# Patient Record
Sex: Female | Born: 1945 | Race: White | Hispanic: No | Smoking: Former smoker
Health system: Southern US, Community
[De-identification: ages and names within clinical notes are randomized; demographics above are authoritative.]

## PROBLEM LIST (undated history)

## (undated) DIAGNOSIS — Z8719 Personal history of other diseases of the digestive system: Secondary | ICD-10-CM

## (undated) DIAGNOSIS — R42 Dizziness and giddiness: Secondary | ICD-10-CM

## (undated) DIAGNOSIS — J449 Chronic obstructive pulmonary disease, unspecified: Secondary | ICD-10-CM

## (undated) DIAGNOSIS — R233 Spontaneous ecchymoses: Secondary | ICD-10-CM

## (undated) DIAGNOSIS — I209 Angina pectoris, unspecified: Secondary | ICD-10-CM

## (undated) DIAGNOSIS — R0600 Dyspnea, unspecified: Secondary | ICD-10-CM

## (undated) DIAGNOSIS — Z923 Personal history of irradiation: Secondary | ICD-10-CM

## (undated) DIAGNOSIS — T4145XA Adverse effect of unspecified anesthetic, initial encounter: Secondary | ICD-10-CM

## (undated) DIAGNOSIS — M1990 Unspecified osteoarthritis, unspecified site: Secondary | ICD-10-CM

## (undated) DIAGNOSIS — E785 Hyperlipidemia, unspecified: Secondary | ICD-10-CM

## (undated) DIAGNOSIS — F419 Anxiety disorder, unspecified: Secondary | ICD-10-CM

## (undated) DIAGNOSIS — D699 Hemorrhagic condition, unspecified: Secondary | ICD-10-CM

## (undated) DIAGNOSIS — J189 Pneumonia, unspecified organism: Secondary | ICD-10-CM

## (undated) DIAGNOSIS — R1310 Dysphagia, unspecified: Secondary | ICD-10-CM

## (undated) DIAGNOSIS — T8859XA Other complications of anesthesia, initial encounter: Secondary | ICD-10-CM

## (undated) DIAGNOSIS — G4730 Sleep apnea, unspecified: Secondary | ICD-10-CM

## (undated) DIAGNOSIS — K259 Gastric ulcer, unspecified as acute or chronic, without hemorrhage or perforation: Secondary | ICD-10-CM

## (undated) DIAGNOSIS — Z8619 Personal history of other infectious and parasitic diseases: Secondary | ICD-10-CM

## (undated) DIAGNOSIS — I10 Essential (primary) hypertension: Secondary | ICD-10-CM

## (undated) DIAGNOSIS — C801 Malignant (primary) neoplasm, unspecified: Secondary | ICD-10-CM

## (undated) DIAGNOSIS — IMO0002 Reserved for concepts with insufficient information to code with codable children: Secondary | ICD-10-CM

## (undated) DIAGNOSIS — R238 Other skin changes: Secondary | ICD-10-CM

## (undated) DIAGNOSIS — E119 Type 2 diabetes mellitus without complications: Secondary | ICD-10-CM

## (undated) DIAGNOSIS — K2270 Barrett's esophagus without dysplasia: Secondary | ICD-10-CM

## (undated) DIAGNOSIS — M5136 Other intervertebral disc degeneration, lumbar region: Secondary | ICD-10-CM

## (undated) DIAGNOSIS — K760 Fatty (change of) liver, not elsewhere classified: Secondary | ICD-10-CM

## (undated) DIAGNOSIS — J329 Chronic sinusitis, unspecified: Secondary | ICD-10-CM

## (undated) DIAGNOSIS — M51369 Other intervertebral disc degeneration, lumbar region without mention of lumbar back pain or lower extremity pain: Secondary | ICD-10-CM

## (undated) DIAGNOSIS — K219 Gastro-esophageal reflux disease without esophagitis: Secondary | ICD-10-CM

## (undated) DIAGNOSIS — C50919 Malignant neoplasm of unspecified site of unspecified female breast: Secondary | ICD-10-CM

## (undated) DIAGNOSIS — E7800 Pure hypercholesterolemia, unspecified: Secondary | ICD-10-CM

## (undated) DIAGNOSIS — I2510 Atherosclerotic heart disease of native coronary artery without angina pectoris: Secondary | ICD-10-CM

## (undated) HISTORY — PX: UMBILICAL HERNIA REPAIR: SHX196

## (undated) HISTORY — PX: ABDOMINAL HYSTERECTOMY: SHX81

## (undated) HISTORY — PX: UVULOPALATOPHARYNGOPLASTY: SHX827

## (undated) HISTORY — PX: CHOLECYSTECTOMY: SHX55

## (undated) HISTORY — DX: Chronic sinusitis, unspecified: J32.9

## (undated) HISTORY — PX: CORONARY ANGIOPLASTY: SHX604

---

## 1965-09-10 HISTORY — PX: LAPAROSCOPIC TUBAL LIGATION: SUR803

## 1978-09-10 HISTORY — PX: BUNIONECTOMY: SHX129

## 1983-09-11 HISTORY — PX: BREAST BIOPSY: SHX20

## 1999-09-11 HISTORY — PX: UVULOPALATOPHARYNGOPLASTY: SHX827

## 2004-07-10 ENCOUNTER — Ambulatory Visit: Payer: Self-pay | Admitting: Internal Medicine

## 2005-07-17 ENCOUNTER — Ambulatory Visit: Payer: Self-pay | Admitting: Internal Medicine

## 2005-10-23 ENCOUNTER — Ambulatory Visit: Payer: Self-pay | Admitting: Unknown Physician Specialty

## 2005-11-02 ENCOUNTER — Ambulatory Visit: Payer: Self-pay | Admitting: Unknown Physician Specialty

## 2005-12-28 ENCOUNTER — Ambulatory Visit: Payer: Self-pay | Admitting: Unknown Physician Specialty

## 2006-10-03 ENCOUNTER — Ambulatory Visit: Payer: Self-pay | Admitting: Internal Medicine

## 2007-01-09 ENCOUNTER — Ambulatory Visit: Payer: Self-pay | Admitting: Unknown Physician Specialty

## 2007-01-13 ENCOUNTER — Ambulatory Visit: Payer: Self-pay | Admitting: Unknown Physician Specialty

## 2008-01-28 ENCOUNTER — Ambulatory Visit: Payer: Self-pay | Admitting: Obstetrics and Gynecology

## 2009-02-09 ENCOUNTER — Ambulatory Visit: Payer: Self-pay | Admitting: Obstetrics and Gynecology

## 2009-05-18 ENCOUNTER — Emergency Department: Payer: Self-pay | Admitting: Unknown Physician Specialty

## 2010-02-23 ENCOUNTER — Ambulatory Visit: Payer: Self-pay | Admitting: Obstetrics and Gynecology

## 2010-03-07 ENCOUNTER — Ambulatory Visit: Payer: Self-pay | Admitting: Obstetrics and Gynecology

## 2010-09-10 DIAGNOSIS — J449 Chronic obstructive pulmonary disease, unspecified: Secondary | ICD-10-CM

## 2010-09-10 HISTORY — DX: Chronic obstructive pulmonary disease, unspecified: J44.9

## 2011-02-26 ENCOUNTER — Ambulatory Visit: Payer: Self-pay | Admitting: Obstetrics and Gynecology

## 2011-03-30 ENCOUNTER — Ambulatory Visit: Payer: Self-pay | Admitting: Unknown Physician Specialty

## 2012-02-27 ENCOUNTER — Ambulatory Visit: Payer: Self-pay | Admitting: Obstetrics and Gynecology

## 2012-08-22 ENCOUNTER — Ambulatory Visit: Payer: Self-pay | Admitting: Internal Medicine

## 2012-08-22 LAB — CREATININE, SERUM: EGFR (Non-African Amer.): >60

## 2012-09-09 ENCOUNTER — Emergency Department: Payer: Self-pay | Admitting: Emergency Medicine

## 2012-09-09 LAB — CBC
HGB: 13.3 g/dL (ref 12.0–16.0)
MCH: 30.6 pg (ref 26.0–34.0)
MCHC: 33.9 g/dL (ref 32.0–36.0)
MCV: 90 fL (ref 80–100)
Platelet: 191 10*3/uL (ref 150–440)
RBC: 4.36 10*6/uL (ref 3.80–5.20)

## 2012-09-09 LAB — URINALYSIS, COMPLETE: Protein: NEGATIVE

## 2012-09-09 LAB — BASIC METABOLIC PANEL
Anion Gap: 6 — ABNORMAL LOW (ref 7–16)
Calcium, Total: 8.3 mg/dL — ABNORMAL LOW (ref 8.5–10.1)
Chloride: 101 mmol/L (ref 98–107)
Co2: 27 mmol/L (ref 21–32)
EGFR (African American): >60
Osmolality: 274 (ref 275–301)

## 2012-09-10 DIAGNOSIS — C50919 Malignant neoplasm of unspecified site of unspecified female breast: Secondary | ICD-10-CM

## 2012-09-10 DIAGNOSIS — C801 Malignant (primary) neoplasm, unspecified: Secondary | ICD-10-CM

## 2012-09-10 DIAGNOSIS — IMO0001 Reserved for inherently not codable concepts without codable children: Secondary | ICD-10-CM

## 2012-09-10 HISTORY — PX: BREAST LUMPECTOMY: SHX2

## 2012-09-10 HISTORY — DX: Reserved for inherently not codable concepts without codable children: IMO0001

## 2012-09-10 HISTORY — DX: Malignant neoplasm of unspecified site of unspecified female breast: C50.919

## 2012-09-10 HISTORY — PX: BREAST EXCISIONAL BIOPSY: SUR124

## 2012-09-10 HISTORY — DX: Malignant (primary) neoplasm, unspecified: C80.1

## 2012-09-25 ENCOUNTER — Other Ambulatory Visit: Payer: Self-pay | Admitting: Unknown Physician Specialty

## 2012-09-25 DIAGNOSIS — Z8371 Family history of colonic polyps: Secondary | ICD-10-CM

## 2012-09-25 DIAGNOSIS — K5730 Diverticulosis of large intestine without perforation or abscess without bleeding: Secondary | ICD-10-CM

## 2012-09-25 DIAGNOSIS — Z800 Family history of malignant neoplasm of digestive organs: Secondary | ICD-10-CM

## 2013-04-27 ENCOUNTER — Ambulatory Visit: Payer: Self-pay

## 2013-05-05 ENCOUNTER — Ambulatory Visit: Payer: Self-pay

## 2013-05-07 ENCOUNTER — Ambulatory Visit: Payer: Self-pay

## 2013-05-14 ENCOUNTER — Ambulatory Visit: Payer: Self-pay | Admitting: Oncology

## 2013-05-15 LAB — PATHOLOGY REPORT

## 2013-05-28 ENCOUNTER — Ambulatory Visit: Payer: Self-pay | Admitting: Surgery

## 2013-05-28 LAB — BASIC METABOLIC PANEL
Anion Gap: 1 — ABNORMAL LOW (ref 7–16)
Calcium, Total: 8.9 mg/dL (ref 8.5–10.1)
Co2: 30 mmol/L (ref 21–32)
EGFR (African American): >60
EGFR (Non-African Amer.): >60
Potassium: 4.5 mmol/L (ref 3.5–5.1)
Sodium: 135 mmol/L — ABNORMAL LOW (ref 136–145)

## 2013-05-28 LAB — HEMOGLOBIN: HGB: 13.1 g/dL (ref 12.0–16.0)

## 2013-06-01 ENCOUNTER — Ambulatory Visit: Payer: Self-pay | Admitting: Surgery

## 2013-06-02 LAB — PATHOLOGY REPORT

## 2013-06-04 ENCOUNTER — Ambulatory Visit: Payer: Self-pay | Admitting: Surgery

## 2013-06-08 LAB — PATHOLOGY REPORT

## 2013-06-10 ENCOUNTER — Ambulatory Visit: Payer: Self-pay | Admitting: Oncology

## 2013-07-11 ENCOUNTER — Ambulatory Visit: Payer: Self-pay | Admitting: Oncology

## 2013-07-17 ENCOUNTER — Encounter: Payer: Self-pay | Admitting: Internal Medicine

## 2013-07-17 ENCOUNTER — Ambulatory Visit: Payer: Self-pay | Admitting: Specialist

## 2013-07-31 LAB — CBC CANCER CENTER
Basophil #: 0.1 x10 3/mm (ref 0.0–0.1)
Basophil %: 1 %
Eosinophil %: 3 %
HCT: 41.9 % (ref 35.0–47.0)
HGB: 13.7 g/dL (ref 12.0–16.0)
Lymphocyte %: 32.3 %
Monocyte #: 0.5 x10 3/mm (ref 0.2–0.9)
Monocyte %: 7 %
Neutrophil #: 4.3 x10 3/mm (ref 1.4–6.5)
RBC: 4.62 10*6/uL (ref 3.80–5.20)
RDW: 13.8 % (ref 11.5–14.5)
WBC: 7.6 x10 3/mm (ref 3.6–11.0)

## 2013-08-10 ENCOUNTER — Ambulatory Visit: Payer: Self-pay | Admitting: Oncology

## 2013-08-21 LAB — CBC CANCER CENTER
Basophil %: 0.9 %
Eosinophil #: 0.3 x10 3/mm (ref 0.0–0.7)
HCT: 37.4 % (ref 35.0–47.0)
HGB: 12.3 g/dL (ref 12.0–16.0)
Lymphocyte #: 1.1 x10 3/mm (ref 1.0–3.6)
Lymphocyte %: 19.3 %
MCV: 91 fL (ref 80–100)
Monocyte #: 0.4 x10 3/mm (ref 0.2–0.9)
Neutrophil #: 3.9 x10 3/mm (ref 1.4–6.5)
Neutrophil %: 68.6 %
Platelet: 184 x10 3/mm (ref 150–440)
RBC: 4.1 10*6/uL (ref 3.80–5.20)
RDW: 14.1 % (ref 11.5–14.5)
WBC: 5.7 x10 3/mm (ref 3.6–11.0)

## 2013-08-28 LAB — CBC CANCER CENTER
HCT: 38.9 % (ref 35.0–47.0)
Lymphocyte #: 1.1 x10 3/mm (ref 1.0–3.6)
Lymphocyte %: 18 %
MCHC: 33.2 g/dL (ref 32.0–36.0)
Monocyte #: 0.5 x10 3/mm (ref 0.2–0.9)
Neutrophil %: 70.9 %
Platelet: 191 x10 3/mm (ref 150–440)
RBC: 4.31 10*6/uL (ref 3.80–5.20)

## 2013-09-10 ENCOUNTER — Ambulatory Visit: Payer: Self-pay | Admitting: Oncology

## 2013-10-11 ENCOUNTER — Ambulatory Visit: Payer: Self-pay | Admitting: Oncology

## 2013-11-30 ENCOUNTER — Ambulatory Visit: Payer: Self-pay | Admitting: Oncology

## 2013-12-09 ENCOUNTER — Ambulatory Visit: Payer: Self-pay | Admitting: Oncology

## 2014-01-08 ENCOUNTER — Ambulatory Visit: Payer: Self-pay | Admitting: Oncology

## 2014-01-08 LAB — CANCER ANTIGEN 27.29: CA 27.29: 13.4 U/mL (ref 0.0–38.6)

## 2014-02-18 ENCOUNTER — Ambulatory Visit: Payer: Self-pay | Admitting: Oncology

## 2014-03-10 ENCOUNTER — Ambulatory Visit: Payer: Self-pay | Admitting: Oncology

## 2014-04-12 ENCOUNTER — Ambulatory Visit: Payer: Self-pay | Admitting: Unknown Physician Specialty

## 2014-04-13 LAB — PATHOLOGY REPORT

## 2014-05-12 ENCOUNTER — Ambulatory Visit: Payer: Self-pay | Admitting: Oncology

## 2014-06-29 ENCOUNTER — Ambulatory Visit: Payer: Self-pay | Admitting: Rheumatology

## 2014-07-09 ENCOUNTER — Ambulatory Visit: Payer: Self-pay | Admitting: Oncology

## 2014-07-11 ENCOUNTER — Ambulatory Visit: Payer: Self-pay | Admitting: Oncology

## 2014-07-27 DIAGNOSIS — M7541 Impingement syndrome of right shoulder: Secondary | ICD-10-CM | POA: Insufficient documentation

## 2014-09-16 ENCOUNTER — Ambulatory Visit: Payer: Self-pay | Admitting: Radiation Oncology

## 2014-11-11 ENCOUNTER — Ambulatory Visit
Admit: 2014-11-11 | Disposition: A | Discharge status: Home / Self Care | Payer: Self-pay | Attending: Oncology | Admitting: Oncology

## 2014-11-11 ENCOUNTER — Ambulatory Visit: Payer: Self-pay | Admitting: Oncology

## 2014-12-10 ENCOUNTER — Ambulatory Visit
Admit: 2014-12-10 | Disposition: A | Discharge status: Home / Self Care | Payer: Self-pay | Attending: Oncology | Admitting: Oncology

## 2014-12-31 NOTE — Consult Note (Signed)
Reason for Visit: This 69 year old Female patient presents to the clinic for initial evaluation of  breast cancer .   Referred by Dr. Grayland Ormond.  Diagnosis:  Chief Complaint/Diagnosis   69 year old female presented with abnormal microcalcifications of the right breast status post wide local excision and sentinel node biopsy for a stage I a (T1 B. p N1 miM0 invasive mammary carcinoma ER/PR positive HER-2/neu not overexpressed. Patient has a recurrence risk of 13 based on Oncotype DX  Pathology Report pathology report reviewed   Imaging Report mammograms reviewed   Referral Report clinical notes reviewed   Planned Treatment Regimen whole breast and peripheral lymphatic radiation   HPI   patient is a 69 year old female who presented withsuspicious spiculated mass in the upper inner quadrant of the right breast as well as fine punctate microcalcifications in the right lower inner quadrant.she had an ultrasound-guided biopsy on August 28 showing invasive mammary carcinoma ER/PR positive HER-2/neu negative. By ultrasound node measuring approximately 1 cm. She wants have a wide local excision for a 1 cm focus of invasive mammary carcinoma. 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 Margins for invasive mammary carcinoma were clear DCIS was close at 1 mm. Patient has been seen by medical oncology and based on her low Oncotype DX which was 13 they have deferred systemic chemotherapy. She will be on aromatase inhibitor after completion of radiation. She seen today in doing well. She specifically denies breast tenderness cough or bone pain at this time.  Past Hx:    rectal fissures:    Hyperlipidemia:    barrett's esophagus:    sleep apnea:    Osteoarthritis:    chronic sinusitis:    h pylori:    Diabetes:    hernia repair:    external pharyngeal repair for sleep apnea:    Tubal Ligation:    Cholecystectomy:    Hysterectomy - Total:   Past, Family and  Social History:  Past Medical History positive   Cardiovascular hyperlipidemia   Respiratory sleep apnea, chronic sinusitis   Gastrointestinal rectal fissures, H. pylori,Barrett's esophagus   Endocrine diabetes mellitus   Past Surgical History cholecystectomy; total hysterectomy, tubal ligation, external pharyngeal repair for sleep apnea, herniorrhaphy repair   Past Medical History Comments osteoarthritis   Family History positive   Family History Comments family history of adult onset diabetes, hypertension, ulcer, stroke and colon cancer   Social History noncontributory   Additional Past Medical and Surgical History seen by yourself. 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   Allergies:   Prozac: Alt Ment Status  Sulfa drugs: Headaches  Macrobid: GI Distress  Tape: Rash  Metformin HCL-Pioglitazone HCL: Other  [place] Prefilled Pen: Other  Neurontin: Other  Simvastatin: Other  Toradol: Unknown  Ceclor: Unknown  Home Meds:  Home Medications: Medication Instructions Status  acetaminophen 325 mg oral tablet 1-2 tab(s) orally every 4 hours, As needed for pain Active  Norco 325 mg-5 mg oral tablet  orally 1 to 2 every 4 hours as needed for pain Active  glipiZIDE extended release 10 mg oral tablet, extended release 1 tab(s) orally once a day (in the morning) Active  NovoLIN 70/30 human recombinant 70 units-30 units/mL subcutaneous suspension 40-50 unit(s) subcutaneous once a day (in the morning) at 1000. Then 30 units at 5PM Active  PARoxetine 40 mg oral tablet 1 tab(s) orally once a day (in the morning) Active  omeprazole 20 mg oral delayed release  capsule 1 cap(s) orally 2 times a day Active  benazepril 20 mg oral tablet 1 tab(s) orally once a day (in the morning) Active  loratadine 10 mg oral tablet 1 tab(s) orally once a day (in the morning) Active   Review of Systems:  General negative   Performance Status (ECOG) 0   Skin negative    Breast see HPI   Ophthalmologic negative   ENMT negative   Respiratory and Thorax negative   Cardiovascular negative   Gastrointestinal negative   Genitourinary negative   Musculoskeletal negative   Neurological negative   Psychiatric negative   Hematology/Lymphatics negative   Endocrine negative   Allergic/Immunologic negative   Review of Systems   review of systems obtained by nursing  Nursing Notes:  Nursing Vital Signs and Chemo Nursing Nursing Notes: *CC Vital Signs Flowsheet:   23-Oct-14 11:25  Temp Temperature 97.6  Pulse Pulse 92  Respirations Respirations 18  SBP SBP 131  DBP DBP 77  Pain Scale (0-10)  0  Current Weight (kg) (kg) 97.8  Height (cm) centimeters 163  BSA (m2) 2   Physical Exam:  General/Skin/HEENT:  Skin normal   Eyes normal   ENMT normal   Head and Neck normal   Additional PE well-developed slightly obese female in NAD. Lungs are clear to A&P cardiac examination shows regular rate and rhythm. She has a wide local excision site of the right breast which is healing well. There is some retraction of the breast towards the scar. No dominant mass or nodularity is noted in either breast into position examined. No axillary or supraclavicular adenopathy is identified.   Breasts/Resp/CV/GI/GU:  Respiratory and Thorax normal   Cardiovascular normal   Gastrointestinal normal   Genitourinary normal   MS/Neuro/Psych/Lymph:  Musculoskeletal normal   Neurological normal   Lymphatics normal   Other Results:  Radiology Results: LabUnknown:    26-Aug-14 14:45, Digital Additional Views Rt Breast (SCR)  PACS Image     26-Aug-14 16:28, MAM Ultrasound  Right  PACS [place]:    26-Aug-14 14:45, Digital Additional Views Rt Breast (SCR)  Digital Additional Views Rt Breast (SCR)   REASON FOR EXAM:    av rt calcifications  COMMENTS:       PROCEDURE: MAM - MAM DIG ADDVIEWS RT SCR  - May 05 2013  2:45PM      *RADIOLOGY REPORT*    Clinical Data:  The patient returns for evaluation of  calcifications in the medial aspect of the right breast noted on  recent screening study dated 04/27/2013.    DIGITAL DIAGNOSTIC RIGHT MAMMOGRAM  AND RIGHT BREAST ULTRASOUND:    Comparison:  02/27/2012, 02/26/2011, 02/23/2010, 02/09/2009,  01/28/2008  Findings:    ACR Breast Density Category b:  There are scattered areas of  fibroglandular density.    Magnification views demonstrate fine punctate microcalcifications  in the right lower inner quadrant.     In addition, there is a spiculated mass in the right upper inner  quadrant.    On physical exam, no mass is palpated in the medial half of the  right breast.    Ultrasound is performed, showing an irregular solid mass at 2  o'clock 5 cm from the right nipple measuring 0.8 x 0.8 x 1.2 cm.  No abnormal lymph nodes are noted in the right axilla.    The spiculated mass is highly suspicious for invasive mammary  carcinoma.  I would suggest ultrasound-guided core needle biopsy of  this  mass.  If this biopsy is positive for carcinoma, one may wish  to consider breast MRI and possible stereotactic core needle biopsy  of the calcifications.     IMPRESSION:    1.  Suspicious spiculated mass right upper inner quadrant.  2.  Fine punctate microcalcifications in the right lower inner  quadrant.  RECOMMENDATION:  Suggest ultrasound-guided core needle biopsy of the mass.  This has  been scheduled for a convenient time for the patient.  Pending  those results, breast MRI and possible stereotactic core needle  biopsy may be helpful.    I have discussed the findings and recommendations with thepatient.  Results were also provided in writing at the conclusion of the  visit.  If applicable, a reminder letter will be sent to the  patient regarding the next appointment.    BI-RADS CATEGORY 5:  Highly suggestive of malignancy - appropriate  action should  be taken.    Original Report Authenticated By: Ulyess Blossom, M.D.         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 Grayland Ormond. He is deferring chemotherapy based on Oncotype DX low risk of recurrence. Patient has had only sentinel node which had micrometastatic so I would plan on delivering 5000 cGy to her right breast and peripheral lymphatics. Also boost or scar another 2000 cGy based on the close margin. Risks and benefits of treatment early skin reaction, fatigue, inclusion of some superficial lung and possible alteration of blood counts were all explained to the patient in detail. I've also explained to her the need to exercise her right upper extremity based on that we will be including her peripheral lymphatics and our treatment field. Patient [place] treatment plan well I have set her up for CT simulation next week. Patient will also be a candidate for aromatase inhibitor after completion of radiation.  I would like to take this opportunity to thank you for allowing me to continue to participate in this patient's care.  CC Referral:  cc: Dr. Tamala Julian, Dr. Ola Spurr   Electronic Signatures: Baruch Gouty, Roda Shutters (MD)  (Signed 23-Oct-14 12:24)  Authored: HPI, Diagnosis, Past Hx, PFSH, Allergies, Home Meds, ROS, Nursing Notes, Physical Exam, Other Results, Relevent Results, Encounter Assessment and Plan, CC Referring Physician   Last Updated: 23-Oct-14 12:24 by Armstead Peaks (MD)

## 2014-12-31 NOTE — Op Note (Signed)
PATIENT NAME:  Tammy Hatfield, Tammy Hatfield MR#:  [id number] DATE OF BIRTH:  February 24, 1946  DATE OF PROCEDURE:  06/04/2013  PREOPERATIVE DIAGNOSIS: Carcinoma of the right breast.   POSTOPERATIVE DIAGNOSIS: Carcinoma of the right breast.   PROCEDURE: Right partial mastectomy with axillary sentinel lymph node biopsy.   SURGEON: Loreli Dollar, MD  ANESTHESIA: General.   INDICATIONS: This 69 year old female had recent mammogram demonstrating a mass at the upper inner quadrant of the right breast. She had biopsy findings of infiltrating mammary carcinoma. She also had some other adjacent microcalcifications. She also had a more recent biopsy of another cluster of microcalcifications in the inferior aspect of the right breast which was benign. She had preoperative injection of radioactive technetium sulfur colloid, preoperative x-ray needle localization marking the cancer biopsy site and some adjacent microcalcifications, so that 2 Kopans wires were used as a bracket.   The patient was placed on the operating table in the supine position under general anesthesia. The dressing was removed from the upper inner quadrant of the right breast, exposing the 2 Kopans wires. These wires were cut 2 cm from the skin. The right arm was placed on a lateral arm support. Some adhesive was removed from the skin. The breast and axilla were prepared with ChloraPrep and draped in a sterile manner. Mammograms were reviewed prior to beginning surgery.   The gamma counter was used to demonstrate the location of radioactivity in the inferior aspect of the right axilla. An oblique incision was made some 5 cm in length in the inferior aspect of the axilla and carried down through subcutaneous tissues. Two traversing veins were divided between 4-0 chromic suture ligatures and also 1 traversing small artery was divided between 4-0 chromic suture ligatures. Electrocautery was used to control several small bleeding points. The superficial  fascia was incised. The gamma counter was used to direct the dissection, and dissected down deeply within the axilla adjacent to the ribcage and encountered a lymph node which was found to have radioactivity. This lymph node was approximately 1 cm in size and was soft and was dissected free from surrounding structures along with some surrounding fatty material. The ex vivo count was in the range of 1800 to 1900 counts per second. The background count was less than 40 counts per second. There was no remaining palpable mass within the axilla. The sentinel lymph node was sent for routine pathology. Moist sponge was placed into the axilla.   Attention was turned to do the right partial mastectomy. The Kopans wires were observed and again reviewed the mammogram images. A curvilinear incision was made in the upper inner quadrant of the right breast from approximately 12 o'clock to 3 o'clock position. The 3 o'clock end of the skin ellipse was tagged with a 3-0 nylon suture for the pathologist's orientation. The tips of the ellipse were approximately 4 cm from the border of the areola. The dissection was carried down through subcutaneous tissues and encountered the wires and dissected out a portion of breast tissue surrounding both wires. The wires were approximately 2 cm apart, and as I dissected down around the wires, I could began to feel some firmness at the biopsy site, and palpating this firm area helped to direct the dissection, and dissection was carried down deeply within the breast to just short of the deep fascia. The specimen was oriented with margin map suturing markers to the specimen, marking the cranial, caudal, medial, lateral and deep margins. It was noted that the inferior  aspect of the excision did encounter the other biopsy site in the more inferior aspect of the breast. The specimen was submitted for specimen mammogram and pathology to check for margins and to do routine pathology.   Next,  attention was turned back to the right axilla, where the wound was inspected. Hemostasis was intact, and the wound was closed with a running 5-0 Monocryl subcuticular suture.   Next, the partial mastectomy incision was further inspected. Hemostasis was intact. There was no palpable mass within the wound. The subcutaneous tissues were closed with inverted interrupted 3-0 chromic, and the skin was closed with a running 4-0 Monocryl subcuticular suture.   I awaited the pathologist's and radiologist's calls. The radiologist called to indicate that the biopsy sites and the calcifications were seen in the specimen mammogram. The pathologist later called to indicate that the margins appeared to be clear. Subsequently, the wounds were dressed with Dermabond and allowed to dry. The patient was then prepared for transfer to the recovery room.  ____________________________ Lenna Sciara. Rochel Brome, MD jws:OSi D: 06/04/2013 13:13:28 ET T: 06/04/2013 13:35:49 ET JOB#: [id number]  cc: Loreli Dollar, MD, <Dictator> Loreli Dollar MD ELECTRONICALLY SIGNED 06/05/2013 17:58

## 2015-02-01 ENCOUNTER — Encounter
Admission: RE | Admit: 2015-02-01 | Discharge: 2015-02-01 | Disposition: A | Discharge status: Home / Self Care | Payer: Medicare Other | Source: Ambulatory Visit | Attending: Orthopedic Surgery | Admitting: Orthopedic Surgery

## 2015-02-01 DIAGNOSIS — I10 Essential (primary) hypertension: Secondary | ICD-10-CM | POA: Insufficient documentation

## 2015-02-01 DIAGNOSIS — E119 Type 2 diabetes mellitus without complications: Secondary | ICD-10-CM | POA: Diagnosis not present

## 2015-02-01 DIAGNOSIS — Z01812 Encounter for preprocedural laboratory examination: Secondary | ICD-10-CM | POA: Diagnosis not present

## 2015-02-01 DIAGNOSIS — E785 Hyperlipidemia, unspecified: Secondary | ICD-10-CM | POA: Diagnosis not present

## 2015-02-01 HISTORY — DX: Other intervertebral disc degeneration, lumbar region: M51.36

## 2015-02-01 HISTORY — DX: Pure hypercholesterolemia, unspecified: E78.00

## 2015-02-01 HISTORY — DX: Type 2 diabetes mellitus without complications: E11.9

## 2015-02-01 HISTORY — DX: Dysphagia, unspecified: R13.10

## 2015-02-01 HISTORY — DX: Unspecified osteoarthritis, unspecified site: M19.90

## 2015-02-01 HISTORY — DX: Personal history of other infectious and parasitic diseases: Z86.19

## 2015-02-01 HISTORY — DX: Gastro-esophageal reflux disease without esophagitis: K21.9

## 2015-02-01 HISTORY — DX: Essential (primary) hypertension: I10

## 2015-02-01 HISTORY — DX: Barrett's esophagus without dysplasia: K22.70

## 2015-02-01 HISTORY — DX: Other intervertebral disc degeneration, lumbar region without mention of lumbar back pain or lower extremity pain: M51.369

## 2015-02-01 HISTORY — DX: Chronic obstructive pulmonary disease, unspecified: J44.9

## 2015-02-01 HISTORY — DX: Gastric ulcer, unspecified as acute or chronic, without hemorrhage or perforation: K25.9

## 2015-02-01 HISTORY — DX: Hyperlipidemia, unspecified: E78.5

## 2015-02-01 HISTORY — DX: Malignant (primary) neoplasm, unspecified: C80.1

## 2015-02-01 LAB — DIFFERENTIAL
BASOS ABS: 0.1 10*3/uL (ref 0–0.1)
Basophils Relative: 1 %
EOS PCT: 3 %
Eosinophils Absolute: 0.2 10*3/uL (ref 0–0.7)
Lymphocytes Relative: 29 %
Lymphs Abs: 2.1 10*3/uL (ref 1.0–3.6)
MONO ABS: 0.5 10*3/uL (ref 0.2–0.9)
Monocytes Relative: 7 %
NEUTROS ABS: 4.4 10*3/uL (ref 1.4–6.5)
Neutrophils Relative %: 60 %

## 2015-02-01 LAB — BASIC METABOLIC PANEL
Anion gap: 7 (ref 5–15)
BUN: 12 mg/dL (ref 6–20)
CHLORIDE: 100 mmol/L — AB (ref 101–111)
CO2: 29 mmol/L (ref 22–32)
Calcium: 8.4 mg/dL — ABNORMAL LOW (ref 8.9–10.3)
Creatinine, Ser: 0.75 mg/dL (ref 0.44–1.00)
GFR calc Af Amer: >60 mL/min (ref >60)
GFR calc non Af Amer: >60 mL/min (ref >60)
Glucose, Bld: 143 mg/dL — ABNORMAL HIGH (ref 65–99)
Potassium: 4.2 mmol/L (ref 3.5–5.1)
Sodium: 136 mmol/L (ref 135–145)

## 2015-02-01 LAB — CBC
HCT: 37.9 % (ref 35.0–47.0)
HEMOGLOBIN: 12.7 g/dL (ref 12.0–16.0)
MCH: 29.8 pg (ref 26.0–34.0)
MCHC: 33.5 g/dL (ref 32.0–36.0)
MCV: 88.9 fL (ref 80.0–100.0)
PLATELETS: 189 10*3/uL (ref 150–440)
RBC: 4.27 MIL/uL (ref 3.80–5.20)
RDW: 14.2 % (ref 11.5–14.5)
WBC: 7.3 10*3/uL (ref 3.6–11.0)

## 2015-02-01 NOTE — Patient Instructions (Signed)
  Your procedure is scheduled on: February 09, 2015 Report to [facility]. To find out your arrival time please call [phone] between 1PM - 3PM on Feb 07, 2025.  Remember: Instructions that are not followed completely may result in serious medical risk, up to and including death, or upon the discretion of your surgeon and anesthesiologist your surgery may need to be rescheduled.    _x___ 1. Do not eat food or drink liquids after midnight. No gum chewing or hard candies.     __x__ 2. No Alcohol for 24 hours before or after surgery.   ____ 3. Bring all medications with you on the day of surgery if instructed.    __x__ 4. Notify your doctor if there is any change in your medical condition     (cold, fever, infections).     Do not wear jewelry, make-up, hairpins, clips or nail polish.  Do not wear lotions, powders, or perfumes. You may wear deodorant.  Do not shave 48 hours prior to surgery. Men may shave face and neck.  Do not bring valuables to the hospital.    [facility] is not responsible for any belongings or valuables.               Contacts, dentures or bridgework may not be worn into surgery.  Leave your suitcase in the car. After surgery it may be brought to your room.  For patients admitted to the hospital, discharge time is determined by your                treatment team.   Patients discharged the day of surgery will not be allowed to drive home.   Please read over the following fact sheets that you were given:   [facility] preparing for Surgery   __x__ Take these medicines the morning of surgery with A SIP OF WATER:    1. omeprazole (PRILOSEC)  2. losartan (COZAAR)  3. PARoxetine (PAXIL)     ____ Fleet Enema (as directed)   __x__ Use CHG Soap as directed  _x___ Use inhalers on the day of surgery and bring to hospital  ____ Stop metformin 2 days prior to surgery    __x__ Take 1/2 of usual insulin dose the night before surgery and none on the morning of  surgery.   ____ Stop Coumadin/Plavix/aspirin on does not apply, pt does not take.  ____ Stop Anti-inflammatories on does not apply, pt does not take.   ____ Stop supplements until after surgery.    __x__ Bring C-Pap to the hospital.

## 2015-02-09 ENCOUNTER — Ambulatory Visit: Payer: Medicare Other | Admitting: Anesthesiology

## 2015-02-09 ENCOUNTER — Ambulatory Visit
Admission: RE | Admit: 2015-02-09 | Discharge: 2015-02-09 | Disposition: A | Discharge status: Home / Self Care | Payer: Medicare Other | Source: Ambulatory Visit | Attending: Unknown Physician Specialty | Admitting: Unknown Physician Specialty

## 2015-02-09 ENCOUNTER — Encounter: Payer: Self-pay | Admitting: *Deleted

## 2015-02-09 ENCOUNTER — Encounter
Admission: RE | Disposition: A | Discharge status: Home / Self Care | Payer: Self-pay | Source: Ambulatory Visit | Attending: Unknown Physician Specialty

## 2015-02-09 DIAGNOSIS — M519 Unspecified thoracic, thoracolumbar and lumbosacral intervertebral disc disorder: Secondary | ICD-10-CM

## 2015-02-09 DIAGNOSIS — K219 Gastro-esophageal reflux disease without esophagitis: Secondary | ICD-10-CM

## 2015-02-09 DIAGNOSIS — Z794 Long term (current) use of insulin: Secondary | ICD-10-CM | POA: Insufficient documentation

## 2015-02-09 DIAGNOSIS — K2270 Barrett's esophagus without dysplasia: Secondary | ICD-10-CM

## 2015-02-09 DIAGNOSIS — G4730 Sleep apnea, unspecified: Secondary | ICD-10-CM | POA: Insufficient documentation

## 2015-02-09 DIAGNOSIS — E780 Pure hypercholesterolemia: Secondary | ICD-10-CM

## 2015-02-09 DIAGNOSIS — I10 Essential (primary) hypertension: Secondary | ICD-10-CM | POA: Insufficient documentation

## 2015-02-09 DIAGNOSIS — E785 Hyperlipidemia, unspecified: Secondary | ICD-10-CM | POA: Insufficient documentation

## 2015-02-09 DIAGNOSIS — Z79899 Other long term (current) drug therapy: Secondary | ICD-10-CM

## 2015-02-09 DIAGNOSIS — I5031 Acute diastolic (congestive) heart failure: Secondary | ICD-10-CM | POA: Diagnosis not present

## 2015-02-09 DIAGNOSIS — Z87891 Personal history of nicotine dependence: Secondary | ICD-10-CM

## 2015-02-09 DIAGNOSIS — M7541 Impingement syndrome of right shoulder: Secondary | ICD-10-CM

## 2015-02-09 DIAGNOSIS — M75101 Unspecified rotator cuff tear or rupture of right shoulder, not specified as traumatic: Secondary | ICD-10-CM

## 2015-02-09 DIAGNOSIS — E119 Type 2 diabetes mellitus without complications: Secondary | ICD-10-CM | POA: Insufficient documentation

## 2015-02-09 DIAGNOSIS — N289 Disorder of kidney and ureter, unspecified: Secondary | ICD-10-CM | POA: Diagnosis not present

## 2015-02-09 HISTORY — PX: SHOULDER ARTHROSCOPY WITH ROTATOR CUFF REPAIR: SHX5685

## 2015-02-09 LAB — GLUCOSE, CAPILLARY
GLUCOSE-CAPILLARY: 207 mg/dL — AB (ref 65–99)
Glucose-Capillary: 195 mg/dL — ABNORMAL HIGH (ref 65–99)

## 2015-02-09 SURGERY — ARTHROSCOPY, SHOULDER, WITH ROTATOR CUFF REPAIR
Anesthesia: General | Laterality: Right | Wound class: Clean

## 2015-02-09 MED ORDER — CLINDAMYCIN PHOSPHATE 600 MG/50ML IV SOLN
INTRAVENOUS | Status: AC
  Filled 2015-02-09: qty 50

## 2015-02-09 MED ORDER — HYDROMORPHONE HCL 1 MG/ML IJ SOLN
INTRAMUSCULAR | Status: AC
  Filled 2015-02-09: qty 1

## 2015-02-09 MED ORDER — KETOROLAC TROMETHAMINE 15 MG/ML IJ SOLN: 15.0000 mg | Freq: Once | INTRAMUSCULAR | Status: DC

## 2015-02-09 MED ORDER — CLINDAMYCIN PHOSPHATE 600 MG/50ML IV SOLN
INTRAVENOUS | Status: DC | PRN
  Administered 2015-02-09: 600 mg via INTRAVENOUS

## 2015-02-09 MED ORDER — KETOROLAC TROMETHAMINE 30 MG/ML IJ SOLN
30.0000 mg | Freq: Once | INTRAMUSCULAR | Status: DC
  Filled 2015-02-09: qty 1

## 2015-02-09 MED ORDER — MIDAZOLAM HCL 2 MG/2ML IJ SOLN
INTRAMUSCULAR | Status: DC | PRN
  Administered 2015-02-09 (×2): 1 mg via INTRAVENOUS

## 2015-02-09 MED ORDER — SODIUM CHLORIDE 0.9 % IV SOLN
INTRAVENOUS | Status: DC
  Administered 2015-02-09 (×2): via INTRAVENOUS

## 2015-02-09 MED ORDER — FENTANYL CITRATE (PF) 100 MCG/2ML IJ SOLN
INTRAMUSCULAR | Status: AC
  Filled 2015-02-09: qty 2

## 2015-02-09 MED ORDER — FENTANYL CITRATE (PF) 100 MCG/2ML IJ SOLN
INTRAMUSCULAR | Status: DC | PRN
  Administered 2015-02-09: 100 ug via INTRAVENOUS
  Administered 2015-02-09 (×2): 50 ug via INTRAVENOUS

## 2015-02-09 MED ORDER — ROCURONIUM BROMIDE 100 MG/10ML IV SOLN
INTRAVENOUS | Status: DC | PRN
Start: 2015-02-09 — End: 2015-02-09
  Administered 2015-02-09: 10 mg via INTRAVENOUS
  Administered 2015-02-09: 40 mg via INTRAVENOUS
  Administered 2015-02-09: 10 mg via INTRAVENOUS
  Administered 2015-02-09: 20 mg via INTRAVENOUS

## 2015-02-09 MED ORDER — OXYCODONE-ACETAMINOPHEN 5-325 MG PO TABS
ORAL_TABLET | ORAL | Status: AC
  Filled 2015-02-09: qty 2

## 2015-02-09 MED ORDER — EPINEPHRINE HCL 1 MG/ML IJ SOLN
INTRAMUSCULAR | Status: AC
  Filled 2015-02-09: qty 1

## 2015-02-09 MED ORDER — BUPIVACAINE HCL (PF) 0.5 % IJ SOLN
INTRAMUSCULAR | Status: AC
  Filled 2015-02-09: qty 30

## 2015-02-09 MED ORDER — BUPIVACAINE HCL (PF) 0.5 % IJ SOLN
INTRAMUSCULAR | Status: DC | PRN
  Administered 2015-02-09: 30 mL

## 2015-02-09 MED ORDER — NEOSTIGMINE METHYLSULFATE 10 MG/10ML IV SOLN
INTRAVENOUS | Status: DC | PRN
  Administered 2015-02-09: 5 mg via INTRAVENOUS

## 2015-02-09 MED ORDER — PHENYLEPHRINE HCL 10 MG/ML IJ SOLN
INTRAMUSCULAR | Status: DC | PRN
  Administered 2015-02-09 (×2): 200 ug via INTRAVENOUS
  Administered 2015-02-09: 100 ug via INTRAVENOUS
  Administered 2015-02-09: 200 ug via INTRAVENOUS
  Administered 2015-02-09: 100 ug via INTRAVENOUS
  Administered 2015-02-09: 200 ug via INTRAVENOUS
  Administered 2015-02-09: 100 ug via INTRAVENOUS
  Administered 2015-02-09 (×2): 200 ug via INTRAVENOUS

## 2015-02-09 MED ORDER — FENTANYL CITRATE (PF) 100 MCG/2ML IJ SOLN
25.0000 ug | INTRAMUSCULAR | Status: DC | PRN
  Administered 2015-02-09 (×4): 25 ug via INTRAVENOUS

## 2015-02-09 MED ORDER — PROPOFOL 10 MG/ML IV BOLUS
INTRAVENOUS | Status: DC | PRN
  Administered 2015-02-09: 150 mg via INTRAVENOUS

## 2015-02-09 MED ORDER — ONDANSETRON HCL 4 MG/2ML IJ SOLN
INTRAMUSCULAR | Status: DC | PRN
  Administered 2015-02-09: 4 mg via INTRAVENOUS

## 2015-02-09 MED ORDER — ONDANSETRON HCL 4 MG/2ML IJ SOLN: 4.0000 mg | Freq: Once | INTRAMUSCULAR | Status: DC | PRN

## 2015-02-09 MED ORDER — EPINEPHRINE HCL 1 MG/ML IJ SOLN
INTRAMUSCULAR | Status: DC | PRN
  Administered 2015-02-09: 1 mg

## 2015-02-09 MED ORDER — OXYCODONE-ACETAMINOPHEN 5-325 MG PO TABS
1.0000 | ORAL_TABLET | ORAL | Status: DC | PRN
  Administered 2015-02-09: 2 via ORAL

## 2015-02-09 MED ORDER — LIDOCAINE HCL (CARDIAC) 20 MG/ML IV SOLN
INTRAVENOUS | Status: DC | PRN
  Administered 2015-02-09: 80 mg via INTRAVENOUS

## 2015-02-09 MED ORDER — ROXICET 5-325 MG PO TABS: 1.0000 | ORAL_TABLET | ORAL | Status: DC | PRN

## 2015-02-09 MED ORDER — HYDROMORPHONE HCL 1 MG/ML IJ SOLN
0.2500 mg | INTRAMUSCULAR | Status: DC | PRN
  Administered 2015-02-09: 0.25 mg via INTRAVENOUS

## 2015-02-09 MED ORDER — KETOROLAC TROMETHAMINE 60 MG/2ML IM SOLN
INTRAMUSCULAR | Status: AC
Start: 2015-02-09 — End: 2015-02-09
  Administered 2015-02-09: 30 mg via INTRAVENOUS
  Filled 2015-02-09: qty 2

## 2015-02-09 MED ORDER — GLYCOPYRROLATE 0.2 MG/ML IJ SOLN
INTRAMUSCULAR | Status: DC | PRN
  Administered 2015-02-09: .8 mg via INTRAVENOUS

## 2015-02-09 SURGICAL SUPPLY — 77 items
ADAPTER IRRIG TUBE 2 SPIKE SOL (ADAPTER) ×6 IMPLANT
ANCHOR SUT 5.5 SPEEDSCREW (Screw) ×3 IMPLANT
ARTHROSCOPIC SHAVER BLADE 4.0 MM RESECTOR ×2 IMPLANT
ARTHROSCOPIC SHAVER BLADE 5.5MM ROUND BUR ×3 IMPLANT
ARTHROWAND PARAGON T2 (SURGICAL WAND)
BLADE SURG 15 STRL LF DISP TIS (BLADE) IMPLANT
BLADE SURG 15 STRL SS (BLADE)
BUR BR 5.5 12 FLUTE (BURR) ×3 IMPLANT
BUR HOODED 3.0 ABRASION (BURR) IMPLANT
BUR RADIUS 4.0X18.5 (BURR) ×3 IMPLANT
BUR RADIUS 5.5 (BURR) IMPLANT
CANNULA 8.5X75 THRED (CANNULA) ×3 IMPLANT
CANNULA SHAVER 8MMX76MM (CANNULA) IMPLANT
CAP LOCK ULTRA CANNULA (MISCELLANEOUS) IMPLANT
CUTTER CANN W/HOLE 4.5 (CUTTER) IMPLANT
CUTTER SLOTTED WHISKER 4.0 (BURR) IMPLANT
DRAPE STERI 35X30 U-POUCH (DRAPES) ×3 IMPLANT
DURAPREP 26ML APPLICATOR (WOUND CARE) ×6 IMPLANT
GAUZE SPONGE 4X4 12PLY STRL (GAUZE/BANDAGES/DRESSINGS) ×3 IMPLANT
GLOVE BIO SURGEON STRL SZ7.5 (GLOVE) ×3 IMPLANT
GLOVE BIO SURGEON STRL SZ8 (GLOVE) ×3 IMPLANT
GLOVE INDICATOR 8.0 STRL GRN (GLOVE) ×3 IMPLANT
GOWN STRL REIN 2XL LVL4 (GOWN DISPOSABLE) ×3 IMPLANT
GOWN STRL REUS W/ TWL LRG LVL3 (GOWN DISPOSABLE) ×1 IMPLANT
GOWN STRL REUS W/TWL LRG LVL3 (GOWN DISPOSABLE) ×2
IV LACTATED RINGER IRRG 3000ML (IV SOLUTION) ×8
IV LR IRRIG 3000ML ARTHROMATIC (IV SOLUTION) ×4 IMPLANT
KIT SHOULDER TRACTION (DRAPES) ×3 IMPLANT
MANIFOLD 4PT FOR NEPTUNE1 (MISCELLANEOUS) ×3 IMPLANT
NEEDLE 18GX1X1/2 (RX/OR ONLY) (NEEDLE) IMPLANT
NEEDLE MAYO CATGUT SZ 1.5 (NEEDLE)
NEEDLE MAYO CATGUT SZ 2 (NEEDLE) IMPLANT
NEEDLE SPNL 18GX3.5 QUINCKE PK (NEEDLE) IMPLANT
PACK ARTHROSCOPY SHOULDER (MISCELLANEOUS) ×3 IMPLANT
PAD GROUND ADULT SPLIT (MISCELLANEOUS) ×3 IMPLANT
PASSER SUT CAPTURE FIRST (SUTURE) ×3 IMPLANT
SET TUBE SUCT SHAVER OUTFL 24K (TUBING) ×3 IMPLANT
SLING ULTRA II M (MISCELLANEOUS) ×3 IMPLANT
SOL PREP PVP 2OZ (MISCELLANEOUS)
SOLUTION PREP PVP 2OZ (MISCELLANEOUS) IMPLANT
STAPLER SKIN PROX 35W (STAPLE) IMPLANT
SUT ETHIBOND NAB CT1 #1 30IN (SUTURE) IMPLANT
SUT ETHILON 3-0 (SUTURE) ×6 IMPLANT
SUT ETHILON 3-0 FS-10 30 BLK (SUTURE)
SUT MAGNUM WIRE 2 (SUTURE) IMPLANT
SUT PDS AB 1 CT1 27 (SUTURE) IMPLANT
SUT PDSII 0 (SUTURE) IMPLANT
SUT PERFECTPASSER WHITE CART (SUTURE) IMPLANT
SUT PROLENE 2 0 CT2 30 (SUTURE) IMPLANT
SUT SMART STITCH CARTRIDGE (SUTURE) IMPLANT
SUT TICRON 2-0 30IN 311381 (SUTURE) IMPLANT
SUT VIC AB 0 CT1 36 (SUTURE) IMPLANT
SUT VIC AB 0 CT2 27 (SUTURE) IMPLANT
SUT VIC AB 2-0 CT1 27 (SUTURE)
SUT VIC AB 2-0 CT1 TAPERPNT 27 (SUTURE) IMPLANT
SUT VIC AB 2-0 CT2 27 (SUTURE) IMPLANT
SUT VIC AB 2-0 SH 27 (SUTURE)
SUT VIC AB 2-0 SH 27XBRD (SUTURE) IMPLANT
SUT VIC AB 3-0 SH 27 (SUTURE)
SUT VIC AB 3-0 SH 27X BRD (SUTURE) IMPLANT
SUTURE EHLN 3-0 FS-10 30 BLK (SUTURE) IMPLANT
SUTURE MAGNUM WIRE 2X48 BLK (SUTURE) ×3 IMPLANT
SYRINGE 10CC LL (SYRINGE) IMPLANT
TAPE MICROFOAM 4IN (TAPE) ×3 IMPLANT
TUBING ARTHRO INFLOW-ONLY STRL (TUBING) ×3 IMPLANT
WAND 30 DEG SABER W/CORD (SURGICAL WAND) IMPLANT
WAND ARTHRO PARAGON T2 (SURGICAL WAND) IMPLANT
WAND COVAC 50 IFS (MISCELLANEOUS) IMPLANT
WAND COVATOR 20 (MISCELLANEOUS) ×6 IMPLANT
WAND HAND CNTRL MULTIVAC 50 (MISCELLANEOUS) IMPLANT
WAND HAND CNTRL MULTIVAC 90 (MISCELLANEOUS) ×3 IMPLANT
WAND MEGAVAC 90 (MISCELLANEOUS) IMPLANT
WAND TENDON TOPAZ 0 ANGL (MISCELLANEOUS) IMPLANT
WAND TOPAZ EPF  WAS Q (MISCELLANEOUS)
WAND TOPAZ EPF WAS Q (MISCELLANEOUS) IMPLANT
WIRE MAGNUM (SUTURE) IMPLANT
WRAP SHOULDER HOT/COLD PACK (SOFTGOODS) ×3 IMPLANT

## 2015-02-09 NOTE — Discharge Instructions (Addendum)
General Anesthesia, Care After Refer to this sheet in the next few weeks. These instructions provide you with information on caring for yourself after your procedure. Your health care provider may also give you more specific instructions. Your treatment has been planned according to current medical practices, but problems sometimes occur. Call your health care provider if you have any problems or questions after your procedure. WHAT TO EXPECT AFTER THE PROCEDURE After the procedure, it is typical to experience:  Sleepiness.  Nausea and vomiting. HOME CARE INSTRUCTIONS  For the first 24 hours after general anesthesia:  Have a responsible person with you.  Do not drive a car. If you are alone, do not take public transportation.  Do not drink alcohol.  Do not take medicine that has not been prescribed by your health care provider.  Do not sign important papers or make important decisions.  You may resume a normal diet and activities as directed by your health care provider.  Change bandages (dressings) as directed.  If you have questions or problems that seem related to general anesthesia, call the hospital and ask for the anesthetist or anesthesiologist on call. SEEK MEDICAL CARE IF:  You have nausea and vomiting that continue the day after anesthesia.  You develop a rash. SEEK IMMEDIATE MEDICAL CARE IF:   You have difficulty breathing.  You have chest pain.  You have any allergic problems.  Document Released: 12/03/2000 Document Revised: 09/01/2013 Document Reviewed: 03/12/2013 [facility] Patient Information 2015 [place], [place]. This information is not intended to replace advice given to you by your health care provider. Make sure you discuss any questions you have with your health care provider. Diet: As you were doing prior to hospitalization   Shower:  Delay until sutures removed  Dressing: leave dressing intact  Activity:  Increase activity slowly as tolerated.     Sling:leave on until seen in office   RTC: 1 week  Ice pack as needed   To prevent constipation: you may use a stool softener such as - Miralax (over the counter) for constipation as needed.    To prevent venous clotting Take one 81 mg. ASA tablet  2X per day for about 2 weeks post surgery.  Precautions:  If you experience chest pain or shortness of breath - call 911 immediately for transfer to the hospital emergency department!!  If you develop a fever greater that 101 F, purulent drainage from wound, increased redness or drainage from wound, or calf pain -- Call the office at [phone]                                             Follow- Up Appointment:  Please call for an appointment to be seen in 1 wk.      AMBULATORY SURGERY  DISCHARGE INSTRUCTIONS   1) The drugs that you were given will stay in your system until tomorrow so for the next 24 hours you should not:  A) Drive an automobile B) Make any legal decisions C) Drink any alcoholic beverage   2) You may resume regular meals tomorrow.  Today it is better to start with liquids and gradually work up to solid foods.  You may eat anything you prefer, but it is better to start with liquids, then soup and crackers, and gradually work up to solid foods.   3) Please notify your doctor immediately if  you have any unusual bleeding, trouble breathing, redness and pain at the surgery site, drainage, fever, or pain not relieved by medication. 4)   5) Your post-operative visit with Dr.                                     is: Date:                        Time:    Please call to schedule your post-operative visit.  6) Additional Instructions: 7)

## 2015-02-09 NOTE — OR Nursing (Signed)
Blood sugar this am is 207.

## 2015-02-09 NOTE — H&P (Signed)
  H and P reviewed. No changes. Uploaded at later date. 

## 2015-02-09 NOTE — Op Note (Signed)
02/09/2015  10:28 AM  Patient:   Tammy Hatfield  Pre-Op Diagnosis:   IMPINGEMENT SYNDROME right shoulder with long head of the biceps tendinopathy plus small cuff tear  Postoperative diagnosis: Impingement/tendinopathy with rotator cuff tear and biceps tendinopathy right shoulder.  Procedure: arthroscopic subacromial decompression plus release of the long head of biceps tendon followed by arthroscopic rotator cuff repair  Anesthesia: General endotracheal  Findings: As above  Complications: None  Estimated blood loss: negligable  Tourniquet time: None  Drains: None  Closure: Skin sutures   Brief clinical note: The patient has had a long history of right shoulder pain. The patient's symptoms have progressed despite medications, activity modification, etc. The patient's history and examination are consistent with impingement/tendinopathy with a rotator cuff tear. These findings were confirmed by MRI scan. The patient presents at this time for definitive management of these shoulder symptoms.  Procedure: The patient was brought into the operating room and lain in the supine position.  The patient then underwent general endotracheal intubation and anesthesia before being repositioned in the lateral decubitus position. The right shoulder and upper extremity were prepped with ChloraPrep solution before being draped sterilely. Preoperative antibiotics were administered. A timeout was performed . A posterior portal was created and the glenohumeral joint thoroughly inspected with the findings as described above. An anterior portal was created using an outside-in technique. The labrum and rotator cuff were further probed, again confirming the above-noted findings. The ArthroCare wand was inserted and used to obtain hemostasis as well as to "anneal" the labrum superiorly and anteriorly. The frayed biceps was divided. A small cuff tear was visualized.   The scope was  repositioned through the posterior portal into the subacromial space. A separate lateral portal was created using an outside-in technique. The 3.5 full-radius resector was introduced and used to perform a subtotal bursectomy. The ArthroCare wand was then inserted and used to remove the periosteal tissue off the undersurface of the anterior third of the acromion as well as to recess the coracoacromial ligament from its attachment along the anterior and lateral margins of the acromion. The 4.0 mm acromionizing bur was introduced and used to complete the decompression by removing the undersurface of the anterior third of the acromion.  The instruments were then removed from the subacromial space.   The rotator cuff tear was readily identified. The margins were debrided.  The exposed greater tuberosity roughened with a motorized bur. The tear was repaired using a horizontal mattress suture secured to a 5.5 Speed screw.    The portal sites were closed using #4 nylon sutures A sterile bulky dressing was applied to the shoulder before the arm was placed into a shoulder immobilizer. I did apply for TENS pads The patient was then awakened, extubated, and returned to the recovery room in satisfactory condition after tolerating the procedure well.

## 2015-02-09 NOTE — Anesthesia Procedure Notes (Signed)
Procedure Name: Intubation Date/Time: 02/09/2015 7:39 AM Performed by: Silvana Newness Pre-anesthesia Checklist: Patient identified, Emergency Drugs available, Suction available, Patient being monitored and Timeout performed Patient Re-evaluated:Patient Re-evaluated prior to inductionOxygen Delivery Method: Circle system utilized Preoxygenation: Pre-oxygenation with 100% oxygen Intubation Type: IV induction Ventilation: Mask ventilation without difficulty Laryngoscope Size: Mac and 3 Grade View: Grade II Tube type: Oral Tube size: 7.0 mm Number of attempts: 1 Airway Equipment and Method: Stylet Placement Confirmation: ETT inserted through vocal cords under direct vision,  positive ETCO2 and breath sounds checked- equal and bilateral Secured at: 20 cm Tube secured with: Tape Dental Injury: Teeth and Oropharynx as per pre-operative assessment

## 2015-02-09 NOTE — Transfer of Care (Signed)
Immediate Anesthesia Transfer of Care Note  Patient: Tammy Hatfield  Procedure(s) Performed: Procedure(s): SHOULDER ARTHROSCOPY WITH ROTATOR CUFF REPAIR,release long head biceps tendon,subacromial decompression. (Right)  Patient Location: PACU  Anesthesia Type:General  Level of Consciousness: awake, alert , oriented and pateint uncooperative  Airway & Oxygen Therapy: Patient Spontanous Breathing and Patient connected to face mask oxygen  Post-op Assessment: Report given to RN, Post -op Vital signs reviewed and stable and Patient moving all extremities X 4  Post vital signs: Reviewed and stable  Last Vitals:  Filed Vitals:   02/09/15 1015  BP: 172/71  Pulse: 93  Temp: 37.5 C  Resp: 18    Complications: No apparent anesthesia complications

## 2015-02-09 NOTE — Anesthesia Preprocedure Evaluation (Addendum)
Anesthesia Evaluation  Patient identified by MRN, date of birth, ID band Patient awake    Reviewed: Allergy & Precautions, NPO status , Patient's Chart, lab work & pertinent test results  History of Anesthesia Complications Negative for: history of anesthetic complications  Airway Mallampati: II  TM Distance: >3 FB Neck ROM: Full    Dental no notable dental hx.    Pulmonary sleep apnea and Continuous Positive Airway Pressure Ventilation , COPD COPD inhaler, former smoker,  Pt states SOB with minimal exertion. breath sounds clear to auscultation  Pulmonary exam normal       Cardiovascular hypertension, Pt. on medications Normal cardiovascular examRhythm:Regular Rate:Normal     Neuro/Psych negative neurological ROS  negative psych ROS   GI/Hepatic Neg liver ROS, PUD, GERD-  Medicated and Controlled,  Endo/Other  negative endocrine ROSdiabetes, Type 2, Insulin Dependent, Oral Hypoglycemic Agents  Renal/GU negative Renal ROS  negative genitourinary   Musculoskeletal  (+) Arthritis -, Osteoarthritis,    Abdominal   Peds negative pediatric ROS (+)  Hematology negative hematology ROS (+)   Anesthesia Other Findings   Reproductive/Obstetrics negative OB ROS                            Anesthesia Physical Anesthesia Plan  ASA: III  Anesthesia Plan: General   Post-op Pain Management:    Induction: Intravenous  Airway Management Planned: Oral ETT  Additional Equipment:   Intra-op Plan:   Post-operative Plan: Extubation in OR  Informed Consent: I have reviewed the patients History and Physical, chart, labs and discussed the procedure including the risks, benefits and alternatives for the proposed anesthesia with the patient or authorized representative who has indicated his/her understanding and acceptance.   Dental advisory given  Plan Discussed with: CRNA and Surgeon  Anesthesia  Plan Comments: (Discussed )        Anesthesia Quick Evaluation

## 2015-02-09 NOTE — Anesthesia Postprocedure Evaluation (Signed)
  Anesthesia Post-op Note  Patient: Tammy Hatfield  Procedure(s) Performed: Procedure(s): SHOULDER ARTHROSCOPY WITH ROTATOR CUFF REPAIR,release long head biceps tendon,subacromial decompression. (Right)  Anesthesia type:General  Patient location: PACU  Post pain: Pain level controlled  Post assessment: Post-op Vital signs reviewed, Patient's Cardiovascular Status Stable, Respiratory Function Stable, Patent Airway and No signs of Nausea or vomiting  Post vital signs: Reviewed and stable  Last Vitals:  Filed Vitals:   02/09/15 1157  BP: 119/50  Pulse: 89  Temp: 36.5 C  Resp: 16    Level of consciousness: awake, alert  and patient cooperative  Complications: No apparent anesthesia complications

## 2015-02-10 ENCOUNTER — Encounter: Payer: Self-pay | Admitting: Unknown Physician Specialty

## 2015-02-11 ENCOUNTER — Emergency Department: Payer: Medicare Other

## 2015-02-11 ENCOUNTER — Encounter: Payer: Self-pay | Admitting: Emergency Medicine

## 2015-02-11 ENCOUNTER — Inpatient Hospital Stay
Admission: EM | Admit: 2015-02-11 | Discharge: 2015-02-15 | DRG: 291 | Disposition: A | Discharge status: Skilled Nursing Facility | Payer: Medicare Other | Attending: Specialist | Admitting: Specialist

## 2015-02-11 ENCOUNTER — Inpatient Hospital Stay
Admit: 2015-02-11 | Discharge: 2015-02-11 | Disposition: A | Discharge status: Home / Self Care | Payer: Medicare Other | Attending: Internal Medicine | Admitting: Internal Medicine

## 2015-02-11 DIAGNOSIS — I509 Heart failure, unspecified: Secondary | ICD-10-CM

## 2015-02-11 DIAGNOSIS — E875 Hyperkalemia: Secondary | ICD-10-CM | POA: Diagnosis present

## 2015-02-11 DIAGNOSIS — T40601A Poisoning by unspecified narcotics, accidental (unintentional), initial encounter: Secondary | ICD-10-CM

## 2015-02-11 DIAGNOSIS — N289 Disorder of kidney and ureter, unspecified: Secondary | ICD-10-CM

## 2015-02-11 DIAGNOSIS — I5031 Acute diastolic (congestive) heart failure: Secondary | ICD-10-CM | POA: Diagnosis present

## 2015-02-11 DIAGNOSIS — F419 Anxiety disorder, unspecified: Secondary | ICD-10-CM | POA: Diagnosis present

## 2015-02-11 DIAGNOSIS — Z7951 Long term (current) use of inhaled steroids: Secondary | ICD-10-CM | POA: Diagnosis not present

## 2015-02-11 DIAGNOSIS — K279 Peptic ulcer, site unspecified, unspecified as acute or chronic, without hemorrhage or perforation: Secondary | ICD-10-CM

## 2015-02-11 DIAGNOSIS — Z794 Long term (current) use of insulin: Secondary | ICD-10-CM | POA: Diagnosis not present

## 2015-02-11 DIAGNOSIS — R0902 Hypoxemia: Secondary | ICD-10-CM | POA: Diagnosis present

## 2015-02-11 DIAGNOSIS — M1990 Unspecified osteoarthritis, unspecified site: Secondary | ICD-10-CM | POA: Diagnosis present

## 2015-02-11 DIAGNOSIS — J449 Chronic obstructive pulmonary disease, unspecified: Secondary | ICD-10-CM | POA: Diagnosis present

## 2015-02-11 DIAGNOSIS — M5136 Other intervertebral disc degeneration, lumbar region: Secondary | ICD-10-CM | POA: Diagnosis present

## 2015-02-11 DIAGNOSIS — T402X1A Poisoning by other opioids, accidental (unintentional), initial encounter: Secondary | ICD-10-CM | POA: Diagnosis present

## 2015-02-11 DIAGNOSIS — G4733 Obstructive sleep apnea (adult) (pediatric): Secondary | ICD-10-CM

## 2015-02-11 DIAGNOSIS — G9341 Metabolic encephalopathy: Secondary | ICD-10-CM | POA: Diagnosis present

## 2015-02-11 DIAGNOSIS — Z9989 Dependence on other enabling machines and devices: Secondary | ICD-10-CM

## 2015-02-11 DIAGNOSIS — E785 Hyperlipidemia, unspecified: Secondary | ICD-10-CM

## 2015-02-11 DIAGNOSIS — N179 Acute kidney failure, unspecified: Secondary | ICD-10-CM | POA: Diagnosis present

## 2015-02-11 DIAGNOSIS — K219 Gastro-esophageal reflux disease without esophagitis: Secondary | ICD-10-CM | POA: Diagnosis present

## 2015-02-11 DIAGNOSIS — E119 Type 2 diabetes mellitus without complications: Secondary | ICD-10-CM | POA: Diagnosis present

## 2015-02-11 DIAGNOSIS — I071 Rheumatic tricuspid insufficiency: Secondary | ICD-10-CM | POA: Diagnosis present

## 2015-02-11 DIAGNOSIS — Z86010 Personal history of colonic polyps: Secondary | ICD-10-CM | POA: Diagnosis not present

## 2015-02-11 DIAGNOSIS — E860 Dehydration: Secondary | ICD-10-CM | POA: Diagnosis present

## 2015-02-11 DIAGNOSIS — I10 Essential (primary) hypertension: Secondary | ICD-10-CM

## 2015-02-11 DIAGNOSIS — R1310 Dysphagia, unspecified: Secondary | ICD-10-CM

## 2015-02-11 DIAGNOSIS — M75100 Unspecified rotator cuff tear or rupture of unspecified shoulder, not specified as traumatic: Secondary | ICD-10-CM

## 2015-02-11 DIAGNOSIS — R945 Abnormal results of liver function studies: Secondary | ICD-10-CM

## 2015-02-11 DIAGNOSIS — K2270 Barrett's esophagus without dysplasia: Secondary | ICD-10-CM | POA: Diagnosis present

## 2015-02-11 DIAGNOSIS — E782 Mixed hyperlipidemia: Secondary | ICD-10-CM | POA: Diagnosis present

## 2015-02-11 DIAGNOSIS — R7989 Other specified abnormal findings of blood chemistry: Secondary | ICD-10-CM

## 2015-02-11 DIAGNOSIS — M75101 Unspecified rotator cuff tear or rupture of right shoulder, not specified as traumatic: Secondary | ICD-10-CM | POA: Diagnosis present

## 2015-02-11 DIAGNOSIS — E7220 Disorder of urea cycle metabolism, unspecified: Secondary | ICD-10-CM | POA: Diagnosis present

## 2015-02-11 DIAGNOSIS — Z9889 Other specified postprocedural states: Secondary | ICD-10-CM | POA: Diagnosis not present

## 2015-02-11 DIAGNOSIS — E876 Hypokalemia: Secondary | ICD-10-CM | POA: Diagnosis present

## 2015-02-11 DIAGNOSIS — Z853 Personal history of malignant neoplasm of breast: Secondary | ICD-10-CM | POA: Diagnosis not present

## 2015-02-11 DIAGNOSIS — Z800 Family history of malignant neoplasm of digestive organs: Secondary | ICD-10-CM | POA: Diagnosis not present

## 2015-02-11 DIAGNOSIS — Z825 Family history of asthma and other chronic lower respiratory diseases: Secondary | ICD-10-CM | POA: Diagnosis not present

## 2015-02-11 DIAGNOSIS — Z87891 Personal history of nicotine dependence: Secondary | ICD-10-CM

## 2015-02-11 DIAGNOSIS — M7541 Impingement syndrome of right shoulder: Secondary | ICD-10-CM | POA: Diagnosis present

## 2015-02-11 DIAGNOSIS — F329 Major depressive disorder, single episode, unspecified: Secondary | ICD-10-CM | POA: Diagnosis present

## 2015-02-11 DIAGNOSIS — Y92009 Unspecified place in unspecified non-institutional (private) residence as the place of occurrence of the external cause: Secondary | ICD-10-CM | POA: Diagnosis not present

## 2015-02-11 LAB — CBC WITH DIFFERENTIAL/PLATELET
BASOS ABS: 0.1 10*3/uL (ref 0–0.1)
BASOS PCT: 0 %
Eosinophils Absolute: 0 10*3/uL (ref 0–0.7)
Eosinophils Relative: 0 %
HCT: 38.4 % (ref 35.0–47.0)
HEMOGLOBIN: 12.5 g/dL (ref 12.0–16.0)
LYMPHS PCT: 12 %
Lymphs Abs: 1.4 10*3/uL (ref 1.0–3.6)
MCH: 29.7 pg (ref 26.0–34.0)
MCHC: 32.6 g/dL (ref 32.0–36.0)
MCV: 91.1 fL (ref 80.0–100.0)
MONOS PCT: 8 %
Monocytes Absolute: 0.9 10*3/uL (ref 0.2–0.9)
Neutro Abs: 9.5 10*3/uL — ABNORMAL HIGH (ref 1.4–6.5)
Neutrophils Relative %: 80 %
Platelets: 170 10*3/uL (ref 150–440)
RBC: 4.21 MIL/uL (ref 3.80–5.20)
RDW: 14.9 % — AB (ref 11.5–14.5)
WBC: 11.8 10*3/uL — ABNORMAL HIGH (ref 3.6–11.0)

## 2015-02-11 LAB — URINALYSIS COMPLETE WITH MICROSCOPIC (ARMC ONLY)
BACTERIA UA: NONE SEEN
Bilirubin Urine: NEGATIVE
Glucose, UA: 50 mg/dL — AB
Hgb urine dipstick: NEGATIVE
KETONES UR: NEGATIVE mg/dL
Leukocytes, UA: NEGATIVE
Nitrite: NEGATIVE
PROTEIN: NEGATIVE mg/dL
Specific Gravity, Urine: 1.017 (ref 1.005–1.030)
Squamous Epithelial / LPF: NONE SEEN
WBC UA: NONE SEEN WBC/hpf (ref 0–5)
pH: 5 (ref 5.0–8.0)

## 2015-02-11 LAB — CBC
HEMATOCRIT: 34.3 % — AB (ref 35.0–47.0)
Hemoglobin: 11.2 g/dL — ABNORMAL LOW (ref 12.0–16.0)
MCH: 29.7 pg (ref 26.0–34.0)
MCHC: 32.7 g/dL (ref 32.0–36.0)
MCV: 91 fL (ref 80.0–100.0)
Platelets: 148 10*3/uL — ABNORMAL LOW (ref 150–440)
RBC: 3.77 MIL/uL — AB (ref 3.80–5.20)
RDW: 14.9 % — ABNORMAL HIGH (ref 11.5–14.5)
WBC: 6.8 10*3/uL (ref 3.6–11.0)

## 2015-02-11 LAB — CK: Total CK: 169 U/L (ref 38–234)

## 2015-02-11 LAB — BASIC METABOLIC PANEL
Anion gap: 6 (ref 5–15)
BUN: 31 mg/dL — AB (ref 6–20)
CO2: 28 mmol/L (ref 22–32)
Calcium: 7.9 mg/dL — ABNORMAL LOW (ref 8.9–10.3)
Chloride: 103 mmol/L (ref 101–111)
Creatinine, Ser: 1.37 mg/dL — ABNORMAL HIGH (ref 0.44–1.00)
GFR calc Af Amer: 45 mL/min — ABNORMAL LOW (ref >60)
GFR calc non Af Amer: 39 mL/min — ABNORMAL LOW (ref >60)
Glucose, Bld: 224 mg/dL — ABNORMAL HIGH (ref 65–99)
POTASSIUM: 4.3 mmol/L (ref 3.5–5.1)
Sodium: 137 mmol/L (ref 135–145)

## 2015-02-11 LAB — COMPREHENSIVE METABOLIC PANEL
ALT: 841 U/L — ABNORMAL HIGH (ref 14–54)
ALT: 833 U/L — AB (ref 14–54)
AST: 1200 U/L — ABNORMAL HIGH (ref 15–41)
AST: 1229 U/L — ABNORMAL HIGH (ref 15–41)
Albumin: 3.8 g/dL (ref 3.5–5.0)
Albumin: 3.7 g/dL (ref 3.5–5.0)
Alkaline Phosphatase: 113 U/L (ref 38–126)
Alkaline Phosphatase: 107 U/L (ref 38–126)
Anion gap: 11 (ref 5–15)
Anion gap: 10 (ref 5–15)
BILIRUBIN TOTAL: 1.6 mg/dL — AB (ref 0.3–1.2)
BILIRUBIN TOTAL: 0.6 mg/dL (ref 0.3–1.2)
BUN: 50 mg/dL — AB (ref 6–20)
BUN: 48 mg/dL — AB (ref 6–20)
CALCIUM: 8.3 mg/dL — AB (ref 8.9–10.3)
CHLORIDE: 100 mmol/L — AB (ref 101–111)
CO2: 22 mmol/L (ref 22–32)
CO2: 22 mmol/L (ref 22–32)
CREATININE: 2.35 mg/dL — AB (ref 0.44–1.00)
Calcium: 8.1 mg/dL — ABNORMAL LOW (ref 8.9–10.3)
Chloride: 103 mmol/L (ref 101–111)
Creatinine, Ser: 2.29 mg/dL — ABNORMAL HIGH (ref 0.44–1.00)
GFR calc non Af Amer: 21 mL/min — ABNORMAL LOW (ref >60)
GFR, EST AFRICAN AMERICAN: 23 mL/min — AB (ref >60)
GFR, EST AFRICAN AMERICAN: 24 mL/min — AB (ref >60)
GFR, EST NON AFRICAN AMERICAN: 20 mL/min — AB (ref >60)
Glucose, Bld: 306 mg/dL — ABNORMAL HIGH (ref 65–99)
Glucose, Bld: 324 mg/dL — ABNORMAL HIGH (ref 65–99)
POTASSIUM: 6.7 mmol/L — AB (ref 3.5–5.1)
Potassium: 5.4 mmol/L — ABNORMAL HIGH (ref 3.5–5.1)
Sodium: 133 mmol/L — ABNORMAL LOW (ref 135–145)
Sodium: 135 mmol/L (ref 135–145)
TOTAL PROTEIN: 6.8 g/dL (ref 6.5–8.1)
Total Protein: 7.2 g/dL (ref 6.5–8.1)

## 2015-02-11 LAB — GLUCOSE, CAPILLARY
GLUCOSE-CAPILLARY: 248 mg/dL — AB (ref 65–99)
GLUCOSE-CAPILLARY: 254 mg/dL — AB (ref 65–99)
GLUCOSE-CAPILLARY: 223 mg/dL — AB (ref 65–99)
Glucose-Capillary: 218 mg/dL — ABNORMAL HIGH (ref 65–99)

## 2015-02-11 LAB — PROTIME-INR
INR: 1.19
PROTHROMBIN TIME: 15.3 s — AB (ref 11.4–15.0)

## 2015-02-11 LAB — URINE DRUG SCREEN, QUALITATIVE (ARMC ONLY)
AMPHETAMINES, UR SCREEN: NOT DETECTED
BARBITURATES, UR SCREEN: NOT DETECTED
Benzodiazepine, Ur Scrn: POSITIVE — AB
Cannabinoid 50 Ng, Ur ~~LOC~~: NOT DETECTED
Cocaine Metabolite,Ur ~~LOC~~: NOT DETECTED
MDMA (Ecstasy)Ur Screen: NOT DETECTED
Methadone Scn, Ur: NOT DETECTED
Opiate, Ur Screen: POSITIVE — AB
Phencyclidine (PCP) Ur S: NOT DETECTED
Tricyclic, Ur Screen: NOT DETECTED

## 2015-02-11 LAB — CREATININE, SERUM
CREATININE: 1.28 mg/dL — AB (ref 0.44–1.00)
GFR calc Af Amer: 49 mL/min — ABNORMAL LOW (ref >60)
GFR, EST NON AFRICAN AMERICAN: 42 mL/min — AB (ref >60)

## 2015-02-11 LAB — SALICYLATE LEVEL: Salicylate Lvl: <4 mg/dL (ref 2.8–30.0)

## 2015-02-11 LAB — ACETAMINOPHEN LEVEL

## 2015-02-11 LAB — AMMONIA: Ammonia: 45 umol/L — ABNORMAL HIGH (ref 9–35)

## 2015-02-11 MED ORDER — CALCIUM GLUCONATE 10 % IV SOLN: 1.0000 g | Freq: Once | INTRAVENOUS | Status: DC

## 2015-02-11 MED ORDER — NALOXONE HCL 1 MG/ML IJ SOLN
0.4000 mg | Freq: Once | INTRAMUSCULAR | Status: AC
  Administered 2015-02-11: 0.4 mg via INTRAVENOUS

## 2015-02-11 MED ORDER — INSULIN ASPART 100 UNIT/ML ~~LOC~~ SOLN
SUBCUTANEOUS | Status: AC
  Administered 2015-02-11: 10 [IU] via INTRAVENOUS
  Filled 2015-02-11: qty 10

## 2015-02-11 MED ORDER — INSULIN ASPART 100 UNIT/ML ~~LOC~~ SOLN: 10.0000 [IU] | Freq: Once | SUBCUTANEOUS | Status: DC

## 2015-02-11 MED ORDER — ONDANSETRON HCL 4 MG/2ML IJ SOLN
4.0000 mg | Freq: Once | INTRAMUSCULAR | Status: AC
  Administered 2015-02-11: 4 mg via INTRAVENOUS

## 2015-02-11 MED ORDER — IOHEXOL 240 MG/ML SOLN: 50.0000 mL | INTRAMUSCULAR | Status: AC

## 2015-02-11 MED ORDER — SODIUM CHLORIDE 0.9 % IV BOLUS (SEPSIS)
1000.0000 mL | Freq: Once | INTRAVENOUS | Status: AC
  Administered 2015-02-11: 1000 mL via INTRAVENOUS

## 2015-02-11 MED ORDER — NALOXONE HCL 1 MG/ML IJ SOLN
INTRAMUSCULAR | Status: AC
  Administered 2015-02-11: 0.4 mg via INTRAVENOUS
  Filled 2015-02-11: qty 2

## 2015-02-11 MED ORDER — ALPRAZOLAM 0.5 MG PO TABS
0.5000 mg | ORAL_TABLET | Freq: Two times a day (BID) | ORAL | Status: DC | PRN
  Administered 2015-02-12: 0.5 mg via ORAL
  Filled 2015-02-11: qty 1

## 2015-02-11 MED ORDER — SODIUM BICARBONATE 8.4 % IV SOLN
50.0000 meq | Freq: Once | INTRAVENOUS | Status: AC
  Administered 2015-02-11: 50 meq via INTRAVENOUS

## 2015-02-11 MED ORDER — HEPARIN SODIUM (PORCINE) 5000 UNIT/ML IJ SOLN
5000.0000 [IU] | Freq: Three times a day (TID) | INTRAMUSCULAR | Status: DC
  Administered 2015-02-11 – 2015-02-15 (×12): 5000 [IU] via SUBCUTANEOUS
  Filled 2015-02-11 (×12): qty 1

## 2015-02-11 MED ORDER — DEXTROSE 50 % IV SOLN: 25.0000 mL | Freq: Once | INTRAVENOUS | Status: DC

## 2015-02-11 MED ORDER — INSULIN ASPART 100 UNIT/ML ~~LOC~~ SOLN
0.0000 [IU] | Freq: Every day | SUBCUTANEOUS | Status: DC
  Administered 2015-02-11: 2 [IU] via SUBCUTANEOUS
  Administered 2015-02-12 – 2015-02-13 (×2): 3 [IU] via SUBCUTANEOUS
  Administered 2015-02-14: 4 [IU] via SUBCUTANEOUS
  Filled 2015-02-11: qty 3
  Filled 2015-02-11: qty 4
  Filled 2015-02-11: qty 2
  Filled 2015-02-11 (×2): qty 3

## 2015-02-11 MED ORDER — HALOPERIDOL LACTATE 5 MG/ML IJ SOLN: 1.0000 mg | Freq: Four times a day (QID) | INTRAMUSCULAR | Status: DC | PRN

## 2015-02-11 MED ORDER — BUDESONIDE-FORMOTEROL FUMARATE 160-4.5 MCG/ACT IN AERO
2.0000 | INHALATION_SPRAY | Freq: Two times a day (BID) | RESPIRATORY_TRACT | Status: DC | PRN
  Filled 2015-02-11: qty 6

## 2015-02-11 MED ORDER — ACETAMINOPHEN 650 MG RE SUPP: 650.0000 mg | Freq: Four times a day (QID) | RECTAL | Status: DC | PRN

## 2015-02-11 MED ORDER — RIFAXIMIN 550 MG PO TABS
550.0000 mg | ORAL_TABLET | Freq: Two times a day (BID) | ORAL | Status: DC
  Administered 2015-02-11 – 2015-02-14 (×6): 550 mg via ORAL
  Filled 2015-02-11 (×8): qty 1

## 2015-02-11 MED ORDER — ONDANSETRON HCL 4 MG/2ML IJ SOLN
INTRAMUSCULAR | Status: AC
  Administered 2015-02-11: 4 mg via INTRAVENOUS
  Filled 2015-02-11: qty 2

## 2015-02-11 MED ORDER — DOCUSATE SODIUM 100 MG PO CAPS
100.0000 mg | ORAL_CAPSULE | Freq: Two times a day (BID) | ORAL | Status: DC
  Administered 2015-02-11 – 2015-02-14 (×2): 100 mg via ORAL
  Filled 2015-02-11 (×6): qty 1

## 2015-02-11 MED ORDER — PANTOPRAZOLE SODIUM 40 MG PO TBEC
40.0000 mg | DELAYED_RELEASE_TABLET | Freq: Every day | ORAL | Status: DC
  Administered 2015-02-11 – 2015-02-15 (×5): 40 mg via ORAL
  Filled 2015-02-11 (×5): qty 1

## 2015-02-11 MED ORDER — ACETAMINOPHEN 325 MG PO TABS: 650.0000 mg | ORAL_TABLET | Freq: Four times a day (QID) | ORAL | Status: DC | PRN

## 2015-02-11 MED ORDER — TRAMADOL HCL 50 MG PO TABS: 50.0000 mg | ORAL_TABLET | Freq: Four times a day (QID) | ORAL | Status: DC | PRN

## 2015-02-11 MED ORDER — IOHEXOL 240 MG/ML SOLN
50.0000 mL | INTRAMUSCULAR | Status: DC
  Administered 2015-02-11: 50 mL via ORAL

## 2015-02-11 MED ORDER — SODIUM POLYSTYRENE SULFONATE 15 GM/60ML PO SUSP
ORAL | Status: AC
  Administered 2015-02-11: 30 g via ORAL
  Filled 2015-02-11: qty 120

## 2015-02-11 MED ORDER — LORATADINE 10 MG PO TABS
10.0000 mg | ORAL_TABLET | ORAL | Status: DC
  Administered 2015-02-12 – 2015-02-15 (×4): 10 mg via ORAL
  Filled 2015-02-11 (×5): qty 1

## 2015-02-11 MED ORDER — INSULIN ASPART 100 UNIT/ML ~~LOC~~ SOLN
0.0000 [IU] | Freq: Three times a day (TID) | SUBCUTANEOUS | Status: DC
  Administered 2015-02-11 – 2015-02-13 (×5): 5 [IU] via SUBCUTANEOUS
  Administered 2015-02-13 (×2): 3 [IU] via SUBCUTANEOUS
  Administered 2015-02-14 (×2): 5 [IU] via SUBCUTANEOUS
  Administered 2015-02-14 – 2015-02-15 (×2): 8 [IU] via SUBCUTANEOUS
  Filled 2015-02-11 (×2): qty 5
  Filled 2015-02-11 (×2): qty 8
  Filled 2015-02-11 (×5): qty 5
  Filled 2015-02-11: qty 3

## 2015-02-11 MED ORDER — SODIUM CHLORIDE 0.9 % IV SOLN
INTRAVENOUS | Status: DC
  Administered 2015-02-11: 12:00:00 via INTRAVENOUS

## 2015-02-11 MED ORDER — OXYCODONE-ACETAMINOPHEN 5-325 MG PO TABS: 1.0000 | ORAL_TABLET | ORAL | Status: DC | PRN

## 2015-02-11 MED ORDER — INSULIN ASPART 100 UNIT/ML ~~LOC~~ SOLN
10.0000 [IU] | Freq: Once | SUBCUTANEOUS | Status: AC
  Administered 2015-02-11: 10 [IU] via INTRAVENOUS

## 2015-02-11 MED ORDER — INSULIN ASPART 100 UNIT/ML ~~LOC~~ SOLN
3.0000 [IU] | Freq: Three times a day (TID) | SUBCUTANEOUS | Status: DC
  Administered 2015-02-13 – 2015-02-15 (×7): 3 [IU] via SUBCUTANEOUS
  Filled 2015-02-11 (×9): qty 3

## 2015-02-11 MED ORDER — ALBUTEROL SULFATE (2.5 MG/3ML) 0.083% IN NEBU: 2.5000 mg | INHALATION_SOLUTION | RESPIRATORY_TRACT | Status: DC | PRN

## 2015-02-11 MED ORDER — SODIUM BICARBONATE 8.4 % IV SOLN: 50.0000 meq | Freq: Once | INTRAVENOUS | Status: DC

## 2015-02-11 MED ORDER — SODIUM BICARBONATE 8.4 % IV SOLN
INTRAVENOUS | Status: AC
  Administered 2015-02-11: 50 meq via INTRAVENOUS
  Filled 2015-02-11: qty 50

## 2015-02-11 MED ORDER — SODIUM POLYSTYRENE SULFONATE 15 GM/60ML PO SUSP
30.0000 g | Freq: Once | ORAL | Status: AC
  Administered 2015-02-11: 30 g via ORAL

## 2015-02-11 MED ORDER — PAROXETINE HCL 30 MG PO TABS
40.0000 mg | ORAL_TABLET | ORAL | Status: DC
  Administered 2015-02-12 – 2015-02-15 (×4): 40 mg via ORAL
  Filled 2015-02-11 (×5): qty 1

## 2015-02-11 NOTE — ED Provider Notes (Signed)
[facility] Emergency Department Provider Note  ____________________________________________  Time seen: 3:30 AM  I have reviewed the triage vital signs and the nursing notes.   HISTORY  Chief Complaint Post-op Problem      HPI Tammy Hatfield is a 69 y.o. female presents with vomiting and altered mental status per EMS.Of note patient recently underwent right shoulder surgery performed by Dr. Jefm Bryant on 02/09/2015. MS states that patient is very somnolent noted to be hypoxic on arrival to the emergency department with O2 sat in the 50s. Patient does not know what she was prescribed for pain at home and does not recall when she took her last dose.   Past Medical History  Diagnosis Date  . Ulcer, stomach peptic     history of  . Diabetes mellitus without complication   . Hypercholesteremia   . Esophageal reflux   . Hyperlipidemia   . Hypertension   . Arthritis     knees  . Degenerative lumbar disc   . Barrett's esophagus   . History of Helicobacter pylori infection   . Dysphagia   . COPD (chronic obstructive pulmonary disease) 2012  . Cancer 2014    right breast     There are no active problems to display for this patient.   Past Surgical History  Procedure Laterality Date  . Uvulopalatopharyngoplasty N/A 2001  . Abdominal hysterectomy    . Laparoscopic tubal ligation Bilateral 1967  . Umbilical hernia repair N/A   . Bunionectomy Bilateral 1980  . Breast lumpectomy Right 2013  . Shoulder arthroscopy with rotator cuff repair Right 02/09/2015    Procedure: SHOULDER ARTHROSCOPY WITH ROTATOR CUFF REPAIR,release long head biceps tendon,subacromial decompression.;  Surgeon: Leanor Kail, MD;  Location: [facility] ORS;  Service: Orthopedics;  Laterality: Right;    Current Outpatient Rx  Name  Route  Sig  Dispense  Refill  . albuterol (PROVENTIL HFA;VENTOLIN HFA) 108 (90 BASE) MCG/ACT inhaler   Inhalation   Inhale 2 puffs into the lungs every 4  (four) hours as needed for wheezing or shortness of breath.         . ALPRAZolam (XANAX) 0.5 MG tablet   Oral   Take 0.5 mg by mouth 2 (two) times daily as needed for anxiety.         . budesonide-formoterol (SYMBICORT) 160-4.5 MCG/ACT inhaler   Inhalation   Inhale 2 puffs into the lungs 2 (two) times daily. Prn         . glipiZIDE (GLUCOTROL) 10 MG tablet   Oral   Take 10 mg by mouth daily before breakfast.         . insulin aspart protamine- aspart (NOVOLOG MIX 70/30) (70-30) 100 UNIT/ML injection   Subcutaneous   Inject 60 Units into the skin daily with breakfast.         . insulin aspart protamine- aspart (NOVOLOG MIX 70/30) (70-30) 100 UNIT/ML injection   Subcutaneous   Inject 40 Units into the skin daily with supper.         . loratadine (CLARITIN) 10 MG tablet   Oral   Take 10 mg by mouth daily. Am         . losartan (COZAAR) 50 MG tablet   Oral   Take 50 mg by mouth daily. Am         . omeprazole (PRILOSEC) 20 MG capsule   Oral   Take 40 mg by mouth daily. Am         .  PARoxetine (PAXIL) 40 MG tablet   Oral   Take 40 mg by mouth every morning.         Marland Kitchen ROXICET 5-325 MG per tablet   Oral   Take 1-2 tablets by mouth every 4 (four) hours as needed for severe pain.   30 tablet   0     Dispense as written.   . traMADol (ULTRAM) 50 MG tablet   Oral   Take by mouth every 6 (six) hours as needed.           Allergies Metformin and related; Amoxicillin; Avelox; Benazepril; Byetta 10 mcg pen; Neurontin; Statins; Sulfa antibiotics; and Ceclor  No family history on file.  Social History History  Substance Use Topics  . Smoking status: Former Smoker    Quit date: 02/01/2000  . Smokeless tobacco: Not on file  . Alcohol Use: No    Review of Systems  Constitutional: Negative for fever. Eyes: Negative for visual changes. ENT: Negative for sore throat. Cardiovascular: Negative for chest pain. Respiratory: Negative for shortness of  breath. Gastrointestinal: Negative for abdominal pain. Positive vomiting. Genitourinary: Negative for dysuria. Musculoskeletal: Negative for back pain. Skin: Negative for rash. Neurological: Negative for headaches, focal weakness or numbness.   10-point ROS otherwise negative.  ____________________________________________   PHYSICAL EXAM:  VITAL SIGNS: ED Triage Vitals  Enc Vitals Group     BP 02/11/15 0336 131/102 mmHg     Pulse Rate 02/11/15 0336 82     Resp 02/11/15 0400 27     Temp 02/11/15 0322 98.9 F (37.2 C)     Temp src --      SpO2 02/11/15 0336 95 %     Weight --      Height --      Head Cir --      Peak Flow --      Pain Score --      Pain Loc --      Pain Edu? --      Excl. in [place]? --     Constitutional: Somnolent but arousable. Well appearing and in no distress. Eyes: Conjunctivae are normal. PERRL. Normal extraocular movements. ENT   Head: Normocephalic and atraumatic.   Nose: No congestion/rhinnorhea.   Mouth/Throat: Mucous membranes are moist.   Neck: No stridor.. Cardiovascular: Normal rate, regular rhythm. Normal and symmetric distal pulses are present in all extremities. No murmurs, rubs, or gallops. Respiratory: Normal respiratory effort without tachypnea nor retractions. Breath sounds are clear and equal bilaterally. No wheezes/rales/rhonchi. Gastrointestinal: Soft and nontender. No distention. There is no CVA tenderness. Genitourinary: deferred Musculoskeletal: Nontender with normal range of motion in all extremities. No joint effusions.  No lower extremity tenderness nor edema. Neurologic:  Normal speech and language. No gross focal neurologic deficits are appreciated. Speech is normal.  Skin:  Skin is warm, dry and intact. No rash noted. Psychiatric: Mood and affect are normal. Speech and behavior are normal. Patient exhibits appropriate insight and judgment.  ____________________________________________    LABS (pertinent  positives/negatives) Labs Reviewed  CBC WITH DIFFERENTIAL/PLATELET - Abnormal; Notable for the following:    WBC 11.8 (*)    RDW 14.9 (*)    Neutro Abs 9.5 (*)    All other components within normal limits  COMPREHENSIVE METABOLIC PANEL - Abnormal; Notable for the following:    Sodium 133 (*)    Potassium 6.7 (*)    Chloride 100 (*)    Glucose, Bld 306 (*)    BUN 50 (*)  Creatinine, Ser 2.35 (*)    Calcium 8.3 (*)    AST 1200 (*)    ALT 841 (*)    Total Bilirubin 1.6 (*)    GFR calc non Af Amer 20 (*)    GFR calc Af Amer 23 (*)    All other components within normal limits  URINALYSIS COMPLETEWITH MICROSCOPIC ([facility] ONLY) - Abnormal; Notable for the following:    Color, Urine YELLOW (*)    APPearance CLEAR (*)    Glucose, UA 50 (*)    All other components within normal limits  URINE DRUG SCREEN, QUALITATIVE ([facility] ONLY) - Abnormal; Notable for the following:    Opiate, Ur Screen POSITIVE (*)    Benzodiazepine, Ur Scrn POSITIVE (*)    All other components within normal limits  COMPREHENSIVE METABOLIC PANEL - Abnormal; Notable for the following:    Potassium 5.4 (*)    Glucose, Bld 324 (*)    BUN 48 (*)    Creatinine, Ser 2.29 (*)    Calcium 8.1 (*)    AST 1229 (*)    ALT 833 (*)    GFR calc non Af Amer 21 (*)    GFR calc Af Amer 24 (*)    All other components within normal limits     Labs Reviewed  CBC WITH DIFFERENTIAL/PLATELET - Abnormal; Notable for the following:    WBC 11.8 (*)    RDW 14.9 (*)    Neutro Abs 9.5 (*)    All other components within normal limits  COMPREHENSIVE METABOLIC PANEL - Abnormal; Notable for the following:    Sodium 133 (*)    Potassium 6.7 (*)    Chloride 100 (*)    Glucose, Bld 306 (*)    BUN 50 (*)    Creatinine, Ser 2.35 (*)    Calcium 8.3 (*)    AST 1200 (*)    ALT 841 (*)    Total Bilirubin 1.6 (*)    GFR calc non Af Amer 20 (*)    GFR calc Af Amer 23 (*)    All other components within normal limits  URINALYSIS  COMPLETEWITH MICROSCOPIC ([facility] ONLY) - Abnormal; Notable for the following:    Color, Urine YELLOW (*)    APPearance CLEAR (*)    Glucose, UA 50 (*)    All other components within normal limits  URINE DRUG SCREEN, QUALITATIVE ([facility] ONLY) - Abnormal; Notable for the following:    Opiate, Ur Screen POSITIVE (*)    Benzodiazepine, Ur Scrn POSITIVE (*)    All other components within normal limits  COMPREHENSIVE METABOLIC PANEL     ____________________________________________   EKG   Date: 02/11/2015  Rate: 89  Rhythm: normal sinus rhythm  QRS Axis: normal  Intervals: normal  ST/T Wave abnormalities: normal  Conduction Disutrbances: none  Narrative Interpretation: unremarkable      ____________________________________________    RADIOLOGY  CT head negative  ____________________________________________      INITIAL IMPRESSION / ASSESSMENT AND PLAN / ED COURSE  Pertinent labs & imaging results that were available during my care of the patient were reviewed by me and considered in my medical decision making (see chart for details).  Patient received Narcan 0.4 mg on presentation to the ED with improvement in oxygen saturation and alertness. Sats 94% status post Narcan. Patient's daughter presents to the emergency department and stated that she had not received anything for pain since 3:00 PM yesterday at which time she received Ultram. Patient's unsure when  she took anything last for pain. Regarding laboratory data potassium elevated at 6.7 however lab stated some level of homolysis there such a repeat sample was sent to the lab. In addition patient noted to have renal insufficiency with a creatinine of 2.35 IV normal saline 2 L given. Also on the lab data liver enzymes were elevated including bilirubin.  ____________________________________________   FINAL CLINICAL IMPRESSION(S) / ED DIAGNOSES  Final diagnoses:  Renal insufficiency      Gregor Hams,  MD 02/11/15 [phone]

## 2015-02-11 NOTE — ED Notes (Addendum)
Returned from radiology. 

## 2015-02-11 NOTE — ED Provider Notes (Addendum)
[facility]  I accepted care from Dr. Owens Shark ____________________________________________    LABS (pertinent positives/negatives)  Repeat metabolic panel shows potassium 5.4 Abnormal LFTs similar to earlier, with bilirubin in the normal range on repeat   ____________________________________________    RADIOLOGY  CT abdomen and pelvis: No acute findings in the abdomen or pelvis  ____________________________________________   PROCEDURES  Procedure(s) performed: None  Critical Care performed: None  ____________________________________________   INITIAL IMPRESSION / ASSESSMENT AND PLAN / ED COURSE  Pertinent labs & imaging results that were available during my care of the patient were reviewed by me and considered in my medical decision making (see chart for details).  Dr. Saul Fordyce plan was to admit the patient to the hospitalist team due to hypoxia and altered mental status which did resolve after administration of Narcan consistent with opioid excess after recent right shoulder rotator cuff surgery. However she is also found to have acute renal failure and abnormal liver function tests. The repeat metabolic panel showed the potassium minimally elevated at 5.4, Kayexalate was given. The CT scan was pending upon Dr. Saul Fordyce transfer of care, and this was reviewed by me and shows no acute findings in the abdomen and pelvis. I did rediscuss the case with the hospitalist team for admission.  I discussed it with Dr. Timoteo Gaul, who recommended adding on a CK for evaluation of likely rhabdomyolysis. ____________________________________________   FINAL CLINICAL IMPRESSION(S) / ED DIAGNOSES  Hypoxia and altered mental status due to narcotic overdose, unintentional Acute renal failure, unspecified  Hepatitis, unspecified    Lisa Roca, MD 02/11/15 0940  Lisa Roca, MD 02/11/15 [phone]

## 2015-02-11 NOTE — Consult Note (Signed)
[facility] Consultation Note  Patient ID: Tammy Hatfield, MRN: [id number], DOB/AGE: 69/01/1946 69 y.o. Admit date: 02/11/2015   Date of Consult: 02/11/2015 Primary Physician: Adrian Prows, MD Primary Cardiologist: Nehemiah Massed  Chief Complaint:  Chief Complaint  Patient presents with  . Post-op Problem   Reason for Consult: acute systolic dysfunction congestive heart failure with sleep apnea diabetes and chronic obstructive pulmonary disease  HPI: 69 y.o. female with known sleep apnea on CPAP machine for which the patient has not been diligent in the use. The patient does have diabetes well controlled on medication management and chronic obstructive pulmonary disease for which appears to be stable in the last many months. There is moderate mixed hyperlipidemia and essential hypertension on appropriate medication management. The patient recently has had a surgical intervention of right shoulder for which the patient faired well but since then was having worsening shortness of breath expiratory wheeze and hypoxia possibly consistent with the acute systolic dysfunction congestive heart failure. The patient did have a chest x-ray most consistent with pulmonary edema and congestive heart failure. The patient now has given intravenous Lasix and has had some improvements of symptoms as well as oxygen levels are improved with oxygen by nasal cannula. There is no current evidence of myocardial infarction or chest pain consistent with true anginal  Past Medical History  Diagnosis Date  . Ulcer, stomach peptic     history of  . Diabetes mellitus without complication   . Hypercholesteremia   . Esophageal reflux   . Hyperlipidemia   . Hypertension   . Arthritis     knees  . Degenerative lumbar disc   . Barrett's esophagus   . History of Helicobacter pylori infection   . Dysphagia   . COPD (chronic obstructive pulmonary disease) 2012  . Cancer 2014    right breast        Surgical History:  Past Surgical History  Procedure Laterality Date  . Uvulopalatopharyngoplasty N/A 2001  . Abdominal hysterectomy    . Laparoscopic tubal ligation Bilateral 1967  . Umbilical hernia repair N/A   . Bunionectomy Bilateral 1980  . Breast lumpectomy Right 2013  . Shoulder arthroscopy with rotator cuff repair Right 02/09/2015    Procedure: SHOULDER ARTHROSCOPY WITH ROTATOR CUFF REPAIR,release long head biceps tendon,subacromial decompression.;  Surgeon: Leanor Kail, MD;  Location: [facility] ORS;  Service: Orthopedics;  Laterality: Right;     Home Meds: Prior to Admission medications   Medication Sig Start Date End Date Taking? Authorizing Provider  albuterol (PROVENTIL HFA;VENTOLIN HFA) 108 (90 BASE) MCG/ACT inhaler Inhale 2 puffs into the lungs every 4 (four) hours as needed for wheezing or shortness of breath.   Yes Historical Provider, MD  ALPRAZolam Duanne Moron) 0.5 MG tablet Take 0.5 mg by mouth 2 (two) times daily as needed for anxiety.   Yes Historical Provider, MD  budesonide-formoterol (SYMBICORT) 160-4.5 MCG/ACT inhaler Inhale 2 puffs into the lungs 2 (two) times daily as needed (for shortness of breath).    Yes Historical Provider, MD  glipiZIDE (GLUCOTROL) 10 MG tablet Take 10 mg by mouth daily before breakfast.   Yes Historical Provider, MD  insulin aspart protamine- aspart (NOVOLOG MIX 70/30) (70-30) 100 UNIT/ML injection Inject 40-60 Units into the skin 2 (two) times daily. Inject 60 units into the skin daily with breakfast. Inject 40 units into the skin daily with supper.   Yes Historical Provider, MD  loratadine (CLARITIN) 10 MG tablet Take 10 mg by mouth every morning.  Yes Historical Provider, MD  losartan (COZAAR) 50 MG tablet Take 50 mg by mouth every morning.    Yes Historical Provider, MD  omeprazole (PRILOSEC) 20 MG capsule Take 40 mg by mouth every morning.    Yes Historical Provider, MD  PARoxetine (PAXIL) 40 MG tablet Take 40 mg by mouth every morning.    Yes Historical Provider, MD  ROXICET 5-325 MG per tablet Take 1-2 tablets by mouth every 4 (four) hours as needed for severe pain. 02/09/15  Yes Leanor Kail, MD  traMADol (ULTRAM) 50 MG tablet Take 50 mg by mouth every 6 (six) hours as needed for moderate pain.    Yes Historical Provider, MD    Inpatient Medications:  . docusate sodium  100 mg Oral BID  . heparin  5,000 Units Subcutaneous 3 times per day  . insulin aspart  0-15 Units Subcutaneous TID WC  . insulin aspart  0-5 Units Subcutaneous QHS  . insulin aspart  3 Units Subcutaneous TID WC  . [START ON 02/12/2015] loratadine  10 mg Oral BH-q7a  . pantoprazole  40 mg Oral Daily  . [START ON 02/12/2015] PARoxetine  40 mg Oral BH-q7a   . sodium chloride 40 mL/hr at 02/11/15 1151    Allergies:  Allergies  Allergen Reactions  . Metformin And Related Diarrhea  . Amoxicillin Other (See Comments)    Reaction: Yeast infection  . Avelox [Moxifloxacin] Other (See Comments)    Reaction: Muscle pain  . Benazepril Cough  . Byetta 10 Mcg Pen [Exenatide] Diarrhea  . Neurontin [Gabapentin] Other (See Comments)    Reaction: Mouth blister, joint pain and depression  . Statins Other (See Comments)    Reaction: Mouth blisters and joint pain  . Sulfa Antibiotics Other (See Comments)    Reaction: Headache  . Ceclor [Cefaclor] Rash    History   Social History  . Marital Status: Married    Spouse Name: N/A  . Number of Children: N/A  . Years of Education: N/A   Occupational History  . Not on file.   Social History Main Topics  . Smoking status: Former Smoker    Quit date: 02/01/2000  . Smokeless tobacco: Not on file  . Alcohol Use: No  . Drug Use: No  . Sexual Activity: Yes    Birth Control/ Protection: Post-menopausal   Other Topics Concern  . Not on file   Social History Narrative     History reviewed. No pertinent family history.   Review of Systems Positive for shortness of breath Negative for: General:  chills,  fever, night sweats or weight changes.  Cardiovascular: PND orthopnea syncope dizziness  Dermatological skin lesions rashes Respiratory: Cough congestion Urologic: Frequent urination urination at night and hematuria Abdominal: negative for nausea, vomiting, diarrhea, bright red blood per rectum, melena, or hematemesis Neurologic: negative for visual changes, and/or hearing changes  All other systems reviewed and are otherwise negative except as noted above.  Labs:  Recent Labs  02/11/15 0430  CKTOTAL 169   Lab Results  Component Value Date   WBC 6.8 02/11/2015   HGB 11.2* 02/11/2015   HCT 34.3* 02/11/2015   MCV 91.0 02/11/2015   PLT 148* 02/11/2015    Recent Labs Lab 02/11/15 0430 02/11/15 1443  NA 135 137  K 5.4* 4.3  CL 103 103  CO2 22 28  BUN 48* 31*  CREATININE 2.29* 1.28*  1.37*  CALCIUM 8.1* 7.9*  PROT 6.8  --   BILITOT 0.6  --  ALKPHOS 107  --   ALT 833*  --   AST 1229*  --   GLUCOSE 324* 224*   No results found for: CHOL, HDL, LDLCALC, TRIG No results found for: DDIMER  Radiology/Studies:  Ct Abdomen Pelvis Wo Contrast  02/11/2015   CLINICAL DATA:  Initial encounter for nausea vomiting that started last night. History of right-sided breast cancer.  EXAM: CT ABDOMEN AND PELVIS WITHOUT CONTRAST  TECHNIQUE: Multidetector CT imaging of the abdomen and pelvis was performed following the standard protocol without IV contrast.  COMPARISON:  05/18/2009.  FINDINGS: Lower chest: Compressive atelectasis noted in both lower lobes with tiny bilateral pleural effusions.  Hepatobiliary: The liver shows diffusely decreased attenuation suggesting steatosis. Patient is status post cholecystectomy. No intrahepatic or extrahepatic biliary dilation.  Pancreas: No focal mass lesion. No dilatation of the main duct. No intraparenchymal cyst. No peripancreatic edema.  Spleen: No splenomegaly. No focal mass lesion.  Adrenals/Urinary Tract: No adrenal nodule or mass. No evidence for  urinary stones. No hydroureteronephrosis. No gross renal mass lesion on this study without intravenous contrast material. Foley catheter decompresses the urinary bladder and gas in the bladder lumen is compatible with the instrumentation.  Stomach/Bowel: Stomach is nondistended. No gastric wall thickening. No evidence of outlet obstruction. Duodenum is normally positioned as is the ligament of Treitz. No small bowel wall thickening. No small bowel dilatation. Terminal ileum normal. The appendix is not visualized, but there is no edema or inflammation in the region of the cecum. Diverticular changes are noted in the left colon without evidence of diverticulitis.  Vascular/Lymphatic: There is abdominal aortic atherosclerosis without aneurysm. Borderline gastrohepatic ligament lymphadenopathy is stable but decreased in the interval since the prior study, consistent with benign process. There is no retroperitoneal lymphadenopathy.  Reproductive: Uterus is surgically absent.  No adnexal mass.  Other: No intraperitoneal free fluid.  Musculoskeletal: Bone windows reveal no worrisome lytic or sclerotic osseous lesions.  IMPRESSION: No acute findings in the abdomen or pelvis. Specifically, no findings to explain the patient's history of nausea and vomiting.  Colonic diverticulosis without diverticulitis.  Steatosis.   Electronically Signed   By: Misty Stanley M.D.   On: 02/11/2015 08:57   Ct Head Wo Contrast  02/11/2015   CLINICAL DATA:  Altered mental status, nausea and vomiting. Shoulder surgery two days prior.  EXAM: CT HEAD WITHOUT CONTRAST  TECHNIQUE: Contiguous axial images were obtained from the base of the skull through the vertex without intravenous contrast.  COMPARISON:  None.  FINDINGS: No intracranial hemorrhage, mass effect, or midline shift. No hydrocephalus. The basilar cisterns are patent. No evidence of territorial infarct. No intracranial fluid collection. Calvarium is intact. Included paranasal sinuses  and mastoid air cells are well aerated.  IMPRESSION: No acute intracranial abnormality.   Electronically Signed   By: Jeb Levering M.D.   On: 02/11/2015 05:01   Dg Chest Portable 1 View  02/11/2015   CLINICAL DATA:  Hypoxia, dyspnea. Nausea and vomiting. Recent shoulder surgery.  EXAM: PORTABLE CHEST - 1 VIEW  COMPARISON:  None.  FINDINGS: The heart is normal in size, there is atherosclerosis of the thoracic aorta. Bibasilar atelectasis, right greater than left. Mild vascular congestion without edema. No confluent airspace disease, pleural effusion, or pneumothorax. No acute osseous abnormalities are seen.  IMPRESSION: Mild vascular congestion with bibasilar atelectasis.   Electronically Signed   By: Jeb Levering M.D.   On: 02/11/2015 04:45    EKG: Normal sinus rhythm with nonspecific T-wave changes  Weights: Filed Weights   02/11/15 1458 02/11/15 1502  Weight: 230 lb 12.8 oz (104.69 kg) 230 lb 12.8 oz (104.69 kg)     Physical Exam: Blood pressure 156/64, pulse 79, temperature 98.7 F (37.1 C), temperature source Oral, resp. rate 18, height '5\' 4"'$  (1.626 m), weight 230 lb 12.8 oz (104.69 kg), SpO2 92 %. Body mass index is 39.6 kg/(m^2). General: Well developed, well nourished, in no acute distress. Head eyes ears nose throat: Normocephalic, atraumatic, sclera non-icteric, no xanthomas, nares are without discharge. No apparent thyromegaly and/or mass  Lungs: Normal respiratory effort.  Diffuse expiratory wheezes, no rales, few rhonchi.  Heart: RRR with normal S1 S2. no murmur gallop, no rub, PMI is normal size and placement, carotid upstroke normal without bruit, jugular venous pressure is normal Abdomen: Soft, non-tender, non-distended with normoactive bowel sounds. No hepatomegaly. No rebound/guarding. No obvious abdominal masses. Abdominal aorta is normal size without bruit Extremities: Trace edema. no cyanosis, no clubbing, no ulcers  Peripheral : 2+ bilateral upper extremity pulses,  2+ bilateral femoral pulses, 2+ bilateral dorsal pedal pulse Neuro: Alert and oriented. No facial asymmetry. No focal deficit. Moves all extremities spontaneously. Musculoskeletal: Normal muscle tone without kyphosis Psych:  Responds to questions appropriately with a normal affect.    Assessment: 69 year old female with known obstructive sleep apnea not appropriately using CPAP machine diabetes with complication chronic obstructive pulmonary disease essential hypertension mixed hyperlipidemia with acute heart failure with pulmonary edema and hypoxia  Plan: 1. Intravenous Lasix for pulmonary edema and hypoxia 2. Echocardiogram for LV systolic dysfunction valvular heart disease contributing to above 3. Oxygen supplementation and CPAP machine for sleep apnea and concerns for hypoxia 4. Essential hypertension treatment with the metoprolol if able 5. Further diagnostic testing and treatment options after above  Signed, Corey Skains M.D. [facility] 02/11/2015, 4:38 PM

## 2015-02-11 NOTE — ED Notes (Signed)
Pt became somnolent, Sats dropped to 44 %. Pt place on NRB and given 0.4 mg narcan. Pt sats now 99 % and pt is alert and oriented.

## 2015-02-11 NOTE — Consult Note (Signed)
Brief  Gastroenterology consult note  Admitting diagnosis: Acute congestive heart failure with pulmonary edema in the setting of renal failure. Consultation from GI requested for abnormal liver testing  Patient seen by consultant  Consult note dictated: Full consult to follow  Recommend to proceed with surgery or procedure:  Recommend further assessment or treatment: Serum acetaminophen, acetylsalicylic acid and pro time obtained.  Orders entered: As above       Comments: Patient has multiple risk factors for liver disease. 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 However they may be chronic issues as in the past was a health care worker, her husband has a history of hepatitis, he does have a remote history of tattoo placement, is also a remote history of alcohol use is of 2 drinks per day. Would continue current treatment. Further evaluation and recommendations to follow.   Lollie Sails, MD  9:43 PM 02/11/2015

## 2015-02-11 NOTE — Progress Notes (Signed)
*  PRELIMINARY RESULTS* Echocardiogram 2D Echocardiogram has been performed.  Tammy Hatfield 02/11/2015, 5:09 PM

## 2015-02-11 NOTE — ED Notes (Signed)
Pt BIB EMS. Pt was disoriented and had slurred speech. MD at bedside.

## 2015-02-11 NOTE — ED Notes (Signed)
Patient requesting blood sugar to be checked,CBG results=218.

## 2015-02-11 NOTE — H&P (Signed)
[facility] NAME: Tammy Hatfield    MR#:  [id number]  DATE OF BIRTH:  25-Jul-1946  DATE OF ADMISSION:  02/11/2015  PRIMARY CARE PHYSICIAN: Adrian Prows, MD   REQUESTING/REFERRING PHYSICIAN: Dr. Reita Cliche  CHIEF COMPLAINT:   Chief Complaint  Patient presents with  . Post-op Problem    HISTORY OF PRESENT ILLNESS: Tammy Hatfield  is a 69 y.o. female with a known history of notable medical problems including obstructive sleep apnea, COPD, hypertension, diabetes who recently underwent right shoulder surgery for right shoulder impingement syndrome and rotator cuff tear on 02/09/2015 by Dr. Jefm Bryant presents to the hospital with confusion, dehydration, kidney problems. Apparently patient was noted to be disoriented, confused, and was having problems with urination,  was not able to void.  Since surgery she was able to void only twice, according to patient's family. Her blood glucose levels have been running high to 200s to 300s. Patient has not eating well over the past 2 days she started having nausea and vomiting at around 2 AM today and she vomited vigorously for 4 hours. EMS was called and patient was brought to the hospital. In emergency room, she was noted to be somnolent and hypoxic with O2 sats in the 50s. Patient was given Narcan after which her mental status improved , now she is arousable, however, not able to review systems , patient remains confused . Family requests all questions to be addressed with them. They tell me that patient was complaining of abdominal pain mostly over the past few hours . She denies any abdominal pain at present . She admits of some nausea . Chest x-ray revealed congestive heart failure. Patient was also noted to be fluid overloaded with lower extremity swelling. However, her creatinine was also noted to be elevated. Hospitalist services were contacted for admission.   PAST MEDICAL HISTORY:   Past  Medical History  Diagnosis Date  . Ulcer, stomach peptic     history of  . Diabetes mellitus without complication   . Hypercholesteremia   . Esophageal reflux   . Hyperlipidemia   . Hypertension   . Arthritis     knees  . Degenerative lumbar disc   . Barrett's esophagus   . History of Helicobacter pylori infection   . Dysphagia   . COPD (chronic obstructive pulmonary disease) 2012  . Cancer 2014    right breast     PAST SURGICAL HISTORY:  Past Surgical History  Procedure Laterality Date  . Uvulopalatopharyngoplasty N/A 2001  . Abdominal hysterectomy    . Laparoscopic tubal ligation Bilateral 1967  . Umbilical hernia repair N/A   . Bunionectomy Bilateral 1980  . Breast lumpectomy Right 2013  . Shoulder arthroscopy with rotator cuff repair Right 02/09/2015    Procedure: SHOULDER ARTHROSCOPY WITH ROTATOR CUFF REPAIR,release long head biceps tendon,subacromial decompression.;  Surgeon: Leanor Kail, MD;  Location: [facility] ORS;  Service: Orthopedics;  Laterality: Right;    SOCIAL HISTORY:  History  Substance Use Topics  . Smoking status: Former Smoker    Quit date: 02/01/2000  . Smokeless tobacco: Not on file  . Alcohol Use: No    FAMILY HISTORY: Emphysema and patient's mother, colon cancer in patient's mother at age of 6: Polyps and sister, colon cancer in maternal aunt.   DRUG ALLERGIES:  Allergies  Allergen Reactions  . Metformin And Related Diarrhea  . Amoxicillin Other (See Comments)    Reaction: Yeast infection  . Avelox [Moxifloxacin]  Other (See Comments)    Reaction: Muscle pain  . Benazepril Cough  . Byetta 10 Mcg Pen [Exenatide] Diarrhea  . Neurontin [Gabapentin] Other (See Comments)    Reaction: Mouth blister, joint pain and depression  . Statins Other (See Comments)    Reaction: Mouth blisters and joint pain  . Sulfa Antibiotics Other (See Comments)    Reaction: Headache  . Ceclor [Cefaclor] Rash    Review of Systems  Unable to perform ROS:  medical condition  Gastrointestinal: Positive for nausea.    MEDICATIONS AT HOME:  Prior to Admission medications   Medication Sig Start Date End Date Taking? Authorizing Provider  albuterol (PROVENTIL HFA;VENTOLIN HFA) 108 (90 BASE) MCG/ACT inhaler Inhale 2 puffs into the lungs every 4 (four) hours as needed for wheezing or shortness of breath.   Yes Historical Provider, MD  ALPRAZolam Duanne Moron) 0.5 MG tablet Take 0.5 mg by mouth 2 (two) times daily as needed for anxiety.   Yes Historical Provider, MD  budesonide-formoterol (SYMBICORT) 160-4.5 MCG/ACT inhaler Inhale 2 puffs into the lungs 2 (two) times daily as needed (for shortness of breath).    Yes Historical Provider, MD  glipiZIDE (GLUCOTROL) 10 MG tablet Take 10 mg by mouth daily before breakfast.   Yes Historical Provider, MD  insulin aspart protamine- aspart (NOVOLOG MIX 70/30) (70-30) 100 UNIT/ML injection Inject 40-60 Units into the skin 2 (two) times daily. Inject 60 units into the skin daily with breakfast. Inject 40 units into the skin daily with supper.   Yes Historical Provider, MD  loratadine (CLARITIN) 10 MG tablet Take 10 mg by mouth every morning.    Yes Historical Provider, MD  losartan (COZAAR) 50 MG tablet Take 50 mg by mouth every morning.    Yes Historical Provider, MD  omeprazole (PRILOSEC) 20 MG capsule Take 40 mg by mouth every morning.    Yes Historical Provider, MD  PARoxetine (PAXIL) 40 MG tablet Take 40 mg by mouth every morning.   Yes Historical Provider, MD  ROXICET 5-325 MG per tablet Take 1-2 tablets by mouth every 4 (four) hours as needed for severe pain. 02/09/15  Yes Leanor Kail, MD  traMADol (ULTRAM) 50 MG tablet Take 50 mg by mouth every 6 (six) hours as needed for moderate pain.    Yes Historical Provider, MD      PHYSICAL EXAMINATION:   VITAL SIGNS: Blood pressure 141/58, pulse 72, temperature 99 F (37.2 C), resp. rate 18, SpO2 95 %.  GENERAL:  68 y.o.-year-old patient lying in the bed with no  acute distress. Somnolent and confused and babbling by herself. Right shoulder is in sling, in dressing. Some soiling on the dressing but no swelling, or bleeding noted EYES: Pupils equal, round, reactive to light and accommodation. No scleral icterus. Extraocular muscles intact.  HEENT: Head atraumatic, normocephalic. Oropharynx and nasopharynx clear.  NECK:  Supple, no jugular venous distention. No thyroid enlargement, no tenderness.  LUNGS: Diminished breath sounds bilaterally, no wheezing, bilateral anterior aspect rales, and crepitations. No use of accessory muscles of respiration. Tachypneic and mildly uncomfortable, though somnolent CARDIOVASCULAR: S1, S2 normal. No murmurs, rubs, or gallops.  ABDOMEN: Soft, nontender, nondistended. Bowel sounds present. No organomegaly or mass. No significant areas of discomfort when noted on palpation, although patient burps intermittently and feels nauseated EXTREMITIES: No pedal edema, cyanosis, or clubbing.  NEUROLOGIC: Cranial nerves II through XII are intact. Muscle strength 5/5 in all extremities. Sensation intact. Gait not checked.  PSYCHIATRIC: The patient is alert and oriented  x 3.  SKIN: No obvious rash, lesion, or ulcer. 2+ lower extremity edema was noted, no calf tenderness, or cyanosis  LABORATORY PANEL:   CBC  Recent Labs Lab 02/11/15 0345  WBC 11.8*  HGB 12.5  HCT 38.4  PLT 170  MCV 91.1  MCH 29.7  MCHC 32.6  RDW 14.9*  LYMPHSABS 1.4  MONOABS 0.9  EOSABS 0.0  BASOSABS 0.1   ------------------------------------------------------------------------------------------------------------------  Chemistries   Recent Labs Lab 02/11/15 0430  NA 135  K 5.4*  CL 103  CO2 22  GLUCOSE 324*  BUN 48*  CREATININE 2.29*  CALCIUM 8.1*  AST 1229*  ALT 833*  ALKPHOS 107  BILITOT 0.6   ------------------------------------------------------------------------------------------------------------------  Cardiac Enzymes No results  for input(s): TROPONINI in the last 168 hours. ------------------------------------------------------------------------------------------------------------------  RADIOLOGY: Ct Abdomen Pelvis Wo Contrast  02/11/2015   CLINICAL DATA:  Initial encounter for nausea vomiting that started last night. History of right-sided breast cancer.  EXAM: CT ABDOMEN AND PELVIS WITHOUT CONTRAST  TECHNIQUE: Multidetector CT imaging of the abdomen and pelvis was performed following the standard protocol without IV contrast.  COMPARISON:  05/18/2009.  FINDINGS: Lower chest: Compressive atelectasis noted in both lower lobes with tiny bilateral pleural effusions.  Hepatobiliary: The liver shows diffusely decreased attenuation suggesting steatosis. Patient is status post cholecystectomy. No intrahepatic or extrahepatic biliary dilation.  Pancreas: No focal mass lesion. No dilatation of the main duct. No intraparenchymal cyst. No peripancreatic edema.  Spleen: No splenomegaly. No focal mass lesion.  Adrenals/Urinary Tract: No adrenal nodule or mass. No evidence for urinary stones. No hydroureteronephrosis. No gross renal mass lesion on this study without intravenous contrast material. Foley catheter decompresses the urinary bladder and gas in the bladder lumen is compatible with the instrumentation.  Stomach/Bowel: Stomach is nondistended. No gastric wall thickening. No evidence of outlet obstruction. Duodenum is normally positioned as is the ligament of Treitz. No small bowel wall thickening. No small bowel dilatation. Terminal ileum normal. The appendix is not visualized, but there is no edema or inflammation in the region of the cecum. Diverticular changes are noted in the left colon without evidence of diverticulitis.  Vascular/Lymphatic: There is abdominal aortic atherosclerosis without aneurysm. Borderline gastrohepatic ligament lymphadenopathy is stable but decreased in the interval since the prior study, consistent with benign  process. There is no retroperitoneal lymphadenopathy.  Reproductive: Uterus is surgically absent.  No adnexal mass.  Other: No intraperitoneal free fluid.  Musculoskeletal: Bone windows reveal no worrisome lytic or sclerotic osseous lesions.  IMPRESSION: No acute findings in the abdomen or pelvis. Specifically, no findings to explain the patient's history of nausea and vomiting.  Colonic diverticulosis without diverticulitis.  Steatosis.   Electronically Signed   By: Misty Stanley M.D.   On: 02/11/2015 08:57   Ct Head Wo Contrast  02/11/2015   CLINICAL DATA:  Altered mental status, nausea and vomiting. Shoulder surgery two days prior.  EXAM: CT HEAD WITHOUT CONTRAST  TECHNIQUE: Contiguous axial images were obtained from the base of the skull through the vertex without intravenous contrast.  COMPARISON:  None.  FINDINGS: No intracranial hemorrhage, mass effect, or midline shift. No hydrocephalus. The basilar cisterns are patent. No evidence of territorial infarct. No intracranial fluid collection. Calvarium is intact. Included paranasal sinuses and mastoid air cells are well aerated.  IMPRESSION: No acute intracranial abnormality.   Electronically Signed   By: Jeb Levering M.D.   On: 02/11/2015 05:01   Dg Chest Portable 1 View  02/11/2015   CLINICAL  DATA:  Hypoxia, dyspnea. Nausea and vomiting. Recent shoulder surgery.  EXAM: PORTABLE CHEST - 1 VIEW  COMPARISON:  None.  FINDINGS: The heart is normal in size, there is atherosclerosis of the thoracic aorta. Bibasilar atelectasis, right greater than left. Mild vascular congestion without edema. No confluent airspace disease, pleural effusion, or pneumothorax. No acute osseous abnormalities are seen.  IMPRESSION: Mild vascular congestion with bibasilar atelectasis.   Electronically Signed   By: Jeb Levering M.D.   On: 02/11/2015 04:45    EKG: No orders found for this or any previous visit.  IMPRESSION AND PLAN:  Principal Problem:   CHF (congestive  heart failure) Active Problems:   ARF (acute renal failure)   HTN (hypertension)   Diabetes   OSA on CPAP   COPD (chronic obstructive pulmonary disease)   Former smoker   Rotator cuff rupture   Hyperlipidemia   GERD (gastroesophageal reflux disease)   PUD (peptic ulcer disease)   Dysphagia 1. Acute congestive heart failure with pulmonary edema in the setting of acute renal failure. Admit patient to medical floor, stop ACE inhibitor. Get cardiology consultation. Follow with therapy. Oxygen therapy at present, get echocardiogram 2.  Acute renal failure of unclear etiology, possibly related to acute urinary retention status post Foley catheter placement, no obvious UTI on urine analysis, continue low rate IV fluids and holding ACE inhibitor at present, get nephrology consultation. CT scan showed no hydronephrosis 3. Acute hepatitis of unclear etiology. CK levels are unremarkable. CT scan of the abdomen also is unremarkable. We will be getting gastroenterology consultation and ammonia level 4. Hyperkalemia, continue IV fluids seemed to be improving somewhat. We will give Kayexalate if needed. 5. Nausea and vomiting. IV fluids, symptomatic therapy   All the records are reviewed and case discussed with ED provider. Management plans discussed with the patient, family and they are in agreement.  CODE STATUS:    TOTAL TIME TAKING CARE OF THIS PATIENT: 60 minutes.    Theodoro Grist M.D on 02/11/2015 at 10:48 AM  Between 7am to 6pm - Pager - [phone] After 6pm go to [email or web address] - password [email or web address] [facility]  [facility]  Office  [phone]  CC: Primary care physician; Adrian Prows, MD

## 2015-02-11 NOTE — Progress Notes (Signed)
Initial Nutrition Assessment  DOCUMENTATION CODES:     INTERVENTION:   (Meals and Snacks: Cater to patient preferences)  NUTRITION DIAGNOSIS:   (No nutrition concerns at this time)     GOAL:  Patient will meet greater than or equal to 90% of their needs    MONITOR:   (Energy Intake, I/O's, Electrolyte and renal Profile, Glucose Profile)  REASON FOR ASSESSMENT:   (RD Screen- Diagnosis)    ASSESSMENT:  Reason For Admission: CHF PMHx:  Past Medical History  Diagnosis Date  . Ulcer, stomach peptic     history of  . Diabetes mellitus without complication   . Hypercholesteremia   . Esophageal reflux   . Hyperlipidemia   . Hypertension   . Arthritis     knees  . Degenerative lumbar disc   . Barrett's esophagus   . History of Helicobacter pylori infection   . Dysphagia   . COPD (chronic obstructive pulmonary disease) 2012  . Cancer 2014    right breast     Typical Fluid/ Food Intake: new admission no intake recorded Meal/ Snack Patterns: Patient reports a good appetite and intake of a regular diet with no restrictions PTA.   Supplements: None  Labs:  Electrolyte and Renal Profile:  Recent Labs Lab 02/11/15 0345 02/11/15 0430  BUN 50* 48*  CREATININE 2.35* 2.29*  NA 133* 135  K 6.7* 5.4*   Protein Profile:  Recent Labs Lab 02/11/15 0345 02/11/15 0430  ALBUMIN 3.8 3.7   Glucose Profile:  Recent Labs  02/09/15 0611 02/09/15 1020 02/11/15 0810  GLUCAP 207* 195* 218*    Meds: Colace, Novolog, NS @ 46m/ hr  Physical Findings: n/a Weight Changes: Patient reports a UBW of 200-215#. Current weight represents weight gain, likely related to fluid as output is at 2L currently.   Height:  Ht Readings from Last 1 Encounters:  02/11/15 '5\' 4"'$  (1.626 m)    Weight:  Wt Readings from Last 1 Encounters:  02/11/15 230 lb 12.8 oz (104.69 kg)    Ideal Body Weight:     Wt Readings from Last 10 Encounters:  02/11/15 230 lb 12.8 oz (104.69  kg)    BMI:  Body mass index is 39.6 kg/(m^2).  Skin:  Reviewed, no issues  Diet Order:  Diet Carb Modified Fluid consistency:: Thin; Room service appropriate?: Yes  EDUCATION NEEDS:  No education needs identified at this time   Intake/Output Summary (Last 24 hours) at 02/11/15 1503 Last data filed at 02/11/15 1139  Gross per 24 hour  Intake      0 ml  Output   2000 ml  Net  -2000 ml    Last BM:  New admission  TRoda Shutters RDN Pager: [phone]: 7[place]

## 2015-02-11 NOTE — ED Notes (Addendum)
Pt reports surgery Tuesday. Tonight pt has nausea and vomiting. Pt is reluctant to open her eyes and is groggy with slurred speech. Pt has has pin point pupils.

## 2015-02-12 LAB — GLUCOSE, CAPILLARY
GLUCOSE-CAPILLARY: 216 mg/dL — AB (ref 65–99)
Glucose-Capillary: 222 mg/dL — ABNORMAL HIGH (ref 65–99)
Glucose-Capillary: 206 mg/dL — ABNORMAL HIGH (ref 65–99)
Glucose-Capillary: 257 mg/dL — ABNORMAL HIGH (ref 65–99)

## 2015-02-12 LAB — COMPREHENSIVE METABOLIC PANEL
ALBUMIN: 3.4 g/dL — AB (ref 3.5–5.0)
ALK PHOS: 103 U/L (ref 38–126)
ALT: 856 U/L — ABNORMAL HIGH (ref 14–54)
AST: 636 U/L — AB (ref 15–41)
Anion gap: 8 (ref 5–15)
BILIRUBIN TOTAL: 0.8 mg/dL (ref 0.3–1.2)
BUN: 17 mg/dL (ref 6–20)
CO2: 30 mmol/L (ref 22–32)
CREATININE: 0.76 mg/dL (ref 0.44–1.00)
Calcium: 8.2 mg/dL — ABNORMAL LOW (ref 8.9–10.3)
Chloride: 104 mmol/L (ref 101–111)
GFR calc Af Amer: >60 mL/min (ref >60)
Glucose, Bld: 216 mg/dL — ABNORMAL HIGH (ref 65–99)
POTASSIUM: 3.5 mmol/L (ref 3.5–5.1)
SODIUM: 142 mmol/L (ref 135–145)
TOTAL PROTEIN: 6.3 g/dL — AB (ref 6.5–8.1)

## 2015-02-12 LAB — BILIRUBIN, DIRECT: BILIRUBIN DIRECT: 0.2 mg/dL (ref 0.1–0.5)

## 2015-02-12 LAB — PROTIME-INR
INR: 1.2
PROTHROMBIN TIME: 15.4 s — AB (ref 11.4–15.0)

## 2015-02-12 LAB — HEMOGLOBIN A1C: Hgb A1c MFr Bld: 7.4 % — ABNORMAL HIGH (ref 4.0–6.0)

## 2015-02-12 MED ORDER — ONDANSETRON HCL 4 MG/2ML IJ SOLN
4.0000 mg | Freq: Four times a day (QID) | INTRAMUSCULAR | Status: DC | PRN
  Administered 2015-02-12: 4 mg via INTRAVENOUS
  Filled 2015-02-12: qty 2

## 2015-02-12 MED ORDER — FUROSEMIDE 10 MG/ML IJ SOLN
20.0000 mg | Freq: Once | INTRAMUSCULAR | Status: AC
  Administered 2015-02-12: 20 mg via INTRAVENOUS
  Filled 2015-02-12: qty 2

## 2015-02-12 MED ORDER — OXYCODONE HCL 5 MG PO TABS: 5.0000 mg | ORAL_TABLET | ORAL | Status: DC | PRN

## 2015-02-12 MED ORDER — NALOXONE HCL 0.4 MG/ML IJ SOLN
0.4000 mg | Freq: Once | INTRAMUSCULAR | Status: AC
  Administered 2015-02-12: 0.4 mg via INTRAVENOUS
  Filled 2015-02-12: qty 1

## 2015-02-12 MED ORDER — LACTULOSE 10 GM/15ML PO SOLN
30.0000 g | Freq: Every day | ORAL | Status: DC
  Administered 2015-02-12 – 2015-02-14 (×2): 30 g via ORAL
  Filled 2015-02-12 (×3): qty 60

## 2015-02-12 NOTE — Progress Notes (Signed)
Dr Leslye Peer notified of notes regarding IV lasix from Dr Nehemiah Massed, will be by to assess pt today.  Lynnda Shields, RN

## 2015-02-12 NOTE — Consult Note (Signed)
GI Inpatient Consult Note-consult from 02/11/2015 dictated 02/12/2015 Lollie Sails MD  Reason for Consult: Abnormal liver enzymes/ hepatitis. 3 is obtained from both the patient and her daughter who is in the room and is a Environmental consultant caretaker for the patient's husband.   Attending Requesting Consult: Dr. Earleen Newport  Outpatient Primary Physician: Dr. Adrian Prows  History of Present Illness: Tammy Hatfield is a 69 y.o. female who is brought to the emergency room with some change of mental status and acute congestive heart failure. Evaluation in the emergency room indicated that she had acute renal failure and elevated liver associated enzymes. There was a little elevation of ammonia.  2 days prior to admission patient underwent a right shoulder arthroscopy 12/17/2018 muscle. Went home on pain medications had taken some Percocet several times over the course of the day. Iron 2 admission she was very sleepy R Jake. He did have some nausea as well. She's states that she has been fatigued for a couple of weeks. She is supposed be wearing a CPAP at night but wears this irregularly. Evening before her coming to the hospital she did have a bout of emesis this continued until that day at 3 AM when she came to the hospital. Her daughter stated that she was very lethargic and was hallucinating at times. He had a loose bowel movement earlier today but this is not regular for her. He also has a recent diagnosis of COPD.  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 He states that her husband has hepatitis C liver he was diagnosed with hepatitis prior to there being a test for hepatitis C. 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 He has a tattoo that was placed about 19 years ago. She has no military foreign travel or incarceration. History of intravenous drug abuse. He does have a history of alcohol use over 2 drinks a  day for at least 10 years. Her mother had problems with alcohol related cirrhosis. She's never had a job related with toxins or toxin exposure. She's never been told she had hepatitis. He is never had an episode of jaundice.  Past Medical History:  Past Medical History  Diagnosis Date  . Ulcer, stomach peptic     history of  . Diabetes mellitus without complication   . Hypercholesteremia   . Esophageal reflux   . Hyperlipidemia   . Hypertension   . Arthritis     knees  . Degenerative lumbar disc   . Barrett's esophagus   . History of Helicobacter pylori infection   . Dysphagia   . COPD (chronic obstructive pulmonary disease) 2012  . Cancer 2014    right breast     Problem List: Patient Active Problem List   Diagnosis Date Noted  . CHF (congestive heart failure) 02/11/2015  . OSA on CPAP 02/11/2015  . COPD (chronic obstructive pulmonary disease) 02/11/2015  . Former smoker 02/11/2015  . HTN (hypertension) 02/11/2015  . Diabetes 02/11/2015  . Rotator cuff rupture 02/11/2015  . Hyperlipidemia 02/11/2015  . GERD (gastroesophageal reflux disease) 02/11/2015  . PUD (peptic ulcer disease) 02/11/2015  . Dysphagia 02/11/2015  . ARF (acute renal failure) 02/11/2015    Past Surgical History: Past Surgical History  Procedure Laterality Date  . Uvulopalatopharyngoplasty N/A 2001  . Abdominal hysterectomy    . Laparoscopic tubal ligation Bilateral 1967  . Umbilical hernia repair N/A   . Bunionectomy  Bilateral 1980  . Breast lumpectomy Right 2013  . Shoulder arthroscopy with rotator cuff repair Right 02/09/2015    Procedure: SHOULDER ARTHROSCOPY WITH ROTATOR CUFF REPAIR,release long head biceps tendon,subacromial decompression.;  Surgeon: Leanor Kail, MD;  Location: [facility] ORS;  Service: Orthopedics;  Laterality: Right;    Allergies: Allergies  Allergen Reactions  . Metformin And Related Diarrhea  . Amoxicillin Other (See Comments)    Reaction: Yeast infection  . Avelox  [Moxifloxacin] Other (See Comments)    Reaction: Muscle pain  . Benazepril Cough  . Byetta 10 Mcg Pen [Exenatide] Diarrhea  . Neurontin [Gabapentin] Other (See Comments)    Reaction: Mouth blister, joint pain and depression  . Statins Other (See Comments)    Reaction: Mouth blisters and joint pain  . Sulfa Antibiotics Other (See Comments)    Reaction: Headache  . Ceclor [Cefaclor] Rash    Home Medications: Prescriptions prior to admission  Medication Sig Dispense Refill Last Dose  . albuterol (PROVENTIL HFA;VENTOLIN HFA) 108 (90 BASE) MCG/ACT inhaler Inhale 2 puffs into the lungs every 4 (four) hours as needed for wheezing or shortness of breath.   PRN at PRN  . ALPRAZolam (XANAX) 0.5 MG tablet Take 0.5 mg by mouth 2 (two) times daily as needed for anxiety.   PRN at PRN  . budesonide-formoterol (SYMBICORT) 160-4.5 MCG/ACT inhaler Inhale 2 puffs into the lungs 2 (two) times daily as needed (for shortness of breath).    PRN at PRN  . glipiZIDE (GLUCOTROL) 10 MG tablet Take 10 mg by mouth daily before breakfast.   unknown at unknown  . insulin aspart protamine- aspart (NOVOLOG MIX 70/30) (70-30) 100 UNIT/ML injection Inject 40-60 Units into the skin 2 (two) times daily. Inject 60 units into the skin daily with breakfast. Inject 40 units into the skin daily with supper.   unknown at unknown  . loratadine (CLARITIN) 10 MG tablet Take 10 mg by mouth every morning.    unknown at unknown  . losartan (COZAAR) 50 MG tablet Take 50 mg by mouth every morning.    unknown at unknown  . omeprazole (PRILOSEC) 20 MG capsule Take 40 mg by mouth every morning.    unknown at unknown  . PARoxetine (PAXIL) 40 MG tablet Take 40 mg by mouth every morning.   unknown at unknown  . ROXICET 5-325 MG per tablet Take 1-2 tablets by mouth every 4 (four) hours as needed for severe pain. 30 tablet 0 unknown at unknown  . traMADol (ULTRAM) 50 MG tablet Take 50 mg by mouth every 6 (six) hours as needed for moderate pain.     unknown at unknown   Home medication reconciliation was completed with the patient.   Scheduled Inpatient Medications:   . docusate sodium  100 mg Oral BID  . heparin  5,000 Units Subcutaneous 3 times per day  . insulin aspart  0-15 Units Subcutaneous TID WC  . insulin aspart  0-5 Units Subcutaneous QHS  . insulin aspart  3 Units Subcutaneous TID WC  . lactulose  30 g Oral Daily  . loratadine  10 mg Oral BH-q7a  . pantoprazole  40 mg Oral Daily  . PARoxetine  40 mg Oral BH-q7a  . rifaximin  550 mg Oral BID    Continuous Inpatient Infusions:     PRN Inpatient Medications:  albuterol, ALPRAZolam, budesonide-formoterol, haloperidol lactate, ondansetron (ZOFRAN) IV  Family History: family history is not on file.  GI Family History: There is a strong family history of  colon cancer with patient's mother as well as a a maternal aunt  Social History:   reports that she quit smoking about 15 years ago. She does not have any smokeless tobacco history on file. She reports that she does not drink alcohol or use illicit drugs. The patient denies ETOH, tobacco, or drug use.   ROS patient had her last EGD and colonoscopy on 04/12/2014 for Barrett's esophagus biopsies being negative that time and esophageal dilatation for symptomatic dysphagia, she had multiple colon polyps several of which were tubular adenomas in the setting of family history of colon cancer in a primary relative.  Review of Systems: Per admission history and physical [place] with same  Physical Examination: BP 166/68 mmHg  Pulse 78  Temp(Src) 99.7 F (37.6 C) (Oral)  Resp 18  Ht '5\' 4"'$  (1.626 m)  Wt 103.54 kg (228 lb 4.2 oz)  BMI 39.16 kg/m2  SpO2 99% Gen: Elderly-appearing obese Caucasian female somnolent HEENT: Normocephalic atraumatic eyes are anicteric Neck: No apparent lymphadenopathy Chest: Mild distant rhonchi and wheezing CV: Regular rate and rhythm Abd: Obese nontender bowel sounds are positive and  normoactive unable to palpate internal organs Ext:  Skin: Other:  Data: Lab Results  Component Value Date   WBC 6.8 02/11/2015   HGB 11.2* 02/11/2015   HCT 34.3* 02/11/2015   MCV 91.0 02/11/2015   PLT 148* 02/11/2015    Recent Labs Lab 02/11/15 0345 02/11/15 1443  HGB 12.5 11.2*   Lab Results  Component Value Date   NA 142 02/12/2015   K 3.5 02/12/2015   CL 104 02/12/2015   CO2 30 02/12/2015   BUN 17 02/12/2015   CREATININE 0.76 02/12/2015   Lab Results  Component Value Date   ALT 856* 02/12/2015   AST 636* 02/12/2015   ALKPHOS 103 02/12/2015   BILITOT 0.8 02/12/2015    Recent Labs Lab 02/12/15 0433  INR 1.20   CBC Latest Ref Rng 02/11/2015 02/11/2015 02/01/2015  WBC 3.6 - 11.0 K/uL 6.8 11.8(H) 7.3  Hemoglobin 12.0 - 16.0 g/dL 11.2(L) 12.5 12.7  Hematocrit 35.0 - 47.0 % 34.3(L) 38.4 37.9  Platelets 150 - 440 K/uL 148(L) 170 189    STUDIES: Ct Abdomen Pelvis Wo Contrast  02/11/2015   CLINICAL DATA:  Initial encounter for nausea vomiting that started last night. History of right-sided breast cancer.  EXAM: CT ABDOMEN AND PELVIS WITHOUT CONTRAST  TECHNIQUE: Multidetector CT imaging of the abdomen and pelvis was performed following the standard protocol without IV contrast.  COMPARISON:  05/18/2009.  FINDINGS: Lower chest: Compressive atelectasis noted in both lower lobes with tiny bilateral pleural effusions.  Hepatobiliary: The liver shows diffusely decreased attenuation suggesting steatosis. Patient is status post cholecystectomy. No intrahepatic or extrahepatic biliary dilation.  Pancreas: No focal mass lesion. No dilatation of the main duct. No intraparenchymal cyst. No peripancreatic edema.  Spleen: No splenomegaly. No focal mass lesion.  Adrenals/Urinary Tract: No adrenal nodule or mass. No evidence for urinary stones. No hydroureteronephrosis. No gross renal mass lesion on this study without intravenous contrast material. Foley catheter decompresses the urinary  bladder and gas in the bladder lumen is compatible with the instrumentation.  Stomach/Bowel: Stomach is nondistended. No gastric wall thickening. No evidence of outlet obstruction. Duodenum is normally positioned as is the ligament of Treitz. No small bowel wall thickening. No small bowel dilatation. Terminal ileum normal. The appendix is not visualized, but there is no edema or inflammation in the region of the cecum. Diverticular changes are noted  in the left colon without evidence of diverticulitis.  Vascular/Lymphatic: There is abdominal aortic atherosclerosis without aneurysm. Borderline gastrohepatic ligament lymphadenopathy is stable but decreased in the interval since the prior study, consistent with benign process. There is no retroperitoneal lymphadenopathy.  Reproductive: Uterus is surgically absent.  No adnexal mass.  Other: No intraperitoneal free fluid.  Musculoskeletal: Bone windows reveal no worrisome lytic or sclerotic osseous lesions.  IMPRESSION: No acute findings in the abdomen or pelvis. Specifically, no findings to explain the patient's history of nausea and vomiting.  Colonic diverticulosis without diverticulitis.  Steatosis.   Electronically Signed   By: Misty Stanley M.D.   On: 02/11/2015 08:57   Ct Head Wo Contrast  02/11/2015   CLINICAL DATA:  Altered mental status, nausea and vomiting. Shoulder surgery two days prior.  EXAM: CT HEAD WITHOUT CONTRAST  TECHNIQUE: Contiguous axial images were obtained from the base of the skull through the vertex without intravenous contrast.  COMPARISON:  None.  FINDINGS: No intracranial hemorrhage, mass effect, or midline shift. No hydrocephalus. The basilar cisterns are patent. No evidence of territorial infarct. No intracranial fluid collection. Calvarium is intact. Included paranasal sinuses and mastoid air cells are well aerated.  IMPRESSION: No acute intracranial abnormality.   Electronically Signed   By: Jeb Levering M.D.   On: 02/11/2015 05:01    Dg Chest Portable 1 View  02/11/2015   CLINICAL DATA:  Hypoxia, dyspnea. Nausea and vomiting. Recent shoulder surgery.  EXAM: PORTABLE CHEST - 1 VIEW  COMPARISON:  None.  FINDINGS: The heart is normal in size, there is atherosclerosis of the thoracic aorta. Bibasilar atelectasis, right greater than left. Mild vascular congestion without edema. No confluent airspace disease, pleural effusion, or pneumothorax. No acute osseous abnormalities are seen.  IMPRESSION: Mild vascular congestion with bibasilar atelectasis.   Electronically Signed   By: Jeb Levering M.D.   On: 02/11/2015 04:45   '@IMAGES'$ @  Assessment: 1. Abnormal liver enzymes in the setting of hospitalization with congestive heart failure, acute renal failure, recent shoulder surgery and possible medication overdose. Likely some amount of congestive hepatopathy with probable baseline fatty liver. It is of note her CT scan reinforces fatty liver. There were no lesions in the liver otherwise. He does have several risk factors for liver disease including husband with hepatitis C? Daily Tylenol use remote alcohol use and remote tattoo.  Recommendations: 1. We'll check a pro-time tonight acetaminophen level, acetylsalicylic acid level, acute hepatitis serologies/chronic hepatitis serologies. Further recommendations to follow.  Thank you for the consult. Please call with questions or concerns.  Lollie Sails, MD  02/12/2015 9:48 PM

## 2015-02-12 NOTE — Consult Note (Signed)
Subjective: Patient seen for abnormal liver testing. Patient denies nausea or abdominal pain.  Objective: Vital signs in last 24 hours: Temp:  [98.2 F (36.8 C)-99.7 F (37.6 C)] 99.7 F (37.6 C) (06/04 1953) Pulse Rate:  [78] 78 (06/04 1953) Resp:  [18] 18 (06/04 1953) BP: (160-166)/(68-72) 166/68 mmHg (06/04 1953) SpO2:  [96 %-99 %] 99 % (06/04 1953) Weight:  [103.54 kg (228 lb 4.2 oz)] 103.54 kg (228 lb 4.2 oz) (06/04 0537) Blood pressure 166/68, pulse 78, temperature 99.7 F (37.6 C), temperature source Oral, resp. rate 18, height '5\' 4"'$  (1.626 m), weight 103.54 kg (228 lb 4.2 oz), SpO2 99 %.   Intake/Output from previous day: 06/03 0701 - 06/04 0700 In: -  Out: 3700 [Urine:3700]  Intake/Output this shift: Total I/O In: -  Out: 1500 [Urine:1500]   General appearance:  More alert than yesterday appears more comfortable Resp:  Occasional wheeze laterally Cardio:  Regular rate and rhythm GI:  Nontender nondistended bowel sounds positive Extremities:  1+ lower extremity edema   Lab Results: Results for orders placed or performed during the hospital encounter of 02/11/15 (from the past 24 hour(s))  Comprehensive metabolic panel     Status: Abnormal   Collection Time: 02/12/15  4:33 AM  Result Value Ref Range   Sodium 142 135 - 145 mmol/L   Potassium 3.5 3.5 - 5.1 mmol/L   Chloride 104 101 - 111 mmol/L   CO2 30 22 - 32 mmol/L   Glucose, Bld 216 (H) 65 - 99 mg/dL   BUN 17 6 - 20 mg/dL   Creatinine, Ser 0.76 0.44 - 1.00 mg/dL   Calcium 8.2 (L) 8.9 - 10.3 mg/dL   Total Protein 6.3 (L) 6.5 - 8.1 g/dL   Albumin 3.4 (L) 3.5 - 5.0 g/dL   AST 636 (H) 15 - 41 U/L   ALT 856 (H) 14 - 54 U/L   Alkaline Phosphatase 103 38 - 126 U/L   Total Bilirubin 0.8 0.3 - 1.2 mg/dL   GFR calc non Af Amer >60 >60 mL/min   GFR calc Af Amer >60 >60 mL/min   Anion gap 8 5 - 15  Bilirubin, direct     Status: None   Collection Time: 02/12/15  4:33 AM  Result Value Ref Range   Bilirubin,  Direct 0.2 0.1 - 0.5 mg/dL  Protime-INR     Status: Abnormal   Collection Time: 02/12/15  4:33 AM  Result Value Ref Range   Prothrombin Time 15.4 (H) 11.4 - 15.0 seconds   INR 1.20   Glucose, capillary     Status: Abnormal   Collection Time: 02/12/15  7:36 AM  Result Value Ref Range   Glucose-Capillary 222 (H) 65 - 99 mg/dL   Comment 1 Notify RN   Glucose, capillary     Status: Abnormal   Collection Time: 02/12/15 12:06 PM  Result Value Ref Range   Glucose-Capillary 216 (H) 65 - 99 mg/dL   Comment 1 Notify RN   Glucose, capillary     Status: Abnormal   Collection Time: 02/12/15  4:15 PM  Result Value Ref Range   Glucose-Capillary 206 (H) 65 - 99 mg/dL   Comment 1 Notify RN   Glucose, capillary     Status: Abnormal   Collection Time: 02/12/15  7:57 PM  Result Value Ref Range   Glucose-Capillary 257 (H) 65 - 99 mg/dL      Recent Labs  02/11/15 0345 02/11/15 1443  WBC 11.8* 6.8  HGB  12.5 11.2*  HCT 38.4 34.3*  PLT 170 148*   BMET  Recent Labs  02/11/15 0430 02/11/15 1443 02/12/15 0433  NA 135 137 142  K 5.4* 4.3 3.5  CL 103 103 104  CO2 '22 28 30  '$ GLUCOSE 324* 224* 216*  BUN 48* 31* 17  CREATININE 2.29* 1.28*  1.37* 0.76  CALCIUM 8.1* 7.9* 8.2*   LFT  Recent Labs  02/12/15 0433  PROT 6.3*  ALBUMIN 3.4*  AST 636*  ALT 856*  ALKPHOS 103  BILITOT 0.8  BILIDIR 0.2   PT/INR  Recent Labs  02/11/15 2006 02/12/15 0433  LABPROT 15.3* 15.4*  INR 1.19 1.20   Hepatitis Panel No results for input(s): HEPBSAG, HCVAB, HEPAIGM, HEPBIGM in the last 72 hours. C-Diff No results for input(s): CDIFFTOX in the last 72 hours. No results for input(s): CDIFFPCR in the last 72 hours.   Studies/Results: Ct Abdomen Pelvis Wo Contrast  02/11/2015   CLINICAL DATA:  Initial encounter for nausea vomiting that started last night. History of right-sided breast cancer.  EXAM: CT ABDOMEN AND PELVIS WITHOUT CONTRAST  TECHNIQUE: Multidetector CT imaging of the abdomen and  pelvis was performed following the standard protocol without IV contrast.  COMPARISON:  05/18/2009.  FINDINGS: Lower chest: Compressive atelectasis noted in both lower lobes with tiny bilateral pleural effusions.  Hepatobiliary: The liver shows diffusely decreased attenuation suggesting steatosis. Patient is status post cholecystectomy. No intrahepatic or extrahepatic biliary dilation.  Pancreas: No focal mass lesion. No dilatation of the main duct. No intraparenchymal cyst. No peripancreatic edema.  Spleen: No splenomegaly. No focal mass lesion.  Adrenals/Urinary Tract: No adrenal nodule or mass. No evidence for urinary stones. No hydroureteronephrosis. No gross renal mass lesion on this study without intravenous contrast material. Foley catheter decompresses the urinary bladder and gas in the bladder lumen is compatible with the instrumentation.  Stomach/Bowel: Stomach is nondistended. No gastric wall thickening. No evidence of outlet obstruction. Duodenum is normally positioned as is the ligament of Treitz. No small bowel wall thickening. No small bowel dilatation. Terminal ileum normal. The appendix is not visualized, but there is no edema or inflammation in the region of the cecum. Diverticular changes are noted in the left colon without evidence of diverticulitis.  Vascular/Lymphatic: There is abdominal aortic atherosclerosis without aneurysm. Borderline gastrohepatic ligament lymphadenopathy is stable but decreased in the interval since the prior study, consistent with benign process. There is no retroperitoneal lymphadenopathy.  Reproductive: Uterus is surgically absent.  No adnexal mass.  Other: No intraperitoneal free fluid.  Musculoskeletal: Bone windows reveal no worrisome lytic or sclerotic osseous lesions.  IMPRESSION: No acute findings in the abdomen or pelvis. Specifically, no findings to explain the patient's history of nausea and vomiting.  Colonic diverticulosis without diverticulitis.  Steatosis.    Electronically Signed   By: Misty Stanley M.D.   On: 02/11/2015 08:57   Ct Head Wo Contrast  02/11/2015   CLINICAL DATA:  Altered mental status, nausea and vomiting. Shoulder surgery two days prior.  EXAM: CT HEAD WITHOUT CONTRAST  TECHNIQUE: Contiguous axial images were obtained from the base of the skull through the vertex without intravenous contrast.  COMPARISON:  None.  FINDINGS: No intracranial hemorrhage, mass effect, or midline shift. No hydrocephalus. The basilar cisterns are patent. No evidence of territorial infarct. No intracranial fluid collection. Calvarium is intact. Included paranasal sinuses and mastoid air cells are well aerated.  IMPRESSION: No acute intracranial abnormality.   Electronically Signed   By: Threasa Beards  Ehinger M.D.   On: 02/11/2015 05:01   Dg Chest Portable 1 View  02/11/2015   CLINICAL DATA:  Hypoxia, dyspnea. Nausea and vomiting. Recent shoulder surgery.  EXAM: PORTABLE CHEST - 1 VIEW  COMPARISON:  None.  FINDINGS: The heart is normal in size, there is atherosclerosis of the thoracic aorta. Bibasilar atelectasis, right greater than left. Mild vascular congestion without edema. No confluent airspace disease, pleural effusion, or pneumothorax. No acute osseous abnormalities are seen.  IMPRESSION: Mild vascular congestion with bibasilar atelectasis.   Electronically Signed   By: Jeb Levering M.D.   On: 02/11/2015 04:45    Scheduled Inpatient Medications:   . docusate sodium  100 mg Oral BID  . heparin  5,000 Units Subcutaneous 3 times per day  . insulin aspart  0-15 Units Subcutaneous TID WC  . insulin aspart  0-5 Units Subcutaneous QHS  . insulin aspart  3 Units Subcutaneous TID WC  . lactulose  30 g Oral Daily  . loratadine  10 mg Oral BH-q7a  . pantoprazole  40 mg Oral Daily  . PARoxetine  40 mg Oral BH-q7a  . rifaximin  550 mg Oral BID    Continuous Inpatient Infusions:     PRN Inpatient Medications:  albuterol, ALPRAZolam, budesonide-formoterol,  haloperidol lactate, ondansetron (ZOFRAN) IV  Miscellaneous:   Assessment:  1. Abnormal liver enzymes. Liver enzymes are in a hepatocellular pattern and have improved overnight. They're not yet normal but trending that way. Her pro time is stable.  Plan:  1. Multiple labs in regard to hepatitis serology still pending. Echocardiogram result noted with good ejection fraction but moderate tricuspid regurgitation. 2. Daily LFTs and pro time  Lollie Sails MD 02/12/2015, 10:09 PM

## 2015-02-12 NOTE — Progress Notes (Signed)
[facility] Encounter Note  Patient: Tammy Hatfield / Admit Date: 02/11/2015 / Date of Encounter: 02/12/2015, 8:33 AM   Subjective: Shortness of breath and shoulder pain  Review of Systems: Positive for: Shortness of breath Negative for: Vision change, hearing change, syncope, dizziness, nausea, vomiting,diarrhea, bloody stool, stomach pain, cough, congestion, diaphoresis, urinary frequency, urinary pain,skin lesions, skin rashes Others previously listed  Objective: Telemetry: Normal sinus rhythm Physical Exam: Blood pressure 132/84, pulse 86, temperature 99.2 F (37.3 C), temperature source Oral, resp. rate 18, height '5\' 4"'$  (1.626 m), weight 228 lb 4.2 oz (103.54 kg), SpO2 94 %. Body mass index is 39.16 kg/(m^2). General: Well developed, well nourished, in no acute distress. Head: Normocephalic, atraumatic, sclera non-icteric, no xanthomas, nares are without discharge. Neck: No apparent masses Lungs: Normal respirations with diffuse wheezes, no rhonchi, no rales , nasal or crackles   Heart: Regular rate and rhythm, normal S1 S2, no murmur, no rub, no gallop, PMI is normal size and placement, carotid upstroke normal without bruit, jugular venous pressure normal Abdomen: Soft, non-tender, non-distended with normoactive bowel sounds. No hepatosplenomegaly. Abdominal aorta is normal size without bruit Extremities: Trace edema, no clubbing, no cyanosis, no ulcers,  Peripheral: 2+ radial, 2+ femoral, 2+ dorsal pedal pulses Neuro: Alert and oriented. Moves all extremities spontaneously. Psych:  Responds to questions appropriately with a normal affect.   Intake/Output Summary (Last 24 hours) at 02/12/15 0833 Last data filed at 02/12/15 0752  Gross per 24 hour  Intake 800.67 ml  Output   3700 ml  Net -2899.33 ml    Inpatient Medications:  . docusate sodium  100 mg Oral BID  . heparin  5,000 Units Subcutaneous 3 times per day  . insulin aspart  0-15 Units  Subcutaneous TID WC  . insulin aspart  0-5 Units Subcutaneous QHS  . insulin aspart  3 Units Subcutaneous TID WC  . loratadine  10 mg Oral BH-q7a  . pantoprazole  40 mg Oral Daily  . PARoxetine  40 mg Oral BH-q7a  . rifaximin  550 mg Oral BID   Infusions:    Labs:  Recent Labs  02/11/15 1443 02/12/15 0433  NA 137 142  K 4.3 3.5  CL 103 104  CO2 28 30  GLUCOSE 224* 216*  BUN 31* 17  CREATININE 1.28*  1.37* 0.76  CALCIUM 7.9* 8.2*    Recent Labs  02/11/15 0430 02/12/15 0433  AST 1229* 636*  ALT 833* 856*  ALKPHOS 107 103  BILITOT 0.6 0.8  PROT 6.8 6.3*  ALBUMIN 3.7 3.4*    Recent Labs  02/11/15 0345 02/11/15 1443  WBC 11.8* 6.8  NEUTROABS 9.5*  --   HGB 12.5 11.2*  HCT 38.4 34.3*  MCV 91.1 91.0  PLT 170 148*    Recent Labs  02/11/15 0430  CKTOTAL 169   Invalid input(s): POCBNP  Recent Labs  02/11/15 1443  HGBA1C 7.4*     Weights: Filed Weights   02/11/15 1458 02/11/15 1502 02/12/15 0537  Weight: 230 lb 12.8 oz (104.69 kg) 230 lb 12.8 oz (104.69 kg) 228 lb 4.2 oz (103.54 kg)     Radiology/Studies:  Ct Abdomen Pelvis Wo Contrast  02/11/2015   CLINICAL DATA:  Initial encounter for nausea vomiting that started last night. History of right-sided breast cancer.  EXAM: CT ABDOMEN AND PELVIS WITHOUT CONTRAST  TECHNIQUE: Multidetector CT imaging of the abdomen and pelvis was performed following the standard protocol without IV contrast.  COMPARISON:  05/18/2009.  FINDINGS: Lower chest: Compressive atelectasis noted in both lower lobes with tiny bilateral pleural effusions.  Hepatobiliary: The liver shows diffusely decreased attenuation suggesting steatosis. Patient is status post cholecystectomy. No intrahepatic or extrahepatic biliary dilation.  Pancreas: No focal mass lesion. No dilatation of the main duct. No intraparenchymal cyst. No peripancreatic edema.  Spleen: No splenomegaly. No focal mass lesion.  Adrenals/Urinary Tract: No adrenal nodule or  mass. No evidence for urinary stones. No hydroureteronephrosis. No gross renal mass lesion on this study without intravenous contrast material. Foley catheter decompresses the urinary bladder and gas in the bladder lumen is compatible with the instrumentation.  Stomach/Bowel: Stomach is nondistended. No gastric wall thickening. No evidence of outlet obstruction. Duodenum is normally positioned as is the ligament of Treitz. No small bowel wall thickening. No small bowel dilatation. Terminal ileum normal. The appendix is not visualized, but there is no edema or inflammation in the region of the cecum. Diverticular changes are noted in the left colon without evidence of diverticulitis.  Vascular/Lymphatic: There is abdominal aortic atherosclerosis without aneurysm. Borderline gastrohepatic ligament lymphadenopathy is stable but decreased in the interval since the prior study, consistent with benign process. There is no retroperitoneal lymphadenopathy.  Reproductive: Uterus is surgically absent.  No adnexal mass.  Other: No intraperitoneal free fluid.  Musculoskeletal: Bone windows reveal no worrisome lytic or sclerotic osseous lesions.  IMPRESSION: No acute findings in the abdomen or pelvis. Specifically, no findings to explain the patient's history of nausea and vomiting.  Colonic diverticulosis without diverticulitis.  Steatosis.   Electronically Signed   By: Misty Stanley M.D.   On: 02/11/2015 08:57   Ct Head Wo Contrast  02/11/2015   CLINICAL DATA:  Altered mental status, nausea and vomiting. Shoulder surgery two days prior.  EXAM: CT HEAD WITHOUT CONTRAST  TECHNIQUE: Contiguous axial images were obtained from the base of the skull through the vertex without intravenous contrast.  COMPARISON:  None.  FINDINGS: No intracranial hemorrhage, mass effect, or midline shift. No hydrocephalus. The basilar cisterns are patent. No evidence of territorial infarct. No intracranial fluid collection. Calvarium is intact.  Included paranasal sinuses and mastoid air cells are well aerated.  IMPRESSION: No acute intracranial abnormality.   Electronically Signed   By: Jeb Levering M.D.   On: 02/11/2015 05:01   Dg Chest Portable 1 View  02/11/2015   CLINICAL DATA:  Hypoxia, dyspnea. Nausea and vomiting. Recent shoulder surgery.  EXAM: PORTABLE CHEST - 1 VIEW  COMPARISON:  None.  FINDINGS: The heart is normal in size, there is atherosclerosis of the thoracic aorta. Bibasilar atelectasis, right greater than left. Mild vascular congestion without edema. No confluent airspace disease, pleural effusion, or pneumothorax. No acute osseous abnormalities are seen.  IMPRESSION: Mild vascular congestion with bibasilar atelectasis.   Electronically Signed   By: Jeb Levering M.D.   On: 02/11/2015 04:45     Assessment and Recommendation  69 y.o. female with known sleep apnea diabetes mixed hyperlipidemia essential hypertension with the significant progression of chronic obstructive pulmonary disease and hypoxia and congestive heart failure consistent with diastolic dysfunction heart failure status post right shoulder surgery gout current evidence of myocardial infarction 1. Continue aggressive treatment of above obstructive pulmonary disease with hypoxia and is of little antibodies as well as inhalers 2. Continue intravenous Lasix for pulmonary edema and lower extremity edema with congestive heart failure 3. Echocardiogram pending 4. Begin ambulation and further treatment option changes as necessary  Signed, Serafina Royals M.D. FACC

## 2015-02-12 NOTE — Progress Notes (Signed)
Patient ID: Joaquin Bend, female   DOB: 29-Apr-1946, 69 y.o.   MRN: [id number] [facility] PROGRESS NOTE  PCP: Adrian Prows, MD  HPI/Subjective: Patient and daughter in the room. Patient having trouble staying awake. Some shortness of breath even before the operation. Shortness of breath with activity before the operation. No cough. Since the surgery not eating or drinking very well. Was unable to urinate.  Objective: Filed Vitals:   02/12/15 1115  BP: 160/72  Pulse: 78  Temp: 98.2 F (36.8 C)  Resp: 18    Intake/Output Summary (Last 24 hours) at 02/12/15 1335 Last data filed at 02/12/15 0900  Gross per 24 hour  Intake 1040.67 ml  Output   1900 ml  Net -859.33 ml   Filed Weights   02/11/15 1458 02/11/15 1502 02/12/15 0537  Weight: 104.69 kg (230 lb 12.8 oz) 104.69 kg (230 lb 12.8 oz) 103.54 kg (228 lb 4.2 oz)    ROS: Review of Systems  Constitutional: Negative for fever and chills.  Eyes: Negative for blurred vision.  Respiratory: Positive for cough and shortness of breath. Negative for wheezing.   Cardiovascular: Negative for chest pain.  Gastrointestinal: Positive for nausea and diarrhea. Negative for vomiting, abdominal pain and constipation.  Genitourinary: Negative for dysuria.  Musculoskeletal: Negative for joint pain.  Neurological: Negative for dizziness and headaches.   Exam: Physical Exam  Constitutional: She appears lethargic.  HENT:  Nose: No mucosal edema.  Mouth/Throat: No oropharyngeal exudate or posterior oropharyngeal edema.  Eyes: Conjunctivae, EOM and lids are normal. Pupils are equal, round, and reactive to light.  Neck: No JVD present. Carotid bruit is not present. No edema present. No thyroid mass and no thyromegaly present.  Cardiovascular: S1 normal and S2 normal.  Exam reveals no gallop.   No murmur heard. Pulses:      Dorsalis pedis pulses are 2+ on the right side, and 2+ on the left side.  Respiratory: No  respiratory distress. She has no wheezes. She has no rhonchi. She has no rales.  GI: Soft. Bowel sounds are normal. There is no tenderness.  Musculoskeletal:       Right ankle: She exhibits swelling.       Left ankle: She exhibits swelling.  Lymphadenopathy:    She has no cervical adenopathy.  Neurological: She appears lethargic. No cranial nerve deficit.  Patient is able to answer questions appropriately. He falls asleep very easily. Daughter states that this has happened since being here.  Skin: Skin is warm. No rash noted. Nails show no clubbing.  Right shoulder covered with large bandage.  Psychiatric: She has a normal mood and affect.    Data Reviewed: Basic Metabolic Panel:  Recent Labs Lab 02/11/15 0345 02/11/15 0430 02/11/15 1443 02/12/15 0433  NA 133* 135 137 142  K 6.7* 5.4* 4.3 3.5  CL 100* 103 103 104  CO2 '22 22 28 30  '$ GLUCOSE 306* 324* 224* 216*  BUN 50* 48* 31* 17  CREATININE 2.35* 2.29* 1.28*  1.37* 0.76  CALCIUM 8.3* 8.1* 7.9* 8.2*   Liver Function Tests:  Recent Labs Lab 02/11/15 0345 02/11/15 0430 02/12/15 0433  AST 1200* 1229* 636*  ALT 841* 833* 856*  ALKPHOS 113 107 103  BILITOT 1.6* 0.6 0.8  PROT 7.2 6.8 6.3*  ALBUMIN 3.8 3.7 3.4*    Recent Labs Lab 02/11/15 1136  AMMONIA 45*   CBC:  Recent Labs Lab 02/11/15 0345 02/11/15 1443  WBC 11.8* 6.8  NEUTROABS 9.5*  --  HGB 12.5 11.2*  HCT 38.4 34.3*  MCV 91.1 91.0  PLT 170 148*   CBG:  Recent Labs Lab 02/11/15 1725 02/11/15 1826 02/11/15 2107 02/12/15 0736 02/12/15 1206  GLUCAP 248* 254* 223* 222* 216*    No results found for this or any previous visit (from the past 240 hour(s)).   Studies: Ct Abdomen Pelvis Wo Contrast  02/11/2015   CLINICAL DATA:  Initial encounter for nausea vomiting that started last night. History of right-sided breast cancer.  EXAM: CT ABDOMEN AND PELVIS WITHOUT CONTRAST  TECHNIQUE: Multidetector CT imaging of the abdomen and pelvis was performed  following the standard protocol without IV contrast.  COMPARISON:  05/18/2009.  FINDINGS: Lower chest: Compressive atelectasis noted in both lower lobes with tiny bilateral pleural effusions.  Hepatobiliary: The liver shows diffusely decreased attenuation suggesting steatosis. Patient is status post cholecystectomy. No intrahepatic or extrahepatic biliary dilation.  Pancreas: No focal mass lesion. No dilatation of the main duct. No intraparenchymal cyst. No peripancreatic edema.  Spleen: No splenomegaly. No focal mass lesion.  Adrenals/Urinary Tract: No adrenal nodule or mass. No evidence for urinary stones. No hydroureteronephrosis. No gross renal mass lesion on this study without intravenous contrast material. Foley catheter decompresses the urinary bladder and gas in the bladder lumen is compatible with the instrumentation.  Stomach/Bowel: Stomach is nondistended. No gastric wall thickening. No evidence of outlet obstruction. Duodenum is normally positioned as is the ligament of Treitz. No small bowel wall thickening. No small bowel dilatation. Terminal ileum normal. The appendix is not visualized, but there is no edema or inflammation in the region of the cecum. Diverticular changes are noted in the left colon without evidence of diverticulitis.  Vascular/Lymphatic: There is abdominal aortic atherosclerosis without aneurysm. Borderline gastrohepatic ligament lymphadenopathy is stable but decreased in the interval since the prior study, consistent with benign process. There is no retroperitoneal lymphadenopathy.  Reproductive: Uterus is surgically absent.  No adnexal mass.  Other: No intraperitoneal free fluid.  Musculoskeletal: Bone windows reveal no worrisome lytic or sclerotic osseous lesions.  IMPRESSION: No acute findings in the abdomen or pelvis. Specifically, no findings to explain the patient's history of nausea and vomiting.  Colonic diverticulosis without diverticulitis.  Steatosis.   Electronically  Signed   By: Misty Stanley M.D.   On: 02/11/2015 08:57   Ct Head Wo Contrast  02/11/2015   CLINICAL DATA:  Altered mental status, nausea and vomiting. Shoulder surgery two days prior.  EXAM: CT HEAD WITHOUT CONTRAST  TECHNIQUE: Contiguous axial images were obtained from the base of the skull through the vertex without intravenous contrast.  COMPARISON:  None.  FINDINGS: No intracranial hemorrhage, mass effect, or midline shift. No hydrocephalus. The basilar cisterns are patent. No evidence of territorial infarct. No intracranial fluid collection. Calvarium is intact. Included paranasal sinuses and mastoid air cells are well aerated.  IMPRESSION: No acute intracranial abnormality.   Electronically Signed   By: Jeb Levering M.D.   On: 02/11/2015 05:01   Dg Chest Portable 1 View  02/11/2015   CLINICAL DATA:  Hypoxia, dyspnea. Nausea and vomiting. Recent shoulder surgery.  EXAM: PORTABLE CHEST - 1 VIEW  COMPARISON:  None.  FINDINGS: The heart is normal in size, there is atherosclerosis of the thoracic aorta. Bibasilar atelectasis, right greater than left. Mild vascular congestion without edema. No confluent airspace disease, pleural effusion, or pneumothorax. No acute osseous abnormalities are seen.  IMPRESSION: Mild vascular congestion with bibasilar atelectasis.   Electronically Signed   By: Threasa Beards  Ehinger M.D.   On: 02/11/2015 04:45    Scheduled Meds: . docusate sodium  100 mg Oral BID  . furosemide  20 mg Intravenous Once  . heparin  5,000 Units Subcutaneous 3 times per day  . insulin aspart  0-15 Units Subcutaneous TID WC  . insulin aspart  0-5 Units Subcutaneous QHS  . insulin aspart  3 Units Subcutaneous TID WC  . lactulose  30 g Oral Daily  . loratadine  10 mg Oral BH-q7a  . naLOXone (NARCAN)  injection  0.4 mg Intravenous Once  . pantoprazole  40 mg Oral Daily  . PARoxetine  40 mg Oral BH-q7a  . rifaximin  550 mg Oral BID   Continuous Infusions:   Assessment/Plan:  1. Acute  encephalopathy: This could be secondary to pain medications Percocet and tramadol sticking around longer in the system with the acute renal failure. I will give 1 dose of Narcan 0.4 mg IV 1. I will stop Percocet and tramadol at this time. Her TENS unit seems to be holding the pain okay. This could also be secondary to hepatic encephalopathy with her ammonia level up at 45. Xifaxan and I will order lactulose daily. Case discussed with nursing staff. 2. Acute renal failure: This had improved with IV fluid hydration. I stopped the fluid hydration this morning since there was a question of congestive heart failure. 3. Fluid overload, acute diastolic congestive heart failure: EF 60%, I will give 1 dose of Lasix 20 mg IV 1 and monitor closely. 4. Elevation of liver function tests. The liver function tests are trending better. Likely this is medication related. Stop Tylenol. Stop Percocet. Check a Tylenol level. Check hepatitis A, B, and C profiles. 5. Hyperkalemia improved with Kayexalate. 6. Diabetes mellitus type 2 patient does on insulin aspart. 7. Gastroesophageal reflux disease without esophagitis continue Protonix 8. Depression on Paxil 9. Sleep apnea on CPAP at night.  Code Status:     Code Status Orders        Start     Ordered   02/11/15 1418  Full code   Continuous     02/11/15 1417    Advance Directive Documentation        Most Recent Value   Type of Advance Directive  Healthcare Power of Attorney, Living will [Son]   Pre-existing out of facility DNR order (yellow form or pink MOST form)     "MOST" Form in Place?       Family Communication: Daughter at bedside Disposition Plan: Undetermined yet  Time spent: 40 minutes  Loletha Grayer  [facility]

## 2015-02-13 LAB — COMPREHENSIVE METABOLIC PANEL
ALK PHOS: 105 U/L (ref 38–126)
ALT: 677 U/L — ABNORMAL HIGH (ref 14–54)
ANION GAP: 6 (ref 5–15)
AST: 259 U/L — ABNORMAL HIGH (ref 15–41)
Albumin: 3.4 g/dL — ABNORMAL LOW (ref 3.5–5.0)
BILIRUBIN TOTAL: 0.6 mg/dL (ref 0.3–1.2)
BUN: 11 mg/dL (ref 6–20)
CO2: 33 mmol/L — ABNORMAL HIGH (ref 22–32)
Calcium: 8.3 mg/dL — ABNORMAL LOW (ref 8.9–10.3)
Chloride: 101 mmol/L (ref 101–111)
Creatinine, Ser: 0.69 mg/dL (ref 0.44–1.00)
GFR calc non Af Amer: >60 mL/min (ref >60)
GLUCOSE: 208 mg/dL — AB (ref 65–99)
POTASSIUM: 3.2 mmol/L — AB (ref 3.5–5.1)
Sodium: 140 mmol/L (ref 135–145)
TOTAL PROTEIN: 6.4 g/dL — AB (ref 6.5–8.1)

## 2015-02-13 LAB — GLUCOSE, CAPILLARY
GLUCOSE-CAPILLARY: 175 mg/dL — AB (ref 65–99)
GLUCOSE-CAPILLARY: 262 mg/dL — AB (ref 65–99)
Glucose-Capillary: 221 mg/dL — ABNORMAL HIGH (ref 65–99)
Glucose-Capillary: 191 mg/dL — ABNORMAL HIGH (ref 65–99)

## 2015-02-13 LAB — PROTIME-INR
INR: 1.07
Prothrombin Time: 14.1 seconds (ref 11.4–15.0)

## 2015-02-13 LAB — AMMONIA: AMMONIA: 43 umol/L — AB (ref 9–35)

## 2015-02-13 MED ORDER — POTASSIUM CHLORIDE CRYS ER 20 MEQ PO TBCR
20.0000 meq | EXTENDED_RELEASE_TABLET | Freq: Every day | ORAL | Status: DC
  Administered 2015-02-13 – 2015-02-14 (×2): 20 meq via ORAL
  Filled 2015-02-13 (×2): qty 1

## 2015-02-13 MED ORDER — FUROSEMIDE 10 MG/ML IJ SOLN
40.0000 mg | Freq: Every day | INTRAMUSCULAR | Status: DC
  Administered 2015-02-13 – 2015-02-14 (×2): 40 mg via INTRAVENOUS
  Filled 2015-02-13 (×2): qty 4

## 2015-02-13 MED ORDER — OXYCODONE HCL 5 MG PO TABS: 5.0000 mg | ORAL_TABLET | Freq: Three times a day (TID) | ORAL | Status: DC | PRN

## 2015-02-13 MED ORDER — IBUPROFEN 400 MG PO TABS
400.0000 mg | ORAL_TABLET | Freq: Three times a day (TID) | ORAL | Status: DC | PRN
  Administered 2015-02-15: 400 mg via ORAL
  Filled 2015-02-13: qty 1

## 2015-02-13 MED ORDER — MORPHINE SULFATE 2 MG/ML IJ SOLN
1.0000 mg | Freq: Once | INTRAMUSCULAR | Status: AC
  Administered 2015-02-13: 1 mg via INTRAVENOUS
  Filled 2015-02-13: qty 1

## 2015-02-13 NOTE — Consult Note (Signed)
Subjective: Patient seen for abnormal liver enzymes. She states she is feeling better today than yesterday. He has no nausea or vomiting. There is no abdominal pain. Her appetite however is not good.   Objective: Vital signs in last 24 hours: Temp:  [98.7 F (37.1 C)-99.7 F (37.6 C)] 98.7 F (37.1 C) (06/05 1110) Pulse Rate:  [74-78] 74 (06/05 1110) Resp:  [17-20] 17 (06/05 1110) BP: (154-176)/(68-73) 154/68 mmHg (06/05 1110) SpO2:  [97 %-99 %] 97 % (06/05 1110) Weight:  [101.243 kg (223 lb 3.2 oz)] 101.243 kg (223 lb 3.2 oz) (06/05 0435) Blood pressure 154/68, pulse 74, temperature 98.7 F (37.1 C), temperature source Oral, resp. rate 17, height '5\' 4"'$  (1.626 m), weight 101.243 kg (223 lb 3.2 oz), SpO2 97 %.   Intake/Output from previous day: 06/04 0701 - 06/05 0700 In: 1280.7 [P.O.:480; I.V.:800.7] Out: 1925 [Urine:1925]  Intake/Output this shift: Total I/O In: 1085 [P.O.:1085] Out: 1300 [Urine:1300]   General appearance:  69 year old female no acute distress Resp:  Clear to auscultation Cardio:  Regular rate and rhythm GI:  Soft nontender nondistended bowel sounds positive normoactive Extremities:  Right arm in a immobilization sling   Lab Results: Results for orders placed or performed during the hospital encounter of 02/11/15 (from the past 24 hour(s))  Glucose, capillary     Status: Abnormal   Collection Time: 02/12/15  7:57 PM  Result Value Ref Range   Glucose-Capillary 257 (H) 65 - 99 mg/dL  Ammonia     Status: Abnormal   Collection Time: 02/13/15  4:39 AM  Result Value Ref Range   Ammonia 43 (H) 9 - 35 umol/L  Comprehensive metabolic panel     Status: Abnormal   Collection Time: 02/13/15  4:39 AM  Result Value Ref Range   Sodium 140 135 - 145 mmol/L   Potassium 3.2 (L) 3.5 - 5.1 mmol/L   Chloride 101 101 - 111 mmol/L   CO2 33 (H) 22 - 32 mmol/L   Glucose, Bld 208 (H) 65 - 99 mg/dL   BUN 11 6 - 20 mg/dL   Creatinine, Ser 0.69 0.44 - 1.00 mg/dL   Calcium  8.3 (L) 8.9 - 10.3 mg/dL   Total Protein 6.4 (L) 6.5 - 8.1 g/dL   Albumin 3.4 (L) 3.5 - 5.0 g/dL   AST 259 (H) 15 - 41 U/L   ALT 677 (H) 14 - 54 U/L   Alkaline Phosphatase 105 38 - 126 U/L   Total Bilirubin 0.6 0.3 - 1.2 mg/dL   GFR calc non Af Amer >60 >60 mL/min   GFR calc Af Amer >60 >60 mL/min   Anion gap 6 5 - 15  Protime-INR     Status: None   Collection Time: 02/13/15  4:39 AM  Result Value Ref Range   Prothrombin Time 14.1 11.4 - 15.0 seconds   INR 1.07   Glucose, capillary     Status: Abnormal   Collection Time: 02/13/15  7:42 AM  Result Value Ref Range   Glucose-Capillary 221 (H) 65 - 99 mg/dL  Glucose, capillary     Status: Abnormal   Collection Time: 02/13/15 11:19 AM  Result Value Ref Range   Glucose-Capillary 175 (H) 65 - 99 mg/dL      Recent Labs  02/11/15 0345 02/11/15 1443  WBC 11.8* 6.8  HGB 12.5 11.2*  HCT 38.4 34.3*  PLT 170 148*   BMET  Recent Labs  02/11/15 1443 02/12/15 0433 02/13/15 0439  NA 137 142 140  K 4.3 3.5 3.2*  CL 103 104 101  CO2 28 30 33*  GLUCOSE 224* 216* 208*  BUN 31* 17 11  CREATININE 1.28*  1.37* 0.76 0.69  CALCIUM 7.9* 8.2* 8.3*   LFT  Recent Labs  02/12/15 0433 02/13/15 0439  PROT 6.3* 6.4*  ALBUMIN 3.4* 3.4*  AST 636* 259*  ALT 856* 677*  ALKPHOS 103 105  BILITOT 0.8 0.6  BILIDIR 0.2  --    PT/INR  Recent Labs  02/12/15 0433 02/13/15 0439  LABPROT 15.4* 14.1  INR 1.20 1.07   Hepatitis Panel No results for input(s): HEPBSAG, HCVAB, HEPAIGM, HEPBIGM in the last 72 hours. C-Diff No results for input(s): CDIFFTOX in the last 72 hours. No results for input(s): CDIFFPCR in the last 72 hours.   Studies/Results: No results found.  Scheduled Inpatient Medications:   . docusate sodium  100 mg Oral BID  . furosemide  40 mg Intravenous Daily  . heparin  5,000 Units Subcutaneous 3 times per day  . insulin aspart  0-15 Units Subcutaneous TID WC  . insulin aspart  0-5 Units Subcutaneous QHS  .  insulin aspart  3 Units Subcutaneous TID WC  . lactulose  30 g Oral Daily  . loratadine  10 mg Oral BH-q7a  . pantoprazole  40 mg Oral Daily  . PARoxetine  40 mg Oral BH-q7a  . potassium chloride  20 mEq Oral Daily  . rifaximin  550 mg Oral BID    Continuous Inpatient Infusions:     PRN Inpatient Medications:  albuterol, ALPRAZolam, budesonide-formoterol, haloperidol lactate, ibuprofen, ondansetron (ZOFRAN) IV, oxyCODONE  Miscellaneous:   Assessment:  1. Abnormal liver enzymes. Continues to improve. 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  Plan:  1. Awaiting labs as noted  Lollie Sails MD 02/13/2015, 4:15 PM

## 2015-02-13 NOTE — Progress Notes (Signed)
Patient ID: Tammy Hatfield, female   DOB: 1946-04-12, 69 y.o.   MRN: [id number] [facility] PROGRESS NOTE  PCP: Adrian Prows, MD  HPI/Subjective:   Objective: Filed Vitals:   02/13/15 1110  BP: 154/68  Pulse: 74  Temp: 98.7 F (37.1 C)  Resp: 17    Intake/Output Summary (Last 24 hours) at 02/13/15 1115 Last data filed at 02/13/15 0830  Gross per 24 hour  Intake    480 ml  Output   1725 ml  Net  -1245 ml   Filed Weights   02/11/15 1502 02/12/15 0537 02/13/15 0435  Weight: 104.69 kg (230 lb 12.8 oz) 103.54 kg (228 lb 4.2 oz) 101.243 kg (223 lb 3.2 oz)    ROS: Review of Systems  Constitutional: Negative for fever and chills.  Eyes: Negative for blurred vision.  Respiratory: Positive for shortness of breath. Negative for cough.   Cardiovascular: Positive for leg swelling. Negative for chest pain and palpitations.  Gastrointestinal: Positive for diarrhea. Negative for nausea, vomiting and abdominal pain.  Genitourinary: Positive for frequency.  Musculoskeletal: Positive for joint pain.  Skin: Negative for rash.  Neurological: Positive for weakness. Negative for dizziness and headaches.  Endo/Heme/Allergies: Does not bruise/bleed easily.  Psychiatric/Behavioral: Negative for depression.   Exam: Physical Exam  Constitutional: She is oriented to person, place, and time.  HENT:  Nose: No mucosal edema.  Mouth/Throat: No oropharyngeal exudate or posterior oropharyngeal edema.  Eyes: Conjunctivae, EOM and lids are normal. Pupils are equal, round, and reactive to light.  Neck: No JVD present. Carotid bruit is not present. No edema present. No thyroid mass and no thyromegaly present.  Cardiovascular: S1 normal and S2 normal.  Exam reveals no gallop.   No murmur heard. Pulses:      Dorsalis pedis pulses are 1+ on the right side, and 1+ on the left side.  Respiratory: No respiratory distress. She has decreased breath sounds in the right lower field and  the left lower field. She has no wheezes. She has no rhonchi. She has rales in the right lower field and the left lower field.  GI: Soft. Bowel sounds are normal. There is no tenderness.  Musculoskeletal:       Right ankle: She exhibits swelling.       Left ankle: She exhibits swelling.  Lymphadenopathy:    She has no cervical adenopathy.  Neurological: She is alert and oriented to person, place, and time. No cranial nerve deficit.  Skin: Skin is warm. No rash noted. Nails show no clubbing.  Right shoulder covered with large bandage.  Psychiatric: She has a normal mood and affect.    Data Reviewed: Basic Metabolic Panel:  Recent Labs Lab 02/11/15 0345 02/11/15 0430 02/11/15 1443 02/12/15 0433 02/13/15 0439  NA 133* 135 137 142 140  K 6.7* 5.4* 4.3 3.5 3.2*  CL 100* 103 103 104 101  CO2 '22 22 28 30 '$ 33*  GLUCOSE 306* 324* 224* 216* 208*  BUN 50* 48* 31* 17 11  CREATININE 2.35* 2.29* 1.28*  1.37* 0.76 0.69  CALCIUM 8.3* 8.1* 7.9* 8.2* 8.3*   Liver Function Tests:  Recent Labs Lab 02/11/15 0345 02/11/15 0430 02/12/15 0433 02/13/15 0439  AST 1200* 1229* 636* 259*  ALT 841* 833* 856* 677*  ALKPHOS 113 107 103 105  BILITOT 1.6* 0.6 0.8 0.6  PROT 7.2 6.8 6.3* 6.4*  ALBUMIN 3.8 3.7 3.4* 3.4*    Recent Labs Lab 02/11/15 1136 02/13/15 0439  AMMONIA 45* 43*  CBC:  Recent Labs Lab 02/11/15 0345 02/11/15 1443  WBC 11.8* 6.8  NEUTROABS 9.5*  --   HGB 12.5 11.2*  HCT 38.4 34.3*  MCV 91.1 91.0  PLT 170 148*   CBG:  Recent Labs Lab 02/12/15 0736 02/12/15 1206 02/12/15 1615 02/12/15 1957 02/13/15 0742  GLUCAP 222* 216* 206* 257* 221*   Scheduled Meds: . docusate sodium  100 mg Oral BID  . furosemide  40 mg Intravenous Daily  . heparin  5,000 Units Subcutaneous 3 times per day  . insulin aspart  0-15 Units Subcutaneous TID WC  . insulin aspart  0-5 Units Subcutaneous QHS  . insulin aspart  3 Units Subcutaneous TID WC  . lactulose  30 g Oral Daily  .  loratadine  10 mg Oral BH-q7a  . pantoprazole  40 mg Oral Daily  . PARoxetine  40 mg Oral BH-q7a  . rifaximin  550 mg Oral BID   Continuous Infusions:   Assessment/Plan:  1. Acute encephalopathy: Much improved. Likely this was secondary to pain medications Percocet and tramadol sticking around longer in the system with the acute renal failure.  Her TENS unit seems to be holding the pain okay. Can reorder small dose of oxycodone at this time when necessary. This could also be secondary to hepatic encephalopathy with her ammonia level up at 45. Xifaxan and I will order lactulose daily. Hopefully we will be able to stop the lactulose and Xifaxan once liver function tests improve. 2. Acute renal failure: Resolved. 3. Fluid overload, acute diastolic congestive heart failure: EF 60%, I will give Lasix 40 mg IV daily. 4. Elevation of liver function tests. The liver function tests are trending better. Likely this is medication related. Stop Tylenol. Stop Percocet. Check hepatitis A, B, and C profiles. Daily comprehensive metabolic panel checks. 5. Hyperkalemia improved with Kayexalate. 6. Diabetes mellitus type 2: insulin aspart. 7. Gastroesophageal reflux disease without esophagitis continue Protonix 8. Depression on Paxil 9. Sleep apnea on CPAP at night. 10. We'll get physical therapy consultation for weakness. 60. Consult Dr. Jefm Bryant orthopedics on Monday morning for follow-up on his patient. 12. Headache: Can give Motrin since kidney function improved.  Code Status:     Code Status Orders        Start     Ordered   02/11/15 1418  Full code   Continuous     02/11/15 1417    Advance Directive Documentation        Most Recent Value   Type of Advance Directive  Healthcare Power of Attorney, Living will [Son]   Pre-existing out of facility DNR order (yellow form or pink MOST form)     "MOST" Form in Place?       Family Communication: Daughter at bedside Disposition Plan: Undetermined  yet  Time spent: 25 minutes  Loletha Grayer  [facility]

## 2015-02-13 NOTE — Progress Notes (Signed)
[facility] Encounter Note  Patient: Tammy Hatfield / Admit Date: 02/11/2015 / Date of Encounter: 02/13/2015, 7:45 AM   Subjective: Shortness of breath and shoulder pain which appears degrees improving at this time  Review of Systems: Positive for: Shortness of breath right shoulder pain Negative for: Vision change, hearing change, syncope, dizziness, nausea, vomiting,diarrhea, bloody stool, stomach pain, cough, congestion, diaphoresis, urinary frequency, urinary pain,skin lesions, skin rashes Others previously listed  Objective: Telemetry: Normal sinus rhythm Physical Exam: Blood pressure 176/73, pulse 77, temperature 99.4 F (37.4 C), temperature source Oral, resp. rate 20, height '5\' 4"'$  (1.626 m), weight 223 lb 3.2 oz (101.243 kg), SpO2 97 %. Body mass index is 38.29 kg/(m^2). General: Well developed, well nourished, in no acute distress. Head: Normocephalic, atraumatic, sclera non-icteric, no xanthomas, nares are without discharge. Neck: No apparent masses Lungs: Normal respirations with diffuse wheezes, no rhonchi, no rales , positive for crackles   Heart: Regular rate and rhythm, normal S1 S2, no murmur, no rub, no gallop, PMI is normal size and placement, carotid upstroke normal without bruit, jugular venous pressure normal Abdomen: Soft, non-tender, non-distended with normoactive bowel sounds. No hepatosplenomegaly. Abdominal aorta is normal size without bruit Extremities: Trace edema, no clubbing, no cyanosis, no ulcers,  Peripheral: 2+ radial, 2+ femoral, 2+ dorsal pedal pulses Neuro: Alert and oriented. Moves all extremities spontaneously. Psych:  Responds to questions appropriately with a normal affect.   Intake/Output Summary (Last 24 hours) at 02/13/15 0745 Last data filed at 02/12/15 2100  Gross per 24 hour  Intake 1280.67 ml  Output   1925 ml  Net -644.33 ml    Inpatient Medications:  . docusate sodium  100 mg Oral BID  . heparin  5,000  Units Subcutaneous 3 times per day  . insulin aspart  0-15 Units Subcutaneous TID WC  . insulin aspart  0-5 Units Subcutaneous QHS  . insulin aspart  3 Units Subcutaneous TID WC  . lactulose  30 g Oral Daily  . loratadine  10 mg Oral BH-q7a  .  morphine injection  1 mg Intravenous Once  . pantoprazole  40 mg Oral Daily  . PARoxetine  40 mg Oral BH-q7a  . rifaximin  550 mg Oral BID   Infusions:    Labs:  Recent Labs  02/12/15 0433 02/13/15 0439  NA 142 140  K 3.5 3.2*  CL 104 101  CO2 30 33*  GLUCOSE 216* 208*  BUN 17 11  CREATININE 0.76 0.69  CALCIUM 8.2* 8.3*    Recent Labs  02/12/15 0433 02/13/15 0439  AST 636* 259*  ALT 856* 677*  ALKPHOS 103 105  BILITOT 0.8 0.6  PROT 6.3* 6.4*  ALBUMIN 3.4* 3.4*    Recent Labs  02/11/15 0345 02/11/15 1443  WBC 11.8* 6.8  NEUTROABS 9.5*  --   HGB 12.5 11.2*  HCT 38.4 34.3*  MCV 91.1 91.0  PLT 170 148*    Recent Labs  02/11/15 0430  CKTOTAL 169   Invalid input(s): POCBNP  Recent Labs  02/11/15 1443  HGBA1C 7.4*     Weights: Filed Weights   02/11/15 1502 02/12/15 0537 02/13/15 0435  Weight: 230 lb 12.8 oz (104.69 kg) 228 lb 4.2 oz (103.54 kg) 223 lb 3.2 oz (101.243 kg)     Radiology/Studies:  Ct Abdomen Pelvis Wo Contrast  02/11/2015   CLINICAL DATA:  Initial encounter for nausea vomiting that started last night. History of right-sided breast cancer.  EXAM: CT ABDOMEN AND PELVIS  WITHOUT CONTRAST  TECHNIQUE: Multidetector CT imaging of the abdomen and pelvis was performed following the standard protocol without IV contrast.  COMPARISON:  05/18/2009.  FINDINGS: Lower chest: Compressive atelectasis noted in both lower lobes with tiny bilateral pleural effusions.  Hepatobiliary: The liver shows diffusely decreased attenuation suggesting steatosis. Patient is status post cholecystectomy. No intrahepatic or extrahepatic biliary dilation.  Pancreas: No focal mass lesion. No dilatation of the main duct. No  intraparenchymal cyst. No peripancreatic edema.  Spleen: No splenomegaly. No focal mass lesion.  Adrenals/Urinary Tract: No adrenal nodule or mass. No evidence for urinary stones. No hydroureteronephrosis. No gross renal mass lesion on this study without intravenous contrast material. Foley catheter decompresses the urinary bladder and gas in the bladder lumen is compatible with the instrumentation.  Stomach/Bowel: Stomach is nondistended. No gastric wall thickening. No evidence of outlet obstruction. Duodenum is normally positioned as is the ligament of Treitz. No small bowel wall thickening. No small bowel dilatation. Terminal ileum normal. The appendix is not visualized, but there is no edema or inflammation in the region of the cecum. Diverticular changes are noted in the left colon without evidence of diverticulitis.  Vascular/Lymphatic: There is abdominal aortic atherosclerosis without aneurysm. Borderline gastrohepatic ligament lymphadenopathy is stable but decreased in the interval since the prior study, consistent with benign process. There is no retroperitoneal lymphadenopathy.  Reproductive: Uterus is surgically absent.  No adnexal mass.  Other: No intraperitoneal free fluid.  Musculoskeletal: Bone windows reveal no worrisome lytic or sclerotic osseous lesions.  IMPRESSION: No acute findings in the abdomen or pelvis. Specifically, no findings to explain the patient's history of nausea and vomiting.  Colonic diverticulosis without diverticulitis.  Steatosis.   Electronically Signed   By: Misty Stanley M.D.   On: 02/11/2015 08:57   Ct Head Wo Contrast  02/11/2015   CLINICAL DATA:  Altered mental status, nausea and vomiting. Shoulder surgery two days prior.  EXAM: CT HEAD WITHOUT CONTRAST  TECHNIQUE: Contiguous axial images were obtained from the base of the skull through the vertex without intravenous contrast.  COMPARISON:  None.  FINDINGS: No intracranial hemorrhage, mass effect, or midline shift. No  hydrocephalus. The basilar cisterns are patent. No evidence of territorial infarct. No intracranial fluid collection. Calvarium is intact. Included paranasal sinuses and mastoid air cells are well aerated.  IMPRESSION: No acute intracranial abnormality.   Electronically Signed   By: Jeb Levering M.D.   On: 02/11/2015 05:01   Dg Chest Portable 1 View  02/11/2015   CLINICAL DATA:  Hypoxia, dyspnea. Nausea and vomiting. Recent shoulder surgery.  EXAM: PORTABLE CHEST - 1 VIEW  COMPARISON:  None.  FINDINGS: The heart is normal in size, there is atherosclerosis of the thoracic aorta. Bibasilar atelectasis, right greater than left. Mild vascular congestion without edema. No confluent airspace disease, pleural effusion, or pneumothorax. No acute osseous abnormalities are seen.  IMPRESSION: Mild vascular congestion with bibasilar atelectasis.   Electronically Signed   By: Jeb Levering M.D.   On: 02/11/2015 04:45     Assessment and Recommendation  69 y.o. female with known sleep apnea diabetes mixed hyperlipidemia essential hypertension with the significant progression of chronic obstructive pulmonary disease and hypoxia and congestive heart failure consistent with diastolic dysfunction heart failure status post right shoulder surgery gout current evidence of myocardial infarction 1. Continue aggressive treatment of above obstructive pulmonary disease with hypoxia and continued treatment with anti-biotics 2. Continue intravenous Lasix for pulmonary edema and lower extremity edema with congestive heart failure  3. No further cardiac diagnostics necessary at this time 4. Begin ambulation and further treatment option changes as necessary 5. Further adjustments of medication management based on improvements over the next 24 hours  Signed, Serafina Royals M.D. FACC

## 2015-02-13 NOTE — Evaluation (Signed)
Physical Therapy Evaluation Patient Details Name: Tammy Hatfield MRN: [id number] DOB: 1946-07-07 Today's Date: 02/13/2015   History of Present Illness  Pt had R shoulder sx 1 week ago, now here with renal failure, and some AMS  Clinical Impression  Pt does well with PT session but generally is weak and is not at all at her baseline.  Her O2 remains in the high 90s on 3 liters, but quickly drops to the 80s on room air.  She is able to maintain balance and take a few steps but is very fatigued.    Follow Up Recommendations SNF (per pt progress)    Equipment Recommendations       Recommendations for Other Services       Precautions / Restrictions Precautions Precautions: Fall Restrictions Weight Bearing Restrictions: No      Mobility  Bed Mobility Overal bed mobility: Modified Independent             General bed mobility comments: Pt slow and heavily reliant on the bed rails, but able to rise with only CGA  Transfers Overall transfer level: Modified independent Equipment used: 1 person hand held assist             General transfer comment: Pt is somewhat fearful and hesitant with transfers, but does well  Ambulation/Gait Ambulation/Gait assistance: Min assist Ambulation Distance (Feet): 5 Feet Assistive device: 1 person hand held assist       General Gait Details: Pt is able to take a few small, hesitant steps but fatigues and lacks confidence   Stairs            Wheelchair Mobility    Modified Rankin (Stroke Patients Only)       Balance                                             Pertinent Vitals/Pain Pain Assessment:  (general post-surgical R shoulder pain)    Home Living Family/patient expects to be discharged to:: Skilled nursing facility Living Arrangements:  (pt cares for her husband)               Additional Comments: Pt has a ramp at her home, uses o2 at night    Prior Function Level of Independence:  Independent         Comments: Pt able to run errands, grocery shop, etc     Hand Dominance        Extremity/Trunk Assessment   Upper Extremity Assessment:  (R UE in immobilizer, L WFL)           Lower Extremity Assessment: Generalized weakness         Communication   Communication: No difficulties  Cognition Arousal/Alertness: Awake/alert Behavior During Therapy: WFL for tasks assessed/performed Overall Cognitive Status: Within Functional Limits for tasks assessed                      General Comments      Exercises        Assessment/Plan    PT Assessment Patient needs continued PT services  PT Diagnosis Difficulty walking;Generalized weakness   PT Problem List Decreased strength;Decreased activity tolerance;Decreased balance;Decreased safety awareness  PT Treatment Interventions Gait training;Functional mobility training;Therapeutic activities;Balance training;Therapeutic exercise;Neuromuscular re-education   PT Goals (Current goals can be found in the Care Plan section) Acute Rehab PT Goals Patient  Stated Goal: "I would rather go home if I can." PT Goal Formulation: With patient Time For Goal Achievement: 02/27/15 Potential to Achieve Goals: Good    Frequency Min 2X/week   Barriers to discharge        Co-evaluation               End of Session Equipment Utilized During Treatment: Gait belt Activity Tolerance: Patient limited by fatigue Patient left: with bed alarm set           Time: 2550-0164 PT Time Calculation (min) (ACUTE ONLY): 24 min   Charges:   PT Evaluation $Initial PT Evaluation Tier I: 1 Procedure     PT G Codes:       Tammy Hatfield, PT, DPT [phone]  Kreg Shropshire 02/13/2015, 5:05 PM

## 2015-02-14 LAB — GLUCOSE, CAPILLARY
GLUCOSE-CAPILLARY: 211 mg/dL — AB (ref 65–99)
GLUCOSE-CAPILLARY: 308 mg/dL — AB (ref 65–99)
Glucose-Capillary: 216 mg/dL — ABNORMAL HIGH (ref 65–99)
Glucose-Capillary: 261 mg/dL — ABNORMAL HIGH (ref 65–99)

## 2015-02-14 LAB — COMPREHENSIVE METABOLIC PANEL
ALBUMIN: 3.2 g/dL — AB (ref 3.5–5.0)
ALK PHOS: 106 U/L (ref 38–126)
ALT: 466 U/L — ABNORMAL HIGH (ref 14–54)
ANION GAP: 9 (ref 5–15)
AST: 93 U/L — ABNORMAL HIGH (ref 15–41)
BILIRUBIN TOTAL: 0.6 mg/dL (ref 0.3–1.2)
BUN: 12 mg/dL (ref 6–20)
CALCIUM: 8.4 mg/dL — AB (ref 8.9–10.3)
CO2: 35 mmol/L — AB (ref 22–32)
CREATININE: 0.71 mg/dL (ref 0.44–1.00)
Chloride: 94 mmol/L — ABNORMAL LOW (ref 101–111)
GFR calc non Af Amer: >60 mL/min (ref >60)
Glucose, Bld: 207 mg/dL — ABNORMAL HIGH (ref 65–99)
Potassium: 3.1 mmol/L — ABNORMAL LOW (ref 3.5–5.1)
SODIUM: 138 mmol/L (ref 135–145)
Total Protein: 6.3 g/dL — ABNORMAL LOW (ref 6.5–8.1)

## 2015-02-14 MED ORDER — FUROSEMIDE 40 MG PO TABS
40.0000 mg | ORAL_TABLET | Freq: Every day | ORAL | Status: DC
  Administered 2015-02-15: 40 mg via ORAL
  Filled 2015-02-14: qty 1

## 2015-02-14 MED ORDER — INSULIN GLARGINE 100 UNIT/ML ~~LOC~~ SOLN
10.0000 [IU] | Freq: Every day | SUBCUTANEOUS | Status: DC
  Administered 2015-02-14: 10 [IU] via SUBCUTANEOUS
  Filled 2015-02-14 (×2): qty 0.1

## 2015-02-14 MED ORDER — POTASSIUM CHLORIDE CRYS ER 20 MEQ PO TBCR
20.0000 meq | EXTENDED_RELEASE_TABLET | Freq: Two times a day (BID) | ORAL | Status: DC
  Administered 2015-02-14 – 2015-02-15 (×2): 20 meq via ORAL
  Filled 2015-02-14 (×2): qty 1

## 2015-02-14 NOTE — Care Management (Signed)
Patient currently anticipates skilled nursing placement post discharge.  She had right should rotator cuff repair the day prior to admission.  Denies prior history of chf.  02 is not chronic.

## 2015-02-14 NOTE — Progress Notes (Signed)
Patient ID: Tammy Hatfield, female   DOB: 1945-12-02, 69 y.o.   MRN: [id number]  [facility] PROGRESS NOTE  PCP: Adrian Prows, MD  Subjective: Pt. Sitting up in bed w/ daughter at bedside.  Awake & alert.  No other complaints presently.   Objective: Filed Vitals:   02/14/15 0935  BP: 142/56  Pulse: 85  Temp: 98.7 F (37.1 C)  Resp: 18    Intake/Output Summary (Last 24 hours) at 02/14/15 1025 Last data filed at 02/14/15 0900  Gross per 24 hour  Intake   1205 ml  Output   5801 ml  Net  -4596 ml   Filed Weights   02/12/15 0537 02/13/15 0435 02/14/15 0400  Weight: 103.54 kg (228 lb 4.2 oz) 101.243 kg (223 lb 3.2 oz) 99.383 kg (219 lb 1.6 oz)    ROS: Review of Systems  Constitutional: Negative for fever and chills.  HENT: Negative for congestion and tinnitus.   Eyes: Negative for blurred vision and double vision.  Respiratory: Negative for cough, shortness of breath and wheezing.   Cardiovascular: Negative for chest pain, orthopnea and PND.  Gastrointestinal: Negative for nausea, vomiting, abdominal pain and diarrhea.  Genitourinary: Negative for dysuria and hematuria.  Skin: Negative for rash.  Neurological: Positive for weakness (genearlized). Negative for dizziness, sensory change and focal weakness.  All other systems reviewed and are negative.  Exam: Physical Exam  Constitutional: She is oriented to person, place, and time.  HENT:  Nose: No mucosal edema.  Mouth/Throat: No oropharyngeal exudate or posterior oropharyngeal edema.  Eyes: Conjunctivae, EOM and lids are normal. Pupils are equal, round, and reactive to light.  Neck: No JVD present. Carotid bruit is not present. No edema present. No thyroid mass and no thyromegaly present.  Cardiovascular: Normal rate, regular rhythm, S1 normal and S2 normal.  Exam reveals no gallop.   No murmur heard. Pulses:      Dorsalis pedis pulses are 1+ on the right side, and 1+ on the left side.   Respiratory: No respiratory distress. She has decreased breath sounds in the right lower field and the left lower field. She has no wheezes. She has no rhonchi. She has no rales.  GI: Soft. Bowel sounds are normal. There is no tenderness.  Musculoskeletal:       Right ankle: She exhibits swelling.       Left ankle: She exhibits swelling.  Lymphadenopathy:    She has no cervical adenopathy.  Neurological: She is alert and oriented to person, place, and time. No cranial nerve deficit.  Globally weak  Skin: Skin is warm. No rash noted. Nails show no clubbing.  Right shoulder covered with large bandage.  Psychiatric: She has a normal mood and affect.    Data Reviewed: Basic Metabolic Panel:  Recent Labs Lab 02/11/15 0430 02/11/15 1443 02/12/15 0433 02/13/15 0439 02/14/15 0344  NA 135 137 142 140 138  K 5.4* 4.3 3.5 3.2* 3.1*  CL 103 103 104 101 94*  CO2 '22 28 30 '$ 33* 35*  GLUCOSE 324* 224* 216* 208* 207*  BUN 48* 31* '17 11 12  '$ CREATININE 2.29* 1.28*  1.37* 0.76 0.69 0.71  CALCIUM 8.1* 7.9* 8.2* 8.3* 8.4*   Liver Function Tests:  Recent Labs Lab 02/11/15 0345 02/11/15 0430 02/12/15 0433 02/13/15 0439 02/14/15 0344  AST 1200* 1229* 636* 259* 93*  ALT 841* 833* 856* 677* 466*  ALKPHOS 113 107 103 105 106  BILITOT 1.6* 0.6 0.8 0.6 0.6  PROT 7.2  6.8 6.3* 6.4* 6.3*  ALBUMIN 3.8 3.7 3.4* 3.4* 3.2*    Recent Labs Lab 02/11/15 1136 02/13/15 0439  AMMONIA 45* 43*   CBC:  Recent Labs Lab 02/11/15 0345 02/11/15 1443  WBC 11.8* 6.8  NEUTROABS 9.5*  --   HGB 12.5 11.2*  HCT 38.4 34.3*  MCV 91.1 91.0  PLT 170 148*   CBG:  Recent Labs Lab 02/13/15 0742 02/13/15 1119 02/13/15 1622 02/13/15 2100 02/14/15 0720  GLUCAP 221* 175* 191* 262* 216*   Scheduled Meds: . docusate sodium  100 mg Oral BID  . furosemide  40 mg Oral Daily  . heparin  5,000 Units Subcutaneous 3 times per day  . insulin aspart  0-15 Units Subcutaneous TID WC  . insulin aspart  0-5  Units Subcutaneous QHS  . insulin aspart  3 Units Subcutaneous TID WC  . lactulose  30 g Oral Daily  . loratadine  10 mg Oral BH-q7a  . pantoprazole  40 mg Oral Daily  . PARoxetine  40 mg Oral BH-q7a  . potassium chloride  20 mEq Oral BID   Continuous Infusions:   Assessment/Plan:  1. Acute encephalopathy: Much improved. Likely this was secondary to pain medications Percocet and tramadol sticking around longer in the system with the acute renal failure.  Her TENS unit seems to be holding the pain okay - mental status back to baseline now as her renal function has improved.   - ?? Hepatic encephalopathy as ammonia elevated - will cont. Lactulose but d/c Xifaxan for now.   2. Acute renal failure: Resolved with IV fluids and back to baseline.  3. Fluid overload, acute diastolic congestive heart failure: EF 60% -Improved with diuresis and will switch Lasix to by mouth today.  4. Elevation of liver function tests - this is likely related to her pain meds. She currently is off Tylenol/Percocet. Await hepatitis A, B, and C profiles. - Follow LFTs  4. Hyperkalemia - resolved his renal function is improved.  5. Diabetes mellitus type 2: Blood sugar stable, continue sliding scale insulin.  6. Gastroesophageal reflux disease without esophagitis continue Protonix  7. Depression/anxiety - continue Paxil, Xanax  8. Sleep apnea -  cont. CPAP at night.  9. Hypokalemia-this is likely related to diuresis.  We'll increase potassium supplementation. - Follow level in a.m.  Has been seen by physical therapy and recommended short-term rehabilitation. Case management has been notified and will start bed search.  Code Status:     Code Status Orders        Start     Ordered   02/11/15 1418  Full code   Continuous     02/11/15 1417    Advance Directive Documentation        Most Recent Value   Type of Advance Directive  Healthcare Power of Attorney, Living will [Son]   Pre-existing out of  facility DNR order (yellow form or pink MOST form)     "MOST" Form in Place?       Family Communication: Daughter at bedside Disposition Plan: Likely d/c to SNF tomorrow if bed available.   Time spent: 30 minutes  Henreitta Leber  [facility]

## 2015-02-14 NOTE — Progress Notes (Signed)
Inpatient Diabetes Program Recommendations  AACE/[name]: New Consensus Statement on Inpatient Glycemic Control (2013)  Target Ranges:  Prepandial:   less than 140 mg/dL      Peak postprandial:   less than 180 mg/dL (1-2 hours)      Critically ill patients:  140 - 180 mg/dL    Results for Tammy Hatfield, Tammy Hatfield (MRN [id number]) as of 02/14/2015 10:33  Ref. Range 02/13/2015 07:42 02/13/2015 11:19 02/13/2015 16:22 02/13/2015 21:00  Glucose-Capillary Latest Ref Range: 65-99 mg/dL 221 (H) 175 (H) 191 (H) 262 (H)    Results for Tammy Hatfield, Tammy Hatfield (MRN [id number]) as of 02/14/2015 10:33  Ref. Range 02/14/2015 07:20  Glucose-Capillary Latest Ref Range: 65-99 mg/dL 216 (H)    Results for Tammy Hatfield, Tammy Hatfield (MRN [id number]) as of 02/14/2015 10:33  Ref. Range 02/11/2015 14:43  Hemoglobin A1C Latest Ref Range: 4.0-6.0 % 7.4 (H)     Admit with: Acute CHF and Acute Kidney Failure s/p Shoulder Surgery  History: DM, HTN, COPD  Home DM Meds: Glipizide 10 mg daily       70/30 insulin- 60 units AM/ 40 units PM  Current DM Orders: Novolog Moderate SSI (0-15 units) tid ac + HS             Novolog 3 units tid with meals    **Patient with very Poor PO Intake yesterday- bites to 30% of her meal tray.  **Ate 75% of breakfast this AM fortunately.  **Has been having elevated fasting glucose levels for 2 days now.   MD- Please consider adding low dose basal insulin to patient's in-hospital insulin regimen.  Restarting a portion of her 70/30 insulin not the best option at this point since her PO intake is still poor.  Recommend Levemir 10 units QHS (0.1 unit/kg dosing)    Will follow Wyn Quaker RN, MSN, CDE Diabetes Coordinator Inpatient Diabetes Program Team Pager: [phone] (8a-5p)

## 2015-02-14 NOTE — Consult Note (Signed)
Subjective: Patient seen for abnormal liver enzymes. He is are continuing to trend downward. She denies any problems of nausea or abdominal pain. Her shortness of breath is improving.  Objective: Vital signs in last 24 hours: Temp:  [98.1 F (36.7 C)-99.6 F (37.6 C)] 98.1 F (36.7 C) (06/06 1154) Pulse Rate:  [74-98] 98 (06/06 1154) Resp:  [18-24] 18 (06/06 1154) BP: (142-157)/(52-62) 150/57 mmHg (06/06 1154) SpO2:  [96 %-98 %] 97 % (06/06 1154) Weight:  [99.383 kg (219 lb 1.6 oz)] 99.383 kg (219 lb 1.6 oz) (06/06 0400) Blood pressure 150/57, pulse 98, temperature 98.1 F (36.7 C), temperature source Oral, resp. rate 18, height '5\' 4"'$  (1.626 m), weight 99.383 kg (219 lb 1.6 oz), SpO2 97 %.   Intake/Output from previous day: 06/05 0701 - 06/06 0700 In: 1205 [P.O.:1205] Out: 5801 [Urine:5800; Stool:1]  Intake/Output this shift: Total I/O In: 480 [P.O.:480] Out: 300 [Urine:300]   General appearance:  No acute distress Resp:  Clear to auscultation Cardio:  Regular rate and rhythm GI:  Protuberant/obese nontender nondistended bowel sounds are positive normoactive Extremities:  Trace edema bilaterally lower extremities   Lab Results: Results for orders placed or performed during the hospital encounter of 02/11/15 (from the past 24 hour(s))  Glucose, capillary     Status: Abnormal   Collection Time: 02/13/15  9:00 PM  Result Value Ref Range   Glucose-Capillary 262 (H) 65 - 99 mg/dL  Comprehensive metabolic panel     Status: Abnormal   Collection Time: 02/14/15  3:44 AM  Result Value Ref Range   Sodium 138 135 - 145 mmol/L   Potassium 3.1 (L) 3.5 - 5.1 mmol/L   Chloride 94 (L) 101 - 111 mmol/L   CO2 35 (H) 22 - 32 mmol/L   Glucose, Bld 207 (H) 65 - 99 mg/dL   BUN 12 6 - 20 mg/dL   Creatinine, Ser 0.71 0.44 - 1.00 mg/dL   Calcium 8.4 (L) 8.9 - 10.3 mg/dL   Total Protein 6.3 (L) 6.5 - 8.1 g/dL   Albumin 3.2 (L) 3.5 - 5.0 g/dL   AST 93 (H) 15 - 41 U/L   ALT 466 (H) 14 - 54  U/L   Alkaline Phosphatase 106 38 - 126 U/L   Total Bilirubin 0.6 0.3 - 1.2 mg/dL   GFR calc non Af Amer >60 >60 mL/min   GFR calc Af Amer >60 >60 mL/min   Anion gap 9 5 - 15  Glucose, capillary     Status: Abnormal   Collection Time: 02/14/15  7:20 AM  Result Value Ref Range   Glucose-Capillary 216 (H) 65 - 99 mg/dL   Comment 1 Notify RN   Glucose, capillary     Status: Abnormal   Collection Time: 02/14/15 11:03 AM  Result Value Ref Range   Glucose-Capillary 261 (H) 65 - 99 mg/dL   Comment 1 Notify RN   Glucose, capillary     Status: Abnormal   Collection Time: 02/14/15  4:25 PM  Result Value Ref Range   Glucose-Capillary 211 (H) 65 - 99 mg/dL   Comment 1 Notify RN      No results for input(s): WBC, HGB, HCT, PLT in the last 72 hours. BMET  Recent Labs  02/12/15 0433 02/13/15 0439 02/14/15 0344  NA 142 140 138  K 3.5 3.2* 3.1*  CL 104 101 94*  CO2 30 33* 35*  GLUCOSE 216* 208* 207*  BUN '17 11 12  '$ CREATININE 0.76 0.69 0.71  CALCIUM  8.2* 8.3* 8.4*   LFT  Recent Labs  02/12/15 0433  02/14/15 0344  PROT 6.3*  < > 6.3*  ALBUMIN 3.4*  < > 3.2*  AST 636*  < > 93*  ALT 856*  < > 466*  ALKPHOS 103  < > 106  BILITOT 0.8  < > 0.6  BILIDIR 0.2  --   --   < > = values in this interval not displayed. PT/INR  Recent Labs  02/12/15 0433 02/13/15 0439  LABPROT 15.4* 14.1  INR 1.20 1.07   Hepatitis Panel No results for input(s): HEPBSAG, HCVAB, HEPAIGM, HEPBIGM in the last 72 hours. C-Diff No results for input(s): CDIFFTOX in the last 72 hours. No results for input(s): CDIFFPCR in the last 72 hours.   Studies/Results: No results found.  Scheduled Inpatient Medications:   . docusate sodium  100 mg Oral BID  . furosemide  40 mg Oral Daily  . heparin  5,000 Units Subcutaneous 3 times per day  . insulin aspart  0-15 Units Subcutaneous TID WC  . insulin aspart  0-5 Units Subcutaneous QHS  . insulin aspart  3 Units Subcutaneous TID WC  . insulin glargine  10  Units Subcutaneous QHS  . lactulose  30 g Oral Daily  . loratadine  10 mg Oral BH-q7a  . pantoprazole  40 mg Oral Daily  . PARoxetine  40 mg Oral BH-q7a  . potassium chloride  20 mEq Oral BID    Continuous Inpatient Infusions:     PRN Inpatient Medications:  albuterol, ALPRAZolam, budesonide-formoterol, haloperidol lactate, ibuprofen, ondansetron (ZOFRAN) IV, oxyCODONE  Miscellaneous:   Assessment:  1. Abnormal liver enzymes likely multifactorial in setting of a history of heart failure medications. Multiple Risk factors for chronic viral disorder of the liver however serology still pending. Labs continue to improve 2. Hyperammonemia related to #1  Plan:  1. No new GI recommendations awaiting results of serologies for hepatitis. We'll follow at a distance  Lollie Sails MD 02/14/2015, 5:57 PM

## 2015-02-14 NOTE — Clinical Social Work Placement (Signed)
   CLINICAL SOCIAL WORK PLACEMENT  NOTE  Date:  02/14/2015  Patient Details  Name: Tammy Hatfield MRN: [id number] Date of Birth: Jan 21, 1946  Clinical Social Work is seeking post-discharge placement for this patient at the [place] level of care (*CSW will initial, date and re-position this form in  chart as items are completed):  Yes   Patient/family provided with [facility] Work Department's list of facilities offering this level of care within the geographic area requested by the patient (or if unable, by the patient's family).  Yes   Patient/family informed of their freedom to choose among providers that offer the needed level of care, that participate in Medicare, Medicaid or managed care program needed by the patient, have an available bed and are willing to accept the patient.  Yes   Patient/family informed of [facility]'s ownership interest in [facility] and [facility], as well as of the fact that they are under no obligation to receive care at these facilities.  PASRR submitted to EDS on       PASRR number received on       Existing PASRR number confirmed on 02/14/15     FL2 transmitted to all facilities in geographic area requested by pt/family on 02/14/15     FL2 transmitted to all facilities within larger geographic area on 02/14/15     Patient informed that his/her managed care company has contracts with or will negotiate with certain facilities, including the following:   (SNF packet was given to pt with this information on it.)         Patient/family informed of bed offers received.  Patient chooses bed at       Physician recommends and patient chooses bed at      Patient to be transferred to   on  .  Patient to be transferred to facility by       Patient family notified on   of transfer.  Name of family member notified:        PHYSICIAN       Additional Comment:     _______________________________________________ Mathews Argyle, LCSW 02/14/2015, 12:36 PM

## 2015-02-14 NOTE — Progress Notes (Signed)
[facility] Encounter Note  Patient: Tammy Hatfield / Admit Date: 02/11/2015 / Date of Encounter: 02/14/2015, 8:16 AM   Subjective: Shortness of breath and shoulder pain which appears to be improving over the last several days. Patient is breathing much better and resting more comfortably  Review of Systems: Positive for: Shortness of breath right shoulder pain Negative for: Vision change, hearing change, syncope, dizziness, nausea, vomiting,diarrhea, bloody stool, stomach pain, cough, congestion, diaphoresis, urinary frequency, urinary pain,skin lesions, skin rashes Others previously listed  Objective: Telemetry: Normal sinus rhythm Physical Exam: Blood pressure 153/52, pulse 74, temperature 99.6 F (37.6 C), temperature source Oral, resp. rate 22, height '5\' 4"'$  (1.626 m), weight 219 lb 1.6 oz (99.383 kg), SpO2 98 %. Body mass index is 37.59 kg/(m^2). General: Well developed, well nourished, in no acute distress. Head: Normocephalic, atraumatic, sclera non-icteric, no xanthomas, nares are without discharge. Neck: No apparent masses Lungs: Normal respirations with diffuse wheezes, few rhonchi, no rales , positive for crackles but improved  Heart: Regular rate and rhythm, normal S1 S2, no murmur, no rub, no gallop, PMI is normal size and placement, carotid upstroke normal without bruit, jugular venous pressure normal Abdomen: Soft, non-tender, non-distended with normoactive bowel sounds. No hepatosplenomegaly. Abdominal aorta is normal size without bruit Extremities: Trace edema, no clubbing, no cyanosis, no ulcers,  Peripheral: 2+ radial, 2+ femoral, 2+ dorsal pedal pulses Neuro: Alert and oriented. Moves all extremities spontaneously. Psych:  Responds to questions appropriately with a normal affect.   Intake/Output Summary (Last 24 hours) at 02/14/15 0816 Last data filed at 02/14/15 0730  Gross per 24 hour  Intake   1205 ml  Output   5801 ml  Net  -4596 ml     Inpatient Medications:  . docusate sodium  100 mg Oral BID  . furosemide  40 mg Intravenous Daily  . heparin  5,000 Units Subcutaneous 3 times per day  . insulin aspart  0-15 Units Subcutaneous TID WC  . insulin aspart  0-5 Units Subcutaneous QHS  . insulin aspart  3 Units Subcutaneous TID WC  . lactulose  30 g Oral Daily  . loratadine  10 mg Oral BH-q7a  . pantoprazole  40 mg Oral Daily  . PARoxetine  40 mg Oral BH-q7a  . potassium chloride  20 mEq Oral Daily  . rifaximin  550 mg Oral BID   Infusions:    Labs:  Recent Labs  02/13/15 0439 02/14/15 0344  NA 140 138  K 3.2* 3.1*  CL 101 94*  CO2 33* 35*  GLUCOSE 208* 207*  BUN 11 12  CREATININE 0.69 0.71  CALCIUM 8.3* 8.4*    Recent Labs  02/13/15 0439 02/14/15 0344  AST 259* 93*  ALT 677* 466*  ALKPHOS 105 106  BILITOT 0.6 0.6  PROT 6.4* 6.3*  ALBUMIN 3.4* 3.2*    Recent Labs  02/11/15 1443  WBC 6.8  HGB 11.2*  HCT 34.3*  MCV 91.0  PLT 148*   No results for input(s): CKTOTAL, CKMB, TROPONINI in the last 72 hours. Invalid input(s): POCBNP  Recent Labs  02/11/15 1443  HGBA1C 7.4*     Weights: Filed Weights   02/12/15 0537 02/13/15 0435 02/14/15 0400  Weight: 228 lb 4.2 oz (103.54 kg) 223 lb 3.2 oz (101.243 kg) 219 lb 1.6 oz (99.383 kg)     Radiology/Studies:  Ct Abdomen Pelvis Wo Contrast  02/11/2015   CLINICAL DATA:  Initial encounter for nausea vomiting that started last night.  History of right-sided breast cancer.  EXAM: CT ABDOMEN AND PELVIS WITHOUT CONTRAST  TECHNIQUE: Multidetector CT imaging of the abdomen and pelvis was performed following the standard protocol without IV contrast.  COMPARISON:  05/18/2009.  FINDINGS: Lower chest: Compressive atelectasis noted in both lower lobes with tiny bilateral pleural effusions.  Hepatobiliary: The liver shows diffusely decreased attenuation suggesting steatosis. Patient is status post cholecystectomy. No intrahepatic or extrahepatic biliary  dilation.  Pancreas: No focal mass lesion. No dilatation of the main duct. No intraparenchymal cyst. No peripancreatic edema.  Spleen: No splenomegaly. No focal mass lesion.  Adrenals/Urinary Tract: No adrenal nodule or mass. No evidence for urinary stones. No hydroureteronephrosis. No gross renal mass lesion on this study without intravenous contrast material. Foley catheter decompresses the urinary bladder and gas in the bladder lumen is compatible with the instrumentation.  Stomach/Bowel: Stomach is nondistended. No gastric wall thickening. No evidence of outlet obstruction. Duodenum is normally positioned as is the ligament of Treitz. No small bowel wall thickening. No small bowel dilatation. Terminal ileum normal. The appendix is not visualized, but there is no edema or inflammation in the region of the cecum. Diverticular changes are noted in the left colon without evidence of diverticulitis.  Vascular/Lymphatic: There is abdominal aortic atherosclerosis without aneurysm. Borderline gastrohepatic ligament lymphadenopathy is stable but decreased in the interval since the prior study, consistent with benign process. There is no retroperitoneal lymphadenopathy.  Reproductive: Uterus is surgically absent.  No adnexal mass.  Other: No intraperitoneal free fluid.  Musculoskeletal: Bone windows reveal no worrisome lytic or sclerotic osseous lesions.  IMPRESSION: No acute findings in the abdomen or pelvis. Specifically, no findings to explain the patient's history of nausea and vomiting.  Colonic diverticulosis without diverticulitis.  Steatosis.   Electronically Signed   By: Misty Stanley M.D.   On: 02/11/2015 08:57   Ct Head Wo Contrast  02/11/2015   CLINICAL DATA:  Altered mental status, nausea and vomiting. Shoulder surgery two days prior.  EXAM: CT HEAD WITHOUT CONTRAST  TECHNIQUE: Contiguous axial images were obtained from the base of the skull through the vertex without intravenous contrast.  COMPARISON:   None.  FINDINGS: No intracranial hemorrhage, mass effect, or midline shift. No hydrocephalus. The basilar cisterns are patent. No evidence of territorial infarct. No intracranial fluid collection. Calvarium is intact. Included paranasal sinuses and mastoid air cells are well aerated.  IMPRESSION: No acute intracranial abnormality.   Electronically Signed   By: Jeb Levering M.D.   On: 02/11/2015 05:01   Dg Chest Portable 1 View  02/11/2015   CLINICAL DATA:  Hypoxia, dyspnea. Nausea and vomiting. Recent shoulder surgery.  EXAM: PORTABLE CHEST - 1 VIEW  COMPARISON:  None.  FINDINGS: The heart is normal in size, there is atherosclerosis of the thoracic aorta. Bibasilar atelectasis, right greater than left. Mild vascular congestion without edema. No confluent airspace disease, pleural effusion, or pneumothorax. No acute osseous abnormalities are seen.  IMPRESSION: Mild vascular congestion with bibasilar atelectasis.   Electronically Signed   By: Jeb Levering M.D.   On: 02/11/2015 04:45     Assessment and Recommendation  69 y.o. female with known sleep apnea diabetes mixed hyperlipidemia essential hypertension with the significant progression of chronic obstructive pulmonary disease and hypoxia and congestive heart failure consistent with diastolic dysfunction heart failure status post right shoulder surgery pain without current evidence of myocardial infarction 1. Continue aggressive treatment of above obstructive pulmonary disease with hypoxia and continued treatment with anti-biotics which appears to  be improving over the last several days 2. Continue intravenous Lasix for pulmonary edema and lower extremity edema with congestive heart failure and change to oral Lasix today 3. No further cardiac diagnostics necessary at this time 4. Begin ambulation and further treatment option changes as necessary 5. Further adjustments of medication management based on improvements over the next 24 hours with  possible discharge to home with follow-up adjustments thereafter  Signed, Serafina Royals M.D. FACC

## 2015-02-14 NOTE — Clinical Social Work Note (Signed)
Clinical Social Work Assessment  Patient Details  Name: Tammy Hatfield MRN: [id number] Date of Birth: May 04, 1946  Date of referral:  02/14/15               Reason for consult:  Facility Placement                Permission sought to share information with:  Facility Sport and exercise psychologist, Family Supports Permission granted to share information::  Yes, Verbal Permission Granted  Name::        Agency::     Relationship::     Contact Information:     Housing/Transportation Living arrangements for the past 2 months:  Single Family Home Source of Information:  Patient, Adult Children Patient Interpreter Needed:  None Criminal Activity/Legal Involvement Pertinent to Current Situation/Hospitalization:  No - Comment as needed Significant Relationships:  Adult Children, Spouse Lives with:  Adult Children, Spouse Do you feel safe going back to the place where you live?  No Need for family participation in patient care:  No (Coment)  Care giving concerns:  Current concerns are placement options.  CSW will notify pt once offers have been received.   Social Worker assessment / plan:  CSW spoke to pt.  She was laying in bed alert and Ox3 .  She was in agreement with SNF placement.  Current preference is [facility].  CSW will f/u once offers have been received.  Employment status:  Retired Forensic scientist:  Medicare PT Recommendations:    Information / Referral to community resources:     Patient/Family's Response to care:  Pt's daughter and pt were in agreement with SNF placement.  Patient/Family's Understanding of and Emotional Response to Diagnosis, Current Treatment, and Prognosis:  Pt's daughter and pt were in agreement with SNF placement.  Both thanked CSW for speaking with them about placement.    Emotional Assessment Appearance:  Appears stated age Attitude/Demeanor/Rapport:   (calm and understanding.) Affect (typically observed):    Orientation:  Oriented to Self,  Oriented to Place, Oriented to  Time, Oriented to Situation Alcohol / Substance use:  Never Used Psych involvement (Current and /or in the community):  No (Comment)  Discharge Needs  Concerns to be addressed:  Care Coordination Readmission within the last 30 days:  No Current discharge risk:    Barriers to Discharge:      Mathews Argyle, LCSW 02/14/2015, 12:33 PM

## 2015-02-15 ENCOUNTER — Encounter
Admission: RE | Admit: 2015-02-15 | Discharge: 2015-02-15 | Disposition: A | Discharge status: Home / Self Care | Payer: Medicare Other | Source: Ambulatory Visit | Attending: Internal Medicine | Admitting: Internal Medicine

## 2015-02-15 LAB — COMPREHENSIVE METABOLIC PANEL
ALT: 310 U/L — AB (ref 14–54)
ANION GAP: 8 (ref 5–15)
AST: 47 U/L — ABNORMAL HIGH (ref 15–41)
Albumin: 3.4 g/dL — ABNORMAL LOW (ref 3.5–5.0)
Alkaline Phosphatase: 109 U/L (ref 38–126)
BUN: 13 mg/dL (ref 6–20)
CO2: 36 mmol/L — ABNORMAL HIGH (ref 22–32)
Calcium: 8.9 mg/dL (ref 8.9–10.3)
Chloride: 96 mmol/L — ABNORMAL LOW (ref 101–111)
Creatinine, Ser: 0.9 mg/dL (ref 0.44–1.00)
GFR calc Af Amer: >60 mL/min (ref >60)
Glucose, Bld: 229 mg/dL — ABNORMAL HIGH (ref 65–99)
Potassium: 3.4 mmol/L — ABNORMAL LOW (ref 3.5–5.1)
SODIUM: 140 mmol/L (ref 135–145)
TOTAL PROTEIN: 6.7 g/dL (ref 6.5–8.1)
Total Bilirubin: 0.7 mg/dL (ref 0.3–1.2)

## 2015-02-15 LAB — HEPATITIS B SURFACE ANTIGEN: HEP B S AG: NEGATIVE

## 2015-02-15 LAB — GLUCOSE, CAPILLARY
GLUCOSE-CAPILLARY: 294 mg/dL — AB (ref 65–99)
Glucose-Capillary: 265 mg/dL — ABNORMAL HIGH (ref 65–99)

## 2015-02-15 LAB — HCV COMMENT:

## 2015-02-15 LAB — HEPATITIS A ANTIBODY, IGM: Hep A IgM: NEGATIVE

## 2015-02-15 LAB — HEPATITIS B SURFACE ANTIBODY, QUANTITATIVE: HEPATITIS B-POST: 5.8 m[IU]/mL — AB

## 2015-02-15 LAB — HEPATITIS B CORE ANTIBODY, TOTAL: HEP B C TOTAL AB: NEGATIVE

## 2015-02-15 MED ORDER — FUROSEMIDE 40 MG PO TABS: 40.0000 mg | ORAL_TABLET | Freq: Every day | ORAL | Status: DC

## 2015-02-15 MED ORDER — OXYCODONE HCL 5 MG PO TABS: 5.0000 mg | ORAL_TABLET | Freq: Three times a day (TID) | ORAL | Status: DC | PRN

## 2015-02-15 NOTE — Progress Notes (Signed)
Physical Therapy Treatment Patient Details Name: BENNETTE HASTY MRN: [id number] DOB: 1945/10/15 Today's Date: 2015-02-28    History of Present Illness Pt had R shoulder sx 1 week ago, now here with renal failure, and some AMS    PT Comments    Pt preparing for d/c this afternoon, amb trial on RA approx 20 feet able to maintain SpO2 at 90% but very fatigued and recommend to continue use of O2 supplement. Trial use of Hemiwalker with good results after instruction. Would continue to benefit from skilled PT to improve balance,gait, and endurance for safety.   Follow Up Recommendations  SNF     Equipment Recommendations       Recommendations for Other Services       Precautions / Restrictions Precautions Precautions: Fall Restrictions Weight Bearing Restrictions: No    Mobility  Bed Mobility Overal bed mobility: Modified Independent             General bed mobility comments: Pt slow and heavily reliant on the bed rails, but able to rise with only CGA  Transfers Overall transfer level: Modified independent Equipment used: Hemi-walker             General transfer comment: instruction on sit to stand transfer with hemi walker  Ambulation/Gait Ambulation/Gait assistance: Min assist Ambulation Distance (Feet): 20 Feet Assistive device: Hemi-walker       General Gait Details: pt amb approx 20 feet with hiemiwalker, small steps recip pattern fatigued quickly   Stairs            Wheelchair Mobility    Modified Rankin (Stroke Patients Only)       Balance                                    Cognition Arousal/Alertness: Awake/alert Behavior During Therapy: WFL for tasks assessed/performed Overall Cognitive Status: Within Functional Limits for tasks assessed                      Exercises      General Comments        Pertinent Vitals/Pain      Home Living                      Prior Function             PT Goals (current goals can now be found in the care plan section) Acute Rehab PT Goals PT Goal Formulation: With patient Time For Goal Achievement: 02/27/15 Potential to Achieve Goals: Good    Frequency  Min 2X/week    PT Plan      Co-evaluation             End of Session Equipment Utilized During Treatment: Gait belt Activity Tolerance: Patient limited by fatigue Patient left: in bed;with bed alarm set;with call bell/phone within reach     Time: 1119-1145 PT Time Calculation (min) (ACUTE ONLY): 26 min  Charges:  $Gait Training: 8-22 mins $Therapeutic Activity: 8-22 mins                    G Codes:      Loryn Haacke 28-Feb-2015, 11:49 AM  Yumi Insalaco, PTA

## 2015-02-15 NOTE — Progress Notes (Signed)
[facility] Encounter Note  Patient: Tammy Hatfield / Admit Date: 02/11/2015 / Date of Encounter: 02/15/2015, 8:15 AM   Subjective: Shortness of breath and shoulder pain which appears to be improving as well as improved encephalopathy secondary to medication management  Review of Systems: Positive for: Shortness of breath   Negative for: Vision change, hearing change, syncope, dizziness, nausea, vomiting,diarrhea, bloody stool, stomach pain, cough, congestion, diaphoresis, urinary frequency, urinary pain,skin lesions, skin rashes Others previously listed  Objective: Telemetry: Normal sinus rhythm Physical Exam: Blood pressure 141/59, pulse 83, temperature 98.1 F (36.7 C), temperature source Oral, resp. rate 20, height '5\' 4"'$  (1.626 m), weight 214 lb 8 oz (97.297 kg), SpO2 94 %. Body mass index is 36.8 kg/(m^2). General: Well developed, well nourished, in no acute distress. Head: Normocephalic, atraumatic, sclera non-icteric, no xanthomas, nares are without discharge. Neck: No apparent masses Lungs: Normal respirations with few wheezes, few rhonchi, no rales , some basilar crackles  Heart: Regular rate and rhythm, normal S1 S2, no murmur, no rub, no gallop, PMI is normal size and placement, carotid upstroke normal without bruit, jugular venous pressure normal Abdomen: Soft, non-tender, non-distended with normoactive bowel sounds. No hepatosplenomegaly. Abdominal aorta is normal size without bruit Extremities: Trace edema, no clubbing, no cyanosis, no ulcers,  Peripheral: 2+ radial, 2+ femoral, 2+ dorsal pedal pulses Neuro: Alert and oriented. Moves all extremities spontaneously. Psych:  Responds to questions appropriately with a normal affect.   Intake/Output Summary (Last 24 hours) at 02/15/15 0815 Last data filed at 02/14/15 1735  Gross per 24 hour  Intake    720 ml  Output    300 ml  Net    420 ml    Inpatient Medications:  . docusate sodium  100 mg  Oral BID  . furosemide  40 mg Oral Daily  . heparin  5,000 Units Subcutaneous 3 times per day  . insulin aspart  0-15 Units Subcutaneous TID WC  . insulin aspart  0-5 Units Subcutaneous QHS  . insulin aspart  3 Units Subcutaneous TID WC  . insulin glargine  10 Units Subcutaneous QHS  . lactulose  30 g Oral Daily  . loratadine  10 mg Oral BH-q7a  . pantoprazole  40 mg Oral Daily  . PARoxetine  40 mg Oral BH-q7a  . potassium chloride  20 mEq Oral BID   Infusions:    Labs:  Recent Labs  02/14/15 0344 02/15/15 0349  NA 138 140  K 3.1* 3.4*  CL 94* 96*  CO2 35* 36*  GLUCOSE 207* 229*  BUN 12 13  CREATININE 0.71 0.90  CALCIUM 8.4* 8.9    Recent Labs  02/14/15 0344 02/15/15 0349  AST 93* 47*  ALT 466* 310*  ALKPHOS 106 109  BILITOT 0.6 0.7  PROT 6.3* 6.7  ALBUMIN 3.2* 3.4*   No results for input(s): WBC, NEUTROABS, HGB, HCT, MCV, PLT in the last 72 hours. No results for input(s): CKTOTAL, CKMB, TROPONINI in the last 72 hours. Invalid input(s): POCBNP No results for input(s): HGBA1C in the last 72 hours.   Weights: Filed Weights   02/13/15 0435 02/14/15 0400 02/15/15 0539  Weight: 223 lb 3.2 oz (101.243 kg) 219 lb 1.6 oz (99.383 kg) 214 lb 8 oz (97.297 kg)     Radiology/Studies:  Ct Abdomen Pelvis Wo Contrast  02/11/2015   CLINICAL DATA:  Initial encounter for nausea vomiting that started last night. History of right-sided breast cancer.  EXAM: CT ABDOMEN AND PELVIS WITHOUT  CONTRAST  TECHNIQUE: Multidetector CT imaging of the abdomen and pelvis was performed following the standard protocol without IV contrast.  COMPARISON:  05/18/2009.  FINDINGS: Lower chest: Compressive atelectasis noted in both lower lobes with tiny bilateral pleural effusions.  Hepatobiliary: The liver shows diffusely decreased attenuation suggesting steatosis. Patient is status post cholecystectomy. No intrahepatic or extrahepatic biliary dilation.  Pancreas: No focal mass lesion. No dilatation of  the main duct. No intraparenchymal cyst. No peripancreatic edema.  Spleen: No splenomegaly. No focal mass lesion.  Adrenals/Urinary Tract: No adrenal nodule or mass. No evidence for urinary stones. No hydroureteronephrosis. No gross renal mass lesion on this study without intravenous contrast material. Foley catheter decompresses the urinary bladder and gas in the bladder lumen is compatible with the instrumentation.  Stomach/Bowel: Stomach is nondistended. No gastric wall thickening. No evidence of outlet obstruction. Duodenum is normally positioned as is the ligament of Treitz. No small bowel wall thickening. No small bowel dilatation. Terminal ileum normal. The appendix is not visualized, but there is no edema or inflammation in the region of the cecum. Diverticular changes are noted in the left colon without evidence of diverticulitis.  Vascular/Lymphatic: There is abdominal aortic atherosclerosis without aneurysm. Borderline gastrohepatic ligament lymphadenopathy is stable but decreased in the interval since the prior study, consistent with benign process. There is no retroperitoneal lymphadenopathy.  Reproductive: Uterus is surgically absent.  No adnexal mass.  Other: No intraperitoneal free fluid.  Musculoskeletal: Bone windows reveal no worrisome lytic or sclerotic osseous lesions.  IMPRESSION: No acute findings in the abdomen or pelvis. Specifically, no findings to explain the patient's history of nausea and vomiting.  Colonic diverticulosis without diverticulitis.  Steatosis.   Electronically Signed   By: Misty Stanley M.D.   On: 02/11/2015 08:57   Ct Head Wo Contrast  02/11/2015   CLINICAL DATA:  Altered mental status, nausea and vomiting. Shoulder surgery two days prior.  EXAM: CT HEAD WITHOUT CONTRAST  TECHNIQUE: Contiguous axial images were obtained from the base of the skull through the vertex without intravenous contrast.  COMPARISON:  None.  FINDINGS: No intracranial hemorrhage, mass effect, or  midline shift. No hydrocephalus. The basilar cisterns are patent. No evidence of territorial infarct. No intracranial fluid collection. Calvarium is intact. Included paranasal sinuses and mastoid air cells are well aerated.  IMPRESSION: No acute intracranial abnormality.   Electronically Signed   By: Jeb Levering M.D.   On: 02/11/2015 05:01   Dg Chest Portable 1 View  02/11/2015   CLINICAL DATA:  Hypoxia, dyspnea. Nausea and vomiting. Recent shoulder surgery.  EXAM: PORTABLE CHEST - 1 VIEW  COMPARISON:  None.  FINDINGS: The heart is normal in size, there is atherosclerosis of the thoracic aorta. Bibasilar atelectasis, right greater than left. Mild vascular congestion without edema. No confluent airspace disease, pleural effusion, or pneumothorax. No acute osseous abnormalities are seen.  IMPRESSION: Mild vascular congestion with bibasilar atelectasis.   Electronically Signed   By: Jeb Levering M.D.   On: 02/11/2015 04:45     Assessment and Recommendation  69 y.o. female with known sleep apnea diabetes mixed hyperlipidemia essential hypertension with the significant progression of chronic obstructive pulmonary disease and hypoxia and congestive heart failure consistent with diastolic dysfunction heart failure status post right shoulder surgery pain without current evidence of myocardial infarction 1. Continue aggressive treatment of above obstructive pulmonary disease with hypoxia and continued treatment with anti-biotics which appears to be improving over the last several days and stable 2. Oral Lasix  and continue as an outpatient 3. No further cardiac diagnostics necessary at this time 4. Begin ambulation and further treatment option changes as necessary with possible transfer to rehabilitation facility and/or home 5. Okay for discharge home from cardiac standpoint on current medical regimen with follow-up next week for adjustments of medication management and congestive heart  failure  Signed, Serafina Royals M.D. FACC

## 2015-02-15 NOTE — Clinical Social Work Placement (Signed)
   CLINICAL SOCIAL WORK PLACEMENT  NOTE  Date:  02/15/2015  Patient Details  Name: Tammy Hatfield MRN: [id number] Date of Birth: [birth year]  Clinical Social Work is seeking post-discharge placement for this patient at the [place] level of care (*CSW will initial, date and re-position this form in  chart as items are completed):  Yes   Patient/family provided with [facility] Work Department's list of facilities offering this level of care within the geographic area requested by the patient (or if unable, by the patient's family).  Yes   Patient/family informed of their freedom to choose among providers that offer the needed level of care, that participate in Medicare, Medicaid or managed care program needed by the patient, have an available bed and are willing to accept the patient.  Yes   Patient/family informed of [facility]'s ownership interest in [facility] and [facility], as well as of the fact that they are under no obligation to receive care at these facilities.  PASRR submitted to EDS on       PASRR number received on       Existing PASRR number confirmed on 02/14/15     FL2 transmitted to all facilities in geographic area requested by pt/family on 02/14/15     FL2 transmitted to all facilities within larger geographic area on 02/14/15     Patient informed that his/her managed care company has contracts with or will negotiate with certain facilities, including the following:   (SNF packet was given to pt with this information on it.)         Patient/family informed of bed offers received.  Patient chooses bed at  [facility])     Physician recommends and patient chooses bed at      Patient to be transferred to  [facility]) on 02/15/15.  Patient to be transferred to facility by  [facility])     Patient family notified on  ([facility] Daughter notified. ) of transfer.  Name of family member notified:  Tammy      PHYSICIAN       Additional Comment:    _______________________________________________ Mathews Argyle, LCSW 02/15/2015, 11:52 AM

## 2015-02-15 NOTE — Progress Notes (Addendum)
Inpatient Diabetes Program Recommendations  AACE/[name]: New Consensus Statement on Inpatient Glycemic Control (2013)  Target Ranges:  Prepandial:   less than 140 mg/dL      Peak postprandial:   less than 180 mg/dL (1-2 hours)      Critically ill patients:  140 - 180 mg/dL    Results for Tammy Hatfield, Tammy Hatfield (MRN [id number]) as of 02/15/2015 08:07  Ref. Range 02/14/2015 07:20 02/14/2015 11:03 02/14/2015 16:25 02/14/2015 19:51  Glucose-Capillary Latest Ref Range: 65-99 mg/dL 216 (H) 261 (H) 211 (H) 308 (H)    Results for Tammy Hatfield, Tammy Hatfield (MRN [id number]) as of 02/15/2015 08:07  Ref. Range 02/15/2015 07:47  Glucose-Capillary Latest Ref Range: 65-99 mg/dL 265 (H)     Admit with: Acute CHF and Acute Kidney Failure s/p Shoulder Surgery  History: DM, HTN, COPD  Home DM Meds: Glipizide 10 mg daily  70/30 insulin- 60 units AM/ 40 units PM  Current DM Orders: Lantus 10 units QHS            Novolog Moderate SSI (0-15 units) tid ac + HS   Novolog 3 units tid with meals    **Patient's PO Intake improving- Now eating 75-100% of meals.  **Has been having elevated fasting glucose levels for 2 days now.  **Note Lantus 10 units QHS started last PM.    MD- Note Lantus 10 units QHS was restarted last PM.  Since patient's PO intake seems to be improving, may want to stop Lantus and restart at least 50% of her home dose of 70/30 insulin.  Recommend the following:  1. Stop Lantus 10 units QHS  2. Stop Novolog 3 units tid with meals  3. Start 50% of patient's home dose of 70/30 insulin- 30 units with breakfast and 20 units with supper  4. Can continue Novolog Moderate (0-15 units) SSI    Will follow Wyn Quaker RN, MSN, CDE Diabetes Coordinator Inpatient Glycemic Control Team Team Pager: [phone] (8a-5p)

## 2015-02-15 NOTE — Clinical Social Work Note (Signed)
CSW notified pt, Facility, RN and pt's daughter Lynelle Smoke [phone]) that pt would DC today via EMS to [facility].  CSW signing off.

## 2015-02-15 NOTE — Discharge Summary (Signed)
[facility] [place] NAME: Tammy Hatfield    MR#:  [id number]  DATE OF BIRTH:  [birth year]  DATE OF ADMISSION:  02/11/2015 ADMITTING PHYSICIAN: Theodoro Grist, MD  DATE OF DISCHARGE: 02/15/2015  PRIMARY CARE PHYSICIAN: Adrian Prows, MD    ADMISSION DIAGNOSIS:  Renal insufficiency [N28.9] Abnormal LFTs [R79.89] Narcotic overdose, accidental or unintentional, initial encounter [T40.601A] Acute renal failure, unspecified acute renal failure type [N17.9]  DISCHARGE DIAGNOSIS:  Principal Problem:   CHF (congestive heart failure) Active Problems:   OSA on CPAP   COPD (chronic obstructive pulmonary disease)   Former smoker   HTN (hypertension)   Diabetes   Rotator cuff rupture   Hyperlipidemia   GERD (gastroesophageal reflux disease)   PUD (peptic ulcer disease)   Dysphagia   ARF (acute renal failure)   SECONDARY DIAGNOSIS:   Past Medical History  Diagnosis Date  . Ulcer, stomach peptic     history of  . Diabetes mellitus without complication   . Hypercholesteremia   . Esophageal reflux   . Hyperlipidemia   . Hypertension   . Arthritis     knees  . Degenerative lumbar disc   . Barrett's esophagus   . History of Helicobacter pylori infection   . Dysphagia   . COPD (chronic obstructive pulmonary disease) 2012  . Cancer 2014    right breast     HOSPITAL COURSE:   69 year old female with past medical history of type 2 diabetes without consultation, hyperlipidemia, GERD, hypertension, COPD, history of breast cancer, peptic ulcer disease, who presented to the hospital due to altered mental status and noted to be in acute renal failure with hyperkalemia and also noted to have abnormal LFTs.  #1 acute encephalopathy-this was likely metabolic encephalopathy secondary to acute renal failure, continue with use of pain medications. Patient was in renal failure and was taking Percocet and tramadol which was likely causing  her mental status change. -Patient did have a CT head on admission which showed no acute abnormality. Patient was started on IV fluids for acute renal failure. Patient was also noted to have an elevated ammonia and therefore was started on lactulose and Xifaxan. - Since patient's renal function has improved, mental status is now back to baseline and her pain meds have been reintroduced but her mental status remains stable  #2 acute renal failure-this was likely secondary to dehydration and poor by mouth intake and has not improved and resolved with IV fluid hydration.  #3 acute CHF-this was likely secondary to the aggressive hydration she received due to her renal failure. She had an echocardiogram which showed ejection fraction of 60%. -She was diuresed with IV Lasix and has clinically improved and is currently being discharged on oral Lasix daily. Patient was seen by cardiology and they agreed with this management.  #4 abnormal LFTs-this is likely secondary to use of her pain medications as she was taking increasing amount of Tylenol. Patient did have hepatitis serology sent which were negative. Patient also had abdominal ultrasound which showed just fatty liver disease but no other acute pathology. -Patient was seen by Gastroenterology by Dr. Gustavo Lah who recommended further follow-up of LFTs as an outpatient and no acute intervention at this point.  #5 hyperkalemia-this was likely secondary to acute renal failure this has not improved and resolved.  #6 type 2 diabetes without compensation patient has had no hypoglycemic episodes she was she will continue her scheduled insulin as stated. - Continue glipizide  #  7 anxiety-patient will continue her Paxil and Xanax.    #8 status post right arthroscopic subacromial decompression plus release of the long head of the biceps tendon followed by arthroscopic rotator cuff repair - patient follows up with Dr. Leanor Kail.  She has a follow-up  appointment with him this coming Friday.  She was seen by physical therapy and thought she would benefit from short-term rehabilitation which is where she is presently being discharged.  DISCHARGE CONDITIONS:   Stable  CONSULTS OBTAINED:  Treatment Team:  Corey Skains, MD Munsoor Holley Raring, MD Lollie Sails, MD  DRUG ALLERGIES:   Allergies  Allergen Reactions  . Metformin And Related Diarrhea  . Amoxicillin Other (See Comments)    Reaction: Yeast infection  . Avelox [Moxifloxacin] Other (See Comments)    Reaction: Muscle pain  . Benazepril Cough  . Byetta 10 Mcg Pen [Exenatide] Diarrhea  . Neurontin [Gabapentin] Other (See Comments)    Reaction: Mouth blister, joint pain and depression  . Statins Other (See Comments)    Reaction: Mouth blisters and joint pain  . Sulfa Antibiotics Other (See Comments)    Reaction: Headache  . Ceclor [Cefaclor] Rash    DISCHARGE MEDICATIONS:   Current Discharge Medication List    START taking these medications   Details  furosemide (LASIX) 40 MG tablet Take 1 tablet (40 mg total) by mouth daily. Qty: 30 tablet, Refills: 1    oxyCODONE (OXY IR/ROXICODONE) 5 MG immediate release tablet Take 1 tablet (5 mg total) by mouth every 8 (eight) hours as needed for severe pain. Qty: 30 tablet, Refills: 0      CONTINUE these medications which have NOT CHANGED   Details  albuterol (PROVENTIL HFA;VENTOLIN HFA) 108 (90 BASE) MCG/ACT inhaler Inhale 2 puffs into the lungs every 4 (four) hours as needed for wheezing or shortness of breath.    ALPRAZolam (XANAX) 0.5 MG tablet Take 0.5 mg by mouth 2 (two) times daily as needed for anxiety.    budesonide-formoterol (SYMBICORT) 160-4.5 MCG/ACT inhaler Inhale 2 puffs into the lungs 2 (two) times daily as needed (for shortness of breath).     glipiZIDE (GLUCOTROL) 10 MG tablet Take 10 mg by mouth daily before breakfast.    insulin aspart protamine- aspart (NOVOLOG MIX 70/30) (70-30) 100 UNIT/ML  injection Inject 40-60 Units into the skin 2 (two) times daily. Inject 60 units into the skin daily with breakfast. Inject 40 units into the skin daily with supper.    loratadine (CLARITIN) 10 MG tablet Take 10 mg by mouth every morning.     losartan (COZAAR) 50 MG tablet Take 50 mg by mouth every morning.     omeprazole (PRILOSEC) 20 MG capsule Take 40 mg by mouth every morning.     PARoxetine (PAXIL) 40 MG tablet Take 40 mg by mouth every morning.    traMADol (ULTRAM) 50 MG tablet Take 50 mg by mouth every 6 (six) hours as needed for moderate pain.       STOP taking these medications     ROXICET 5-325 MG per tablet          DISCHARGE INSTRUCTIONS:   DIET:  Cardiac diet  DISCHARGE CONDITION:  Stable  ACTIVITY:  Activity as tolerated  OXYGEN:  Home Oxygen: No.   Oxygen Delivery: room air  DISCHARGE LOCATION:  nursing home   If you experience worsening of your admission symptoms, develop shortness of breath, life threatening emergency, suicidal or homicidal thoughts you must seek medical attention  immediately by calling 911 or calling your MD immediately  if symptoms less severe.  You Must read complete instructions/literature along with all the possible adverse reactions/side effects for all the Medicines you take and that have been prescribed to you. Take any new Medicines after you have completely understood and accpet all the possible adverse reactions/side effects.   Please note  You were cared for by a hospitalist during your hospital stay. If you have any questions about your discharge medications or the care you received while you were in the hospital after you are discharged, you can call the unit and asked to speak with the hospitalist on call if the hospitalist that took care of you is not available. Once you are discharged, your primary care physician will handle any further medical issues. Please note that NO REFILLS for any discharge medications will be  authorized once you are discharged, as it is imperative that you return to your primary care physician (or establish a relationship with a primary care physician if you do not have one) for your aftercare needs so that they can reassess your need for medications and monitor your lab values.     Today    VITAL SIGNS:  Blood pressure 141/59, pulse 83, temperature 98.1 F (36.7 C), temperature source Oral, resp. rate 20, height '5\' 4"'$  (1.626 m), weight 97.297 kg (214 lb 8 oz), SpO2 94 %.  I/O:   Intake/Output Summary (Last 24 hours) at 02/15/15 1058 Last data filed at 02/15/15 0908  Gross per 24 hour  Intake    720 ml  Output    350 ml  Net    370 ml    PHYSICAL EXAMINATION:  GENERAL:  69 y.o.-year-old patient lying in the bed with no acute distress.  EYES: Pupils equal, round, reactive to light and accommodation. No scleral icterus. Extraocular muscles intact.  HEENT: Head atraumatic, normocephalic. Oropharynx and nasopharynx clear.  NECK:  Supple, no jugular venous distention. No thyroid enlargement, no tenderness.  LUNGS: Normal breath sounds bilaterally, no wheezing, rales,rhonchi or crepitation. No use of accessory muscles of respiration.  CARDIOVASCULAR: S1, S2 normal. No murmurs, rubs, or gallops.  ABDOMEN: Soft, non-tender, non-distended. Bowel sounds present. No organomegaly or mass.  EXTREMITIES: No pedal edema, cyanosis, or clubbing.  NEUROLOGIC: Cranial nerves II through XII are intact. No focal motor or sensory defecits b/l.  PSYCHIATRIC: The patient is alert and oriented x 3.  SKIN: No obvious rash, lesion, or ulcer.   DATA REVIEW:   CBC  Recent Labs Lab 02/11/15 1443  WBC 6.8  HGB 11.2*  HCT 34.3*  PLT 148*    Chemistries   Recent Labs Lab 02/15/15 0349  NA 140  K 3.4*  CL 96*  CO2 36*  GLUCOSE 229*  BUN 13  CREATININE 0.90  CALCIUM 8.9  AST 47*  ALT 310*  ALKPHOS 109  BILITOT 0.7    Cardiac Enzymes No results for input(s): TROPONINI in  the last 168 hours.  Microbiology Results  No results found for this or any previous visit.  RADIOLOGY:  No results found.    Management plans discussed with the patient, family and they are in agreement.  CODE STATUS:     Code Status Orders        Start     Ordered   02/11/15 1418  Full code   Continuous     02/11/15 1417    Advance Directive Documentation        Most Recent  Value   Type of Advance Directive  Healthcare Power of Attorney, Living will [Son]   Pre-existing out of facility DNR order (yellow form or pink MOST form)     "MOST" Form in Place?        TOTAL TIME TAKING CARE OF THIS PATIENT: 45  minutes.    Henreitta Leber M.D on 02/15/2015 at 10:58 AM  Between 7am to 6pm - Pager - [phone]  After 6pm go to [email or web address] - password [email or web address] [facility]  [facility]  Office  [phone]  CC: Primary care physician; Adrian Prows, MD

## 2015-03-10 DIAGNOSIS — Z9889 Other specified postprocedural states: Secondary | ICD-10-CM | POA: Insufficient documentation

## 2015-03-23 ENCOUNTER — Institutional Professional Consult (permissible substitution): Payer: Medicare Other | Admitting: Internal Medicine

## 2015-04-26 ENCOUNTER — Encounter: Payer: Self-pay | Admitting: Internal Medicine

## 2015-04-26 ENCOUNTER — Ambulatory Visit (INDEPENDENT_AMBULATORY_CARE_PROVIDER_SITE_OTHER): Payer: Medicare Other | Admitting: Internal Medicine

## 2015-04-26 VITALS — BP 136/70 | HR 103 | Ht 64.0 in | Wt 213.0 lb

## 2015-04-26 DIAGNOSIS — G4733 Obstructive sleep apnea (adult) (pediatric): Secondary | ICD-10-CM | POA: Diagnosis not present

## 2015-04-26 DIAGNOSIS — J449 Chronic obstructive pulmonary disease, unspecified: Secondary | ICD-10-CM | POA: Diagnosis not present

## 2015-04-26 DIAGNOSIS — Z9989 Dependence on other enabling machines and devices: Principal | ICD-10-CM

## 2015-04-26 NOTE — Progress Notes (Signed)
Date: 04/26/2015  MRN# [id number] Tammy Hatfield 1946/01/17  Referring Physician: self referral PMD - Dr. Ola Spurr [facility]) Previous pulmonologist - Dr. Jaci Standard is a 69 y.o. old female seen in consultation for transition of care to [facility] from [facility] (Dr. Raul Del).   CC:  Chief Complaint  Patient presents with  . Advice Only    COPD; cough; switching doctors     HPI:  Patient is a pleasant 69 year old female with past medical history of recent breast cancer, COPD stage II, obesity, diabetes, hypertension, chronic sinusitis, transitioning care from [facility], Dr. Vella Kohler. Patient states she has COPD management well with Symbicort and as needed albuterol. She does have intermittent shortness of breath and cough when there is rapid weather changes, during the spring, high pollen days, high heat days. She carries a chronic nonproductive cough, morbid throat clearing. She has had no ED or urgent care visits for pulmonary issues in the last 3 years. She states her primary care physician, Dr. Ola Spurr, we'll prescribe her a Z-Pak when a suspected URI his upcoming. She follows with Dr. Vella Kohler for sleep apnea and is on CPAP. Patient quit smoking about 15 years ago, prior smoked one pack per day for about 20 years, has 2 dogs and 1 cat at home, previously worked as a Corporate treasurer.   Review of Medical records by Dr. Stevenson Clinch Office Visit with Dr. Raul Del 03/02/15  Follow-up  History of Present Illness: Tammy Hatfield is a 69 y.o. female that presents to clinic for stage II copd, doing better, since last seen was admitted to [facility] With CHF (diastolic dysfunction), COPD flare. Today no chest pain, ectopy, syncope, leg edema or calf pain. There is o wheezing, coughing or hemoptysis. A sensation odf depression, no suicidial ideation.  Current Medications:  Current Outpatient Prescriptions  Medication Sig Dispense Refill  . acetaminophen  (TYLENOL) 650 MG ER tablet Take 650 mg by mouth every 4 (four) hours as needed for Pain.  Marland Kitchen albuterol 90 mcg/actuation inhaler Inhale 2 inhalations into the lungs every 4 (four) hours as needed for Wheezing. 1 Inhaler 0  . ALPRAZolam (XANAX) 0.5 MG tablet TAKE ONE TABLET BY MOUTH TWICE DAILY AS NEEDED 60 tablet 5  . blood glucose diagnostic test strip Use 3 (three) times daily. Use as instructed. 100 each 11  . budesonide-formoterol (SYMBICORT) 160-4.5 mcg/actuation inhaler Inhale 2 inhalations into the lungs 2 (two) times daily. 1 Inhaler 12  . FREESTYLE LITE STRIPS test strip CHECK BLOOD GLUCOSE THREE TIMES DAILY 100 each 0  . FUROsemide (LASIX) 40 MG tablet Take 40 mg by mouth once daily.  Marland Kitchen glipiZIDE (GLUCOTROL XL) 10 MG XL tablet TAKE ONE TABLET BY MOUTH IN THE MORNING 30 tablet 0  . glipiZIDE (GLUCOTROL) 10 MG tablet Take 10 mg by mouth 2 (two) times daily before meals.  . insulin NPH-REGULAR (HUMULIN, NOVOLIN 70/30) 100 unit/mL (70-30) injection Inject 60 units before breakfast and 45 units before supper. Humulin 70/30 40 mL 5  . insulin syringe-needle U-100 1 mL 31 x 5/16" syringe USE AS DIRECTED TWICE DAILY 60 each 3  . loperamide (IMODIUM A-D) 2 mg tablet Take 2 mg by mouth 3 (three) times daily as needed for Diarrhea.  . loratadine (CLARITIN) 10 mg tablet Take 10 mg by mouth once daily as needed.  Marland Kitchen losartan (COZAAR) 50 MG tablet TAKE ONE TABLET BY MOUTH ONCE DAILY**STOP BENAZEPRIL** 30 tablet 5  . omeprazole (PRILOSEC) 20 MG DR capsule Take 1  capsule (20 mg total) by mouth 2 (two) times daily. 60 capsule 11  . PARoxetine (PAXIL) 40 MG tablet Take 1 tablet (40 mg total) by mouth once daily. Take 1-1/2 tablets po qd 45 tablet 11  . tiotropium (SPIRIVA) 18 mcg inhalation capsule Place 18 mcg into inhaler and inhale once daily.  . traMADol (ULTRAM) 50 mg tablet TAKE ONE TABLET BY MOUTH EVERY 6 HOURS AS NEEDED 100 tablet 3   Impression: 1. Obstructive sleep apnea on CPAP with positive  response.  2. Early stage II copd, ex smoker, stable 3. Overweight 4. Rhinitis, controlled  Plan: -continue cpap on same settings -watch weight -prn albuterol, spiriva, symbicort -follow up in 3 months   PMHX:   Past Medical History  Diagnosis Date  . Ulcer, stomach peptic     history of  . Diabetes mellitus without complication   . Hypercholesteremia   . Esophageal reflux   . Hyperlipidemia   . Hypertension   . Arthritis     knees  . Degenerative lumbar disc   . Barrett's esophagus   . History of Helicobacter pylori infection   . Dysphagia   . COPD (chronic obstructive pulmonary disease) 2012  . Cancer 2014    right breast - s\p Radiation and lumpectomy and LN dissection  . Chronic sinusitis    Surgical Hx:  Past Surgical History  Procedure Laterality Date  . Uvulopalatopharyngoplasty N/A 2001  . Abdominal hysterectomy    . Laparoscopic tubal ligation Bilateral 1967  . Umbilical hernia repair N/A   . Bunionectomy Bilateral 1980  . Breast lumpectomy Right 2013  . Shoulder arthroscopy with rotator cuff repair Right 02/09/2015    Procedure: SHOULDER ARTHROSCOPY WITH ROTATOR CUFF REPAIR,release long head biceps tendon,subacromial decompression.;  Surgeon: Leanor Kail, MD;  Location: [facility] ORS;  Service: Orthopedics;  Laterality: Right;   Family Hx:  History reviewed. No pertinent family history. Social Hx:   Social History  Substance Use Topics  . Smoking status: Former Smoker    Quit date: 02/01/2000  . Smokeless tobacco: Never Used     Comment: former smoker 1ppd x 20 years  . Alcohol Use: No   Medication:   Current Outpatient Rx  Name  Route  Sig  Dispense  Refill  . albuterol (PROVENTIL HFA;VENTOLIN HFA) 108 (90 BASE) MCG/ACT inhaler   Inhalation   Inhale 2 puffs into the lungs every 4 (four) hours as needed for wheezing or shortness of breath.         . ALPRAZolam (XANAX) 0.5 MG tablet   Oral   Take 0.5 mg by mouth 2 (two) times daily as needed  for anxiety.         . budesonide-formoterol (SYMBICORT) 160-4.5 MCG/ACT inhaler   Inhalation   Inhale 2 puffs into the lungs 2 (two) times daily as needed (for shortness of breath).          . furosemide (LASIX) 40 MG tablet   Oral   Take 1 tablet (40 mg total) by mouth daily.   30 tablet   1   . glipiZIDE (GLUCOTROL) 10 MG tablet   Oral   Take 10 mg by mouth daily before breakfast.         . insulin NPH Human (HUMULIN N,NOVOLIN N) 100 UNIT/ML injection   Subcutaneous   Inject into the skin. 60 units in AM 40 units in PM         . loratadine (CLARITIN) 10 MG tablet   Oral  Take 10 mg by mouth daily as needed.          Marland Kitchen losartan (COZAAR) 50 MG tablet   Oral   Take 50 mg by mouth every morning.          Marland Kitchen omeprazole (PRILOSEC) 20 MG capsule   Oral   Take 40 mg by mouth every morning.          Marland Kitchen PARoxetine (PAXIL) 40 MG tablet   Oral   Take 40 mg by mouth every morning.         . traMADol (ULTRAM) 50 MG tablet   Oral   Take 50 mg by mouth every 6 (six) hours as needed for moderate pain.              Allergies:  Metformin and related; Amoxicillin; Avelox; Benazepril; Byetta 10 mcg pen; Neurontin; Statins; Sulfa antibiotics; and Ceclor  Review of Systems: Gen:  Denies  fever, sweats, chills HEENT: Denies blurred vision, double vision, ear pain, eye pain, hearing loss, nose bleeds, sore throat Cvc:  No dizziness, chest pain or heaviness Resp:   Mild non productive cough Gi: Denies swallowing difficulty, stomach pain, nausea or vomiting, diarrhea, constipation, bowel incontinence Gu:  Denies bladder incontinence, burning urine Ext:   No Joint pain, stiffness or swelling Skin: No skin rash, easy bruising or bleeding or hives Endoc:  No polyuria, polydipsia , polyphagia or weight change Psych: No depression, insomnia or hallucinations  Other:  All other systems negative  Physical Examination:   VS: BP 136/70 mmHg  Pulse 103  Ht '5\' 4"'$  (1.626  m)  Wt 213 lb (96.616 kg)  BMI 36.54 kg/m2  SpO2 95%  General Appearance: No distress  Neuro:without focal findings, mental status, speech normal, alert and oriented, cranial nerves 2-12 intact, reflexes normal and symmetric, sensation grossly normal  HEENT: PERRLA, EOM intact, no ptosis, no other lesions noticed; Mallampati 1 Pulmonary: normal breath sounds., diaphragmatic excursion normal.No wheezing, No rales;   Sputum Production:  non CardiovascularNormal S1,S2.  No m/r/g.  Abdominal aorta pulsation normal.    Abdomen: Benign, Soft, non-tender, No masses, hepatosplenomegaly, No lymphadenopathy Renal:  No costovertebral tenderness  GU:  No performed at this time. Endoc: No evident thyromegaly, no signs of acromegaly or Cushing features Skin:   warm, no rashes, no ecchymosis  Extremities: normal, no cyanosis, clubbing, no edema, warm with normal capillary refill. Other findings:    Rad results: (The following images and results were reviewed by Dr. Stevenson Clinch). CXR 02/2015 FINDINGS: The heart is normal in size, there is atherosclerosis of the thoracic aorta. Bibasilar atelectasis, right greater than left. Mild vascular congestion without edema. No confluent airspace disease, pleural effusion, or pneumothorax. No acute osseous abnormalities are seen.  IMPRESSION: Mild vascular congestion with bibasilar atelectasis.     Assessment and Plan: 69 year old female transitioning care from [facility], history of COPD, OSA. OSA on CPAP Followed by Dr. Raul Del, currently on CPAP, currently compliant. Advised patient to continue with good compliance, using CPAP 7 nights per week for a minimum of 6 hours per night.  Plan: -Continue OSA management with Dr. Vella Kohler.  COPD (chronic obstructive pulmonary disease) History of stage II COPD Former smoker Well-controlled at this time Current regimen includes Symbicort and as needed albuterol (patient states he is albuterol 1-2 times per  month) Review of records performed  COPD CAT = 3 mMRC = 24  Will determine any change in therapy after her PFTs.  Based on CAT and mMRC,  patient will be classed as a GRADE B COPD.   Plan: -Continue with Symbicort and as needed albuterol -Continue to avoid tobacco smoke -Exercise, diet, weight loss -We will obtain a copy of previous chest x-rays and primary function tests from prior pulmonologist -PFTs and 6 minute walk test prior to follow-up    Updated Medication List Outpatient Encounter Prescriptions as of 04/26/2015  Medication Sig  . albuterol (PROVENTIL HFA;VENTOLIN HFA) 108 (90 BASE) MCG/ACT inhaler Inhale 2 puffs into the lungs every 4 (four) hours as needed for wheezing or shortness of breath.  . ALPRAZolam (XANAX) 0.5 MG tablet Take 0.5 mg by mouth 2 (two) times daily as needed for anxiety.  . budesonide-formoterol (SYMBICORT) 160-4.5 MCG/ACT inhaler Inhale 2 puffs into the lungs 2 (two) times daily as needed (for shortness of breath).   . furosemide (LASIX) 40 MG tablet Take 1 tablet (40 mg total) by mouth daily.  Marland Kitchen glipiZIDE (GLUCOTROL) 10 MG tablet Take 10 mg by mouth daily before breakfast.  . insulin NPH Human (HUMULIN N,NOVOLIN N) 100 UNIT/ML injection Inject into the skin. 60 units in AM 40 units in PM  . loratadine (CLARITIN) 10 MG tablet Take 10 mg by mouth daily as needed.   Marland Kitchen losartan (COZAAR) 50 MG tablet Take 50 mg by mouth every morning.   Marland Kitchen omeprazole (PRILOSEC) 20 MG capsule Take 40 mg by mouth every morning.   Marland Kitchen PARoxetine (PAXIL) 40 MG tablet Take 40 mg by mouth every morning.  . traMADol (ULTRAM) 50 MG tablet Take 50 mg by mouth every 6 (six) hours as needed for moderate pain.   . [DISCONTINUED] insulin aspart protamine- aspart (NOVOLOG MIX 70/30) (70-30) 100 UNIT/ML injection Inject 40-60 Units into the skin 2 (two) times daily. Inject 60 units into the skin daily with breakfast. Inject 40 units into the skin daily with supper.  . [DISCONTINUED]  oxyCODONE (OXY IR/ROXICODONE) 5 MG immediate release tablet Take 1 tablet (5 mg total) by mouth every 8 (eight) hours as needed for severe pain.   No facility-administered encounter medications on file as of 04/26/2015.    Orders for this visit: No orders of the defined types were placed in this encounter.     Thank  you for the consultation and for allowing [facility] to assist in the care of your patient. Our recommendations are noted above.  Please contact us if we can be of further service.   Vilinda Boehringer, MD  Pulmonary and Critical Care Office Number: [phone]

## 2015-04-26 NOTE — Patient Instructions (Addendum)
Follow up with Dr. Stevenson Clinch in 3 months - pulmonary function testing and 6 minute walk test prior to follow up - we will obtain a copy of your last PFTs from Dr. Gust Brooms office - con with Symbicort - gargle and rinse after each use.  - albuterol RESCUE inhaler - 2puff every 3-4 hours as needed for shortness of breath\wheezing\recurrent cough - we will also obtain a copy of your last sleep study - continue with Follow with Dr. Raul Del for your OSA, sleep apnea orders and supplies.

## 2015-04-26 NOTE — Assessment & Plan Note (Signed)
Followed by Dr. Raul Del, currently on CPAP, currently compliant. Advised patient to continue with good compliance, using CPAP 7 nights per week for a minimum of 6 hours per night.  Plan: -Continue OSA management with Dr. Vella Kohler.

## 2015-04-26 NOTE — Assessment & Plan Note (Addendum)
History of stage II COPD Former smoker Well-controlled at this time Current regimen includes Symbicort and as needed albuterol (patient states he is albuterol 1-2 times per month) Review of records performed  COPD CAT = 3 mMRC = 24  Will determine any change in therapy after her PFTs.  Based on CAT and mMRC, patient will be classed as a GRADE B COPD.   Plan: -Continue with Symbicort and as needed albuterol -Continue to avoid tobacco smoke -Exercise, diet, weight loss -We will obtain a copy of previous chest x-rays and primary function tests from prior pulmonologist -PFTs and 6 minute walk test prior to follow-up

## 2015-04-28 DIAGNOSIS — M1712 Unilateral primary osteoarthritis, left knee: Secondary | ICD-10-CM | POA: Insufficient documentation

## 2015-04-28 LAB — HEPATITIS C ANTIBODY (REFLEX): HCV AB: 0.1 {s_co_ratio} (ref 0.0–0.9)

## 2015-06-20 ENCOUNTER — Inpatient Hospital Stay: Payer: Medicare Other | Attending: Oncology | Admitting: Oncology

## 2015-06-20 VITALS — BP 124/74 | HR 72 | Temp 97.1°F | Resp 18 | Wt 215.4 lb

## 2015-06-20 DIAGNOSIS — Z9223 Personal history of estrogen therapy: Secondary | ICD-10-CM

## 2015-06-20 DIAGNOSIS — I10 Essential (primary) hypertension: Secondary | ICD-10-CM | POA: Insufficient documentation

## 2015-06-20 DIAGNOSIS — M170 Bilateral primary osteoarthritis of knee: Secondary | ICD-10-CM | POA: Diagnosis not present

## 2015-06-20 DIAGNOSIS — Z794 Long term (current) use of insulin: Secondary | ICD-10-CM

## 2015-06-20 DIAGNOSIS — J449 Chronic obstructive pulmonary disease, unspecified: Secondary | ICD-10-CM | POA: Diagnosis not present

## 2015-06-20 DIAGNOSIS — Z79899 Other long term (current) drug therapy: Secondary | ICD-10-CM | POA: Diagnosis not present

## 2015-06-20 DIAGNOSIS — M5136 Other intervertebral disc degeneration, lumbar region: Secondary | ICD-10-CM | POA: Insufficient documentation

## 2015-06-20 DIAGNOSIS — E7800 Pure hypercholesterolemia, unspecified: Secondary | ICD-10-CM | POA: Insufficient documentation

## 2015-06-20 DIAGNOSIS — Z87891 Personal history of nicotine dependence: Secondary | ICD-10-CM | POA: Diagnosis not present

## 2015-06-20 DIAGNOSIS — E1165 Type 2 diabetes mellitus with hyperglycemia: Secondary | ICD-10-CM

## 2015-06-20 DIAGNOSIS — Z923 Personal history of irradiation: Secondary | ICD-10-CM

## 2015-06-20 DIAGNOSIS — K219 Gastro-esophageal reflux disease without esophagitis: Secondary | ICD-10-CM | POA: Diagnosis not present

## 2015-06-20 DIAGNOSIS — D649 Anemia, unspecified: Secondary | ICD-10-CM | POA: Diagnosis not present

## 2015-06-20 DIAGNOSIS — Z853 Personal history of malignant neoplasm of breast: Secondary | ICD-10-CM | POA: Insufficient documentation

## 2015-06-20 DIAGNOSIS — Z7982 Long term (current) use of aspirin: Secondary | ICD-10-CM | POA: Diagnosis not present

## 2015-06-20 DIAGNOSIS — C50911 Malignant neoplasm of unspecified site of right female breast: Secondary | ICD-10-CM

## 2015-06-28 ENCOUNTER — Other Ambulatory Visit: Payer: Self-pay | Admitting: Unknown Physician Specialty

## 2015-06-28 DIAGNOSIS — M25511 Pain in right shoulder: Secondary | ICD-10-CM

## 2015-06-28 DIAGNOSIS — Z9889 Other specified postprocedural states: Secondary | ICD-10-CM

## 2015-07-03 NOTE — Progress Notes (Signed)
[place]  Telephone:([phone]) [phone] [phone]) [phone]  ID: Tammy Hatfield OB: [birth year]  MR#: [id number]  QAS#:[id number]  Patient Care Team: Adrian Prows, MD as PCP - General (Infectious Diseases)  CHIEF COMPLAINT:  Chief Complaint  Patient presents with  . Breast Cancer    INTERVAL HISTORY: Patient returns to clinic today for routine 6 month evaluation.  Patient currently is not taking any aromatase inhibitors.  She continues to feel well and is asymptomatic.  She denies any neurologic symptoms.  She has a good appetite.  She denies any chest pain or shortness of breath.  She denies any nausea, vomiting, constipation, or diarrhea.  She has no urinary complaints.  Patient offers no specific complaints today.   REVIEW OF SYSTEMS:   Review of Systems  Constitutional: Negative.   Cardiovascular: Negative.   Gastrointestinal: Negative.   Musculoskeletal: Negative.   Neurological: Negative.     As per HPI. Otherwise, a complete review of systems is negatve.  PAST MEDICAL HISTORY: Past Medical History  Diagnosis Date  . Ulcer, stomach peptic     history of  . Diabetes mellitus without complication   . Hypercholesteremia   . Esophageal reflux   . Hyperlipidemia   . Hypertension   . Arthritis     knees  . Degenerative lumbar disc   . Barrett's esophagus   . History of Helicobacter pylori infection   . Dysphagia   . COPD (chronic obstructive pulmonary disease) 2012  . Cancer 2014    right breast - s\p Radiation and lumpectomy and LN dissection  . Chronic sinusitis     PAST SURGICAL HISTORY: Past Surgical History  Procedure Laterality Date  . Uvulopalatopharyngoplasty N/A 2001  . Abdominal hysterectomy    . Laparoscopic tubal ligation Bilateral 1967  . Umbilical hernia repair N/A   . Bunionectomy Bilateral 1980  . Breast lumpectomy Right 2013  . Shoulder arthroscopy with rotator cuff repair Right 02/09/2015    Procedure: SHOULDER  ARTHROSCOPY WITH ROTATOR CUFF REPAIR,release long head biceps tendon,subacromial decompression.;  Surgeon: Leanor Kail, MD;  Location: [facility] ORS;  Service: Orthopedics;  Laterality: Right;    FAMILY HISTORY No family history on file.     ADVANCED DIRECTIVES:    HEALTH MAINTENANCE: Social History  Substance Use Topics  . Smoking status: Former Smoker    Quit date: 02/01/2000  . Smokeless tobacco: Never Used     Comment: former smoker 1ppd x 20 years  . Alcohol Use: No     Colonoscopy:  PAP:  Bone density:  Lipid panel:  Allergies  Allergen Reactions  . Metformin And Related Diarrhea  . Amoxicillin Other (See Comments)    Reaction: Yeast infection  . Avelox [Moxifloxacin] Other (See Comments)    Reaction: Muscle pain  . Benazepril Cough  . Byetta 10 Mcg Pen [Exenatide] Diarrhea  . Metformin Diarrhea  . Neurontin [Gabapentin] Other (See Comments)    Reaction: Mouth blister, joint pain and depression  . Statins Other (See Comments)    Reaction: Mouth blisters and joint pain  . Sulfa Antibiotics Other (See Comments)    Reaction: Headache  . Ceclor [Cefaclor] Rash    Current Outpatient Prescriptions  Medication Sig Dispense Refill  . aspirin EC 81 MG tablet Take by mouth.    Marland Kitchen albuterol (PROVENTIL HFA;VENTOLIN HFA) 108 (90 BASE) MCG/ACT inhaler Inhale 2 puffs into the lungs every 4 (four) hours as needed for wheezing or shortness of breath.    . ALPRAZolam (XANAX) 0.5 MG  tablet Take 0.5 mg by mouth 2 (two) times daily as needed for anxiety.    . budesonide-formoterol (SYMBICORT) 160-4.5 MCG/ACT inhaler Inhale 2 puffs into the lungs 2 (two) times daily as needed (for shortness of breath).     . furosemide (LASIX) 40 MG tablet Take 1 tablet (40 mg total) by mouth daily. 30 tablet 1  . glipiZIDE (GLUCOTROL) 10 MG tablet Take 10 mg by mouth daily before breakfast.    . insulin NPH Human (HUMULIN N,NOVOLIN N) 100 UNIT/ML injection Inject into the skin. 60 units in AM 40  units in PM    . loratadine (CLARITIN) 10 MG tablet Take 10 mg by mouth daily as needed.     Marland Kitchen losartan (COZAAR) 50 MG tablet Take 50 mg by mouth every morning.     Marland Kitchen omeprazole (PRILOSEC) 20 MG capsule Take 40 mg by mouth every morning.     Marland Kitchen PARoxetine (PAXIL) 40 MG tablet Take 40 mg by mouth every morning.    . traMADol (ULTRAM) 50 MG tablet Take 50 mg by mouth every 6 (six) hours as needed for moderate pain.      No current facility-administered medications for this visit.    OBJECTIVE: Filed Vitals:   06/20/15 1622  BP: 124/74  Pulse: 72  Temp: 97.1 F (36.2 C)  Resp: 18     Body mass index is 36.95 kg/(m^2).    ECOG FS:0 - Asymptomatic  General: Well-developed, well-nourished, no acute distress. Eyes: Pink conjunctiva, anicteric sclera. Breasts: Bilateral breasts and axilla without lumps or masses. Lungs: Clear to auscultation bilaterally. Heart: Regular rate and rhythm. No rubs, murmurs, or gallops. Abdomen: Soft, nontender, nondistended. No organomegaly noted, normoactive bowel sounds. Musculoskeletal: No edema, cyanosis, or clubbing. Neuro: Alert, answering all questions appropriately. Cranial nerves grossly intact. Skin: No rashes or petechiae noted. Psych: Normal affect.   LAB RESULTS:  Lab Results  Component Value Date   NA 140 02/15/2015   K 3.4* 02/15/2015   CL 96* 02/15/2015   CO2 36* 02/15/2015   GLUCOSE 229* 02/15/2015   BUN 13 02/15/2015   CREATININE 0.90 02/15/2015   CALCIUM 8.9 02/15/2015   PROT 6.7 02/15/2015   ALBUMIN 3.4* 02/15/2015   AST 47* 02/15/2015   ALT 310* 02/15/2015   ALKPHOS 109 02/15/2015   BILITOT 0.7 02/15/2015   GFRNONAA >60 02/15/2015   GFRAA >60 02/15/2015    Lab Results  Component Value Date   WBC 6.8 02/11/2015   NEUTROABS 9.5* 02/11/2015   HGB 11.2* 02/11/2015   HCT 34.3* 02/11/2015   MCV 91.0 02/11/2015   PLT 148* 02/11/2015     STUDIES: No results found.  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  PLAN:    1.  Breast cancer: Since patient's Oncotype DX was low risk, she did not require adjuvant chemotherapy.  She completed XRT in approximately January 2015. Patient could not tolerate letrozole, anastrozole, or Aromasin. She has elected to discontinue all aromatase inhibitors despite knowing her risk of recurrence increases. Her most recent mammogram on November 11, 2014 was reported as BI-RADS 3, repeat in the next 1-2 weeks. Return to clinic in 6 months for routine evaluation. 2. Anemia: Mild, monitor.   3. Hyperglycemia: Continue diabetic medication as prescribed. Treatment per PCP.  Patient expressed understanding and was in agreement with this plan. She also understands that She can call clinic at any time with any questions, concerns, or complaints.  Lloyd Huger, MD   07/03/2015 10:42 AM

## 2015-07-05 ENCOUNTER — Ambulatory Visit
Admission: RE | Admit: 2015-07-05 | Discharge: 2015-07-05 | Disposition: A | Discharge status: Home / Self Care | Payer: Medicare Other | Source: Ambulatory Visit | Attending: Oncology | Admitting: Oncology

## 2015-07-05 ENCOUNTER — Other Ambulatory Visit: Payer: Self-pay | Admitting: Oncology

## 2015-07-05 DIAGNOSIS — Z853 Personal history of malignant neoplasm of breast: Secondary | ICD-10-CM | POA: Insufficient documentation

## 2015-07-05 DIAGNOSIS — Z08 Encounter for follow-up examination after completed treatment for malignant neoplasm: Secondary | ICD-10-CM | POA: Insufficient documentation

## 2015-07-05 DIAGNOSIS — C50911 Malignant neoplasm of unspecified site of right female breast: Secondary | ICD-10-CM

## 2015-07-05 HISTORY — DX: Malignant neoplasm of unspecified site of unspecified female breast: C50.919

## 2015-07-05 HISTORY — DX: Reserved for concepts with insufficient information to code with codable children: IMO0002

## 2015-07-06 ENCOUNTER — Telehealth: Payer: Self-pay | Admitting: Internal Medicine

## 2015-07-06 NOTE — Telephone Encounter (Signed)
Spoke with pt and scheduled SMW/PFT. Nothing further needed.

## 2015-07-07 ENCOUNTER — Ambulatory Visit
Admission: RE | Admit: 2015-07-07 | Discharge: 2015-07-07 | Disposition: A | Discharge status: Home / Self Care | Payer: Medicare Other | Source: Ambulatory Visit | Attending: Unknown Physician Specialty | Admitting: Unknown Physician Specialty

## 2015-07-07 DIAGNOSIS — Z9889 Other specified postprocedural states: Secondary | ICD-10-CM

## 2015-07-07 DIAGNOSIS — M25511 Pain in right shoulder: Secondary | ICD-10-CM | POA: Diagnosis present

## 2015-07-07 MED ORDER — GADOBENATE DIMEGLUMINE 529 MG/ML IV SOLN
0.1000 mL | Freq: Once | INTRAVENOUS | Status: DC | PRN
  Administered 2015-07-07: 0.1 mL via INTRA_ARTICULAR
  Filled 2015-07-07: qty 5

## 2015-07-07 MED ORDER — IOHEXOL 180 MG/ML  SOLN
10.0000 mL | Freq: Once | INTRAMUSCULAR | Status: DC | PRN
  Administered 2015-07-07: 10 mL via INTRA_ARTICULAR
  Filled 2015-07-07: qty 20

## 2015-08-09 ENCOUNTER — Other Ambulatory Visit: Payer: Self-pay | Admitting: *Deleted

## 2015-08-09 ENCOUNTER — Ambulatory Visit (INDEPENDENT_AMBULATORY_CARE_PROVIDER_SITE_OTHER): Payer: Medicare Other | Admitting: *Deleted

## 2015-08-09 DIAGNOSIS — J449 Chronic obstructive pulmonary disease, unspecified: Secondary | ICD-10-CM

## 2015-08-09 DIAGNOSIS — R0600 Dyspnea, unspecified: Secondary | ICD-10-CM

## 2015-08-09 LAB — PULMONARY FUNCTION TEST
DL/VA % PRED: 67 %
DL/VA: 3.25 ml/min/mmHg/L
DLCO UNC: 15.45 ml/min/mmHg
DLCO unc % pred: 63 %
FEF 25-75 POST: 1.95 L/s
FEF 25-75 Pre: 1.48 L/sec
FEF2575-%CHANGE-POST: 31 %
FEF2575-%Pred-Post: 100 %
FEF2575-%Pred-Pre: 76 %
FEV1-%CHANGE-POST: 8 %
FEV1-%PRED-PRE: 75 %
FEV1-%Pred-Post: 82 %
FEV1-POST: 1.88 L
FEV1-PRE: 1.72 L
FEV1FVC-%CHANGE-POST: 0 %
FEV1FVC-%PRED-PRE: 100 %
FEV6-%Change-Post: 8 %
FEV6-%PRED-POST: 84 %
FEV6-%Pred-Pre: 77 %
FEV6-PRE: 2.24 L
FEV6-Post: 2.44 L
FEV6FVC-%Change-Post: 0 %
FEV6FVC-%PRED-PRE: 104 %
FEV6FVC-%Pred-Post: 105 %
FVC-%CHANGE-POST: 8 %
FVC-%PRED-POST: 80 %
FVC-%PRED-PRE: 74 %
FVC-POST: 2.44 L
FVC-PRE: 2.25 L
POST FEV1/FVC RATIO: 77 %
Post FEV6/FVC ratio: 100 %
Pre FEV1/FVC ratio: 77 %
Pre FEV6/FVC Ratio: 100 %
RV % PRED: 109 %
RV: 2.39 L
TLC % pred: 100 %
TLC: 5.08 L

## 2015-08-09 NOTE — Progress Notes (Signed)
SMW performed today. 

## 2015-08-09 NOTE — Progress Notes (Signed)
PFT performed today. 

## 2015-08-11 ENCOUNTER — Ambulatory Visit (INDEPENDENT_AMBULATORY_CARE_PROVIDER_SITE_OTHER): Payer: Medicare Other | Admitting: Internal Medicine

## 2015-08-11 ENCOUNTER — Encounter: Payer: Self-pay | Admitting: Internal Medicine

## 2015-08-11 ENCOUNTER — Telehealth: Payer: Self-pay | Admitting: *Deleted

## 2015-08-11 VITALS — BP 130/76 | HR 87 | Ht 64.0 in | Wt 217.0 lb

## 2015-08-11 DIAGNOSIS — Z9989 Dependence on other enabling machines and devices: Secondary | ICD-10-CM

## 2015-08-11 DIAGNOSIS — J449 Chronic obstructive pulmonary disease, unspecified: Secondary | ICD-10-CM | POA: Diagnosis not present

## 2015-08-11 DIAGNOSIS — G4733 Obstructive sleep apnea (adult) (pediatric): Secondary | ICD-10-CM | POA: Diagnosis not present

## 2015-08-11 MED ORDER — UMECLIDINIUM-VILANTEROL 62.5-25 MCG/INH IN AEPB: 1.0000 | INHALATION_SPRAY | Freq: Every day | RESPIRATORY_TRACT | Status: AC

## 2015-08-11 MED ORDER — UMECLIDINIUM-VILANTEROL 62.5-25 MCG/INH IN AEPB: 1.0000 | INHALATION_SPRAY | Freq: Every day | RESPIRATORY_TRACT | Status: DC

## 2015-08-11 NOTE — Progress Notes (Signed)
[facility] Consultation      MRN# [id number] Tammy Hatfield 21-Oct-1945   CC: Chief Complaint  Patient presents with  . Follow-up    PFT & SMW results. no new concerns.      Brief History: Synopsis - 69 yo female with COPD Stage II, managed well with Symbicort.  Seen as transition of care from [facility].    Events since last clinic visit: Patient seen today for follow-up visit of COPD, stage II. Since her last visit she's had right shoulder surgery, gated by prolonged recovery course. Today she states she is doing well, she states she is currently on Spiriva and using it as a as needed medication. She's also stated that her insurance is not covering Spiriva completely, and the medication is causing her $180 per month Today patient states with rapid weather changes she has allergies and her sinuses flareup causing postnasal drip and a mild cough. Overall she states that she is doing well, also she had a pulmonary function tests and 6 minute walk test, and would like to go over the results of these today.    Medication:   Current Outpatient Rx  Name  Route  Sig  Dispense  Refill  . albuterol (PROVENTIL HFA;VENTOLIN HFA) 108 (90 BASE) MCG/ACT inhaler   Inhalation   Inhale 2 puffs into the lungs every 4 (four) hours as needed for wheezing or shortness of breath.         . ALPRAZolam (XANAX) 0.5 MG tablet   Oral   Take 0.5 mg by mouth 2 (two) times daily as needed for anxiety.         Marland Kitchen aspirin EC 81 MG tablet   Oral   Take by mouth.         . furosemide (LASIX) 40 MG tablet   Oral   Take 1 tablet (40 mg total) by mouth daily.   30 tablet   1   . glipiZIDE (GLUCOTROL) 10 MG tablet   Oral   Take 10 mg by mouth daily before breakfast.         . insulin NPH Human (HUMULIN N,NOVOLIN N) 100 UNIT/ML injection   Subcutaneous   Inject into the skin. 60 units in AM 40 units in PM         . loratadine (CLARITIN) 10 MG tablet   Oral   Take 10 mg by mouth daily as needed.          Marland Kitchen losartan (COZAAR) 50 MG tablet   Oral   Take 50 mg by mouth every morning.          Marland Kitchen omeprazole (PRILOSEC) 20 MG capsule   Oral   Take 40 mg by mouth every morning.          Marland Kitchen PARoxetine (PAXIL) 40 MG tablet   Oral   Take 40 mg by mouth every morning.         . tiotropium (SPIRIVA) 18 MCG inhalation capsule   Inhalation   Place 18 mcg into inhaler and inhale daily as needed.         . traMADol (ULTRAM) 50 MG tablet   Oral   Take 50 mg by mouth every 6 (six) hours as needed for moderate pain.          Marland Kitchen Umeclidinium-Vilanterol (ANORO ELLIPTA) 62.5-25 MCG/INH AEPB   Inhalation   Inhale 1 puff into the lungs daily.   7 each   0   .  Umeclidinium-Vilanterol 62.5-25 MCG/INH AEPB   Inhalation   Inhale 1 puff into the lungs daily.   60 each   0      Review of Systems  Constitutional: Negative for fever, chills, weight loss, malaise/fatigue and diaphoresis.  HENT: Negative for congestion, hearing loss and nosebleeds.   Eyes: Negative for blurred vision and photophobia.  Respiratory: Positive for cough.        Mild dry cough  Cardiovascular: Negative for chest pain.  Gastrointestinal: Negative for heartburn and nausea.  Genitourinary: Negative for dysuria.  Musculoskeletal: Negative for myalgias.  Skin: Negative for itching and rash.  Neurological: Negative for weakness and headaches.  Endo/Heme/Allergies: Does not bruise/bleed easily.  Psychiatric/Behavioral: Negative for depression.      Allergies:  Metformin and related; Amoxicillin; Avelox; Benazepril; Byetta 10 mcg pen; Metformin; Neurontin; Statins; Sulfa antibiotics; and Ceclor  Physical Examination:  VS: BP 130/76 mmHg  Pulse 87  Ht '5\' 4"'$  (1.626 m)  Wt 217 lb (98.431 kg)  BMI 37.23 kg/m2  SpO2 95%  General Appearance: No distress  HEENT: PERRLA, no ptosis, no other lesions noticed Pulmonary:normal breath sounds., diaphragmatic excursion  normal.No wheezing, No rales   Cardiovascular:  Normal S1,S2.  No m/r/g.     Abdomen:Exam: Benign, Soft, non-tender, No masses  Skin:   warm, no rashes, no ecchymosis  Extremities: normal, no cyanosis, clubbing, warm with normal capillary refill.    PFTs unchanged - mild obstruction. FEV1 75%, FEV1/FVC 77, DLCO 63, no significant bronchodilator response. Similar finding to PFTs in 05/2014 6MWT - no desats, walked 826 feet, no complaints of SOB  Assesment and Plan: OSA on CPAP Followed by Dr. Raul Del, currently on CPAP, currently compliant. Advised patient to continue with good compliance, using CPAP 7 nights per week for a minimum of 6 hours per night.  Plan: -Continue OSA management with Dr. Vella Kohler.    COPD (chronic obstructive pulmonary disease) ([place]) History of stage II COPD Former smoker Well-controlled at this time Patient was using spiriva on a PRN basis. Reeducated on the use of a controller med.  Stopped Spiriva due to high cost, and started her on Incruse.   PFTs unchanged - mild obstruction. FEV1 75%, FEV1/FVC 77, DLCO 63, no significant bronchodilator response. Similar finding to PFTs in 05/2014 6MWT - no desats, walked 826 feet, no complaints of SOB  Plan: - stop spiriva, start Incruse - 1 puff daily, -gargle and rinse after each use.  -Continue to avoid tobacco smoke -Exercise, diet, weight loss      Updated Medication List Outpatient Encounter Prescriptions as of 08/11/2015  Medication Sig  . albuterol (PROVENTIL HFA;VENTOLIN HFA) 108 (90 BASE) MCG/ACT inhaler Inhale 2 puffs into the lungs every 4 (four) hours as needed for wheezing or shortness of breath.  . ALPRAZolam (XANAX) 0.5 MG tablet Take 0.5 mg by mouth 2 (two) times daily as needed for anxiety.  Marland Kitchen aspirin EC 81 MG tablet Take by mouth.  . furosemide (LASIX) 40 MG tablet Take 1 tablet (40 mg total) by mouth daily.  Marland Kitchen glipiZIDE (GLUCOTROL) 10 MG tablet Take 10 mg by mouth daily before breakfast.  .  insulin NPH Human (HUMULIN N,NOVOLIN N) 100 UNIT/ML injection Inject into the skin. 60 units in AM 40 units in PM  . loratadine (CLARITIN) 10 MG tablet Take 10 mg by mouth daily as needed.   Marland Kitchen losartan (COZAAR) 50 MG tablet Take 50 mg by mouth every morning.   Marland Kitchen omeprazole (PRILOSEC) 20 MG capsule Take 40  mg by mouth every morning.   Marland Kitchen PARoxetine (PAXIL) 40 MG tablet Take 40 mg by mouth every morning.  . tiotropium (SPIRIVA) 18 MCG inhalation capsule Place 18 mcg into inhaler and inhale daily as needed.  . traMADol (ULTRAM) 50 MG tablet Take 50 mg by mouth every 6 (six) hours as needed for moderate pain.   . [DISCONTINUED] budesonide-formoterol (SYMBICORT) 160-4.5 MCG/ACT inhaler Inhale 2 puffs into the lungs 2 (two) times daily as needed (for shortness of breath).   . Umeclidinium-Vilanterol (ANORO ELLIPTA) 62.5-25 MCG/INH AEPB Inhale 1 puff into the lungs daily.  Marland Kitchen Umeclidinium-Vilanterol 62.5-25 MCG/INH AEPB Inhale 1 puff into the lungs daily.   No facility-administered encounter medications on file as of 08/11/2015.    Orders for this visit: No orders of the defined types were placed in this encounter.    Thank  you for the visitation and for allowing  [facility] to assist in the care of your patient. Our recommendations are noted above.  Please contact us if we can be of further service.  Vilinda Boehringer, MD [facility] Office Number: [phone]  Note: This note was prepared with Dragon dictation along with smaller phrase technology. Any transcriptional errors that result from this process are unintentional.

## 2015-08-11 NOTE — Telephone Encounter (Signed)
Pt misunderstood. She is to call back when she finds out if her insurance will cover the Anoro. Pt states she will call back she she finds out. Nothing further needed.

## 2015-08-11 NOTE — Telephone Encounter (Signed)
Pt calling just seen here and was told to give [place] a call back when she called the company for free samples  [employer] is the medication company she called.

## 2015-08-11 NOTE — Assessment & Plan Note (Signed)
Followed by Dr. Raul Del, currently on CPAP, currently compliant. Advised patient to continue with good compliance, using CPAP 7 nights per week for a minimum of 6 hours per night.  Plan: -Continue OSA management with Dr. Vella Kohler.

## 2015-08-11 NOTE — Assessment & Plan Note (Signed)
History of stage II COPD Former smoker Well-controlled at this time Patient was using spiriva on a PRN basis. Reeducated on the use of a controller med.  Stopped Spiriva due to high cost, and started her on Incruse.   PFTs unchanged - mild obstruction. FEV1 75%, FEV1/FVC 77, DLCO 63, no significant bronchodilator response. Similar finding to PFTs in 05/2014 6MWT - no desats, walked 826 feet, no complaints of SOB  Plan: - stop spiriva, start Incruse - 1 puff daily, -gargle and rinse after each use.  -Continue to avoid tobacco smoke -Exercise, diet, weight loss

## 2015-08-11 NOTE — Patient Instructions (Signed)
Follow up with Dr. Stevenson Clinch in 3 months - Stop Spiriva due to high cost, start Incruse - 1 puff daily, -gargle and rinse after each use.  - albuterol as needed - avoid noxious substance - tobacco, perfumes - cont with current allergy regiment.

## 2015-08-18 ENCOUNTER — Telehealth: Payer: Self-pay | Admitting: *Deleted

## 2015-08-18 NOTE — Telephone Encounter (Signed)
Initiated PA for [employer] thru [employer]. Pending review. [id number].

## 2015-08-22 NOTE — Telephone Encounter (Signed)
Anoro has been denied. Insurance states pt must try and fail Serevent and Stioloto. VM please advise.

## 2015-08-22 NOTE — Telephone Encounter (Signed)
This is a [place] patient. Have you received a fax on approval/denial Misty? thanks

## 2015-08-23 NOTE — Telephone Encounter (Signed)
That is okay with me. We can try ins approved drugs first.   Dr. Stevenson Clinch

## 2015-08-24 NOTE — Telephone Encounter (Signed)
OK, please advise on which one to try- stiolto and serevent both are covered, thanks!

## 2015-08-24 NOTE — Telephone Encounter (Signed)
Lets try stiolto. Stiolto - 2 puffs qam, gargle and rinse after each use.   Thank you.

## 2015-08-25 MED ORDER — TIOTROPIUM BROMIDE-OLODATEROL 2.5-2.5 MCG/ACT IN AERS: 2.0000 | INHALATION_SPRAY | Freq: Every day | RESPIRATORY_TRACT | Status: DC

## 2015-08-25 NOTE — Telephone Encounter (Signed)
Spoke with pt and is aware of recs below. RX has been sent into [facility]. Nothing further needed

## 2015-10-20 DIAGNOSIS — M25562 Pain in left knee: Secondary | ICD-10-CM | POA: Insufficient documentation

## 2015-11-10 DIAGNOSIS — M7711 Lateral epicondylitis, right elbow: Secondary | ICD-10-CM | POA: Insufficient documentation

## 2015-11-29 ENCOUNTER — Ambulatory Visit (INDEPENDENT_AMBULATORY_CARE_PROVIDER_SITE_OTHER): Payer: Medicare Other | Admitting: Internal Medicine

## 2015-11-29 ENCOUNTER — Encounter: Payer: Self-pay | Admitting: Internal Medicine

## 2015-11-29 VITALS — BP 136/72 | HR 91 | Ht 64.0 in | Wt 221.4 lb

## 2015-11-29 DIAGNOSIS — J449 Chronic obstructive pulmonary disease, unspecified: Secondary | ICD-10-CM

## 2015-11-29 MED ORDER — TIOTROPIUM BROMIDE-OLODATEROL 2.5-2.5 MCG/ACT IN AERS: 2.0000 | INHALATION_SPRAY | Freq: Every day | RESPIRATORY_TRACT | Status: AC

## 2015-11-29 NOTE — Assessment & Plan Note (Signed)
History of stage II COPD Former smoker Well-controlled at this time Tried Incruse, got good benefit, but could not afford, swtiched to [employer] due to ins request, still difficult for pt to afford, samples given today.   PFTs 07/2015- mild obstruction. FEV1 75%, FEV1/FVC 77, DLCO 63, no significant bronchodilator response. Similar finding to PFTs in 05/2014 6MWT - no desats, walked 826 feet, no complaints of SOB  Plan: - Stiolto 2 puffs daily, gargle and rinse after each use.  -Continue to avoid tobacco smoke -Exercise, diet, weight loss

## 2015-11-29 NOTE — Patient Instructions (Signed)
Follow up with Dr. Stevenson Clinch in:24month - Stiolto 2 puff daily for COPD - cont with diet and exercise - cont to avoid any forms of tobacco - cont with OTC allergy meds

## 2015-11-29 NOTE — Addendum Note (Signed)
Addended by: Oscar La R on: 11/29/2015 11:38 AM   Modules accepted: Orders

## 2015-11-29 NOTE — Progress Notes (Signed)
[facility] Consultation      MRN# [id number] Tammy Hatfield [birth year]   CC: Chief Complaint  Patient presents with  . Follow-up    pt. states breathing has improved. c/o occ SOB, dry cough. denies wheezing or chest pain/tightness.       Brief History: Synopsis - 70 yo female with COPD Stage II.  Seen as transition of care from [facility].    Events since last clinic visit: Patient seen today for follow-up visit of COPD, stage II. After her last visit she was started on Incruse, but it was not approved by insurance, and she was switched to [employer], which is still costly for her.  She has been using stiolto 3-4 times per week, and is doing fairly well.  Patient also attends gym 3-4 times per week. . Today has mild intermittent sob and dry cough, mainly due to allergies. No wheezing, chest tightness.    Medication:   Current Outpatient Rx  Name  Route  Sig  Dispense  Refill  . albuterol (PROVENTIL HFA;VENTOLIN HFA) 108 (90 BASE) MCG/ACT inhaler   Inhalation   Inhale 2 puffs into the lungs every 4 (four) hours as needed for wheezing or shortness of breath.         . ALPRAZolam (XANAX) 0.5 MG tablet   Oral   Take 0.5 mg by mouth 2 (two) times daily as needed for anxiety.         . furosemide (LASIX) 40 MG tablet   Oral   Take 1 tablet (40 mg total) by mouth daily. Patient taking differently: Take 40 mg by mouth as needed.    30 tablet   1   . glipiZIDE (GLUCOTROL) 10 MG tablet   Oral   Take 10 mg by mouth daily before breakfast.         . insulin NPH Human (HUMULIN N,NOVOLIN N) 100 UNIT/ML injection   Subcutaneous   Inject into the skin. 60 units in AM 40 units in PM         . loratadine (CLARITIN) 10 MG tablet   Oral   Take 10 mg by mouth daily as needed.          Marland Kitchen losartan (COZAAR) 50 MG tablet   Oral   Take 50 mg by mouth every morning.          Marland Kitchen omeprazole (PRILOSEC) 20 MG capsule   Oral   Take 20 mg by mouth 2  (two) times daily.          Marland Kitchen PARoxetine (PAXIL) 40 MG tablet   Oral   Take 40 mg by mouth every morning.         . tiotropium (SPIRIVA) 18 MCG inhalation capsule   Inhalation   Place 18 mcg into inhaler and inhale daily as needed.         . Tiotropium Bromide-Olodaterol (STIOLTO RESPIMAT) 2.5-2.5 MCG/ACT AERS   Inhalation   Inhale 2 puffs into the lungs daily.   4 g   4   . traMADol (ULTRAM) 50 MG tablet   Oral   Take 50 mg by mouth every 6 (six) hours as needed for moderate pain.          Marland Kitchen Umeclidinium-Vilanterol 62.5-25 MCG/INH AEPB   Inhalation   Inhale 1 puff into the lungs daily.   60 each   0      Review of Systems  Constitutional: Negative for fever, chills, weight loss, malaise/fatigue and  diaphoresis.  HENT: Negative for congestion, hearing loss and nosebleeds.   Eyes: Negative for blurred vision and photophobia.  Respiratory: Positive for cough. Negative for hemoptysis and sputum production.        Mild dry cough  Cardiovascular: Negative for chest pain.  Gastrointestinal: Negative for heartburn and nausea.  Genitourinary: Negative for dysuria.  Musculoskeletal: Negative for myalgias.  Skin: Negative for itching and rash.  Neurological: Negative for weakness and headaches.  Endo/Heme/Allergies: Does not bruise/bleed easily.  Psychiatric/Behavioral: Negative for depression.      Allergies:  Metformin and related; Amoxicillin; Avelox; Benazepril; Byetta 10 mcg pen; Metformin; Neurontin; Statins; Sulfa antibiotics; and Ceclor  Physical Examination:  VS: BP 136/72 mmHg  Pulse 91  Ht '5\' 4"'$  (1.626 m)  Wt 221 lb 6.4 oz (100.426 kg)  BMI 37.98 kg/m2  SpO2 93%  General Appearance: No distress  HEENT: PERRLA, no ptosis, no other lesions noticed Pulmonary:normal breath sounds., diaphragmatic excursion normal.No wheezing, No rales   Cardiovascular:  Normal S1,S2.  No m/r/g.     Abdomen:Exam: Benign, Soft, non-tender, No masses  Skin:   warm, no  rashes, no ecchymosis  Extremities: normal, no cyanosis, clubbing, warm with normal capillary refill.    PFTs - mild obstruction. FEV1 75%, FEV1/FVC 77, DLCO 63, no significant bronchodilator response. Similar finding to PFTs in 05/2014 6MWT - no desats, walked 826 feet, no complaints of SOB  Assesment and Plan: COPD (chronic obstructive pulmonary disease) ([place]) History of stage II COPD Former smoker Well-controlled at this time Tried Incruse, got good benefit, but could not afford, swtiched to [employer] due to ins request, still difficult for pt to afford, samples given today.   PFTs 07/2015- mild obstruction. FEV1 75%, FEV1/FVC 77, DLCO 63, no significant bronchodilator response. Similar finding to PFTs in 05/2014 6MWT - no desats, walked 826 feet, no complaints of SOB  Plan: - Stiolto 2 puffs daily, gargle and rinse after each use.  -Continue to avoid tobacco smoke -Exercise, diet, weight loss        Updated Medication List Outpatient Encounter Prescriptions as of 11/29/2015  Medication Sig  . albuterol (PROVENTIL HFA;VENTOLIN HFA) 108 (90 BASE) MCG/ACT inhaler Inhale 2 puffs into the lungs every 4 (four) hours as needed for wheezing or shortness of breath.  . ALPRAZolam (XANAX) 0.5 MG tablet Take 0.5 mg by mouth 2 (two) times daily as needed for anxiety.  . furosemide (LASIX) 40 MG tablet Take 1 tablet (40 mg total) by mouth daily. (Patient taking differently: Take 40 mg by mouth as needed. )  . glipiZIDE (GLUCOTROL) 10 MG tablet Take 10 mg by mouth daily before breakfast.  . insulin NPH Human (HUMULIN N,NOVOLIN N) 100 UNIT/ML injection Inject into the skin. 60 units in AM 40 units in PM  . loratadine (CLARITIN) 10 MG tablet Take 10 mg by mouth daily as needed.   Marland Kitchen losartan (COZAAR) 50 MG tablet Take 50 mg by mouth every morning.   Marland Kitchen omeprazole (PRILOSEC) 20 MG capsule Take 20 mg by mouth 2 (two) times daily.   Marland Kitchen PARoxetine (PAXIL) 40 MG tablet Take 40 mg by mouth every morning.   . tiotropium (SPIRIVA) 18 MCG inhalation capsule Place 18 mcg into inhaler and inhale daily as needed.  . Tiotropium Bromide-Olodaterol (STIOLTO RESPIMAT) 2.5-2.5 MCG/ACT AERS Inhale 2 puffs into the lungs daily.  . traMADol (ULTRAM) 50 MG tablet Take 50 mg by mouth every 6 (six) hours as needed for moderate pain.   Marland Kitchen Umeclidinium-Vilanterol 62.5-25 MCG/INH  AEPB Inhale 1 puff into the lungs daily.  . [DISCONTINUED] aspirin EC 81 MG tablet Take by mouth. Reported on 11/29/2015   No facility-administered encounter medications on file as of 11/29/2015.    Orders for this visit: No orders of the defined types were placed in this encounter.    Thank  you for the visitation and for allowing  [facility] to assist in the care of your patient. Our recommendations are noted above.  Please contact us if we can be of further service.  Vilinda Boehringer, MD [facility] Office Number: [phone]  Note: This note was prepared with Dragon dictation along with smaller phrase technology. Any transcriptional errors that result from this process are unintentional.

## 2015-12-26 ENCOUNTER — Ambulatory Visit: Payer: Medicare Other | Admitting: Oncology

## 2015-12-29 ENCOUNTER — Inpatient Hospital Stay: Payer: Medicare Other | Attending: Oncology | Admitting: Oncology

## 2015-12-29 VITALS — BP 119/68 | HR 92 | Resp 20 | Wt 218.7 lb

## 2015-12-29 DIAGNOSIS — Z87891 Personal history of nicotine dependence: Secondary | ICD-10-CM | POA: Insufficient documentation

## 2015-12-29 DIAGNOSIS — K219 Gastro-esophageal reflux disease without esophagitis: Secondary | ICD-10-CM | POA: Diagnosis not present

## 2015-12-29 DIAGNOSIS — Z923 Personal history of irradiation: Secondary | ICD-10-CM

## 2015-12-29 DIAGNOSIS — E7800 Pure hypercholesterolemia, unspecified: Secondary | ICD-10-CM | POA: Diagnosis not present

## 2015-12-29 DIAGNOSIS — I10 Essential (primary) hypertension: Secondary | ICD-10-CM

## 2015-12-29 DIAGNOSIS — M170 Bilateral primary osteoarthritis of knee: Secondary | ICD-10-CM | POA: Insufficient documentation

## 2015-12-29 DIAGNOSIS — Z853 Personal history of malignant neoplasm of breast: Secondary | ICD-10-CM | POA: Diagnosis present

## 2015-12-29 DIAGNOSIS — D649 Anemia, unspecified: Secondary | ICD-10-CM | POA: Diagnosis not present

## 2015-12-29 DIAGNOSIS — Z794 Long term (current) use of insulin: Secondary | ICD-10-CM | POA: Diagnosis not present

## 2015-12-29 DIAGNOSIS — E119 Type 2 diabetes mellitus without complications: Secondary | ICD-10-CM | POA: Insufficient documentation

## 2015-12-29 DIAGNOSIS — C50919 Malignant neoplasm of unspecified site of unspecified female breast: Secondary | ICD-10-CM

## 2015-12-29 DIAGNOSIS — J449 Chronic obstructive pulmonary disease, unspecified: Secondary | ICD-10-CM | POA: Insufficient documentation

## 2015-12-29 DIAGNOSIS — Z9223 Personal history of estrogen therapy: Secondary | ICD-10-CM | POA: Insufficient documentation

## 2015-12-29 NOTE — Progress Notes (Signed)
Patient does not offer any problems today.  

## 2016-01-01 NOTE — Progress Notes (Signed)
[place]  Telephone:(336) [phone] Fax:(336) [phone]  ID: Tammy Hatfield OB: Nov 08, 1945  MR#: [id number]  SPQ#:[id number]  Patient Care Team: Leonel Ramsay, MD as PCP - General (Infectious Diseases)  CHIEF COMPLAINT:  Chief Complaint  Patient presents with  . Breast Cancer    INTERVAL HISTORY: Patient returns to clinic today for routine 6 month evaluation. Patient continues to refuse adjuvant therapy with an aromatase inhibitor. She currently feels well and is asymptomatic.  She denies any neurologic symptoms.  She has a good appetite.  She denies any chest pain or shortness of breath.  She denies any nausea, vomiting, constipation, or diarrhea.  She has no urinary complaints.  Patient offers no specific complaints today.   REVIEW OF SYSTEMS:   Review of Systems  Constitutional: Negative.  Negative for fever, weight loss and malaise/fatigue.  Respiratory: Negative.  Negative for sputum production.   Cardiovascular: Negative.  Negative for chest pain.  Gastrointestinal: Negative.   Genitourinary: Negative.   Musculoskeletal: Negative.   Neurological: Negative.  Negative for weakness.  Psychiatric/Behavioral: Negative.     As per HPI. Otherwise, a complete review of systems is negatve.  PAST MEDICAL HISTORY: Past Medical History  Diagnosis Date  . Ulcer, stomach peptic     history of  . Diabetes mellitus without complication ([place])   . Hypercholesteremia   . Esophageal reflux   . Hyperlipidemia   . Hypertension   . Arthritis     knees  . Degenerative lumbar disc   . Barrett's esophagus   . History of Helicobacter pylori infection   . Dysphagia   . COPD (chronic obstructive pulmonary disease) ([place]) 2012  . Cancer [facility]) 2014    right breast - s\p Radiation and lumpectomy and LN dissection  . Chronic sinusitis   . Breast cancer ([place]) 2014    RT LUMPECTOMY  . Radiation 2014    RT BREAST CA    PAST SURGICAL HISTORY: Past Surgical  History  Procedure Laterality Date  . Uvulopalatopharyngoplasty N/A 2001  . Abdominal hysterectomy    . Laparoscopic tubal ligation Bilateral 1967  . Umbilical hernia repair N/A   . Bunionectomy Bilateral 1980  . Breast lumpectomy Right 2013  . Shoulder arthroscopy with rotator cuff repair Right 02/09/2015    Procedure: SHOULDER ARTHROSCOPY WITH ROTATOR CUFF REPAIR,release long head biceps tendon,subacromial decompression.;  Surgeon: Leanor Kail, MD;  Location: [facility] ORS;  Service: Orthopedics;  Laterality: Right;  . Breast biopsy Right 1985    NEG    FAMILY HISTORY Family History  Problem Relation Age of Onset  . Breast cancer Neg Hx        ADVANCED DIRECTIVES:    HEALTH MAINTENANCE: Social History  Substance Use Topics  . Smoking status: Former Smoker    Quit date: 02/01/2000  . Smokeless tobacco: Never Used     Comment: former smoker 1ppd x 20 years  . Alcohol Use: No     Colonoscopy:  PAP:  Bone density:  Lipid panel:  Allergies  Allergen Reactions  . Metformin And Related Diarrhea  . Amoxicillin Other (See Comments)    Reaction: Yeast infection  . Avelox [Moxifloxacin] Other (See Comments)    Reaction: Muscle pain  . Benazepril Cough  . Byetta 10 Mcg Pen [Exenatide] Diarrhea  . Metformin Diarrhea  . Neurontin [Gabapentin] Other (See Comments)    Reaction: Mouth blister, joint pain and depression  . Statins Other (See Comments)    Reaction: Mouth blisters and joint pain  .  Sulfa Antibiotics Other (See Comments)    Reaction: Headache  . Ceclor [Cefaclor] Rash    Current Outpatient Prescriptions  Medication Sig Dispense Refill  . albuterol (PROVENTIL HFA;VENTOLIN HFA) 108 (90 BASE) MCG/ACT inhaler Inhale 2 puffs into the lungs every 4 (four) hours as needed for wheezing or shortness of breath.    . ALPRAZolam (XANAX) 0.5 MG tablet Take 0.5 mg by mouth 2 (two) times daily as needed for anxiety.    . furosemide (LASIX) 40 MG tablet Take 1 tablet (40 mg  total) by mouth daily. (Patient taking differently: Take 40 mg by mouth as needed. ) 30 tablet 1  . glipiZIDE (GLUCOTROL) 10 MG tablet Take 10 mg by mouth daily before breakfast.    . insulin NPH-regular Human (NOVOLIN 70/30) (70-30) 100 UNIT/ML injection     . loratadine (CLARITIN) 10 MG tablet Take 10 mg by mouth daily as needed.     Marland Kitchen losartan (COZAAR) 50 MG tablet Take 50 mg by mouth every morning.     Marland Kitchen omeprazole (PRILOSEC) 20 MG capsule Take 20 mg by mouth 2 (two) times daily.     Marland Kitchen PARoxetine (PAXIL) 40 MG tablet Take 40 mg by mouth every morning.    . tiotropium (SPIRIVA) 18 MCG inhalation capsule Place 18 mcg into inhaler and inhale daily as needed.    . Tiotropium Bromide-Olodaterol (STIOLTO RESPIMAT) 2.5-2.5 MCG/ACT AERS Inhale 2 puffs into the lungs daily. 4 g 4  . traMADol (ULTRAM) 50 MG tablet Take 50 mg by mouth every 6 (six) hours as needed for moderate pain.     Marland Kitchen Umeclidinium-Vilanterol 62.5-25 MCG/INH AEPB Inhale 1 puff into the lungs daily. 60 each 0   No current facility-administered medications for this visit.    OBJECTIVE: Filed Vitals:   12/29/15 1621  BP: 119/68  Pulse: 92  Resp: 20     Body mass index is 37.52 kg/(m^2).    ECOG FS:0 - Asymptomatic  General: Well-developed, well-nourished, no acute distress. Eyes: Pink conjunctiva, anicteric sclera. Breasts: Bilateral breasts and axilla without lumps or masses. Lungs: Clear to auscultation bilaterally. Heart: Regular rate and rhythm. No rubs, murmurs, or gallops. Abdomen: Soft, nontender, nondistended. No organomegaly noted, normoactive bowel sounds. Musculoskeletal: No edema, cyanosis, or clubbing. Neuro: Alert, answering all questions appropriately. Cranial nerves grossly intact. Skin: No rashes or petechiae noted. Psych: Normal affect.   LAB RESULTS:  Lab Results  Component Value Date   NA 140 02/15/2015   K 3.4* 02/15/2015   CL 96* 02/15/2015   CO2 36* 02/15/2015   GLUCOSE 229* 02/15/2015    BUN 13 02/15/2015   CREATININE 0.90 02/15/2015   CALCIUM 8.9 02/15/2015   PROT 6.7 02/15/2015   ALBUMIN 3.4* 02/15/2015   AST 47* 02/15/2015   ALT 310* 02/15/2015   ALKPHOS 109 02/15/2015   BILITOT 0.7 02/15/2015   GFRNONAA >60 02/15/2015   GFRAA >60 02/15/2015    Lab Results  Component Value Date   WBC 6.8 02/11/2015   NEUTROABS 9.5* 02/11/2015   HGB 11.2* 02/11/2015   HCT 34.3* 02/11/2015   MCV 91.0 02/11/2015   PLT 148* 02/11/2015     STUDIES: No results found.  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  PLAN:    1.  Breast cancer: Since patient's Oncotype DX was low risk, she did not require adjuvant chemotherapy.  She completed XRT in approximately January 2015. Patient could not tolerate letrozole, anastrozole, or  Aromasin. She elected to discontinue all aromatase inhibitors despite knowing her risk of recurrence increases. Her most recent mammogram in October 2016 was reported as BI-RADS 3, repeat in October 2017. Since patient is greater than 2 years removed from her XRT and is no longer taking adjuvant hormonal treatment, she can return to clinic in 1 year for further evaluation. 2. Anemia: Mild, monitor.    Patient expressed understanding and was in agreement with this plan. She also understands that She can call clinic at any time with any questions, concerns, or complaints.    Lloyd Huger, MD   01/01/2016 11:37 PM

## 2016-04-30 ENCOUNTER — Other Ambulatory Visit: Payer: Self-pay | Admitting: Nurse Practitioner

## 2016-04-30 DIAGNOSIS — R1032 Left lower quadrant pain: Secondary | ICD-10-CM

## 2016-05-02 ENCOUNTER — Ambulatory Visit
Admission: RE | Admit: 2016-05-02 | Discharge: 2016-05-02 | Disposition: A | Discharge status: Home / Self Care | Payer: Medicare Other | Source: Ambulatory Visit | Attending: Nurse Practitioner | Admitting: Nurse Practitioner

## 2016-05-02 DIAGNOSIS — I700 Atherosclerosis of aorta: Secondary | ICD-10-CM | POA: Diagnosis not present

## 2016-05-02 DIAGNOSIS — K5730 Diverticulosis of large intestine without perforation or abscess without bleeding: Secondary | ICD-10-CM | POA: Diagnosis not present

## 2016-05-02 DIAGNOSIS — R1032 Left lower quadrant pain: Secondary | ICD-10-CM

## 2016-05-02 DIAGNOSIS — K760 Fatty (change of) liver, not elsewhere classified: Secondary | ICD-10-CM | POA: Insufficient documentation

## 2016-05-02 LAB — POCT I-STAT CREATININE: Creatinine, Ser: 0.8 mg/dL (ref 0.44–1.00)

## 2016-05-02 MED ORDER — IOPAMIDOL (ISOVUE-300) INJECTION 61%
100.0000 mL | Freq: Once | INTRAVENOUS | Status: AC | PRN
  Administered 2016-05-02: 100 mL via INTRAVENOUS

## 2016-05-03 ENCOUNTER — Ambulatory Visit: Admission: RE | Admit: 2016-05-03 | Payer: Medicare Other | Source: Ambulatory Visit | Admitting: Nurse Practitioner

## 2016-06-26 ENCOUNTER — Other Ambulatory Visit
Admission: RE | Admit: 2016-06-26 | Discharge: 2016-06-26 | Disposition: A | Discharge status: Home / Self Care | Payer: Medicare Other | Source: Ambulatory Visit | Attending: Nurse Practitioner | Admitting: Nurse Practitioner

## 2016-06-26 DIAGNOSIS — R194 Change in bowel habit: Secondary | ICD-10-CM | POA: Insufficient documentation

## 2016-06-26 LAB — GASTROINTESTINAL PANEL BY PCR, STOOL (REPLACES STOOL CULTURE)

## 2016-06-26 LAB — C DIFFICILE QUICK SCREEN W PCR REFLEX
C Diff antigen: NEGATIVE
C Diff interpretation: NOT DETECTED
C Diff toxin: NEGATIVE

## 2016-07-03 DIAGNOSIS — K5730 Diverticulosis of large intestine without perforation or abscess without bleeding: Secondary | ICD-10-CM | POA: Insufficient documentation

## 2016-07-05 ENCOUNTER — Ambulatory Visit
Admission: RE | Admit: 2016-07-05 | Discharge: 2016-07-05 | Disposition: A | Discharge status: Home / Self Care | Payer: Medicare Other | Source: Ambulatory Visit | Attending: Oncology | Admitting: Oncology

## 2016-07-05 DIAGNOSIS — C50919 Malignant neoplasm of unspecified site of unspecified female breast: Secondary | ICD-10-CM

## 2016-11-13 DIAGNOSIS — R0981 Nasal congestion: Secondary | ICD-10-CM | POA: Insufficient documentation

## 2016-12-31 ENCOUNTER — Inpatient Hospital Stay: Payer: Medicare Other

## 2016-12-31 ENCOUNTER — Inpatient Hospital Stay: Payer: Medicare Other | Attending: Oncology | Admitting: Oncology

## 2016-12-31 DIAGNOSIS — Z794 Long term (current) use of insulin: Secondary | ICD-10-CM | POA: Insufficient documentation

## 2016-12-31 DIAGNOSIS — E119 Type 2 diabetes mellitus without complications: Secondary | ICD-10-CM | POA: Insufficient documentation

## 2016-12-31 DIAGNOSIS — Z170 Estrogen receptor positive status [ER+]: Secondary | ICD-10-CM | POA: Insufficient documentation

## 2016-12-31 DIAGNOSIS — Z923 Personal history of irradiation: Secondary | ICD-10-CM | POA: Diagnosis not present

## 2016-12-31 DIAGNOSIS — Z880 Allergy status to penicillin: Secondary | ICD-10-CM | POA: Diagnosis not present

## 2016-12-31 DIAGNOSIS — J449 Chronic obstructive pulmonary disease, unspecified: Secondary | ICD-10-CM | POA: Diagnosis not present

## 2016-12-31 DIAGNOSIS — E785 Hyperlipidemia, unspecified: Secondary | ICD-10-CM | POA: Diagnosis not present

## 2016-12-31 DIAGNOSIS — Z87891 Personal history of nicotine dependence: Secondary | ICD-10-CM | POA: Diagnosis not present

## 2016-12-31 DIAGNOSIS — K2270 Barrett's esophagus without dysplasia: Secondary | ICD-10-CM | POA: Insufficient documentation

## 2016-12-31 DIAGNOSIS — I10 Essential (primary) hypertension: Secondary | ICD-10-CM | POA: Diagnosis not present

## 2016-12-31 DIAGNOSIS — Z79899 Other long term (current) drug therapy: Secondary | ICD-10-CM | POA: Insufficient documentation

## 2016-12-31 DIAGNOSIS — M5136 Other intervertebral disc degeneration, lumbar region: Secondary | ICD-10-CM | POA: Diagnosis not present

## 2016-12-31 DIAGNOSIS — Z853 Personal history of malignant neoplasm of breast: Secondary | ICD-10-CM | POA: Diagnosis not present

## 2016-12-31 DIAGNOSIS — C50211 Malignant neoplasm of upper-inner quadrant of right female breast: Secondary | ICD-10-CM

## 2016-12-31 LAB — CBC WITH DIFFERENTIAL/PLATELET
Basophils Absolute: 0.1 10*3/uL (ref 0–0.1)
Basophils Relative: 1 %
EOS PCT: 2 %
Eosinophils Absolute: 0.2 10*3/uL (ref 0–0.7)
HCT: 38.2 % (ref 35.0–47.0)
HEMOGLOBIN: 13.3 g/dL (ref 12.0–16.0)
LYMPHS ABS: 2.2 10*3/uL (ref 1.0–3.6)
LYMPHS PCT: 30 %
MCH: 31 pg (ref 26.0–34.0)
MCHC: 34.8 g/dL (ref 32.0–36.0)
MCV: 89.1 fL (ref 80.0–100.0)
MONOS PCT: 6 %
Monocytes Absolute: 0.4 10*3/uL (ref 0.2–0.9)
NEUTROS PCT: 61 %
Neutro Abs: 4.4 10*3/uL (ref 1.4–6.5)
Platelets: 241 10*3/uL (ref 150–440)
RBC: 4.28 MIL/uL (ref 3.80–5.20)
RDW: 14.3 % (ref 11.5–14.5)
WBC: 7.3 10*3/uL (ref 3.6–11.0)

## 2016-12-31 NOTE — Progress Notes (Signed)
Complains of tenderness in right breast that is relieved with massage. Complains of easy bruising and bleeding.

## 2016-12-31 NOTE — Progress Notes (Signed)
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  INTERVAL HISTORY: Patient returns to clinic today for routine 6 month evaluation. Patient continues to refuse adjuvant therapy with an aromatase inhibitor. She currently feels well and is asymptomatic.  She denies any neurologic symptoms.  She has a good appetite.  She denies any chest pain or shortness of breath.  She denies any nausea, vomiting, constipation, or diarrhea.  She has no urinary complaints.  Patient offers no specific complaints today.   REVIEW OF SYSTEMS:   Review of Systems  Constitutional: Negative.  Negative for fever, malaise/fatigue and weight loss.  Respiratory: Negative.  Negative for sputum production.   Cardiovascular: Negative.  Negative for chest pain and leg swelling.  Gastrointestinal: Negative.  Negative for abdominal pain.  Genitourinary: Negative.   Musculoskeletal: Negative.   Skin: Negative.  Negative for rash.  Neurological: Negative.  Negative for sensory change and weakness.  Psychiatric/Behavioral: Negative.  The patient is not nervous/anxious.     As per HPI. Otherwise, a complete review of systems is negative.  PAST MEDICAL HISTORY: Past Medical History:  Diagnosis Date  . Arthritis    knees  . Barrett's esophagus   . Breast cancer ([place]) 2014   RT LUMPECTOMY  . Cancer [facility]) 2014   right breast - s\p Radiation and lumpectomy and LN dissection  . Chronic sinusitis   . COPD (chronic obstructive pulmonary disease) ([place]) 2012  . Degenerative lumbar disc   . Diabetes mellitus without complication ([place])   . Dysphagia   . Esophageal reflux   . History of Helicobacter pylori  infection   . Hypercholesteremia   . Hyperlipidemia   . Hypertension   . Radiation 2014   RT BREAST CA  . Ulcer, stomach peptic    history of    PAST SURGICAL HISTORY: Past Surgical History:  Procedure Laterality Date  . ABDOMINAL HYSTERECTOMY    . BREAST BIOPSY Right 1985   NEG  . BREAST LUMPECTOMY Right 2013  . BUNIONECTOMY Bilateral 1980  . LAPAROSCOPIC TUBAL LIGATION Bilateral 1967  . SHOULDER ARTHROSCOPY WITH ROTATOR CUFF REPAIR Right 02/09/2015   Procedure: SHOULDER ARTHROSCOPY WITH ROTATOR CUFF REPAIR,release long head biceps tendon,subacromial decompression.;  Surgeon: Leanor Kail, MD;  Location: [facility] ORS;  Service: Orthopedics;  Laterality: Right;  . UMBILICAL HERNIA REPAIR N/A   . UVULOPALATOPHARYNGOPLASTY N/A 2001    FAMILY HISTORY Family History  Problem Relation Age of Onset  . Breast cancer Neg Hx        ADVANCED DIRECTIVES:    HEALTH MAINTENANCE: Social History  Substance Use Topics  . Smoking status: Former Smoker    Quit date: 02/01/2000  . Smokeless tobacco: Never Used     Comment: former smoker 1ppd x 20 years  . Alcohol use No     Colonoscopy:  PAP:  Bone density:  Lipid panel:  Allergies  Allergen Reactions  . Metformin And Related Diarrhea  . Amoxicillin Other (See Comments)    Reaction: Yeast infection  . Avelox [Moxifloxacin] Other (See Comments)    Reaction: Muscle pain  . Benazepril Cough  . Bupropion     Other reaction(s): Dizziness, Headache  . Byetta 10 Mcg Pen [Exenatide] Diarrhea  . Metformin Diarrhea  .  Neurontin [Gabapentin] Other (See Comments)    Mouth blisters, joint pain, depression Reaction: Mouth blister, joint pain and depression  . Statins Other (See Comments)    Reaction: Mouth blisters and joint pain  . Sulfa Antibiotics Other (See Comments)    Reaction: Headache  . Ceclor [Cefaclor] Rash    Current Outpatient Prescriptions  Medication Sig Dispense Refill  . albuterol (PROVENTIL HFA;VENTOLIN HFA) 108  (90 BASE) MCG/ACT inhaler Inhale 2 puffs into the lungs every 4 (four) hours as needed for wheezing or shortness of breath.    . ALPRAZolam (XANAX) 0.5 MG tablet Take 0.5 mg by mouth 2 (two) times daily as needed for anxiety.    . furosemide (LASIX) 40 MG tablet Take 1 tablet (40 mg total) by mouth daily. (Patient taking differently: Take 40 mg by mouth as needed. ) 30 tablet 1  . glipiZIDE (GLUCOTROL) 10 MG tablet Take 10 mg by mouth daily before breakfast.    . insulin NPH-regular Human (NOVOLIN 70/30) (70-30) 100 UNIT/ML injection     . loratadine (CLARITIN) 10 MG tablet Take 10 mg by mouth daily as needed.     Marland Kitchen losartan (COZAAR) 50 MG tablet Take 50 mg by mouth every morning.     Marland Kitchen omeprazole (PRILOSEC) 20 MG capsule Take 20 mg by mouth 2 (two) times daily.     Marland Kitchen PARoxetine (PAXIL) 40 MG tablet Take 40 mg by mouth every morning.    . tiotropium (SPIRIVA) 18 MCG inhalation capsule Place 18 mcg into inhaler and inhale daily as needed.    . Tiotropium Bromide-Olodaterol (STIOLTO RESPIMAT) 2.5-2.5 MCG/ACT AERS Inhale 2 puffs into the lungs daily. 4 g 4  . traMADol (ULTRAM) 50 MG tablet Take 50 mg by mouth every 6 (six) hours as needed for moderate pain.     Marland Kitchen Umeclidinium-Vilanterol 62.5-25 MCG/INH AEPB Inhale 1 puff into the lungs daily. 60 each 0   No current facility-administered medications for this visit.     OBJECTIVE: Vitals:   12/31/16 1414  BP: 137/78  Pulse: 93  Resp: 18  Temp: 98.3 F (36.8 C)     Body mass index is 36.61 kg/m.    ECOG FS:0 - Asymptomatic  General: Well-developed, well-nourished, no acute distress. Eyes: Pink conjunctiva, anicteric sclera. Breasts: Bilateral breasts and axilla without lumps or masses. Lungs: Clear to auscultation bilaterally. Heart: Regular rate and rhythm. No rubs, murmurs, or gallops. Abdomen: Soft, nontender, nondistended. No organomegaly noted, normoactive bowel sounds. Musculoskeletal: No edema, cyanosis, or clubbing. Neuro: Alert,  answering all questions appropriately. Cranial nerves grossly intact. Skin: No rashes or petechiae noted. Psych: Normal affect.   LAB RESULTS:  Lab Results  Component Value Date   NA 140 02/15/2015   K 3.4 (L) 02/15/2015   CL 96 (L) 02/15/2015   CO2 36 (H) 02/15/2015   GLUCOSE 229 (H) 02/15/2015   BUN 13 02/15/2015   CREATININE 0.80 05/02/2016   CALCIUM 8.9 02/15/2015   PROT 6.7 02/15/2015   ALBUMIN 3.4 (L) 02/15/2015   AST 47 (H) 02/15/2015   ALT 310 (H) 02/15/2015   ALKPHOS 109 02/15/2015   BILITOT 0.7 02/15/2015   GFRNONAA >60 02/15/2015   GFRAA >60 02/15/2015    Lab Results  Component Value Date   WBC 7.3 12/31/2016   NEUTROABS 4.4 12/31/2016   HGB 13.3 12/31/2016   HCT 38.2 12/31/2016   MCV 89.1 12/31/2016   PLT 241 12/31/2016     STUDIES: No results found.  ASSESSMENT: Pathologic stage IA  ER/PR positive, HER-2 negative invasive carcinoma of the upper-inner quadrant of the right breast, Oncotype DX 13 which is considered low risk.  PLAN:    1. 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  She completed XRT in approximately January 2015. Patient could not tolerate letrozole, anastrozole, or Aromasin. She elected to discontinue all aromatase inhibitors despite knowing her risk of recurrence increases. Her most recent mammogram On July 05, 2016 was reported as BI-RADS 2, repeat in October 2018. Since patient is greater than 2 years removed from her XRT and is no longer taking adjuvant hormonal treatment, she can return to clinic in 1 year for further evaluation. 2. Anemia: Resolved.  Patient expressed understanding and was in agreement with this plan. She also understands that She can call clinic at any time with any questions, concerns, or complaints.    Lloyd Huger, MD   12/31/2016 2:20  PM

## 2017-01-01 LAB — CANCER ANTIGEN 27.29: CA 27.29: 7.4 U/mL (ref 0.0–38.6)

## 2017-01-02 ENCOUNTER — Other Ambulatory Visit: Payer: Self-pay | Admitting: Nurse Practitioner

## 2017-01-02 DIAGNOSIS — R519 Headache, unspecified: Secondary | ICD-10-CM

## 2017-01-02 DIAGNOSIS — R51 Headache: Secondary | ICD-10-CM

## 2017-01-02 DIAGNOSIS — M542 Cervicalgia: Secondary | ICD-10-CM

## 2017-01-02 DIAGNOSIS — R413 Other amnesia: Secondary | ICD-10-CM

## 2017-01-02 DIAGNOSIS — R42 Dizziness and giddiness: Secondary | ICD-10-CM

## 2017-01-04 ENCOUNTER — Ambulatory Visit
Admission: RE | Admit: 2017-01-04 | Discharge: 2017-01-04 | Disposition: A | Discharge status: Home / Self Care | Payer: Medicare Other | Source: Ambulatory Visit | Attending: Nurse Practitioner | Admitting: Nurse Practitioner

## 2017-01-04 DIAGNOSIS — R51 Headache: Secondary | ICD-10-CM | POA: Insufficient documentation

## 2017-01-04 DIAGNOSIS — M542 Cervicalgia: Secondary | ICD-10-CM | POA: Diagnosis not present

## 2017-01-04 DIAGNOSIS — E119 Type 2 diabetes mellitus without complications: Secondary | ICD-10-CM | POA: Insufficient documentation

## 2017-01-04 DIAGNOSIS — R519 Headache, unspecified: Secondary | ICD-10-CM

## 2017-01-04 DIAGNOSIS — R42 Dizziness and giddiness: Secondary | ICD-10-CM | POA: Diagnosis present

## 2017-01-04 DIAGNOSIS — R413 Other amnesia: Secondary | ICD-10-CM | POA: Diagnosis present

## 2017-01-04 LAB — POCT I-STAT CREATININE: CREATININE: 0.8 mg/dL (ref 0.44–1.00)

## 2017-01-04 MED ORDER — GADOBENATE DIMEGLUMINE 529 MG/ML IV SOLN
20.0000 mL | Freq: Once | INTRAVENOUS | Status: AC | PRN
  Administered 2017-01-04: 20 mL via INTRAVENOUS

## 2017-04-15 DIAGNOSIS — M1711 Unilateral primary osteoarthritis, right knee: Secondary | ICD-10-CM | POA: Insufficient documentation

## 2017-07-03 ENCOUNTER — Emergency Department: Payer: Medicare Other

## 2017-07-03 ENCOUNTER — Emergency Department
Admission: EM | Admit: 2017-07-03 | Discharge: 2017-07-03 | Disposition: A | Discharge status: Home / Self Care | Payer: Medicare Other | Attending: Emergency Medicine | Admitting: Emergency Medicine

## 2017-07-03 DIAGNOSIS — J449 Chronic obstructive pulmonary disease, unspecified: Secondary | ICD-10-CM | POA: Diagnosis not present

## 2017-07-03 DIAGNOSIS — I209 Angina pectoris, unspecified: Secondary | ICD-10-CM | POA: Insufficient documentation

## 2017-07-03 DIAGNOSIS — E119 Type 2 diabetes mellitus without complications: Secondary | ICD-10-CM | POA: Diagnosis not present

## 2017-07-03 DIAGNOSIS — Z87891 Personal history of nicotine dependence: Secondary | ICD-10-CM | POA: Insufficient documentation

## 2017-07-03 DIAGNOSIS — Z794 Long term (current) use of insulin: Secondary | ICD-10-CM | POA: Insufficient documentation

## 2017-07-03 DIAGNOSIS — Z79899 Other long term (current) drug therapy: Secondary | ICD-10-CM | POA: Diagnosis not present

## 2017-07-03 DIAGNOSIS — I110 Hypertensive heart disease with heart failure: Secondary | ICD-10-CM | POA: Diagnosis not present

## 2017-07-03 DIAGNOSIS — I509 Heart failure, unspecified: Secondary | ICD-10-CM | POA: Insufficient documentation

## 2017-07-03 DIAGNOSIS — R079 Chest pain, unspecified: Secondary | ICD-10-CM | POA: Insufficient documentation

## 2017-07-03 LAB — BASIC METABOLIC PANEL
Anion gap: 9 (ref 5–15)
BUN: 18 mg/dL (ref 6–20)
CHLORIDE: 103 mmol/L (ref 101–111)
CO2: 26 mmol/L (ref 22–32)
CREATININE: 1.08 mg/dL — AB (ref 0.44–1.00)
Calcium: 9.2 mg/dL (ref 8.9–10.3)
GFR calc non Af Amer: 50 mL/min — ABNORMAL LOW (ref >60)
GFR, EST AFRICAN AMERICAN: 58 mL/min — AB (ref >60)
Glucose, Bld: 165 mg/dL — ABNORMAL HIGH (ref 65–99)
Potassium: 4.5 mmol/L (ref 3.5–5.1)
Sodium: 138 mmol/L (ref 135–145)

## 2017-07-03 LAB — CBC
HEMATOCRIT: 38.4 % (ref 35.0–47.0)
HEMOGLOBIN: 12.8 g/dL (ref 12.0–16.0)
MCH: 30 pg (ref 26.0–34.0)
MCHC: 33.4 g/dL (ref 32.0–36.0)
MCV: 89.7 fL (ref 80.0–100.0)
PLATELETS: 200 10*3/uL (ref 150–440)
RBC: 4.28 MIL/uL (ref 3.80–5.20)
RDW: 15 % — ABNORMAL HIGH (ref 11.5–14.5)
WBC: 5.9 10*3/uL (ref 3.6–11.0)

## 2017-07-03 LAB — TROPONIN I
Troponin I: <0.03 ng/mL (ref <0.03)
Troponin I: 0.03 ng/mL (ref <0.03)

## 2017-07-03 MED ORDER — ISOSORBIDE MONONITRATE ER 30 MG PO TB24: 30.0000 mg | ORAL_TABLET | Freq: Every day | ORAL | 0 refills | Status: DC

## 2017-07-03 MED ORDER — ASPIRIN EC 81 MG PO TBEC: 81.0000 mg | DELAYED_RELEASE_TABLET | Freq: Every day | ORAL | 0 refills | Status: DC

## 2017-07-03 MED ORDER — ISOSORBIDE MONONITRATE ER 60 MG PO TB24
30.0000 mg | ORAL_TABLET | Freq: Every day | ORAL | Status: DC
  Administered 2017-07-03: 30 mg via ORAL
  Filled 2017-07-03: qty 1

## 2017-07-03 MED ORDER — ASPIRIN 81 MG PO CHEW
324.0000 mg | CHEWABLE_TABLET | Freq: Once | ORAL | Status: AC
  Administered 2017-07-03: 324 mg via ORAL
  Filled 2017-07-03: qty 4

## 2017-07-03 NOTE — Discharge Instructions (Signed)
Please follow-up with Dr. Humphrey Rolls tomorrow morning at 9:00 (07/04/17).  Return to the emergency department immediately for any chest pain, trouble breathing, or any other symptom personally concerning to yourself.

## 2017-07-03 NOTE — ED Triage Notes (Signed)
Pt was having stress test performed when she developed squeezing and burning chest pain that radiated up both sides of neck. Pt was given 1 tablet of SL nitroglycerin and pain resolved shortly thereafter. Pt in no pain at this time. Pt alert and oriented X4, active, cooperative, pt in NAD. RR even and unlabored, color WNL.

## 2017-07-03 NOTE — ED Notes (Signed)
Pt sent with EKG that was performed while having pain. Dr. Joni Fears reviewed.

## 2017-07-03 NOTE — ED Provider Notes (Signed)
[facility] Emergency Department Provider Note  Time seen: 4:46 PM  I have reviewed the triage vital signs and the nursing notes.   HISTORY  Chief Complaint Chest Pain    HPI Tammy Hatfield is a 71 y.o. female with a past medical history of COPD, diabetes, hypertension, hyperlipidemia, presents to the emergency department for chest pain/pressure.  According to the patient over the past several months she has been having increasing chest heaviness with ambulation/exertion.  States the heaviness will last minutes associated with shortness of breath oftentimes with nausea as well.  Denies diaphoresis.  Patient had a stress test today, states during the stress test she developed severe chest heaviness with radiation/burning into her neck with nausea, shortness of breath.  Patient was given 1 nitroglycerin tablet with resolution of her symptoms and was sent to the emergency department for further evaluation.  Here the patient denies any symptoms.  Denies any chest pain pressure, heaviness or shortness of breath.   Past Medical History:  Diagnosis Date  . Arthritis    knees  . Barrett's esophagus   . Breast cancer ([place]) 2014   RT LUMPECTOMY  . Cancer [facility]) 2014   right breast - s\p Radiation and lumpectomy and LN dissection  . Chronic sinusitis   . COPD (chronic obstructive pulmonary disease) ([place]) 2012  . Degenerative lumbar disc   . Diabetes mellitus without complication ([place])   . Dysphagia   . Esophageal reflux   . History of Helicobacter pylori infection   . Hypercholesteremia   . Hyperlipidemia   . Hypertension   . Radiation 2014   RT BREAST CA  . Ulcer, stomach peptic    history of    Patient Active Problem List   Diagnosis Date Noted  . Primary cancer of upper inner quadrant of right female breast ([place]) 12/31/2016  . CHF (congestive heart failure) ([place]) 02/11/2015  . OSA on CPAP 02/11/2015  . COPD (chronic obstructive pulmonary disease) ([place])  02/11/2015  . Former smoker 02/11/2015  . HTN (hypertension) 02/11/2015  . Diabetes ([place]) 02/11/2015  . Rotator cuff rupture 02/11/2015  . Hyperlipidemia 02/11/2015  . GERD (gastroesophageal reflux disease) 02/11/2015  . PUD (peptic ulcer disease) 02/11/2015  . Dysphagia 02/11/2015  . ARF (acute renal failure) ([place]) 02/11/2015    Past Surgical History:  Procedure Laterality Date  . ABDOMINAL HYSTERECTOMY    . BREAST BIOPSY Right 1985   NEG  . BREAST LUMPECTOMY Right 2013  . BUNIONECTOMY Bilateral 1980  . LAPAROSCOPIC TUBAL LIGATION Bilateral 1967  . SHOULDER ARTHROSCOPY WITH ROTATOR CUFF REPAIR Right 02/09/2015   Procedure: SHOULDER ARTHROSCOPY WITH ROTATOR CUFF REPAIR,release long head biceps tendon,subacromial decompression.;  Surgeon: Leanor Kail, MD;  Location: [facility] ORS;  Service: Orthopedics;  Laterality: Right;  . UMBILICAL HERNIA REPAIR N/A   . UVULOPALATOPHARYNGOPLASTY N/A 2001    Prior to Admission medications   Medication Sig Start Date End Date Taking? Authorizing Provider  albuterol (PROVENTIL HFA;VENTOLIN HFA) 108 (90 BASE) MCG/ACT inhaler Inhale 2 puffs into the lungs every 4 (four) hours as needed for wheezing or shortness of breath.    [provider]  ALPRAZolam Duanne Moron) 0.5 MG tablet Take 0.5 mg by mouth 2 (two) times daily as needed for anxiety.    [provider]  furosemide (LASIX) 40 MG tablet Take 1 tablet (40 mg total) by mouth daily. Patient taking differently: Take 40 mg by mouth as needed.  02/15/15   Henreitta Leber, MD  glipiZIDE ([place])  10 MG tablet Take 10 mg by mouth daily before breakfast.    [provider]  insulin NPH-regular Human (NOVOLIN 70/30) (70-30) 100 UNIT/ML injection  04/04/15   [provider]  loratadine (CLARITIN) 10 MG tablet Take 10 mg by mouth daily as needed.     [provider]  losartan (COZAAR) 50 MG tablet Take 50 mg by mouth every morning.     [provider]   omeprazole (PRILOSEC) 20 MG capsule Take 20 mg by mouth 2 (two) times daily.     [provider]  PARoxetine (PAXIL) 40 MG tablet Take 40 mg by mouth every morning.    [provider]  tiotropium (SPIRIVA) 18 MCG inhalation capsule Place 18 mcg into inhaler and inhale daily as needed.    [provider]  Tiotropium Bromide-Olodaterol (STIOLTO RESPIMAT) 2.5-2.5 MCG/ACT AERS Inhale 2 puffs into the lungs daily. 08/25/15   Mungal, Roxanne Mins, MD  traMADol (ULTRAM) 50 MG tablet Take 50 mg by mouth every 6 (six) hours as needed for moderate pain.     [provider]  Umeclidinium-Vilanterol 62.5-25 MCG/INH AEPB Inhale 1 puff into the lungs daily. 08/11/15   Vilinda Boehringer, MD    Allergies  Allergen Reactions  . Metformin And Related Diarrhea  . Amoxicillin Other (See Comments)    Reaction: Yeast infection  . Avelox [Moxifloxacin] Other (See Comments)    Reaction: Muscle pain  . Benazepril Cough  . Bupropion     Other reaction(s): Dizziness, Headache  . Byetta 10 Mcg Pen [Exenatide] Diarrhea  . Metformin Diarrhea  . Neurontin [Gabapentin] Other (See Comments)    Mouth blisters, joint pain, depression Reaction: Mouth blister, joint pain and depression  . Statins Other (See Comments)    Reaction: Mouth blisters and joint pain  . Sulfa Antibiotics Other (See Comments)    Reaction: Headache  . Ceclor [Cefaclor] Rash    Family History  Problem Relation Age of Onset  . Breast cancer Neg Hx     Social History Social History  Substance Use Topics  . Smoking status: Former Smoker    Quit date: 02/01/2000  . Smokeless tobacco: Never Used     Comment: former smoker 1ppd x 20 years  . Alcohol use No    Review of Systems Constitutional: Negative for fever. Cardiovascular: Chest pain with exertion. Respiratory: Shortness of breath with exertion Gastrointestinal: Negative for abdominal pain. Musculoskeletal: Negative for leg pain or swelling All other ROS  negative  ____________________________________________   PHYSICAL EXAM:  VITAL SIGNS: ED Triage Vitals  Enc Vitals Group     BP 07/03/17 1554 (!) 155/59     Pulse Rate 07/03/17 1554 79     Resp 07/03/17 1554 16     Temp 07/03/17 1554 98.6 F (37 C)     Temp Source 07/03/17 1554 Oral     SpO2 07/03/17 1554 94 %     Weight 07/03/17 1555 215 lb (97.5 kg)     Height 07/03/17 1555 5\' 4"  (1.626 m)     Head Circumference --      Peak Flow --      Pain Score --      Pain Loc --      Pain Edu? --      Excl. in [place]? --     Constitutional: Alert and oriented. Well appearing and in no distress. Eyes: Normal exam ENT   Head: Normocephalic and atraumatic   Mouth/Throat: Mucous membranes are moist. Cardiovascular: Normal  rate, regular rhythm.  Respiratory: Normal respiratory effort without tachypnea nor retractions. Breath sounds are clear  Gastrointestinal: Soft and nontender. No distention.  Musculoskeletal: Nontender with normal range of motion in all extremities. No lower extremity tenderness or edema. Neurologic:  Normal speech and language. No gross focal neurologic deficits Skin:  Skin is warm, dry and intact.  Psychiatric: Mood and affect are normal  ____________________________________________    EKG  EKG reviewed and interpreted by myself shows sinus rhythm at 80 bpm with occasional PVC, narrow QRS, normal axis, normal intervals, no concerning ST changes  ____________________________________________    RADIOLOGY  No acute abnormalities  ____________________________________________   INITIAL IMPRESSION / ASSESSMENT AND PLAN / ED COURSE  Pertinent labs & imaging results that were available during my care of the patient were reviewed by me and considered in my medical decision making (see chart for details).  The patient presents to the emergency department for chest pain during a stress test.  Differential would include ACS, unstable angina, stable angina,  chest wall pain, musculoskeletal pain, reflux.  Overall the patient appears extremely well, no distress.  Denies any symptoms since receiving nitroglycerin approximately 1 hour ago.  Patient stress test EKG was sent with the patient she has a heart rate around 101 bpm and it does appear to show a very slight amount of ST depression in leads II, III and aVF.  That depression is gone on the patient's repeat EKG here at 80 bpm.  Patient symptoms are very suggestive of stable angina she states the chest pressure with exertion has been ongoing for many months.  Patient is labs including her troponin are normal.  EKG is reassuring.  X-ray is normal.  I have reviewed the patient's records including recent PCP visit 06/26/17 in which she was referred for a stress test.  I spoke to Dr. Humphrey Rolls, of [facility] regarding the patient.  He states if the patient remains symptom-free with 2 sets of negative cardiac enzymes he recommends isosorbide 30 mg and aspirin.  He states he can see the patient in his office at 9:00 tomorrow morning.  I discussed this plan of care with the patient who is agreeable.  She remains symptom-free.  Her symptoms are most suggestive of stable angina.  Troponin 0.03 on recheck.  Patient continues to be completely symptom-free in the emergency department.  We will discharge with imdur and aspirin prescriptions.  The patient is to see Dr. Humphrey Rolls at 9:00 tomorrow morning.  Patient agreeable to plan of care.    ____________________________________________   FINAL CLINICAL IMPRESSION(S) / ED DIAGNOSES  Angina Chest pain    Harvest Dark, MD 07/03/17 [phone]

## 2017-07-05 ENCOUNTER — Other Ambulatory Visit: Payer: Self-pay | Admitting: Cardiovascular Disease

## 2017-07-05 ENCOUNTER — Encounter
Admission: RE | Disposition: A | Discharge status: Home / Self Care | Payer: Self-pay | Source: Ambulatory Visit | Attending: Internal Medicine

## 2017-07-05 ENCOUNTER — Encounter: Payer: Self-pay | Admitting: Emergency Medicine

## 2017-07-05 ENCOUNTER — Observation Stay
Admission: RE | Admit: 2017-07-05 | Discharge: 2017-07-06 | Disposition: A | Discharge status: Home / Self Care | Payer: Medicare Other | Source: Ambulatory Visit | Attending: Internal Medicine | Admitting: Internal Medicine

## 2017-07-05 DIAGNOSIS — I25110 Atherosclerotic heart disease of native coronary artery with unstable angina pectoris: Principal | ICD-10-CM | POA: Insufficient documentation

## 2017-07-05 DIAGNOSIS — K2270 Barrett's esophagus without dysplasia: Secondary | ICD-10-CM | POA: Insufficient documentation

## 2017-07-05 DIAGNOSIS — M5136 Other intervertebral disc degeneration, lumbar region: Secondary | ICD-10-CM | POA: Insufficient documentation

## 2017-07-05 DIAGNOSIS — Z880 Allergy status to penicillin: Secondary | ICD-10-CM | POA: Diagnosis not present

## 2017-07-05 DIAGNOSIS — J329 Chronic sinusitis, unspecified: Secondary | ICD-10-CM | POA: Diagnosis not present

## 2017-07-05 DIAGNOSIS — Z853 Personal history of malignant neoplasm of breast: Secondary | ICD-10-CM | POA: Insufficient documentation

## 2017-07-05 DIAGNOSIS — E119 Type 2 diabetes mellitus without complications: Secondary | ICD-10-CM | POA: Insufficient documentation

## 2017-07-05 DIAGNOSIS — Z23 Encounter for immunization: Secondary | ICD-10-CM | POA: Diagnosis not present

## 2017-07-05 DIAGNOSIS — K219 Gastro-esophageal reflux disease without esophagitis: Secondary | ICD-10-CM | POA: Diagnosis not present

## 2017-07-05 DIAGNOSIS — Z7982 Long term (current) use of aspirin: Secondary | ICD-10-CM | POA: Diagnosis not present

## 2017-07-05 DIAGNOSIS — E785 Hyperlipidemia, unspecified: Secondary | ICD-10-CM | POA: Diagnosis not present

## 2017-07-05 DIAGNOSIS — J449 Chronic obstructive pulmonary disease, unspecified: Secondary | ICD-10-CM | POA: Diagnosis not present

## 2017-07-05 DIAGNOSIS — Z923 Personal history of irradiation: Secondary | ICD-10-CM | POA: Diagnosis not present

## 2017-07-05 DIAGNOSIS — Z794 Long term (current) use of insulin: Secondary | ICD-10-CM | POA: Diagnosis not present

## 2017-07-05 DIAGNOSIS — Z955 Presence of coronary angioplasty implant and graft: Secondary | ICD-10-CM

## 2017-07-05 DIAGNOSIS — Z87891 Personal history of nicotine dependence: Secondary | ICD-10-CM | POA: Diagnosis not present

## 2017-07-05 DIAGNOSIS — E7800 Pure hypercholesterolemia, unspecified: Secondary | ICD-10-CM | POA: Diagnosis not present

## 2017-07-05 DIAGNOSIS — Z888 Allergy status to other drugs, medicaments and biological substances status: Secondary | ICD-10-CM | POA: Diagnosis not present

## 2017-07-05 DIAGNOSIS — Z8711 Personal history of peptic ulcer disease: Secondary | ICD-10-CM | POA: Insufficient documentation

## 2017-07-05 DIAGNOSIS — M170 Bilateral primary osteoarthritis of knee: Secondary | ICD-10-CM | POA: Diagnosis not present

## 2017-07-05 DIAGNOSIS — R0789 Other chest pain: Secondary | ICD-10-CM | POA: Diagnosis not present

## 2017-07-05 DIAGNOSIS — Z90710 Acquired absence of both cervix and uterus: Secondary | ICD-10-CM | POA: Diagnosis not present

## 2017-07-05 DIAGNOSIS — Z79899 Other long term (current) drug therapy: Secondary | ICD-10-CM | POA: Insufficient documentation

## 2017-07-05 DIAGNOSIS — Z8619 Personal history of other infectious and parasitic diseases: Secondary | ICD-10-CM | POA: Insufficient documentation

## 2017-07-05 DIAGNOSIS — I10 Essential (primary) hypertension: Secondary | ICD-10-CM | POA: Insufficient documentation

## 2017-07-05 DIAGNOSIS — R079 Chest pain, unspecified: Secondary | ICD-10-CM | POA: Diagnosis present

## 2017-07-05 DIAGNOSIS — Z882 Allergy status to sulfonamides status: Secondary | ICD-10-CM | POA: Diagnosis not present

## 2017-07-05 HISTORY — PX: CORONARY STENT INTERVENTION: CATH118234

## 2017-07-05 HISTORY — PX: LEFT HEART CATH AND CORONARY ANGIOGRAPHY: CATH118249

## 2017-07-05 LAB — CREATININE, SERUM
Creatinine, Ser: 0.78 mg/dL (ref 0.44–1.00)
GFR calc non Af Amer: >60 mL/min (ref >60)

## 2017-07-05 LAB — CBC
HEMATOCRIT: 39.6 % (ref 35.0–47.0)
Hemoglobin: 13.5 g/dL (ref 12.0–16.0)
MCH: 30.2 pg (ref 26.0–34.0)
MCHC: 33.9 g/dL (ref 32.0–36.0)
MCV: 89 fL (ref 80.0–100.0)
Platelets: 198 10*3/uL (ref 150–440)
RBC: 4.46 MIL/uL (ref 3.80–5.20)
RDW: 14.8 % — AB (ref 11.5–14.5)
WBC: 6.1 10*3/uL (ref 3.6–11.0)

## 2017-07-05 LAB — GLUCOSE, CAPILLARY
GLUCOSE-CAPILLARY: 183 mg/dL — AB (ref 65–99)
Glucose-Capillary: 125 mg/dL — ABNORMAL HIGH (ref 65–99)
Glucose-Capillary: 149 mg/dL — ABNORMAL HIGH (ref 65–99)

## 2017-07-05 LAB — LIPID PANEL
CHOLESTEROL: 255 mg/dL — AB (ref 0–200)
HDL: 62 mg/dL (ref >40)
LDL Cholesterol: 147 mg/dL — ABNORMAL HIGH (ref 0–99)
Total CHOL/HDL Ratio: 4.1 RATIO
Triglycerides: 231 mg/dL — ABNORMAL HIGH (ref <150)
VLDL: 46 mg/dL — ABNORMAL HIGH (ref 0–40)

## 2017-07-05 LAB — POCT ACTIVATED CLOTTING TIME: Activated Clotting Time: 312 seconds

## 2017-07-05 SURGERY — LEFT HEART CATH AND CORONARY ANGIOGRAPHY
Anesthesia: Moderate Sedation | Laterality: Right

## 2017-07-05 MED ORDER — ASPIRIN 81 MG PO CHEW
CHEWABLE_TABLET | ORAL | Status: DC | PRN
  Administered 2017-07-05: 243 mg via ORAL

## 2017-07-05 MED ORDER — TICAGRELOR 90 MG PO TABS
ORAL_TABLET | ORAL | Status: DC | PRN
  Administered 2017-07-05: 180 mg via ORAL

## 2017-07-05 MED ORDER — ASPIRIN 81 MG PO CHEW
CHEWABLE_TABLET | ORAL | Status: AC
  Filled 2017-07-05: qty 1

## 2017-07-05 MED ORDER — INSULIN ASPART PROT & ASPART (70-30 MIX) 100 UNIT/ML ~~LOC~~ SUSP
15.0000 [IU] | Freq: Two times a day (BID) | SUBCUTANEOUS | Status: DC
  Administered 2017-07-05 – 2017-07-06 (×2): 15 [IU] via SUBCUTANEOUS
  Filled 2017-07-05 (×2): qty 10

## 2017-07-05 MED ORDER — LOSARTAN POTASSIUM 50 MG PO TABS
50.0000 mg | ORAL_TABLET | ORAL | Status: DC
Start: 2017-07-06 — End: 2017-07-06
  Administered 2017-07-06: 50 mg via ORAL
  Filled 2017-07-05: qty 1

## 2017-07-05 MED ORDER — SODIUM CHLORIDE 0.9 % IV SOLN
INTRAVENOUS | Status: AC | PRN
  Administered 2017-07-05 (×2): 1.75 mg/kg/h via INTRAVENOUS

## 2017-07-05 MED ORDER — LABETALOL HCL 5 MG/ML IV SOLN: 10.0000 mg | INTRAVENOUS | Status: AC | PRN

## 2017-07-05 MED ORDER — ISOSORBIDE MONONITRATE ER 30 MG PO TB24
30.0000 mg | ORAL_TABLET | Freq: Every day | ORAL | Status: DC
  Administered 2017-07-06: 30 mg via ORAL
  Filled 2017-07-05: qty 1

## 2017-07-05 MED ORDER — PNEUMOCOCCAL VAC POLYVALENT 25 MCG/0.5ML IJ INJ
0.5000 mL | INJECTION | INTRAMUSCULAR | Status: AC
  Administered 2017-07-06: 0.5 mL via INTRAMUSCULAR
  Filled 2017-07-05: qty 0.5

## 2017-07-05 MED ORDER — SODIUM CHLORIDE 0.9 % IV SOLN: 250.0000 mL | INTRAVENOUS | Status: DC | PRN

## 2017-07-05 MED ORDER — MIDAZOLAM HCL 2 MG/2ML IJ SOLN
INTRAMUSCULAR | Status: AC
  Filled 2017-07-05: qty 2

## 2017-07-05 MED ORDER — NITROGLYCERIN 0.4 MG SL SUBL: 0.4000 mg | SUBLINGUAL_TABLET | SUBLINGUAL | Status: DC | PRN

## 2017-07-05 MED ORDER — ASPIRIN 81 MG PO CHEW
81.0000 mg | CHEWABLE_TABLET | Freq: Every day | ORAL | Status: DC
  Administered 2017-07-06: 81 mg via ORAL
  Filled 2017-07-05: qty 1

## 2017-07-05 MED ORDER — LABETALOL HCL 5 MG/ML IV SOLN
INTRAVENOUS | Status: DC | PRN
  Administered 2017-07-05 (×2): 10 mg via INTRAVENOUS

## 2017-07-05 MED ORDER — PAROXETINE HCL 20 MG PO TABS
20.0000 mg | ORAL_TABLET | Freq: Two times a day (BID) | ORAL | Status: DC
  Administered 2017-07-05 – 2017-07-06 (×2): 20 mg via ORAL
  Filled 2017-07-05 (×2): qty 1

## 2017-07-05 MED ORDER — LIDOCAINE HCL (PF) 1 % IJ SOLN
INTRAMUSCULAR | Status: AC
  Filled 2017-07-05: qty 30

## 2017-07-05 MED ORDER — SODIUM CHLORIDE 0.9% FLUSH: 3.0000 mL | INTRAVENOUS | Status: DC | PRN

## 2017-07-05 MED ORDER — SODIUM CHLORIDE 0.9% FLUSH
3.0000 mL | Freq: Two times a day (BID) | INTRAVENOUS | Status: DC
  Administered 2017-07-06: 3 mL via INTRAVENOUS

## 2017-07-05 MED ORDER — PROPRANOLOL HCL ER 60 MG PO CP24
60.0000 mg | ORAL_CAPSULE | Freq: Every day | ORAL | Status: DC
  Administered 2017-07-05 – 2017-07-06 (×2): 60 mg via ORAL
  Filled 2017-07-05 (×2): qty 1

## 2017-07-05 MED ORDER — HEPARIN (PORCINE) IN NACL 2-0.9 UNIT/ML-% IJ SOLN
INTRAMUSCULAR | Status: AC
  Filled 2017-07-05: qty 500

## 2017-07-05 MED ORDER — MIDAZOLAM HCL 2 MG/2ML IJ SOLN
INTRAMUSCULAR | Status: DC | PRN
  Administered 2017-07-05 (×2): 1 mg via INTRAVENOUS

## 2017-07-05 MED ORDER — FUROSEMIDE 10 MG/ML IJ SOLN
INTRAMUSCULAR | Status: DC | PRN
  Administered 2017-07-05: 40 mg via INTRAVENOUS

## 2017-07-05 MED ORDER — SODIUM CHLORIDE 0.9 % IV SOLN
0.2500 mg/kg/h | INTRAVENOUS | Status: AC
  Filled 2017-07-05: qty 250

## 2017-07-05 MED ORDER — SODIUM CHLORIDE 0.9 % WEIGHT BASED INFUSION: 1.0000 mL/kg/h | INTRAVENOUS | Status: DC

## 2017-07-05 MED ORDER — POLYETHYLENE GLYCOL 3350 17 G PO PACK: 17.0000 g | PACK | Freq: Every day | ORAL | Status: DC | PRN

## 2017-07-05 MED ORDER — BIVALIRUDIN TRIFLUOROACETATE 250 MG IV SOLR
INTRAVENOUS | Status: AC
  Filled 2017-07-05: qty 250

## 2017-07-05 MED ORDER — INSULIN ASPART 100 UNIT/ML ~~LOC~~ SOLN
0.0000 [IU] | Freq: Three times a day (TID) | SUBCUTANEOUS | Status: DC
  Administered 2017-07-05: 2 [IU] via SUBCUTANEOUS
  Administered 2017-07-06: 1 [IU] via SUBCUTANEOUS
  Filled 2017-07-05 (×2): qty 1

## 2017-07-05 MED ORDER — BIVALIRUDIN BOLUS VIA INFUSION - CUPID
INTRAVENOUS | Status: DC | PRN
  Administered 2017-07-05: 73.5 mg via INTRAVENOUS

## 2017-07-05 MED ORDER — FUROSEMIDE 10 MG/ML IJ SOLN
INTRAMUSCULAR | Status: AC
  Filled 2017-07-05: qty 4

## 2017-07-05 MED ORDER — ENOXAPARIN SODIUM 40 MG/0.4ML ~~LOC~~ SOLN: 40.0000 mg | SUBCUTANEOUS | Status: DC

## 2017-07-05 MED ORDER — ONDANSETRON HCL 4 MG/2ML IJ SOLN
4.0000 mg | Freq: Four times a day (QID) | INTRAMUSCULAR | Status: DC | PRN
Start: 2017-07-05 — End: 2017-07-06

## 2017-07-05 MED ORDER — ACETAMINOPHEN 325 MG PO TABS
650.0000 mg | ORAL_TABLET | Freq: Four times a day (QID) | ORAL | Status: DC | PRN
  Filled 2017-07-05: qty 2

## 2017-07-05 MED ORDER — ASPIRIN 81 MG PO CHEW
81.0000 mg | CHEWABLE_TABLET | ORAL | Status: AC
  Administered 2017-07-05: 81 mg via ORAL

## 2017-07-05 MED ORDER — HYDRALAZINE HCL 20 MG/ML IJ SOLN: 5.0000 mg | INTRAMUSCULAR | Status: AC | PRN

## 2017-07-05 MED ORDER — ACETAMINOPHEN 650 MG RE SUPP: 650.0000 mg | Freq: Four times a day (QID) | RECTAL | Status: DC | PRN

## 2017-07-05 MED ORDER — LABETALOL HCL 5 MG/ML IV SOLN
INTRAVENOUS | Status: AC
  Filled 2017-07-05: qty 4

## 2017-07-05 MED ORDER — NITROGLYCERIN 1 MG/10 ML FOR IR/CATH LAB
INTRA_ARTERIAL | Status: DC | PRN
  Administered 2017-07-05: 200 ug via INTRACORONARY

## 2017-07-05 MED ORDER — ASPIRIN 81 MG PO CHEW
CHEWABLE_TABLET | ORAL | Status: AC
  Filled 2017-07-05: qty 3

## 2017-07-05 MED ORDER — TICAGRELOR 90 MG PO TABS
90.0000 mg | ORAL_TABLET | Freq: Two times a day (BID) | ORAL | Status: DC
  Administered 2017-07-05 – 2017-07-06 (×2): 90 mg via ORAL
  Filled 2017-07-05 (×2): qty 1

## 2017-07-05 MED ORDER — SODIUM CHLORIDE 0.9% FLUSH: 3.0000 mL | Freq: Two times a day (BID) | INTRAVENOUS | Status: DC

## 2017-07-05 MED ORDER — ALPRAZOLAM 0.5 MG PO TABS: 0.5000 mg | ORAL_TABLET | Freq: Two times a day (BID) | ORAL | Status: DC | PRN

## 2017-07-05 MED ORDER — ISOSORBIDE MONONITRATE ER 30 MG PO TB24
30.0000 mg | ORAL_TABLET | Freq: Once | ORAL | Status: AC
  Administered 2017-07-05: 30 mg via ORAL
  Filled 2017-07-05: qty 1

## 2017-07-05 MED ORDER — SODIUM CHLORIDE 0.9 % WEIGHT BASED INFUSION
1.0000 mL/kg/h | INTRAVENOUS | Status: AC
  Administered 2017-07-05: 1 mL/kg/h via INTRAVENOUS

## 2017-07-05 MED ORDER — TICAGRELOR 90 MG PO TABS
ORAL_TABLET | ORAL | Status: AC
  Filled 2017-07-05: qty 2

## 2017-07-05 MED ORDER — MIDAZOLAM HCL 2 MG/2ML IJ SOLN
INTRAMUSCULAR | Status: DC | PRN
  Administered 2017-07-05: 0.5 mg via INTRAVENOUS

## 2017-07-05 MED ORDER — NITROGLYCERIN 5 MG/ML IV SOLN
INTRAVENOUS | Status: AC
  Filled 2017-07-05: qty 10

## 2017-07-05 MED ORDER — ACETAMINOPHEN 325 MG PO TABS
650.0000 mg | ORAL_TABLET | ORAL | Status: DC | PRN
  Administered 2017-07-05: 650 mg via ORAL

## 2017-07-05 MED ORDER — SODIUM CHLORIDE 0.9 % WEIGHT BASED INFUSION
3.0000 mL/kg/h | INTRAVENOUS | Status: AC
  Administered 2017-07-05: 3 mL/kg/h via INTRAVENOUS

## 2017-07-05 SURGICAL SUPPLY — 20 items
BALLN TREK RX 2.5X15 (BALLOONS) ×4
BALLN ~~LOC~~ TREK RX 3.0X15 (BALLOONS) ×4
BALLOON TREK RX 2.5X15 (BALLOONS) ×2 IMPLANT
BALLOON ~~LOC~~ TREK RX 3.0X15 (BALLOONS) ×2 IMPLANT
CATH 5FR JR4 DIAGNOSTIC (CATHETERS) ×4 IMPLANT
CATH INFINITI 5FR ANG PIGTAIL (CATHETERS) ×4 IMPLANT
CATH INFINITI 5FR JL4 (CATHETERS) ×4 IMPLANT
CATH VISTA GUIDE 6FR XB3.5 SH (CATHETERS) ×4 IMPLANT
DEVICE CLOSURE MYNXGRIP 6/7F (Vascular Products) ×4 IMPLANT
DEVICE INFLAT 30 PLUS (MISCELLANEOUS) ×4 IMPLANT
GUIDEWIRE .025 260CM (WIRE) ×4 IMPLANT
KIT MANI 3VAL PERCEP (MISCELLANEOUS) ×4 IMPLANT
NEEDLE PERC 18GX7CM (NEEDLE) ×4 IMPLANT
PACK CARDIAC CATH (CUSTOM PROCEDURE TRAY) ×4 IMPLANT
SHEATH AVANTI 6FR X 11CM (SHEATH) ×4 IMPLANT
SHEATH PINNACLE 5F 10CM (SHEATH) ×4 IMPLANT
STENT SIERRA 2.50 X 15 MM (Permanent Stent) ×4 IMPLANT
STENT SIERRA 2.50 X 23 MM (Permanent Stent) ×4 IMPLANT
WIRE EMERALD 3MM-J .035X150CM (WIRE) ×4 IMPLANT
WIRE G HI TQ BMW 190 (WIRE) ×4 IMPLANT

## 2017-07-05 NOTE — Progress Notes (Signed)
Patient can be discharged in AM on current meds with f/u next Tuesday at 11 AM.

## 2017-07-05 NOTE — Consult Note (Signed)
Tammy Hatfield is a 71 y.o. female  [id number]  Primary Cardiologist: Neoma Laming  Reason for Consultation: CP  HPI:71 YOWF witth chest pain and abnormal stress test followed by CCT[facility] showing high grade lesion mid LAD and distal LCX, confirmed by cath and now had PCI/DES.   Review of Systems: NO syncope   Past Medical History:  Diagnosis Date  . Arthritis    knees  . Barrett's esophagus   . Breast cancer ([place]) 2014   RT LUMPECTOMY  . Cancer [facility]) 2014   right breast - s\p Radiation and lumpectomy and LN dissection  . Chronic sinusitis   . COPD (chronic obstructive pulmonary disease) ([place]) 2012  . Degenerative lumbar disc   . Diabetes mellitus without complication ([place])   . Dysphagia   . Esophageal reflux   . History of Helicobacter pylori infection   . Hypercholesteremia   . Hyperlipidemia   . Hypertension   . Radiation 2014   RT BREAST CA  . Ulcer, stomach peptic    history of    Medications Prior to Admission  Medication Sig Dispense Refill  . albuterol (PROVENTIL HFA;VENTOLIN HFA) 108 (90 BASE) MCG/ACT inhaler Inhale 2 puffs into the lungs every 4 (four) hours as needed for wheezing or shortness of breath.    . ALPRAZolam (XANAX) 0.5 MG tablet Take 0.5 mg by mouth 2 (two) times daily as needed for anxiety.    Marland Kitchen glipiZIDE (GLUCOTROL) 10 MG tablet Take 10 mg by mouth daily before breakfast.    . insulin NPH-regular Human (NOVOLIN 70/30) (70-30) 100 UNIT/ML injection Inject 15-25 Units into the skin 2 (two) times daily with a meal.     . isosorbide mononitrate (IMDUR) 30 MG 24 hr tablet Take 1 tablet (30 mg total) by mouth daily. 30 tablet 0  . loratadine (CLARITIN) 10 MG tablet Take 10 mg by mouth daily as needed.     Marland Kitchen losartan (COZAAR) 50 MG tablet Take 50 mg by mouth every morning.     Marland Kitchen omeprazole (PRILOSEC) 20 MG capsule Take 20 mg by mouth 2 (two) times daily.     Marland Kitchen PARoxetine (PAXIL) 40 MG tablet Take 20 mg by mouth 2 (two) times daily.     .  propranolol ER (INDERAL LA) 60 MG 24 hr capsule Take 60 mg by mouth daily.    . traMADol (ULTRAM) 50 MG tablet Take 50 mg by mouth every 6 (six) hours as needed for moderate pain.     . valACYclovir (VALTREX) 500 MG tablet TAKE ONE CAPLET BY MOUTH TWICE DAILY FOR 3 DAYS AS NEEDED FOR  FLARE    . furosemide (LASIX) 40 MG tablet Take 1 tablet (40 mg total) by mouth daily. (Patient not taking: Reported on 07/03/2017) 30 tablet 1  . Tiotropium Bromide-Olodaterol (STIOLTO RESPIMAT) 2.5-2.5 MCG/ACT AERS Inhale 2 puffs into the lungs daily. (Patient not taking: Reported on 07/03/2017) 4 g 4  . Umeclidinium-Vilanterol 62.5-25 MCG/INH AEPB Inhale 1 puff into the lungs daily. (Patient not taking: Reported on 07/03/2017) 60 each 0     . aspirin      . sodium chloride flush  3 mL Intravenous Q12H    Infusions: . sodium chloride    . sodium chloride    . bivalirudin (ANGIOMAX) infusion 5 mg/mL 1.75 mg/kg/hr (07/05/17 1449)    Allergies  Allergen Reactions  . Metformin And Related Diarrhea  . Amoxicillin Other (See Comments)    Reaction: Yeast infection  . Avelox [Moxifloxacin]  Other (See Comments)    Reaction: Muscle pain  . Benazepril Cough  . Bupropion     Other reaction(s): Dizziness, Headache  . Byetta 10 Mcg Pen [Exenatide] Diarrhea  . Metformin Diarrhea  . Neurontin [Gabapentin] Other (See Comments)    Mouth blisters, joint pain, depression Reaction: Mouth blister, joint pain and depression  . Statins Other (See Comments)    Reaction: Mouth blisters and joint pain  . Sulfa Antibiotics Other (See Comments)    Reaction: Headache  . Ceclor [Cefaclor] Rash    Social History   Social History  . Marital status: Married    Spouse name: N/A  . Number of children: N/A  . Years of education: N/A   Occupational History  . Not on file.   Social History Main Topics  . Smoking status: Former Smoker    Quit date: 02/01/2000  . Smokeless tobacco: Never Used     Comment: former smoker  1ppd x 20 years  . Alcohol use No  . Drug use: No  . Sexual activity: Yes    Birth control/ protection: Post-menopausal   Other Topics Concern  . Not on file   Social History Narrative  . No narrative on file    Family History  Problem Relation Age of Onset  . Breast cancer Neg Hx     PHYSICAL EXAM: Vitals:   07/05/17 1152  BP: (!) 158/62  Pulse: 63  Resp: 17  Temp: 98.8 F (37.1 C)  SpO2: 95%    No intake or output data in the 24 hours ending 07/05/17 1509  General:  Well appearing. No respiratory difficulty HEENT: normal Neck: supple. no JVD. Carotids 2+ bilat; no bruits. No lymphadenopathy or thryomegaly appreciated. Cor: PMI nondisplaced. Regular rate & rhythm. No rubs, gallops or murmurs. Lungs: clear Abdomen: soft, nontender, nondistended. No hepatosplenomegaly. No bruits or masses. Good bowel sounds. Extremities: no cyanosis, clubbing, rash, edema Neuro: alert & oriented x 3, cranial nerves grossly intact. moves all 4 extremities w/o difficulty. Affect pleasant.  ECG:NSR no acute changes  Results for orders placed or performed during the hospital encounter of 07/05/17 (from the past 24 hour(s))  POCT Activated clotting time     Status: None   Collection Time: 07/05/17  2:59 PM  Result Value Ref Range   Activated Clotting Time 312 seconds   Dg Chest 2 View  Result Date: 07/03/2017 CLINICAL DATA:  Abnormal EKG with chest pain EXAM: CHEST  2 VIEW COMPARISON:  None. FINDINGS: The heart size and mediastinal contours are within normal limits. Both lungs are mildly hyperinflated. The visualized skeletal structures are unremarkable. IMPRESSION: COPD without acute abnormality. Electronically Signed   By: Inez Catalina M.D.   On: 07/03/2017 16:47     ASSESSMENT AND PLAN: Unsatble angina, with 95% mid LAD and distal LCX, and normal LVEF. PCI/DES of mid LAD done. Observe till AM and d/c home in AM with f/u Tuesday 11 AM.  Hong Timm A

## 2017-07-05 NOTE — H&P (Signed)
[facility] [facility] [place] NAME: Tammy Hatfield    MR#:  [id number]  DATE OF BIRTH:  [birth year]  DATE OF ADMISSION:  07/05/2017  PRIMARY CARE PHYSICIAN: Leonel Ramsay, MD   REQUESTING/REFERRING PHYSICIAN:  Dr Humphrey Rolls  CHIEF COMPLAINT:   Chest pain  HISTORY OF PRESENT ILLNESS:  Tammy Hatfield  is a 71 y.o. female with a known history of COPD, HLD and DM who has had on/off chest pressure. Most recently she was evaluated in ED on 10/24 for chest pain and was ruled out for ACS. She was instructed to follow up with Dr Humphrey Rolls. In his office she had an abnormal cardiac CT scan so she underwent cardiac cath today and she underwent PCI to mid LAD.She also has high grade lesion in distal LCX with normal EF.  She will be observed overnight after PCI intervention.  PAST MEDICAL HISTORY:   Past Medical History:  Diagnosis Date  . Arthritis    knees  . Barrett's esophagus   . Breast cancer ([place]) 2014   RT LUMPECTOMY  . Cancer [facility]) 2014   right breast - s\p Radiation and lumpectomy and LN dissection  . Chronic sinusitis   . COPD (chronic obstructive pulmonary disease) ([place]) 2012  . Degenerative lumbar disc   . Diabetes mellitus without complication ([place])   . Dysphagia   . Esophageal reflux   . History of Helicobacter pylori infection   . Hypercholesteremia   . Hyperlipidemia   . Hypertension   . Radiation 2014   RT BREAST CA  . Ulcer, stomach peptic    history of    PAST SURGICAL HISTORY:   Past Surgical History:  Procedure Laterality Date  . ABDOMINAL HYSTERECTOMY    . BREAST BIOPSY Right 1985   NEG  . BREAST LUMPECTOMY Right 2013  . BUNIONECTOMY Bilateral 1980  . LAPAROSCOPIC TUBAL LIGATION Bilateral 1967  . SHOULDER ARTHROSCOPY WITH ROTATOR CUFF REPAIR Right 02/09/2015   Procedure: SHOULDER ARTHROSCOPY WITH ROTATOR CUFF REPAIR,release long head biceps tendon,subacromial decompression.;  Surgeon: Leanor Kail, MD;  Location: [facility]  ORS;  Service: Orthopedics;  Laterality: Right;  . UMBILICAL HERNIA REPAIR N/A   . UVULOPALATOPHARYNGOPLASTY N/A 2001    SOCIAL HISTORY:   Social History  Substance Use Topics  . Smoking status: Former Smoker    Quit date: 02/01/2000  . Smokeless tobacco: Never Used     Comment: former smoker 1ppd x 20 years  . Alcohol use No    FAMILY HISTORY:   Family History  Problem Relation Age of Onset  . Breast cancer Neg Hx     DRUG ALLERGIES:   Allergies  Allergen Reactions  . Metformin And Related Diarrhea  . Amoxicillin Other (See Comments)    Reaction: Yeast infection  . Avelox [Moxifloxacin] Other (See Comments)    Reaction: Muscle pain  . Benazepril Cough  . Bupropion     Other reaction(s): Dizziness, Headache  . Byetta 10 Mcg Pen [Exenatide] Diarrhea  . Metformin Diarrhea  . Neurontin [Gabapentin] Other (See Comments)    Mouth blisters, joint pain, depression Reaction: Mouth blister, joint pain and depression  . Statins Other (See Comments)    Reaction: Mouth blisters and joint pain  . Sulfa Antibiotics Other (See Comments)    Reaction: Headache  . Ceclor [Cefaclor] Rash    REVIEW OF SYSTEMS:   Review of Systems  Constitutional: Negative.  Negative for chills, fever and malaise/fatigue.  HENT: Negative.  Negative for ear  discharge, ear pain, hearing loss, nosebleeds and sore throat.   Eyes: Negative.  Negative for blurred vision and pain.  Respiratory: Negative.  Negative for cough, hemoptysis, shortness of breath and wheezing.   Cardiovascular: Positive for chest pain. Negative for palpitations and leg swelling.  Gastrointestinal: Negative.  Negative for abdominal pain, blood in stool, diarrhea, nausea and vomiting.  Genitourinary: Negative.  Negative for dysuria.  Musculoskeletal: Negative.  Negative for back pain.  Skin: Negative.   Neurological: Negative for dizziness, tremors, speech change, focal weakness, seizures and headaches.  Endo/Heme/Allergies:  Negative.  Does not bruise/bleed easily.  Psychiatric/Behavioral: Negative.  Negative for depression, hallucinations and suicidal ideas.    MEDICATIONS AT HOME:   Prior to Admission medications   Medication Sig Start Date End Date Taking? Authorizing Provider  albuterol (PROVENTIL HFA;VENTOLIN HFA) 108 (90 BASE) MCG/ACT inhaler Inhale 2 puffs into the lungs every 4 (four) hours as needed for wheezing or shortness of breath.   Yes [provider]  ALPRAZolam Duanne Moron) 0.5 MG tablet Take 0.5 mg by mouth 2 (two) times daily as needed for anxiety.   Yes [provider]  glipiZIDE (GLUCOTROL) 10 MG tablet Take 10 mg by mouth daily before breakfast.   Yes [provider]  insulin NPH-regular Human (NOVOLIN 70/30) (70-30) 100 UNIT/ML injection Inject 15-25 Units into the skin 2 (two) times daily with a meal.  04/04/15  Yes [provider]  isosorbide mononitrate (IMDUR) 30 MG 24 hr tablet Take 1 tablet (30 mg total) by mouth daily. 07/03/17  Yes Harvest Dark, MD  loratadine (CLARITIN) 10 MG tablet Take 10 mg by mouth daily as needed.    Yes [provider]  losartan (COZAAR) 50 MG tablet Take 50 mg by mouth every morning.    Yes [provider]  omeprazole (PRILOSEC) 20 MG capsule Take 20 mg by mouth 2 (two) times daily.    Yes [provider]  PARoxetine (PAXIL) 40 MG tablet Take 20 mg by mouth 2 (two) times daily.    Yes [provider]  propranolol ER (INDERAL LA) 60 MG 24 hr capsule Take 60 mg by mouth daily. 04/03/17 04/03/18 Yes [provider]  traMADol (ULTRAM) 50 MG tablet Take 50 mg by mouth every 6 (six) hours as needed for moderate pain.    Yes [provider]  valACYclovir (VALTREX) 500 MG tablet TAKE ONE CAPLET BY MOUTH TWICE DAILY FOR 3 DAYS AS NEEDED FOR  FLARE 02/22/17  Yes [provider]  furosemide (LASIX) 40 MG tablet Take 1 tablet (40 mg total) by mouth daily. Patient not taking:  Reported on 07/03/2017 02/15/15   Henreitta Leber, MD  Tiotropium Bromide-Olodaterol (STIOLTO RESPIMAT) 2.5-2.5 MCG/ACT AERS Inhale 2 puffs into the lungs daily. Patient not taking: Reported on 07/03/2017 08/25/15   Vilinda Boehringer, MD  Umeclidinium-Vilanterol 62.5-25 MCG/INH AEPB Inhale 1 puff into the lungs daily. Patient not taking: Reported on 07/03/2017 08/11/15   Vilinda Boehringer, MD      VITAL SIGNS:  Blood pressure (!) 158/62, pulse 63, temperature 98.8 F (37.1 C), temperature source Oral, resp. rate 17, height 5\' 4"  (1.626 m), weight 98 kg (216 lb), SpO2 95 %.  PHYSICAL EXAMINATION:   Physical Exam  Constitutional: She is oriented to person, place, and time and well-developed, well-nourished, and in no distress. No distress.  HENT:  Head: Normocephalic.  Eyes: No scleral icterus.  Neck: Normal range of motion. Neck supple. No JVD present. No tracheal deviation present.  Cardiovascular: Normal rate, regular rhythm and normal heart sounds.  Exam reveals no gallop and no friction rub.   No murmur heard. Pulmonary/Chest: Effort normal and breath sounds normal. No respiratory distress. She has no wheezes. She has no rales. She exhibits no tenderness.  Abdominal: Soft. Bowel sounds are normal. She exhibits no distension and no mass. There is no tenderness. There is no rebound and no guarding.  Musculoskeletal: Normal range of motion. She exhibits no edema.  Neurological: She is alert and oriented to person, place, and time.  Skin: Skin is warm. No rash noted. No erythema.  Psychiatric: Affect and judgment normal.      LABORATORY PANEL:   CBC  Recent Labs Lab 07/03/17 1555  WBC 5.9  HGB 12.8  HCT 38.4  PLT 200   ------------------------------------------------------------------------------------------------------------------  Chemistries   Recent Labs Lab 07/03/17 1555  NA 138  K 4.5  CL 103  CO2 26  GLUCOSE 165*  BUN 18  CREATININE 1.08*  CALCIUM 9.2    ------------------------------------------------------------------------------------------------------------------  Cardiac Enzymes  Recent Labs Lab 07/03/17 1847  TROPONINI 0.03*   ------------------------------------------------------------------------------------------------------------------  RADIOLOGY:  Dg Chest 2 View  Result Date: 07/03/2017 CLINICAL DATA:  Abnormal EKG with chest pain EXAM: CHEST  2 VIEW COMPARISON:  None. FINDINGS: The heart size and mediastinal contours are within normal limits. Both lungs are mildly hyperinflated. The visualized skeletal structures are unremarkable. IMPRESSION: COPD without acute abnormality. Electronically Signed   By: Inez Catalina M.D.   On: 07/03/2017 16:47    EKG:  None   IMPRESSION AND PLAN:   71 y/o female with DM, HLD, HTN and COPD underwent cardiac cth for abnomal cardiac CT and now has PCI.  1. CAD with PCI placed: Continue management as per Cardiology post PCI. Continue ASA, brillanta. Imdur and consider changing to metoprolol from propanolol. She is allergic to statins.  2. DM: Continue insulin with SSI and [name] diet  3. Essential HTN: Continue Imdur. Losartan and Propanolol (for now) 4/ COPD stable and without exacerbation    All the records are reviewed and case discussed with ED provider. Management plans discussed with the patient and she is in agreement  CODE STATUS: FUll  TOTAL TIME TAKING CARE OF THIS PATIENT: 41 minutes.    Joliyah Lippens M.D on 07/05/2017 at 3:07 PM  Between 7am to 6pm - Pager - [phone]  After 6pm go to [email or web address] - password [email or web address] [facility]  Office  [phone]  CC: Primary care physician; Leonel Ramsay, MD

## 2017-07-05 NOTE — Progress Notes (Signed)
Pt complaining of slight chest tightness intermittently. VSS, denies SOB or chest pain.  Dr. Clayborn Bigness paged and updated on Pt status. Imdur 30 mg ordered STAT, ok to transfer to floor. Also Per MD to d/c Angiomax after 2 hrs at reduced rate set in cath lab. D/C at 1730. Orders placed and carried out

## 2017-07-06 DIAGNOSIS — I25110 Atherosclerotic heart disease of native coronary artery with unstable angina pectoris: Secondary | ICD-10-CM | POA: Diagnosis not present

## 2017-07-06 LAB — CBC
HCT: 38.1 % (ref 35.0–47.0)
HEMOGLOBIN: 12.9 g/dL (ref 12.0–16.0)
MCH: 30.5 pg (ref 26.0–34.0)
MCHC: 33.8 g/dL (ref 32.0–36.0)
MCV: 90.3 fL (ref 80.0–100.0)
Platelets: 182 10*3/uL (ref 150–440)
RBC: 4.22 MIL/uL (ref 3.80–5.20)
RDW: 14.8 % — AB (ref 11.5–14.5)
WBC: 5.9 10*3/uL (ref 3.6–11.0)

## 2017-07-06 LAB — GLUCOSE, CAPILLARY: GLUCOSE-CAPILLARY: 144 mg/dL — AB (ref 65–99)

## 2017-07-06 LAB — BASIC METABOLIC PANEL
ANION GAP: 7 (ref 5–15)
BUN: 19 mg/dL (ref 6–20)
CALCIUM: 8.8 mg/dL — AB (ref 8.9–10.3)
CO2: 28 mmol/L (ref 22–32)
Chloride: 105 mmol/L (ref 101–111)
Creatinine, Ser: 1 mg/dL (ref 0.44–1.00)
GFR calc Af Amer: >60 mL/min (ref >60)
GFR, EST NON AFRICAN AMERICAN: 55 mL/min — AB (ref >60)
GLUCOSE: 151 mg/dL — AB (ref 65–99)
Potassium: 4.2 mmol/L (ref 3.5–5.1)
Sodium: 140 mmol/L (ref 135–145)

## 2017-07-06 MED ORDER — METOPROLOL TARTRATE 25 MG PO TABS: 25.0000 mg | ORAL_TABLET | Freq: Two times a day (BID) | ORAL | 0 refills | Status: DC

## 2017-07-06 MED ORDER — ASPIRIN 81 MG PO CHEW: 81.0000 mg | CHEWABLE_TABLET | Freq: Every day | ORAL | Status: DC

## 2017-07-06 MED ORDER — TICAGRELOR 90 MG PO TABS: 90.0000 mg | ORAL_TABLET | Freq: Two times a day (BID) | ORAL | 0 refills | Status: DC

## 2017-07-06 MED ORDER — NITROGLYCERIN 0.4 MG SL SUBL: 0.4000 mg | SUBLINGUAL_TABLET | SUBLINGUAL | 0 refills | Status: DC | PRN

## 2017-07-06 NOTE — Discharge Summary (Signed)
[facility] [place] NAME: Tammy Hatfield    MR#:  [id number]  DATE OF BIRTH:  09-12-1945  DATE OF ADMISSION:  07/05/2017 ADMITTING PHYSICIAN: [name], MD  DATE OF DISCHARGE: 07/06/2017 No discharge date for patient encounter.  PRIMARY CARE PHYSICIAN: Leonel Ramsay, MD    ADMISSION DIAGNOSIS:  S/P drug eluting coronary stent placement [Z95.5]  DISCHARGE DIAGNOSIS:  Active Problems:   Chest pain   S/P drug eluting coronary stent placement   SECONDARY DIAGNOSIS:   Past Medical History:  Diagnosis Date  . Arthritis    knees  . Barrett's esophagus   . Breast cancer ([place]) 2014   RT LUMPECTOMY  . Cancer [facility]) 2014   right breast - s\p Radiation and lumpectomy and LN dissection  . Chronic sinusitis   . COPD (chronic obstructive pulmonary disease) ([place]) 2012  . Degenerative lumbar disc   . Diabetes mellitus without complication ([place])   . Dysphagia   . Esophageal reflux   . History of Helicobacter pylori infection   . Hypercholesteremia   . Hyperlipidemia   . Hypertension   . Radiation 2014   RT BREAST CA  . Ulcer, stomach peptic    history of    HOSPITAL COURSE:  71 year old female with a history of diabetes and hyperlipidemia who presented with chest pressure and was found to have an abnormal CTA and then underwent cardiac catheterization which showed high-grade lesion in the distal left circumflex and mid LAD   1.  Chest pain/CAD: Patient underwent cardiac catheterization and has drug-eluting stent to the mid LAD.  She needs aggressive medical management.  She will continue on aspirin, Brilinta, isosorbide and has been started on metoprolol.  Propanolol has now been discontinued.  She is allergic to statins.  She will follow-up with cardiology in 1 week.  She was referred to cardiac rehab at discharge. Cardiac catheterization revealed normal ejection fraction.   2.  Diabetes: Patient will continue  outpatient medications with a new diet   3.  Essential hypertension: Patient will continue propranolol, losartan and isosorbide.  4.  COPD: Patient has no signs of exacerbation:   DISCHARGE CONDITIONS AND DIET:   Stable for discharge and diabetic heart healthy diet  CONSULTS OBTAINED:  Treatment Team:  [name], MD  DRUG ALLERGIES:   Allergies  Allergen Reactions  . Metformin And Related Diarrhea  . Amoxicillin Other (See Comments)    Reaction: Yeast infection  . Avelox [Moxifloxacin] Other (See Comments)    Reaction: Muscle pain  . Benazepril Cough  . Bupropion     Other reaction(s): Dizziness, Headache  . Byetta 10 Mcg Pen [Exenatide] Diarrhea  . Metformin Diarrhea  . Neurontin [Gabapentin] Other (See Comments)    Mouth blisters, joint pain, depression Reaction: Mouth blister, joint pain and depression  . Statins Other (See Comments)    Reaction: Mouth blisters and joint pain  . Sulfa Antibiotics Other (See Comments)    Reaction: Headache  . Ceclor [Cefaclor] Rash    DISCHARGE MEDICATIONS:   Current Discharge Medication List    START taking these medications   Details  aspirin 81 MG chewable tablet Chew 1 tablet (81 mg total) by mouth daily.    metoprolol tartrate (LOPRESSOR) 25 MG tablet Take 1 tablet (25 mg total) by mouth 2 (two) times daily. Qty: 60 tablet, Refills: 0    nitroGLYCERIN (NITROSTAT) 0.4 MG SL tablet Place 1 tablet (0.4 mg total) under the tongue  every 5 (five) minutes as needed for chest pain. Qty: 30 tablet, Refills: 0    ticagrelor (BRILINTA) 90 MG TABS tablet Take 1 tablet (90 mg total) by mouth 2 (two) times daily. Qty: 60 tablet, Refills: 0      CONTINUE these medications which have NOT CHANGED   Details  albuterol (PROVENTIL HFA;VENTOLIN HFA) 108 (90 BASE) MCG/ACT inhaler Inhale 2 puffs into the lungs every 4 (four) hours as needed for wheezing or shortness of breath.    ALPRAZolam (XANAX) 0.5 MG tablet Take 0.5 mg by mouth  2 (two) times daily as needed for anxiety.    glipiZIDE (GLUCOTROL) 10 MG tablet Take 10 mg by mouth daily before breakfast.    insulin NPH-regular Human (NOVOLIN 70/30) (70-30) 100 UNIT/ML injection Inject 15-25 Units into the skin 2 (two) times daily with a meal.     isosorbide mononitrate (IMDUR) 30 MG 24 hr tablet Take 1 tablet (30 mg total) by mouth daily. Qty: 30 tablet, Refills: 0    loratadine (CLARITIN) 10 MG tablet Take 10 mg by mouth daily as needed.     losartan (COZAAR) 50 MG tablet Take 50 mg by mouth every morning.     omeprazole (PRILOSEC) 20 MG capsule Take 20 mg by mouth 2 (two) times daily.     PARoxetine (PAXIL) 40 MG tablet Take 20 mg by mouth 2 (two) times daily.     traMADol (ULTRAM) 50 MG tablet Take 50 mg by mouth every 6 (six) hours as needed for moderate pain.     valACYclovir (VALTREX) 500 MG tablet TAKE ONE CAPLET BY MOUTH TWICE DAILY FOR 3 DAYS AS NEEDED FOR  FLARE      STOP taking these medications     propranolol ER (INDERAL LA) 60 MG 24 hr capsule      furosemide (LASIX) 40 MG tablet      Tiotropium Bromide-Olodaterol (STIOLTO RESPIMAT) 2.5-2.5 MCG/ACT AERS      Umeclidinium-Vilanterol 62.5-25 MCG/INH AEPB           Today   CHIEF COMPLAINT:   Patient doing well this morning.  Denies any chest pain or pressure.   VITAL SIGNS:  Blood pressure (!) 114/56, pulse 64, temperature 98.4 F (36.9 C), temperature source Oral, resp. rate 20, height 5\' 4"  (1.626 m), weight 97.5 kg (215 lb), SpO2 91 %.   REVIEW OF SYSTEMS:  Review of Systems  Constitutional: Negative.  Negative for chills, fever and malaise/fatigue.  HENT: Negative.  Negative for ear discharge, ear pain, hearing loss, nosebleeds and sore throat.   Eyes: Negative.  Negative for blurred vision and pain.  Respiratory: Negative.  Negative for cough, hemoptysis, shortness of breath and wheezing.   Cardiovascular: Negative.  Negative for chest pain, palpitations and leg swelling.   Gastrointestinal: Negative.  Negative for abdominal pain, blood in stool, diarrhea, nausea and vomiting.  Genitourinary: Negative.  Negative for dysuria.  Musculoskeletal: Negative.  Negative for back pain.  Skin: Negative.   Neurological: Negative for dizziness, tremors, speech change, focal weakness, seizures and headaches.  Endo/Heme/Allergies: Negative.  Does not bruise/bleed easily.  Psychiatric/Behavioral: Negative.  Negative for depression, hallucinations and suicidal ideas.     PHYSICAL EXAMINATION:  GENERAL:  71 y.o.-year-old patient lying in the bed with no acute distress.  NECK:  Supple, no jugular venous distention. No thyroid enlargement, no tenderness.  LUNGS: Normal breath sounds bilaterally, no wheezing, rales,rhonchi  No use of accessory muscles of respiration.  CARDIOVASCULAR: S1, S2 normal. No murmurs,  rubs, or gallops.  ABDOMEN: Soft, non-tender, non-distended. Bowel sounds present. No organomegaly or mass.  EXTREMITIES: No pedal edema, cyanosis, or clubbing.  PSYCHIATRIC: The patient is alert and oriented x 3.  SKIN: No obvious rash, lesion, or ulcer.   DATA REVIEW:   CBC  Recent Labs Lab 07/06/17 0627  WBC 5.9  HGB 12.9  HCT 38.1  PLT 182    Chemistries   Recent Labs Lab 07/06/17 0627  NA 140  K 4.2  CL 105  CO2 28  GLUCOSE 151*  BUN 19  CREATININE 1.00  CALCIUM 8.8*    Cardiac Enzymes  Recent Labs Lab 07/03/17 1555 07/03/17 1847  TROPONINI <0.03 0.03*    Microbiology Results  @MICRORSLT48 @  RADIOLOGY:  No results found.    Current Discharge Medication List    START taking these medications   Details  aspirin 81 MG chewable tablet Chew 1 tablet (81 mg total) by mouth daily.    metoprolol tartrate (LOPRESSOR) 25 MG tablet Take 1 tablet (25 mg total) by mouth 2 (two) times daily. Qty: 60 tablet, Refills: 0    nitroGLYCERIN (NITROSTAT) 0.4 MG SL tablet Place 1 tablet (0.4 mg total) under the tongue every 5 (five) minutes  as needed for chest pain. Qty: 30 tablet, Refills: 0    ticagrelor (BRILINTA) 90 MG TABS tablet Take 1 tablet (90 mg total) by mouth 2 (two) times daily. Qty: 60 tablet, Refills: 0      CONTINUE these medications which have NOT CHANGED   Details  albuterol (PROVENTIL HFA;VENTOLIN HFA) 108 (90 BASE) MCG/ACT inhaler Inhale 2 puffs into the lungs every 4 (four) hours as needed for wheezing or shortness of breath.    ALPRAZolam (XANAX) 0.5 MG tablet Take 0.5 mg by mouth 2 (two) times daily as needed for anxiety.    glipiZIDE (GLUCOTROL) 10 MG tablet Take 10 mg by mouth daily before breakfast.    insulin NPH-regular Human (NOVOLIN 70/30) (70-30) 100 UNIT/ML injection Inject 15-25 Units into the skin 2 (two) times daily with a meal.     isosorbide mononitrate (IMDUR) 30 MG 24 hr tablet Take 1 tablet (30 mg total) by mouth daily. Qty: 30 tablet, Refills: 0    loratadine (CLARITIN) 10 MG tablet Take 10 mg by mouth daily as needed.     losartan (COZAAR) 50 MG tablet Take 50 mg by mouth every morning.     omeprazole (PRILOSEC) 20 MG capsule Take 20 mg by mouth 2 (two) times daily.     PARoxetine (PAXIL) 40 MG tablet Take 20 mg by mouth 2 (two) times daily.     traMADol (ULTRAM) 50 MG tablet Take 50 mg by mouth every 6 (six) hours as needed for moderate pain.     valACYclovir (VALTREX) 500 MG tablet TAKE ONE CAPLET BY MOUTH TWICE DAILY FOR 3 DAYS AS NEEDED FOR  FLARE      STOP taking these medications     propranolol ER (INDERAL LA) 60 MG 24 hr capsule      furosemide (LASIX) 40 MG tablet      Tiotropium Bromide-Olodaterol (STIOLTO RESPIMAT) 2.5-2.5 MCG/ACT AERS      Umeclidinium-Vilanterol 62.5-25 MCG/INH AEPB          Aspirin prescribed at discharge?  Yes High Intensity Statin Prescribed? (Lipitor 40-80mg  or Crestor 20-40mg ): No: She is allergic Beta Blocker Prescribed? Yes Was EF assessed during THIS hospitalization? Yes Was Cardiac Rehab II ordered? (Included Medically  managed Patients): Yes   Management  plans discussed with the patient and she is in agreement. Stable for discharge home  Patient should follow up with cardiology  CODE STATUS:     Code Status Orders        Start     Ordered   07/05/17 1751  Full code  Continuous     07/05/17 1750    Code Status History    Date Active Date Inactive Code Status Order ID Comments User Context   07/05/2017  3:57 PM 07/05/2017  5:50 PM Full Code [id number]  [name], MD Inpatient   02/11/2015  2:17 PM 02/15/2015  3:26 PM Full Code [id number]  Theodoro Grist, MD Inpatient    Advance Directive Documentation     Most Recent Value  Type of Advance Directive  Healthcare Power of [place], Living will  Pre-existing out of facility DNR order (yellow form or pink MOST form)  -  "MOST" Form in Place?  -      TOTAL TIME TAKING CARE OF THIS PATIENT: 37 minutes.    Note: This dictation was prepared with Dragon dictation along with smaller phrase technology. Any transcriptional errors that result from this process are unintentional.  Anntionette Madkins M.D on 07/06/2017 at 8:24 AM  Between 7am to 6pm - Pager - [phone] After 6pm go to [email or web address] - password [email or web address] [facility]  Office  [phone]  CC: Primary care physician; Leonel Ramsay, MD

## 2017-07-06 NOTE — Progress Notes (Addendum)
665ml NS infused from 1900 to 0251.Marland Kitchen

## 2017-07-06 NOTE — Plan of Care (Signed)
Patient for discharge today. Daughter at bedside to hear discharge instructions. Right femoral site dressing removed. Bandaid applied. AVS given with all instructions. Opportunity to ask questions given to both. All questions answers. Prescriptions given.

## 2017-07-08 ENCOUNTER — Encounter: Payer: Self-pay | Admitting: Cardiovascular Disease

## 2017-07-08 ENCOUNTER — Ambulatory Visit
Admission: RE | Admit: 2017-07-08 | Discharge: 2017-07-08 | Disposition: A | Discharge status: Home / Self Care | Payer: Medicare Other | Source: Ambulatory Visit | Attending: Oncology | Admitting: Oncology

## 2017-07-08 DIAGNOSIS — C50211 Malignant neoplasm of upper-inner quadrant of right female breast: Secondary | ICD-10-CM | POA: Diagnosis not present

## 2017-07-08 HISTORY — DX: Personal history of irradiation: Z92.3

## 2017-07-10 DIAGNOSIS — I2510 Atherosclerotic heart disease of native coronary artery without angina pectoris: Secondary | ICD-10-CM | POA: Insufficient documentation

## 2017-07-10 DIAGNOSIS — I25118 Atherosclerotic heart disease of native coronary artery with other forms of angina pectoris: Secondary | ICD-10-CM | POA: Insufficient documentation

## 2017-07-18 ENCOUNTER — Encounter: Payer: Medicare Other | Attending: Cardiovascular Disease | Admitting: *Deleted

## 2017-07-18 ENCOUNTER — Encounter: Payer: Self-pay | Admitting: *Deleted

## 2017-07-18 VITALS — Ht 64.5 in | Wt 218.0 lb

## 2017-07-18 DIAGNOSIS — Z955 Presence of coronary angioplasty implant and graft: Secondary | ICD-10-CM | POA: Diagnosis present

## 2017-07-18 DIAGNOSIS — Z881 Allergy status to other antibiotic agents status: Secondary | ICD-10-CM | POA: Insufficient documentation

## 2017-07-18 DIAGNOSIS — Z882 Allergy status to sulfonamides status: Secondary | ICD-10-CM | POA: Insufficient documentation

## 2017-07-18 DIAGNOSIS — E119 Type 2 diabetes mellitus without complications: Secondary | ICD-10-CM | POA: Diagnosis not present

## 2017-07-18 DIAGNOSIS — I10 Essential (primary) hypertension: Secondary | ICD-10-CM | POA: Diagnosis not present

## 2017-07-18 DIAGNOSIS — Z794 Long term (current) use of insulin: Secondary | ICD-10-CM | POA: Insufficient documentation

## 2017-07-18 DIAGNOSIS — Z79899 Other long term (current) drug therapy: Secondary | ICD-10-CM | POA: Insufficient documentation

## 2017-07-18 DIAGNOSIS — I2510 Atherosclerotic heart disease of native coronary artery without angina pectoris: Secondary | ICD-10-CM | POA: Diagnosis not present

## 2017-07-18 DIAGNOSIS — Z87891 Personal history of nicotine dependence: Secondary | ICD-10-CM | POA: Insufficient documentation

## 2017-07-18 DIAGNOSIS — E785 Hyperlipidemia, unspecified: Secondary | ICD-10-CM | POA: Insufficient documentation

## 2017-07-18 DIAGNOSIS — J449 Chronic obstructive pulmonary disease, unspecified: Secondary | ICD-10-CM | POA: Diagnosis not present

## 2017-07-18 DIAGNOSIS — Z888 Allergy status to other drugs, medicaments and biological substances status: Secondary | ICD-10-CM | POA: Diagnosis not present

## 2017-07-23 DIAGNOSIS — Z955 Presence of coronary angioplasty implant and graft: Secondary | ICD-10-CM

## 2017-07-23 LAB — GLUCOSE, CAPILLARY
GLUCOSE-CAPILLARY: 219 mg/dL — AB (ref 65–99)
GLUCOSE-CAPILLARY: 162 mg/dL — AB (ref 65–99)

## 2017-07-23 NOTE — Progress Notes (Signed)
Daily Session Note  Patient Details  Name: Tammy Hatfield MRN: [id number] Date of Birth: 08/26/1946 Referring Provider:     Cardiac Rehab from 07/18/2017 in [facility]  Referring Provider  khan      Encounter Date: 07/23/2017  Check In: Session Check In - 07/23/17 0857      Check-In   Location  [facility]    Staff Present  Nada Maclachlan, BA, ACSM CEP, Exercise Physiologist;Mary Kellie Shropshire, RN, BSN, Willette Pa, MA, ACSM RCEP, Exercise Physiologist    Supervising physician immediately available to respond to emergencies  See telemetry face sheet for immediately available ER MD    Medication changes reported      No    Fall or balance concerns reported     No    Warm-up and Cool-down  Performed on first and last piece of equipment    Resistance Training Performed  Yes    VAD Patient?  No      Pain Assessment   Currently in Pain?  No/denies          Social History   Tobacco Use  Smoking Status Former Smoker  . Packs/day: 2.00  . Years: 30.00  . Pack years: 60.00  . Last attempt to quit: 02/01/2000  . Years since quitting: 17.4  Smokeless Tobacco Never Used  Tobacco Comment   07/18/2017 former smoker quit 1999    Goals Met:  Exercise tolerated well Personal goals reviewed No report of cardiac concerns or symptoms Strength training completed today  Goals Unmet:  Not Applicable  Comments: First full day of exercise!  Patient was oriented to gym and equipment including functions, settings, policies, and procedures.  Patient's individual exercise prescription and treatment plan were reviewed.  All starting workloads were established based on the results of the 6 minute walk test done at initial orientation visit.  The plan for exercise progression was also introduced and progression will be customized based on patient's performance and goals.    Dr. Emily Filbert is Medical Director for [place] and [facility].

## 2017-07-23 NOTE — Patient Instructions (Signed)
Patient Instructions  Patient Details  Name: Tammy Hatfield MRN: [id number] Date of Birth: [birth year] Referring Provider:  Leonel Ramsay, MD  Below are the personal goals you chose as well as exercise and nutrition goals. Our goal is to help you keep on track towards obtaining and maintaining your goals. We will be discussing your progress on these goals with you throughout the program.  Initial Exercise Prescription: Initial Exercise Prescription - 07/18/17 1400      Date of Initial Exercise RX and Referring Provider   Date  07/18/17    Referring Provider  khan      Treadmill   MPH  1.5    Grade  0    Minutes  15    METs  1.8      Recumbant Bike   Level  1    RPM  60    Watts  5    Minutes  15    METs  1.8      NuStep   Level  2    SPM  80    Minutes  15    METs  1.8      REL-XR   Level  1    Speed  50    Minutes  15    METs  1.8      Prescription Details   Frequency (times per week)  3    Duration  Progress to 45 minutes of aerobic exercise without signs/symptoms of physical distress      Intensity   THRR 40-80% of Max Heartrate  100-132    Ratings of Perceived Exertion  11-13    Perceived Dyspnea  0-4      Resistance Training   Training Prescription  Yes    Weight  2 lbs    Reps  10-15       Exercise Goals: Frequency: Be able to perform aerobic exercise three times per week working toward 3-5 days per week.  Intensity: Work with a perceived exertion of 11 (fairly light) - 15 (hard) as tolerated. Follow your new exercise prescription and watch for changes in prescription as you progress with the program. Changes will be reviewed with you when they are made.  Duration: You should be able to do 30 minutes of continuous aerobic exercise in addition to a 5 minute warm-up and a 5 minute cool-down routine.  Nutrition Goals: Your personal nutrition goals will be established when you do your nutrition analysis with the dietician.  The following  are nutrition guidelines to follow: Cholesterol < 200mg /day Sodium < 1500mg /day Fiber: Women over 50 yrs - 21 grams per day  Personal Goals: Personal Goals and Risk Factors at Admission - 07/18/17 1341      Core Components/Risk Factors/Patient Goals on Admission    Weight Management  Yes;Obesity;Weight Loss    Intervention  Weight Management: Develop a combined nutrition and exercise program designed to reach desired caloric intake, while maintaining appropriate intake of nutrient and fiber, sodium and fats, and appropriate energy expenditure required for the weight goal.;Weight Management/Obesity: Establish reasonable short term and long term weight goals.;Obesity: Provide education and appropriate resources to help participant work on and attain dietary goals.    Admit Weight  218 lb (98.9 kg)    Goal Weight: Short Term  215 lb (97.5 kg)    Goal Weight: Long Term  150 lb (68 kg)    Expected Outcomes  Short Term: Continue to assess and modify interventions until short term  weight is achieved;Long Term: Adherence to nutrition and physical activity/exercise program aimed toward attainment of established weight goal;Weight Loss: Understanding of general recommendations for a balanced deficit meal plan, which promotes 1-2 lb weight loss per week and includes a negative energy balance of [phone] kcal/d    Diabetes  Yes    Intervention  Provide education about signs/symptoms and action to take for hypo/hyperglycemia.;Provide education about proper nutrition, including hydration, and aerobic/resistive exercise prescription along with prescribed medications to achieve blood glucose in normal ranges: Fasting glucose 65-99 mg/dL    Expected Outcomes  Short Term: Participant verbalizes understanding of the signs/symptoms and immediate care of hyper/hypoglycemia, proper foot care and importance of medication, aerobic/resistive exercise and nutrition plan for blood glucose control.;Long Term: Attainment of  HbA1C < 7%.    Hypertension  Yes    Intervention  Provide education on lifestyle modifcations including regular physical activity/exercise, weight management, moderate sodium restriction and increased consumption of fresh fruit, vegetables, and low fat dairy, alcohol moderation, and smoking cessation.;Monitor prescription use compliance.    Expected Outcomes  Short Term: Continued assessment and intervention until BP is < 140/1mm HG in hypertensive participants. < 130/47mm HG in hypertensive participants with diabetes, heart failure or chronic kidney disease.;Long Term: Maintenance of blood pressure at goal levels.    Lipids  Yes    Intervention  Provide education and support for participant on nutrition & aerobic/resistive exercise along with prescribed medications to achieve LDL 70mg , HDL >40mg .    Expected Outcomes  Short Term: Participant states understanding of desired cholesterol values and is compliant with medications prescribed. Participant is following exercise prescription and nutrition guidelines.;Long Term: Cholesterol controlled with medications as prescribed, with individualized exercise RX and with personalized nutrition plan. Value goals: LDL < 70mg , HDL > 40 mg.    Stress  Yes    Intervention  Offer individual and/or small group education and counseling on adjustment to heart disease, stress management and health-related lifestyle change. Teach and support self-help strategies.;Refer participants experiencing significant psychosocial distress to appropriate mental health specialists for further evaluation and treatment. When possible, include family members and significant others in education/counseling sessions.    Expected Outcomes  Short Term: Participant demonstrates changes in health-related behavior, relaxation and other stress management skills, ability to obtain effective social support, and compliance with psychotropic medications if prescribed.;Long Term: Emotional wellbeing is  indicated by absence of clinically significant psychosocial distress or social isolation.       Tobacco Use Initial Evaluation: Social History   Tobacco Use  Smoking Status Former Smoker  . Packs/day: 2.00  . Years: 30.00  . Pack years: 60.00  . Last attempt to quit: 02/01/2000  . Years since quitting: 17.4  Smokeless Tobacco Never Used  Tobacco Comment   07/18/2017 former smoker quit 1999    Exercise Goals and Review: Exercise Goals    Row Name 07/18/17 1427             Exercise Goals   Intervention  Provide advice, education, support and counseling about physical activity/exercise needs.;Develop an individualized exercise prescription for aerobic and resistive training based on initial evaluation findings, risk stratification, comorbidities and participant's personal goals.       Expected Outcomes  Achievement of increased cardiorespiratory fitness and enhanced flexibility, muscular endurance and strength shown through measurements of functional capacity and personal statement of participant.       Increase Strength and Stamina  Yes       Intervention  Provide advice, education, support  and counseling about physical activity/exercise needs.;Develop an individualized exercise prescription for aerobic and resistive training based on initial evaluation findings, risk stratification, comorbidities and participant's personal goals.       Expected Outcomes  Achievement of increased cardiorespiratory fitness and enhanced flexibility, muscular endurance and strength shown through measurements of functional capacity and personal statement of participant.       Able to understand and use rate of perceived exertion (RPE) scale  Yes       Intervention  Provide education and explanation on how to use RPE scale       Expected Outcomes  Short Term: Able to use RPE daily in rehab to express subjective intensity level;Long Term:  Able to use RPE to guide intensity level when exercising  independently       Able to understand and use Dyspnea scale  Yes       Intervention  Provide education and explanation on how to use Dyspnea scale       Expected Outcomes  Short Term: Able to use Dyspnea scale daily in rehab to express subjective sense of shortness of breath during exertion;Long Term: Able to use Dyspnea scale to guide intensity level when exercising independently       Knowledge and understanding of Target Heart Rate Range (THRR)  Yes       Intervention  Provide education and explanation of THRR including how the numbers were predicted and where they are located for reference       Expected Outcomes  Short Term: Able to state/look up THRR;Long Term: Able to use THRR to govern intensity when exercising independently;Short Term: Able to use daily as guideline for intensity in rehab       Able to check pulse independently  Yes       Intervention  Provide education and demonstration on how to check pulse in carotid and radial arteries.;Review the importance of being able to check your own pulse for safety during independent exercise       Expected Outcomes  Short Term: Able to explain why pulse checking is important during independent exercise;Long Term: Able to check pulse independently and accurately       Understanding of Exercise Prescription  Yes       Intervention  Provide education, explanation, and written materials on patient's individual exercise prescription       Expected Outcomes  Short Term: Able to explain program exercise prescription;Long Term: Able to explain home exercise prescription to exercise independently          Copy of goals given to participant.

## 2017-07-25 DIAGNOSIS — Z955 Presence of coronary angioplasty implant and graft: Secondary | ICD-10-CM

## 2017-07-25 LAB — GLUCOSE, CAPILLARY
GLUCOSE-CAPILLARY: 132 mg/dL — AB (ref 65–99)
GLUCOSE-CAPILLARY: 71 mg/dL (ref 65–99)
GLUCOSE-CAPILLARY: 93 mg/dL (ref 65–99)

## 2017-07-25 NOTE — Progress Notes (Signed)
Daily Session Note  Patient Details  Name: Tammy Hatfield MRN: [id number] Date of Birth: May 25, 1946 Referring Provider:     Cardiac Rehab from 07/18/2017 in [facility] Cardiac and Pulmonary Rehab  Referring Provider  khan      Encounter Date: 07/25/2017  Check In: Session Check In - 07/25/17 0830      Check-In   Location  [facility]-Cardiac & Pulmonary Rehab    Staff Present  Nada Maclachlan, BA, ACSM CEP, Exercise Physiologist;Other;Krista Frederico Hamman, RN BSN    Supervising physician immediately available to respond to emergencies  See telemetry face sheet for immediately available ER MD    Medication changes reported      No    Fall or balance concerns reported     No    Warm-up and Cool-down  Performed on first and last piece of equipment    Resistance Training Performed  Yes    VAD Patient?  No      Pain Assessment   Currently in Pain?  No/denies    Multiple Pain Sites  No          Social History   Tobacco Use  Smoking Status Former Smoker  . Packs/day: 2.00  . Years: 30.00  . Pack years: 60.00  . Last attempt to quit: 02/01/2000  . Years since quitting: 17.4  Smokeless Tobacco Never Used  Tobacco Comment   07/18/2017 former smoker quit 1999    Goals Met:  Proper associated with RPD/PD & O2 Sat Exercise tolerated well No report of cardiac concerns or symptoms Strength training completed today  Goals Unmet:  Not Applicable  Comments: Pt able to follow exercise prescription today without complaint.  Will continue to monitor for progression.    Dr. Emily Filbert is Medical Director for [place] and [facility].

## 2017-07-30 DIAGNOSIS — Z955 Presence of coronary angioplasty implant and graft: Secondary | ICD-10-CM | POA: Diagnosis not present

## 2017-07-30 LAB — GLUCOSE, CAPILLARY
Glucose-Capillary: 220 mg/dL — ABNORMAL HIGH (ref 65–99)
Glucose-Capillary: 134 mg/dL — ABNORMAL HIGH (ref 65–99)

## 2017-07-30 NOTE — Progress Notes (Signed)
Daily Session Note  Patient Details  Name: Tammy Hatfield MRN: [id number] Date of Birth: [birth year] Referring Provider:     Cardiac Rehab from 07/18/2017 in [facility] Cardiac and Pulmonary Rehab  Referring Provider  khan      Encounter Date: 07/30/2017  Check In: Session Check In - 07/30/17 0835      Check-In   Location  [facility]-Cardiac & Pulmonary Rehab    Staff Present  Nada Maclachlan, BA, ACSM CEP, Exercise Physiologist;Susanne Bice, RN, BSN, CCRP;Joseph Darrin Nipper, [place], ACSM [place], Exercise Physiologist;Diane Joya Gaskins RN,BSN    Supervising physician immediately available to respond to emergencies  See telemetry face sheet for immediately available ER MD    Medication changes reported      No    Fall or balance concerns reported     No    Warm-up and Cool-down  Performed on first and last piece of equipment    Resistance Training Performed  Yes    VAD Patient?  No      Pain Assessment   Currently in Pain?  No/denies    Multiple Pain Sites  No          Social History   Tobacco Use  Smoking Status Former Smoker  . Packs/day: 2.00  . Years: 30.00  . Pack years: 60.00  . Last attempt to quit: 02/01/2000  . Years since quitting: 17.5  Smokeless Tobacco Never Used  Tobacco Comment   07/18/2017 former smoker quit 1999    Goals Met:  Independence with exercise equipment Exercise tolerated well Personal goals reviewed No report of cardiac concerns or symptoms Strength training completed today  Goals Unmet:  Not Applicable  Comments: Pt able to follow exercise prescription today without complaint.  Will continue to monitor for progression.  See ITP for goal review  Dr. Emily Filbert is Medical Director for [place] and [facility].

## 2017-07-31 ENCOUNTER — Encounter: Payer: Medicare Other | Admitting: *Deleted

## 2017-07-31 DIAGNOSIS — Z955 Presence of coronary angioplasty implant and graft: Secondary | ICD-10-CM

## 2017-07-31 NOTE — Progress Notes (Signed)
Daily Session Note  Patient Details  Name: Tammy Hatfield MRN: [id number] Date of Birth: 04-20-1946 Referring Provider:     Cardiac Rehab from 07/18/2017 in [facility] Cardiac and Pulmonary Rehab  Referring Provider  khan      Encounter Date: 07/31/2017  Check In: Session Check In - 07/31/17 1629      Check-In   Location  [facility]-Cardiac & Pulmonary Rehab    Staff Present  Renita Papa, RN Vickki Hearing, BA, ACSM CEP, Exercise Physiologist;Carroll Enterkin, RN, BSN    Supervising physician immediately available to respond to emergencies  See telemetry face sheet for immediately available ER MD    Medication changes reported      No    Fall or balance concerns reported     No    Warm-up and Cool-down  Performed on first and last piece of equipment    Resistance Training Performed  Yes    VAD Patient?  No      Pain Assessment   Currently in Pain?  No/denies          Social History   Tobacco Use  Smoking Status Former Smoker  . Packs/day: 2.00  . Years: 30.00  . Pack years: 60.00  . Last attempt to quit: 02/01/2000  . Years since quitting: 17.5  Smokeless Tobacco Never Used  Tobacco Comment   07/18/2017 former smoker quit 1999    Goals Met:  Independence with exercise equipment Exercise tolerated well No report of cardiac concerns or symptoms Strength training completed today  Goals Unmet:  Not Applicable  Comments: Pt able to follow exercise prescription today without complaint.  Will continue to monitor for progression.    Dr. Emily Filbert is Medical Director for [place] and [facility].

## 2017-08-06 ENCOUNTER — Encounter: Payer: Medicare Other | Admitting: *Deleted

## 2017-08-06 DIAGNOSIS — Z955 Presence of coronary angioplasty implant and graft: Secondary | ICD-10-CM | POA: Diagnosis not present

## 2017-08-06 NOTE — Progress Notes (Signed)
Daily Session Note  Patient Details  Name: Tammy Hatfield MRN: [id number] Date of Birth: [birth year] Referring Provider:     Cardiac Rehab from 07/18/2017 in [facility] Cardiac and Pulmonary Rehab  Referring Provider  khan      Encounter Date: 08/06/2017  Check In: Session Check In - 08/06/17 0823      Check-In   Location  [facility]-Cardiac & Pulmonary Rehab    Staff Present  Nada Maclachlan, BA, ACSM CEP, Exercise Physiologist;Joseph Christain [place], RN BSN;Lakeesha Fontanilla Luan Pulling, [place], ACSM RCEP, Exercise Physiologist    Supervising physician immediately available to respond to emergencies  See telemetry face sheet for immediately available ER MD    Medication changes reported      No    Fall or balance concerns reported     No    Warm-up and Cool-down  Performed on first and last piece of equipment    Resistance Training Performed  Yes    VAD Patient?  No      Pain Assessment   Currently in Pain?  No/denies          Social History   Tobacco Use  Smoking Status Former Smoker  . Packs/day: 2.00  . Years: 30.00  . Pack years: 60.00  . Last attempt to quit: 02/01/2000  . Years since quitting: 17.5  Smokeless Tobacco Never Used  Tobacco Comment   07/18/2017 former smoker quit 1999    Goals Met:  Independence with exercise equipment Exercise tolerated well No report of cardiac concerns or symptoms Strength training completed today  Goals Unmet:  Not Applicable  Comments: Pt able to follow exercise prescription today without complaint.  Will continue to monitor for progression.    Dr. Emily Filbert is Medical Director for [place] and [facility].

## 2017-08-08 DIAGNOSIS — Z955 Presence of coronary angioplasty implant and graft: Secondary | ICD-10-CM | POA: Diagnosis not present

## 2017-08-08 NOTE — Progress Notes (Signed)
Daily Session Note  Patient Details  Name: LIS SAVITT MRN: [id number] Date of Birth: [birth year] Referring Provider:     Cardiac Rehab from 07/18/2017 in [facility] Cardiac and Pulmonary Rehab  Referring Provider  khan      Encounter Date: 08/08/2017  Check In: Session Check In - 08/08/17 0841      Check-In   Location  [facility]-Cardiac & Pulmonary Rehab    Staff Present  Renita Papa, RN Vickki Hearing, BA, ACSM CEP, Exercise Physiologist;Other;Jessica Luan Pulling, MA, ACSM RCEP, Exercise Physiologist [place] physician immediately available to respond to emergencies  See telemetry face sheet for immediately available ER MD    Medication changes reported      No    Fall or balance concerns reported     No    Warm-up and Cool-down  Performed on first and last piece of equipment    Resistance Training Performed  Yes    VAD Patient?  No      Pain Assessment   Currently in Pain?  No/denies    Multiple Pain Sites  No          Social History   Tobacco Use  Smoking Status Former Smoker  . Packs/day: 2.00  . Years: 30.00  . Pack years: 60.00  . Last attempt to quit: 02/01/2000  . Years since quitting: 17.5  Smokeless Tobacco Never Used  Tobacco Comment   07/18/2017 former smoker quit 1999    Goals Met:  Independence with exercise equipment Strength training completed today  Goals Unmet:  Not Applicable  [name] mentioned that she has had some "burning" in chest and neck similar to symptoms before her event.  She slowed down and the sensation went away.  She was advised to call her cardiologist today.  She saw him Monday but they did not discuss this symptom.   Dr. Emily Filbert is Medical Director for [place] and [facility].

## 2017-08-13 ENCOUNTER — Encounter: Payer: Medicare Other | Attending: Cardiovascular Disease

## 2017-08-13 DIAGNOSIS — I2510 Atherosclerotic heart disease of native coronary artery without angina pectoris: Secondary | ICD-10-CM | POA: Diagnosis not present

## 2017-08-13 DIAGNOSIS — E119 Type 2 diabetes mellitus without complications: Secondary | ICD-10-CM | POA: Diagnosis not present

## 2017-08-13 DIAGNOSIS — Z881 Allergy status to other antibiotic agents status: Secondary | ICD-10-CM | POA: Diagnosis not present

## 2017-08-13 DIAGNOSIS — Z87891 Personal history of nicotine dependence: Secondary | ICD-10-CM | POA: Insufficient documentation

## 2017-08-13 DIAGNOSIS — Z882 Allergy status to sulfonamides status: Secondary | ICD-10-CM | POA: Diagnosis not present

## 2017-08-13 DIAGNOSIS — Z79899 Other long term (current) drug therapy: Secondary | ICD-10-CM | POA: Diagnosis not present

## 2017-08-13 DIAGNOSIS — I10 Essential (primary) hypertension: Secondary | ICD-10-CM | POA: Insufficient documentation

## 2017-08-13 DIAGNOSIS — Z794 Long term (current) use of insulin: Secondary | ICD-10-CM | POA: Diagnosis not present

## 2017-08-13 DIAGNOSIS — Z955 Presence of coronary angioplasty implant and graft: Secondary | ICD-10-CM | POA: Insufficient documentation

## 2017-08-13 DIAGNOSIS — Z888 Allergy status to other drugs, medicaments and biological substances status: Secondary | ICD-10-CM | POA: Insufficient documentation

## 2017-08-13 DIAGNOSIS — J449 Chronic obstructive pulmonary disease, unspecified: Secondary | ICD-10-CM | POA: Diagnosis not present

## 2017-08-13 DIAGNOSIS — E785 Hyperlipidemia, unspecified: Secondary | ICD-10-CM | POA: Diagnosis not present

## 2017-08-13 NOTE — Progress Notes (Signed)
Daily Session Note  Patient Details  Name: Tammy Hatfield MRN: [id number] Date of Birth: [birth year] Referring Provider:     Cardiac Rehab from 07/18/2017 in [facility] Cardiac and Pulmonary Rehab  Referring Provider  khan      Encounter Date: 08/13/2017  Check In: Session Check In - 08/13/17 0837      Check-In   Location  [facility]-Cardiac & Pulmonary Rehab    Staff Present  Tammy Hatfield, BA, ACSM CEP, Exercise Physiologist;Tammy Bice, RN, BSN, CCRP;Tammy Luan Pulling, MA, ACSM RCEP, Exercise Physiologist    Supervising physician immediately available to respond to emergencies  See telemetry face sheet for immediately available ER MD    Medication changes reported      Yes    Comments  Started Crestor 10 mg, Plavix 75 mg, stopped Ranexa 500 mg    Fall or balance concerns reported     No    Warm-up and Cool-down  Performed on first and last piece of equipment    Resistance Training Performed  Yes    VAD Patient?  No      Pain Assessment   Currently in Pain?  No/denies    Multiple Pain Sites  No          Social History   Tobacco Use  Smoking Status Former Smoker  . Packs/day: 2.00  . Years: 30.00  . Pack years: 60.00  . Last attempt to quit: 02/01/2000  . Years since quitting: 17.5  Smokeless Tobacco Never Used  Tobacco Comment   07/18/2017 former smoker quit 1999    Goals Met:  Independence with exercise equipment Exercise tolerated well Strength training completed today  Goals Unmet:  Not Applicable  Comments: Reviewed home exercise with pt today.  Pt plans to use facility where she lives for exercise.  Reviewed THR, pulse, RPE, sign and symptoms, NTG use, and when to call 911 or MD.  Also discussed weather considerations and indoor options.  Pt voiced understanding.  [name] saw her Dr last week after having s/s - will get stress test results today.  Advised her to follow Dr orders about exercise and follow up with staff Thursday.   Dr. Emily Hatfield is Medical Director  for [place] and [facility].

## 2017-08-14 ENCOUNTER — Encounter: Payer: Self-pay | Admitting: *Deleted

## 2017-08-14 DIAGNOSIS — Z955 Presence of coronary angioplasty implant and graft: Secondary | ICD-10-CM

## 2017-08-14 NOTE — Progress Notes (Signed)
Cardiac Individual Treatment Plan  Patient Details  Name: Tammy Hatfield MRN: [id number] Date of Birth: [birth year] Referring Provider:     Cardiac Rehab from 07/18/2017 in [facility] Cardiac and Pulmonary Rehab  Referring Provider  khan      Initial Encounter Date:    Cardiac Rehab from 07/18/2017 in [facility] Cardiac and Pulmonary Rehab  Date  07/18/17  Referring Provider  khan      Visit Diagnosis: Status post coronary artery stent placement  Patient's Home Medications on Admission:  Current Outpatient Medications:  .  albuterol (PROVENTIL HFA;VENTOLIN HFA) 108 (90 BASE) MCG/ACT inhaler, Inhale 2 puffs into the lungs every 4 (four) hours as needed for wheezing or shortness of breath., Disp: , Rfl:  .  ALPRAZolam (XANAX) 0.5 MG tablet, Take 0.5 mg by mouth 2 (two) times daily as needed for anxiety., Disp: , Rfl:  .  aspirin 81 MG chewable tablet, Chew 1 tablet (81 mg total) by mouth daily., Disp: , Rfl:  .  glipiZIDE (GLUCOTROL) 10 MG tablet, Take 10 mg by mouth daily before breakfast., Disp: , Rfl:  .  insulin NPH-regular Human (NOVOLIN 70/30) (70-30) 100 UNIT/ML injection, Inject 15-25 Units into the skin 2 (two) times daily with a meal. , Disp: , Rfl:  .  isosorbide mononitrate (IMDUR) 30 MG 24 hr tablet, Take 1 tablet (30 mg total) by mouth daily., Disp: 30 tablet, Rfl: 0 .  loratadine (CLARITIN) 10 MG tablet, Take 10 mg by mouth daily as needed. , Disp: , Rfl:  .  losartan (COZAAR) 50 MG tablet, Take 50 mg by mouth every morning. , Disp: , Rfl:  .  metoprolol succinate (TOPROL-XL) 25 MG 24 hr tablet, , Disp: , Rfl:  .  metoprolol tartrate (LOPRESSOR) 25 MG tablet, Take 1 tablet (25 mg total) by mouth 2 (two) times daily., Disp: 60 tablet, Rfl: 0 .  nitroGLYCERIN (NITROSTAT) 0.4 MG SL tablet, Place 1 tablet (0.4 mg total) under the tongue every 5 (five) minutes as needed for chest pain., Disp: 30 tablet, Rfl: 0 .  omeprazole (PRILOSEC) 20 MG capsule, Take 20 mg by mouth 2 (two) times  daily. , Disp: , Rfl:  .  PARoxetine (PAXIL) 40 MG tablet, Take 20 mg by mouth 2 (two) times daily. , Disp: , Rfl:  .  ticagrelor (BRILINTA) 90 MG TABS tablet, Take 1 tablet (90 mg total) by mouth 2 (two) times daily., Disp: 60 tablet, Rfl: 0 .  traMADol (ULTRAM) 50 MG tablet, Take 50 mg by mouth every 6 (six) hours as needed for moderate pain. , Disp: , Rfl:  .  valACYclovir (VALTREX) 500 MG tablet, TAKE ONE CAPLET BY MOUTH TWICE DAILY FOR 3 DAYS AS NEEDED FOR  FLARE, Disp: , Rfl:   Past Medical History: Past Medical History:  Diagnosis Date  . Arthritis    knees  . Barrett's esophagus   . Breast cancer ([place]) 2014   RT LUMPECTOMY  . Cancer [facility]) 2014   right breast - s\p Radiation and lumpectomy and LN dissection  . Chronic sinusitis   . COPD (chronic obstructive pulmonary disease) ([place]) 2012  . Degenerative lumbar disc   . Diabetes mellitus without complication ([place])   . Dysphagia   . Esophageal reflux   . History of Helicobacter pylori infection   . Hypercholesteremia   . Hyperlipidemia   . Hypertension   . Personal history of radiation therapy   . Radiation 2014   RT BREAST CA  . Ulcer, stomach peptic  history of    Tobacco Use: Social History   Tobacco Use  Smoking Status Former Smoker  . Packs/day: 2.00  . Years: 30.00  . Pack years: 60.00  . Last attempt to quit: 02/01/2000  . Years since quitting: 17.5  Smokeless Tobacco Never Used  Tobacco Comment   07/18/2017 former smoker quit 1999    Labs: Recent Review Flowsheet Data    Labs for ITP Cardiac and Pulmonary Rehab Latest Ref Rng & Units 02/11/2015 07/05/2017   Cholestrol 0 - 200 mg/dL - 255(H)   LDLCALC 0 - 99 mg/dL - 147(H)   HDL >40 mg/dL - 62   Trlycerides <150 mg/dL - 231(H)   Hemoglobin A1c 4.0 - 6.0 % 7.4(H) -       Exercise Target Goals:    Exercise Program Goal: Individual exercise prescription set with THRR, safety & activity barriers. Participant demonstrates ability to understand and  report RPE using BORG scale, to self-measure pulse accurately, and to acknowledge the importance of the exercise prescription.  Exercise Prescription Goal: Starting with aerobic activity 30 plus minutes a day, 3 days per week for initial exercise prescription. Provide home exercise prescription and guidelines that participant acknowledges understanding prior to discharge.  Activity Barriers & Risk Stratification: Activity Barriers & Cardiac Risk Stratification - 07/18/17 1331      Activity Barriers & Cardiac Risk Stratification   Activity Barriers  Arthritis;Back Problems;Chest Pain/Angina;History of Falls;Joint Problems BOth knees "bone on bone"  needs surgery. Disc disease, bulging disc, arthritis all over.  Angina with exertion, History of falls because of "knees going out"    Cardiac Risk Stratification  High       6 Minute Walk: 6 Minute Walk    Row Name 07/18/17 1429         6 Minute Walk   Distance  912 feet     Walk Time  6 minutes     # of Rest Breaks  0     MPH  1.72     METS  1.8     RPE  14     Perceived Dyspnea   3     VO2 Peak  6.3     Symptoms  No     Resting HR  68 bpm     Resting BP  118/62     Resting Oxygen Saturation   95 %     Exercise Oxygen Saturation  during 6 min walk  95 %     Max Ex. HR  100 bpm     Max Ex. BP  156/66     2 Minute Post BP  130/64        Oxygen Initial Assessment:   Oxygen Re-Evaluation:   Oxygen Discharge (Final Oxygen Re-Evaluation):   Initial Exercise Prescription: Initial Exercise Prescription - 07/18/17 1400      Date of Initial Exercise RX and Referring Provider   Date  07/18/17    Referring Provider  khan      Treadmill   MPH  1.5    Grade  0    Minutes  15    METs  1.8      Recumbant Bike   Level  1    RPM  60    Watts  5    Minutes  15    METs  1.8      NuStep   Level  2    SPM  80    Minutes  15  METs  1.8      REL-XR   Level  1    Speed  50    Minutes  15    METs  1.8       Prescription Details   Frequency (times per week)  3    Duration  Progress to 45 minutes of aerobic exercise without signs/symptoms of physical distress      Intensity   THRR 40-80% of Max Heartrate  100-132    Ratings of Perceived Exertion  11-13    Perceived Dyspnea  0-4      Resistance Training   Training Prescription  Yes    Weight  2 lbs    Reps  10-15       Perform Capillary Blood Glucose checks as needed.  Exercise Prescription Changes: Exercise Prescription Changes    Row Name 07/18/17 1300 07/23/17 1500 08/06/17 1400         Response to Exercise   Blood Pressure (Admit)  118/62  110/62  126/74     Blood Pressure (Exercise)  156/66  146/72  130/54     Blood Pressure (Exit)  130/64  136/76  122/64     Heart Rate (Admit)  72 bpm  87 bpm  84 bpm     Heart Rate (Exercise)  100 bpm  105 bpm  99 bpm     Heart Rate (Exit)  71 bpm  79 bpm  82 bpm     Rating of Perceived Exertion (Exercise)  '14  11  13     '$ Symptoms  -  none  none     Comments  -  first full day of exercise  -     Duration  -  Progress to 45 minutes of aerobic exercise without signs/symptoms of physical distress  Progress to 45 minutes of aerobic exercise without signs/symptoms of physical distress     Intensity  -  THRR unchanged  THRR unchanged       Progression   Progression  -  Continue to progress workloads to maintain intensity without signs/symptoms of physical distress.  Continue to progress workloads to maintain intensity without signs/symptoms of physical distress.     Average METs  -  1.98  2.58       Resistance Training   Training Prescription  -  Yes  Yes     Weight  -  2 lbs  2 lbs     Reps  -  10-15  10-15       Interval Training   Interval Training  -  No  No       Treadmill   MPH  -  1.5  1.5     Grade  -  0  0     Minutes  -  15  15     METs  -  1.8  2.15       NuStep   Level  -  -  2     Minutes  -  -  15     METs  -  -  1.8       REL-XR   Level  -  1  2     Minutes  -   15  15     METs  -  1.8  2.2        Exercise Comments: Exercise Comments    Row Name 07/23/17 [phone] 08/08/17 0921 08/13/17 1104  Exercise Comments  First full day of exercise!  Patient was oriented to gym and equipment including functions, settings, policies, and procedures.  Patient's individual exercise prescription and treatment plan were reviewed.  All starting workloads were established based on the results of the 6 minute walk test done at initial orientation visit.  The plan for exercise progression was also introduced and progression will be customized based on patient's performance and goals.  [name] mentioned that she has had some "burning" in chest and neck similar to symptoms before her event.  She slowed down and the sensation went away.  She was advised to call her cardiologist today.  She saw him Monday but they did not discuss this symptom.   Reviewed home exercise with pt today.  Pt plans to use facility where she lives for exercise.  Reviewed THR, pulse, RPE, sign and symptoms, NTG use, and when to call 911 or MD.  Also discussed weather considerations and indoor options.  Pt voiced understanding.  [name] saw her Dr last week after having s/s - will get stress test results today.  Advised her to follow Dr orders about exercise and follow up with staff Thursday.        Exercise Goals and Review: Exercise Goals    Row Name 07/18/17 1427             Exercise Goals   Intervention  Provide advice, education, support and counseling about physical activity/exercise needs.;Develop an individualized exercise prescription for aerobic and resistive training based on initial evaluation findings, risk stratification, comorbidities and participant's personal goals.       Expected Outcomes  Achievement of increased cardiorespiratory fitness and enhanced flexibility, muscular endurance and strength shown through measurements of functional capacity and personal statement of participant.        Increase Strength and Stamina  Yes       Intervention  Provide advice, education, support and counseling about physical activity/exercise needs.;Develop an individualized exercise prescription for aerobic and resistive training based on initial evaluation findings, risk stratification, comorbidities and participant's personal goals.       Expected Outcomes  Achievement of increased cardiorespiratory fitness and enhanced flexibility, muscular endurance and strength shown through measurements of functional capacity and personal statement of participant.       Able to understand and use rate of perceived exertion (RPE) scale  Yes       Intervention  Provide education and explanation on how to use RPE scale       Expected Outcomes  Short Term: Able to use RPE daily in rehab to express subjective intensity level;Long Term:  Able to use RPE to guide intensity level when exercising independently       Able to understand and use Dyspnea scale  Yes       Intervention  Provide education and explanation on how to use Dyspnea scale       Expected Outcomes  Short Term: Able to use Dyspnea scale daily in rehab to express subjective sense of shortness of breath during exertion;Long Term: Able to use Dyspnea scale to guide intensity level when exercising independently       Knowledge and understanding of Target Heart Rate Range (THRR)  Yes       Intervention  Provide education and explanation of THRR including how the numbers were predicted and where they are located for reference       Expected Outcomes  Short Term: Able to state/look up THRR;Long Term: Able to use THRR  to govern intensity when exercising independently;Short Term: Able to use daily as guideline for intensity in rehab       Able to check pulse independently  Yes       Intervention  Provide education and demonstration on how to check pulse in carotid and radial arteries.;Review the importance of being able to check your own pulse for safety during  independent exercise       Expected Outcomes  Short Term: Able to explain why pulse checking is important during independent exercise;Long Term: Able to check pulse independently and accurately       Understanding of Exercise Prescription  Yes       Intervention  Provide education, explanation, and written materials on patient's individual exercise prescription       Expected Outcomes  Short Term: Able to explain program exercise prescription;Long Term: Able to explain home exercise prescription to exercise independently          Exercise Goals Re-Evaluation : Exercise Goals Re-Evaluation    Row Name 07/23/17 0857 07/30/17 1111 08/06/17 1438 08/13/17 1104       Exercise Goal Re-Evaluation   Exercise Goals Review  Knowledge and understanding of Target Heart Rate Range (THRR);Able to understand and use rate of perceived exertion (RPE) scale;Understanding of Exercise Prescription  Increase Physical Activity;Understanding of Exercise Prescription;Increase Strength and Stamina  Increase Physical Activity;Understanding of Exercise Prescription;Increase Strength and Stamina  Increase Physical Activity;Increase Strength and Stamina    Comments  Reviewed RPE scale, THR and program prescription with pt today.  Pt voiced understanding and was given a copy of goals to take home.  [name] is off to a good start in rehab.  She is not currently doing any exercise at home, but we will add this in soon.  She is excited about coming to class to get help and time to herself. She will benefit from the program in many ways from exercise to support!!  [name] has been doing well in rehab.  She enjoyed her thanksgiving holiday .  She is improved to 2.2 METs on the NuStep.  We will continue to monitor her progress.   Reviewed home exercise with pt today.  Pt plans to use facility where she lives for exercise.  Reviewed THR, pulse, RPE, sign and symptoms, NTG use, and when to call 911 or MD.  Also discussed weather considerations and  indoor options.  Pt voiced understanding.  [name] saw her Dr last week after having s/s - will get stress test results today.  Advised her to follow Dr orders about exercise and follow up with staff Thursday.    Expected Outcomes  Short: Use RPE daily to regulate intensity.  Long: Follow program prescription in THR.  Short: Review home exercise guidelines.  Long: Continue to attend classes regularly to work on strength and stamina.   Short: Review home exercise guidelines.  Long: Continue to attend classes regularly to work on strength and stamina.   Short - [name] will add exercise at home pending Dr clearance after stress test.  Long - [name] will exercise independently.       Discharge Exercise Prescription (Final Exercise Prescription Changes): Exercise Prescription Changes - 08/06/17 1400      Response to Exercise   Blood Pressure (Admit)  126/74    Blood Pressure (Exercise)  130/54    Blood Pressure (Exit)  122/64    Heart Rate (Admit)  84 bpm    Heart Rate (Exercise)  99 bpm  Heart Rate (Exit)  82 bpm    Rating of Perceived Exertion (Exercise)  13    Symptoms  none    Duration  Progress to 45 minutes of aerobic exercise without signs/symptoms of physical distress    Intensity  THRR unchanged      Progression   Progression  Continue to progress workloads to maintain intensity without signs/symptoms of physical distress.    Average METs  2.58      Resistance Training   Training Prescription  Yes    Weight  2 lbs    Reps  10-15      Interval Training   Interval Training  No      Treadmill   MPH  1.5    Grade  0    Minutes  15    METs  2.15      NuStep   Level  2    Minutes  15    METs  1.8      REL-XR   Level  2    Minutes  15    METs  2.2       Nutrition:  Target Goals: Understanding of nutrition guidelines, daily intake of sodium '1500mg'$ , cholesterol '200mg'$ , calories 30% from fat and 7% or less from saturated fats, daily to have 5 or more servings of fruits and  vegetables.  Biometrics: Pre Biometrics - 07/18/17 1427      Pre Biometrics   Height  5' 4.5" (1.638 m)    Weight  218 lb (98.9 kg)    Waist Circumference  40 inches    Hip Circumference  49.75 inches    Waist to Hip Ratio  0.8 %    BMI (Calculated)  36.86    Single Leg Stand  5.31 seconds        Nutrition Therapy Plan and Nutrition Goals: Nutrition Therapy & Goals - 07/18/17 1332      Intervention Plan   Intervention  Prescribe, educate and counsel regarding individualized specific dietary modifications aiming towards targeted core components such as weight, hypertension, lipid management, diabetes, heart failure and other comorbidities.    Expected Outcomes  Short Term Goal: A plan has been developed with personal nutrition goals set during dietitian appointment.;Short Term Goal: Understand basic principles of dietary content, such as calories, fat, sodium, cholesterol and nutrients.;Long Term Goal: Adherence to prescribed nutrition plan.       Nutrition Discharge: Rate Your Plate Scores: Nutrition Assessments - 07/18/17 1157      MEDFICTS Scores   Pre Score  89       Nutrition Goals Re-Evaluation: Nutrition Goals Re-Evaluation    Row Name 07/30/17 1120             Goals   Nutrition Goal  Make nutrition appointment after [place] would like to lose weight, but she has a lot going on and needs some help in order to focus on this.       Expected Outcome  Short: make appointment.  Long: Follow heart healthy diet          Nutrition Goals Discharge (Final Nutrition Goals Re-Evaluation): Nutrition Goals Re-Evaluation - 07/30/17 1120      Goals   Nutrition Goal  Make nutrition appointment after [place] would like to lose weight, but she has a lot going on and needs some help in order to focus on this.    Expected Outcome  Short: make appointment.  Long: Follow heart healthy diet       Psychosocial: Target Goals: Acknowledge  presence or absence of significant depression and/or stress, maximize coping skills, provide positive support system. Participant is able to verbalize types and ability to use techniques and skills needed for reducing stress and depression.   Initial Review & Psychosocial Screening: Initial Psych Review & Screening - 07/18/17 1332      Initial Review   Current issues with  Current Depression;History of Depression;Current Psychotropic Meds;Current Sleep Concerns;Current Stress Concerns    Source of Stress Concerns  Financial;Family    Comments  Husband at [facility] for Dementia. Had to file for Medicaid-fianances hard.     Lives alone.  Has great support from children      [place]?  Yes 2 daughters and 1 son      Barriers   Psychosocial barriers to participate in program  There are no identifiable barriers or psychosocial needs.;The patient should benefit from training in stress management and relaxation.      Screening Interventions   Interventions  Encouraged to exercise;To provide support and resources with identified psychosocial needs;Provide feedback about the scores to participant;Program counselor consult;Yes    Expected Outcomes  Long Term Goal: Stressors or current issues are controlled or eliminated.;Short Term goal: Utilizing psychosocial counselor, staff and physician to assist with identification of specific Stressors or current issues interfering with healing process. Setting desired goal for each stressor or current issue identified.;Short Term goal: Identification and review with participant of any Quality of Life or Depression concerns found by scoring the questionnaire.;Long Term goal: The participant improves quality of Life and PHQ9 Scores as seen by post scores and/or verbalization of changes Reviewed scores with [name]. Talked to her about how attending the program and taking steps to care for herself may help improve her scores.         Quality  of Life Scores:  Quality of Life - 07/18/17 1337      Quality of Life Scores   Health/Function Pre  10.18 %    Socioeconomic Pre  16.25 %    Psych/Spiritual Pre  10.1 %    Family Pre  15 %    GLOBAL Pre  11.78 %       PHQ-9: Recent Review Flowsheet Data    Depression screen [facility] 2/9 07/18/2017   Decreased Interest 0   Down, Depressed, Hopeless 0   PHQ - 2 Score 0   Altered sleeping 1    Tired, decreased energy 3   Change in appetite 2   Feeling bad or failure about yourself  0   Trouble concentrating 0   Moving slowly or fidgety/restless 0   Suicidal thoughts 0   PHQ-9 Score 6   Difficult doing work/chores Not difficult at all     Interpretation of Total Score  Total Score Depression Severity:  1-4 = Minimal depression, 5-9 = Mild depression, 10-14 = Moderate depression, 15-19 = Moderately severe depression, 20-27 = Severe depression   Psychosocial Evaluation and Intervention: Psychosocial Evaluation - 07/31/17 1720      Psychosocial Evaluation & Interventions   Interventions  Stress management education;Therapist referral;Encouraged to exercise with the program and follow exercise prescription;Relaxation education    Comments  Counselor met with Ms. Raliegh Ip [name]) for initial psychosocial evaluation.  She is a 71 year old who had a stent inserted in her left aorta several weeks ago.  [name] has a strong  support system with her two daughters and a son who live locally.  She has 6 grandchildren and 6 Great grandchildren.  [name] also has a spouse who has had dementia since 2010 and is currently in a nursing home.  [name] has struggled with her own health issues in addition to her heart condition, she is a breast cancer survivor for 3 years now cancer free.  She is tired a lot and is on new medications that are "too expensive" as [name] lives on a fixed and limited income.  She has a history of depression and anxiety and is on medications for this; but reports feeling "edgy often."  [name] states her  mood is "stable" but she was very tearful today discussing her stressors - particularly financial.  She has goals to be healthier; improve her diet and lose some weight while in this program.  Counselor provided some stress management strategies for [name] today and the class practiced mindfulness and relaxation altogether.  This counselor recommended [name] to see a therapist and she is concerned about the cost of this.  Counselor will look into options and get those to [place] next week.  She agreed to give each of her adult children something they can do to help alleviate some stress tomorrow when she sees them for Thanksgiving.  [name] is hesitant to allow others to know her needs and tries to be strong.  But it is time to allow others to help. She agreed to do so.  Counselor will follow with [name] re: this and overall while she is in this program.      Expected Outcomes  [name] will benefit from consistent exercise to achieve her stated goals.  She will meet with the dietician to address her weight loss goal.  [name] agreed to see a therapist as long as there is no co-pay involved.  Counselor will research and provide info on this next week.  [name] will begin sharing her needs with others more openly to help her through this stressful time.      Continue Psychosocial Services   Follow up required by counselor       Psychosocial Re-Evaluation: Psychosocial Re-Evaluation    Row Name 07/30/17 [phone] 08/06/17 0953           Psychosocial Re-Evaluation   Current issues with  Current Depression;Current Anxiety/Panic;Current Stress Concerns  Current Anxiety/Panic;History of Depression;Current Depression;Current Stress Concerns      Comments  [name] broke down today while talking about how she is doing.  Her husband is in [place] with dementia since 2010.  She only goes to see him once a week as it is too hard on her mentally to see him.  He has a minimal lucid moment and usally does not recognize her.  Their 73 year anniversary  was on Sunday and he was not aware at all.  This really hit her hard.  She is also stuggling financially with caring for him, paying for care, and paying for her medications.  I gave her contact information for the medication management clinic to get help with her medications.  She also really needs to talk with Juliann Pulse to establish care with a psychologist to work on her anxiety and depression.  She has symptoms of both.  Her anxiety has now reached a point of causing chest pain any time she gets upset over her husband or finances.  She is also stressed by juggling all of her medical care and appointments.  And to add to  it, she would like to remain strong for her family.  She has always been the strong matriarch of her family, she does not want to burden her kids but they want to know what is going on.  She got teary eyed while we discussing everything.  I encouraged her to get involved with her classmates and with heart sisters to establish a support system with similar circumstances.  She is excited about what we can do to help.  She would also like help to find a cheaper place to live to help with finances.  Counselor follow up today with [name].  She reports having a great thanksgiving and was in a better mood today - more relaxed.  Counselor provided contact information for a therapist for [name] and she appeared relieved to know there is someone to help her during this time.  She agreed to call today to set an appointment.  Counselor will continue to follow.        Expected Outcomes  Short: Meet with Juliann Pulse next week.  Long: Get help with anxiety and depression.   [name] will call the therapist to begin counseling services to help her through this stressful time in her life.        Interventions  -  Therapist referral;Stress management education      Continue Psychosocial Services   Follow up required by counselor  Follow up required by counselor      Comments  Husband at [facility] for Dementia. Had to file for  Medicaid-fianances hard.     Lives alone.  Has great support from children  -        Initial Review   Source of Stress Concerns  Chronic Illness;Unable to participate in former interests or hobbies;Family;Financial  -         Psychosocial Discharge (Final Psychosocial Re-Evaluation): Psychosocial Re-Evaluation - 08/06/17 0953      Psychosocial Re-Evaluation   Current issues with  Current Anxiety/Panic;History of Depression;Current Depression;Current Stress Concerns    Comments  Counselor follow up today with [name].  She reports having a great thanksgiving and was in a better mood today - more relaxed.  Counselor provided contact information for a therapist for [name] and she appeared relieved to know there is someone to help her during this time.  She agreed to call today to set an appointment.  Counselor will continue to follow.      Expected Outcomes  [name] will call the therapist to begin counseling services to help her through this stressful time in her life.      Interventions  Therapist referral;Stress management education    Continue Psychosocial Services   Follow up required by counselor       Vocational Rehabilitation: Provide vocational rehab assistance to qualifying candidates.   Vocational Rehab Evaluation & Intervention: Vocational Rehab - 07/18/17 1340      Initial Vocational Rehab Evaluation & Intervention   Assessment shows need for Vocational Rehabilitation  No       Education: Education Goals: Education classes will be provided on a variety of topics geared toward better understanding of heart health and risk factor modification. Participant will state understanding/return demonstration of topics presented as noted by education test scores.  Learning Barriers/Preferences: Learning Barriers/Preferences - 07/18/17 1339      Learning Barriers/Preferences   Learning Barriers  None    Learning Preferences  Individual Instruction;Verbal Instruction       Education  Topics: General Nutrition Guidelines/Fats and Fiber: -Group instruction provided  by verbal, written material, models and posters to present the general guidelines for heart healthy nutrition. Gives an explanation and review of dietary fats and fiber.   Controlling Sodium/Reading Food Labels: -Group verbal and written material supporting the discussion of sodium use in heart healthy nutrition. Review and explanation with models, verbal and written materials for utilization of the food label.   Exercise Physiology & Risk Factors: - Group verbal and written instruction with models to review the exercise physiology of the cardiovascular system and associated critical values. Details cardiovascular disease risk factors and the goals associated with each risk factor.   Aerobic Exercise & Resistance Training: - Gives group verbal and written discussion on the health impact of inactivity. On the components of aerobic and resistive training programs and the benefits of this training and how to safely progress through these programs.   Flexibility, Balance, General Exercise Guidelines: - Provides group verbal and written instruction on the benefits of flexibility and balance training programs. Provides general exercise guidelines with specific guidelines to those with heart or lung disease. Demonstration and skill practice provided.   Stress Management: - Provides group verbal and written instruction about the health risks of elevated stress, cause of high stress, and healthy ways to reduce stress.   Cardiac Rehab from 08/13/2017 in [facility] Cardiac and Pulmonary Rehab  Date  07/23/17  Educator  [facility]  Instruction Review Code  1- Verbalizes Understanding      Depression: - Provides group verbal and written instruction on the correlation between heart/lung disease and depressed mood, treatment options, and the stigmas associated with seeking treatment.   Anatomy & Physiology of the Heart: - Group  verbal and written instruction and models provide basic cardiac anatomy and physiology, with the coronary electrical and arterial systems. Review of: AMI, Angina, Valve disease, Heart Failure, Cardiac Arrhythmia, Pacemakers, and the ICD.   Cardiac Rehab from 08/13/2017 in [facility] Cardiac and Pulmonary Rehab  Date  07/25/17  Educator  [place]  Instruction Review Code  1- Verbalizes Understanding      Cardiac Procedures: - Group verbal and written instruction to review commonly prescribed medications for heart disease. Reviews the medication, class of the drug, and side effects. Includes the steps to properly store meds and maintain the prescription regimen. (beta blockers and nitrates)   Cardiac Rehab from 08/13/2017 in [facility] Cardiac and Pulmonary Rehab  Date  07/30/17  Educator  [place]  Instruction Review Code  1- Verbalizes Understanding      Cardiac Medications I: - Group verbal and written instruction to review commonly prescribed medications for heart disease. Reviews the medication, class of the drug, and side effects. Includes the steps to properly store meds and maintain the prescription regimen.   Cardiac Rehab from 08/13/2017 in [facility] Cardiac and Pulmonary Rehab  Date  08/06/17  Educator  [place]  Instruction Review Code  1- Verbalizes Understanding      Cardiac Medications II: -Group verbal and written instruction to review commonly prescribed medications for heart disease. Reviews the medication, class of the drug, and side effects. (all other drug classes)    Go Sex-Intimacy & Heart Disease, Get SMART - Goal Setting: - Group verbal and written instruction through game format to discuss heart disease and the return to sexual intimacy. Provides group verbal and written material to discuss and apply goal setting through the application of the S.M.A.R.T. Method.   Cardiac Rehab from 08/13/2017 in [facility] Cardiac and Pulmonary Rehab  Date  07/30/17  Educator  SB  Instruction Review Code  1-  Verbalizes Understanding      Other Matters of the Heart: - Provides group verbal, written materials and models to describe Heart Failure, Angina, Valve Disease, Peripheral Artery Disease, and Diabetes in the realm of heart disease. Includes description of the disease process and treatment options available to the cardiac patient.   Cardiac Rehab from 08/13/2017 in [facility] Cardiac and Pulmonary Rehab  Date  07/25/17  Educator  [place]  Instruction Review Code  1- Verbalizes Understanding      Exercise & Equipment Safety: - Individual verbal instruction and demonstration of equipment use and safety with use of the equipment.   Cardiac Rehab from 08/13/2017 in [facility] Cardiac and Pulmonary Rehab  Date  07/18/17  Educator  [facility]  Instruction Review Code  1- Verbalizes Understanding      Infection Prevention: - Provides verbal and written material to individual with discussion of infection control including proper hand washing and proper equipment cleaning during exercise session.   Cardiac Rehab from 08/13/2017 in [facility] Cardiac and Pulmonary Rehab  Date  07/18/17  Educator  [facility]  Instruction Review Code  1- Verbalizes Understanding      Falls Prevention: - Provides verbal and written material to individual with discussion of falls prevention and safety.   Cardiac Rehab from 08/13/2017 in [facility] Cardiac and Pulmonary Rehab  Date  07/18/17  Educator  [facility]  Instruction Review Code  1- Verbalizes Understanding      Diabetes: - Individual verbal and written instruction to review signs/symptoms of diabetes, desired ranges of glucose level fasting, after meals and with exercise. Acknowledge that pre and post exercise glucose checks will be done for 3 sessions at entry of program.   Cardiac Rehab from 08/13/2017 in [facility] Cardiac and Pulmonary Rehab  Date  07/18/17  Educator  Sb  Instruction Review Code  1- Verbalizes Understanding      Other: -Provides group and verbal instruction on various topics (see  comments)    Knowledge Questionnaire Score: Knowledge Questionnaire Score - 07/23/17 0855      Knowledge Questionnaire Score   Pre Score  24/28 reviewed with pt today       Core Components/Risk Factors/Patient Goals at Admission: Personal Goals and Risk Factors at Admission - 07/18/17 1341      Core Components/Risk Factors/Patient Goals on Admission    Weight Management  Yes;Obesity;Weight Loss    Intervention  Weight Management: Develop a combined nutrition and exercise program designed to reach desired caloric intake, while maintaining appropriate intake of nutrient and fiber, sodium and fats, and appropriate energy expenditure required for the weight goal.;Weight Management/Obesity: Establish reasonable short term and long term weight goals.;Obesity: Provide education and appropriate resources to help participant work on and attain dietary goals.    Admit Weight  218 lb (98.9 kg)    Goal Weight: Short Term  215 lb (97.5 kg)    Goal Weight: Long Term  150 lb (68 kg)    Expected Outcomes  Short Term: Continue to assess and modify interventions until short term weight is achieved;Long Term: Adherence to nutrition and physical activity/exercise program aimed toward attainment of established weight goal;Weight Loss: Understanding of general recommendations for a balanced deficit meal plan, which promotes 1-2 lb weight loss per week and includes a negative energy balance of [phone] kcal/d    Diabetes  Yes    Intervention  Provide education about signs/symptoms and action to take for hypo/hyperglycemia.;Provide education about proper nutrition, including hydration, and aerobic/resistive  exercise prescription along with prescribed medications to achieve blood glucose in normal ranges: Fasting glucose 65-99 mg/dL    Expected Outcomes  Short Term: Participant verbalizes understanding of the signs/symptoms and immediate care of hyper/hypoglycemia, proper foot care and importance of medication,  aerobic/resistive exercise and nutrition plan for blood glucose control.;Long Term: Attainment of HbA1C < 7%.    Hypertension  Yes    Intervention  Provide education on lifestyle modifcations including regular physical activity/exercise, weight management, moderate sodium restriction and increased consumption of fresh fruit, vegetables, and low fat dairy, alcohol moderation, and smoking cessation.;Monitor prescription use compliance.    Expected Outcomes  Short Term: Continued assessment and intervention until BP is < 140/65m HG in hypertensive participants. < 130/836mHG in hypertensive participants with diabetes, heart failure or chronic kidney disease.;Long Term: Maintenance of blood pressure at goal levels.    Lipids  Yes    Intervention  Provide education and support for participant on nutrition & aerobic/resistive exercise along with prescribed medications to achieve LDL '70mg'$ , HDL >'40mg'$ .    Expected Outcomes  Short Term: Participant states understanding of desired cholesterol values and is compliant with medications prescribed. Participant is following exercise prescription and nutrition guidelines.;Long Term: Cholesterol controlled with medications as prescribed, with individualized exercise RX and with personalized nutrition plan. Value goals: LDL < '70mg'$ , HDL > 40 mg.    Stress  Yes    Intervention  Offer individual and/or small group education and counseling on adjustment to heart disease, stress management and health-related lifestyle change. Teach and support self-help strategies.;Refer participants experiencing significant psychosocial distress to appropriate mental health specialists for further evaluation and treatment. When possible, include family members and significant others in education/counseling sessions.    Expected Outcomes  Short Term: Participant demonstrates changes in health-related behavior, relaxation and other stress management skills, ability to obtain effective social  support, and compliance with psychotropic medications if prescribed.;Long Term: Emotional wellbeing is indicated by absence of clinically significant psychosocial distress or social isolation.       Core Components/Risk Factors/Patient Goals Review:  Goals and Risk Factor Review    Row Name 07/30/17 0905 07/30/17 1113           Core Components/Risk Factors/Patient Goals Review   Personal Goals Review  -  Weight Management/Obesity;Hypertension;Lipids;Diabetes      Review  -  AnLelon Frohlichas been doing well in rehab.  Her weight was up today, but she has been stress eating.  Her blood pressures and blood sugars have been good.  She checks them at home routinely and tracks them for her doctor.  She feels her medications are working for her for the most part. She has a lot of stress and anxiety which tirggers chest pain.  She is going to talk with KaJuliann Pulsebout this too.      Expected Outcomes  -  Short: Continue to work on weight loss and meet to talk about stress and aniexty.  Long: Continue to monitor and work on risk factor modifications.          Core Components/Risk Factors/Patient Goals at Discharge (Final Review):  Goals and Risk Factor Review - 07/30/17 1113      Core Components/Risk Factors/Patient Goals Review   Personal Goals Review  Weight Management/Obesity;Hypertension;Lipids;Diabetes    Review  AnLelon Frohlichas been doing well in rehab.  Her weight was up today, but she has been stress eating.  Her blood pressures and blood sugars have been good.  She checks them at home  routinely and tracks them for her doctor.  She feels her medications are working for her for the most part. She has a lot of stress and anxiety which tirggers chest pain.  She is going to talk with Juliann Pulse about this too.    Expected Outcomes  Short: Continue to work on weight loss and meet to talk about stress and aniexty.  Long: Continue to monitor and work on risk factor modifications.        ITP Comments: ITP Comments    Row  Name 07/18/17 1325 08/08/17 0923 08/14/17 0606       ITP Comments  medical review completed.  Initial ITP sent to Dr Elta Guadeloupe Sabra Heck for review, necessary changes and cosign.  Diagnosis documentation can be found in [facility]10/26/2018  [name] mentioned that she has had some "burning" in chest and neck similar to symptoms before her event.  She slowed down and the sensation went away.  She was advised to call her cardiologist today.  She saw him Monday but they did not discuss this symptom.  30 day review. Continue with ITP unless directed changes per Medical Director review.         Comments:

## 2017-08-22 DIAGNOSIS — Z955 Presence of coronary angioplasty implant and graft: Secondary | ICD-10-CM

## 2017-08-22 NOTE — Progress Notes (Signed)
Daily Session Note  Patient Details  Name: Tammy Hatfield MRN: [id number] Date of Birth: 1946-03-09 Referring Provider:     Cardiac Rehab from 07/18/2017 in [facility] Cardiac and Pulmonary Rehab  Referring Provider  khan      Encounter Date: 08/22/2017  Check In: Session Check In - 08/22/17 1004      Check-In   Location  [facility]-Cardiac & Pulmonary Rehab    Staff Present  Gerlene Burdock, RN, Levie Heritage, MA, ACSM RCEP, Exercise Physiologist;Joseph Foy [place], [place], ACSM CEP, Exercise Physiologist    Supervising physician immediately available to respond to emergencies  See telemetry face sheet for immediately available ER MD    Medication changes reported      Yes    Comments  Stop Omeprazole, start pantoprazol 20 mg    Fall or balance concerns reported     No    Warm-up and Cool-down  Performed on first and last piece of equipment    Resistance Training Performed  Yes      Pain Assessment   Currently in Pain?  No/denies    Multiple Pain Sites  No        Exercise Prescription Changes - 08/21/17 1400      Response to Exercise   Blood Pressure (Admit)  120/62    Blood Pressure (Exercise)  124/72    Blood Pressure (Exit)  134/70    Heart Rate (Admit)  63 bpm    Heart Rate (Exercise)  97 bpm    Heart Rate (Exit)  74 bpm    Rating of Perceived Exertion (Exercise)  13    Symptoms  none    Duration  Continue with 45 min of aerobic exercise without signs/symptoms of physical distress.    Intensity  THRR unchanged      Progression   Progression  Continue to progress workloads to maintain intensity without signs/symptoms of physical distress.    Average METs  2.05      Resistance Training   Training Prescription  Yes    Weight  2 lbs    Reps  10-15      Interval Training   Interval Training  No      Treadmill   MPH  1.5    Grade  0    Minutes  15    METs  2.15      NuStep   Level  2    Minutes  15    METs  1.8      REL-XR   Level  2     Minutes  15    METs  2.2      Home Exercise Plan   Plans to continue exercise at  Home (comment) walking and gym in complex    Frequency  Add 2 additional days to program exercise sessions.    Initial Home Exercises Provided  08/13/17       Social History   Tobacco Use  Smoking Status Former Smoker  . Packs/day: 2.00  . Years: 30.00  . Pack years: 60.00  . Last attempt to quit: 02/01/2000  . Years since quitting: 17.5  Smokeless Tobacco Never Used  Tobacco Comment   07/18/2017 former smoker quit 1999    Goals Met:  Independence with exercise equipment Exercise tolerated well No report of cardiac concerns or symptoms Strength training completed today  Goals Unmet:  Not Applicable  Comments: Pt able to follow exercise prescription today without complaint.  Will continue to monitor for  progression.    Dr. Emily Filbert is Medical Director for [place] and [facility].

## 2017-08-23 DIAGNOSIS — K602 Anal fissure, unspecified: Secondary | ICD-10-CM | POA: Insufficient documentation

## 2017-08-23 DIAGNOSIS — Z8719 Personal history of other diseases of the digestive system: Secondary | ICD-10-CM | POA: Insufficient documentation

## 2017-08-23 DIAGNOSIS — K5909 Other constipation: Secondary | ICD-10-CM | POA: Insufficient documentation

## 2017-08-27 ENCOUNTER — Encounter: Payer: Medicare Other | Admitting: *Deleted

## 2017-08-27 DIAGNOSIS — Z955 Presence of coronary angioplasty implant and graft: Secondary | ICD-10-CM | POA: Diagnosis not present

## 2017-08-27 NOTE — Progress Notes (Signed)
Daily Session Note  Patient Details  Name: Danija A Riedinger MRN: [id number] Date of Birth: 01/18/1946 Referring Provider:     Cardiac Rehab from 07/18/2017 in [facility] Cardiac and Pulmonary Rehab  Referring Provider  khan      Encounter Date: 08/27/2017  Check In: Session Check In - 08/27/17 0831      Check-In   Location  [facility]-Cardiac & Pulmonary Rehab    Staff Present  Amanda Sommer, BA, ACSM CEP, Exercise Physiologist;Susanne Bice, RN, BSN, CCRP;Jessica Hawkins, MA, ACSM RCEP, Exercise Physiologist    Supervising physician immediately available to respond to emergencies  See telemetry face sheet for immediately available ER MD    Medication changes reported      No    Fall or balance concerns reported     No    Warm-up and Cool-down  Performed on first and last piece of equipment    Resistance Training Performed  Yes    VAD Patient?  No      Pain Assessment   Currently in Pain?  No/denies          Social History   Tobacco Use  Smoking Status Former Smoker  . Packs/day: 2.00  . Years: 30.00  . Pack years: 60.00  . Last attempt to quit: 02/01/2000  . Years since quitting: 17.5  Smokeless Tobacco Never Used  Tobacco Comment   07/18/2017 former smoker quit 1999    Goals Met:  Independence with exercise equipment Exercise tolerated well No report of cardiac concerns or symptoms Strength training completed today  Goals Unmet:  Not Applicable  Comments: Pt able to follow exercise prescription today without complaint.  Will continue to monitor for progression.    Dr. Mark Miller is Medical Director for [facility] and [facility]. 

## 2017-08-29 DIAGNOSIS — Z955 Presence of coronary angioplasty implant and graft: Secondary | ICD-10-CM | POA: Diagnosis not present

## 2017-08-29 NOTE — Progress Notes (Signed)
Daily Session Note  Patient Details  Name: Tammy Hatfield MRN: [id number] Date of Birth: [birth year] Referring Provider:     Cardiac Rehab from 07/18/2017 in [facility] Cardiac and Pulmonary Rehab  Referring Provider  khan      Encounter Date: 08/29/2017  Check In: Session Check In - 08/29/17 0908      Check-In   Location  [facility]-Cardiac & Pulmonary Rehab    Staff Present  Nada Maclachlan, BA, ACSM CEP, Exercise Physiologist;Carroll Enterkin, RN, Levie Heritage, MA, ACSM RCEP, Exercise Physiologist    Supervising physician immediately available to respond to emergencies  See telemetry face sheet for immediately available ER MD    Medication changes reported      No    Fall or balance concerns reported     No    Warm-up and Cool-down  Performed on first and last piece of equipment    Resistance Training Performed  Yes    VAD Patient?  No      Pain Assessment   Currently in Pain?  No/denies    Multiple Pain Sites  No          Social History   Tobacco Use  Smoking Status Former Smoker  . Packs/day: 2.00  . Years: 30.00  . Pack years: 60.00  . Last attempt to quit: 02/01/2000  . Years since quitting: 17.5  Smokeless Tobacco Never Used  Tobacco Comment   07/18/2017 former smoker quit 1999    Goals Met:  Independence with exercise equipment Exercise tolerated well No report of cardiac concerns or symptoms Strength training completed today  Goals Unmet:  Not Applicable  Comments: Pt able to follow exercise prescription today without complaint.  Will continue to monitor for progression.    Dr. Emily Filbert is Medical Director for [place] and [facility].

## 2017-09-02 ENCOUNTER — Encounter: Payer: Medicare Other | Admitting: *Deleted

## 2017-09-02 DIAGNOSIS — Z955 Presence of coronary angioplasty implant and graft: Secondary | ICD-10-CM

## 2017-09-02 NOTE — Progress Notes (Signed)
Daily Session Note  Patient Details  Name: Tammy Hatfield MRN: [id number] Date of Birth: 1945/12/23 Referring Provider:     Cardiac Rehab from 07/18/2017 in [facility] Cardiac and Pulmonary Rehab  Referring Provider  khan      Encounter Date: 09/02/2017  Check In: Session Check In - 09/02/17 5329      Check-In   Location  [facility]-Cardiac & Pulmonary Rehab    Staff Present  Nada Maclachlan, BA, ACSM CEP, Exercise Physiologist;Nessie Nong Luan Pulling, MA, ACSM RCEP, Exercise Physiologist;Diane [facility] RN,BSN    Supervising physician immediately available to respond to emergencies  See telemetry face sheet for immediately available ER MD    Medication changes reported      No    Fall or balance concerns reported     No    Warm-up and Cool-down  Performed on first and last piece of equipment    Resistance Training Performed  Yes    VAD Patient?  No      Pain Assessment   Currently in Pain?  No/denies          Social History   Tobacco Use  Smoking Status Former Smoker  . Packs/day: 2.00  . Years: 30.00  . Pack years: 60.00  . Last attempt to quit: 02/01/2000  . Years since quitting: 17.5  Smokeless Tobacco Never Used  Tobacco Comment   07/18/2017 former smoker quit 1999    Goals Met:  Independence with exercise equipment Exercise tolerated well No report of cardiac concerns or symptoms Strength training completed today  Goals Unmet:  Not Applicable  Comments: Pt able to follow exercise prescription today without complaint.  Will continue to monitor for progression.    Dr. Emily Filbert is Medical Director for [place] and [facility].

## 2017-09-05 DIAGNOSIS — Z955 Presence of coronary angioplasty implant and graft: Secondary | ICD-10-CM

## 2017-09-05 NOTE — Progress Notes (Signed)
Daily Session Note  Patient Details  Name: Tammy Hatfield MRN: [id number] Date of Birth: 05/08/1946 Referring Provider:     Cardiac Rehab from 07/18/2017 in [facility]  Referring Provider  khan      Encounter Date: 09/05/2017  Check In: Session Check In - 09/05/17 1215      Check-In   Location  [facility]    Staff Present  Gerlene Burdock, RN, BSN;Jessica Luan Pulling, MA, ACSM RCEP, Exercise Physiologist;Ayme Short Oletta Darter, [place], ACSM CEP, Exercise Physiologist    Supervising physician immediately available to respond to emergencies  See telemetry face sheet for immediately available ER MD    Medication changes reported      No    Fall or balance concerns reported     No    Warm-up and Cool-down  Performed on first and last piece of equipment    Resistance Training Performed  Yes      Pain Assessment   Currently in Pain?  No/denies    Multiple Pain Sites  No          Social History   Tobacco Use  Smoking Status Former Smoker  . Packs/day: 2.00  . Years: 30.00  . Pack years: 60.00  . Last attempt to quit: 02/01/2000  . Years since quitting: 17.6  Smokeless Tobacco Never Used  Tobacco Comment   07/18/2017 former smoker quit 1999    Goals Met:  Independence with exercise equipment Exercise tolerated well No report of cardiac concerns or symptoms Strength training completed today  Goals Unmet:  Not Applicable  Comments: Pt able to follow exercise prescription today without complaint.  Will continue to monitor for progression.    Dr. Emily Filbert is Medical Director for [place] and [facility].

## 2017-09-11 ENCOUNTER — Encounter: Payer: Self-pay | Admitting: *Deleted

## 2017-09-11 DIAGNOSIS — Z955 Presence of coronary angioplasty implant and graft: Secondary | ICD-10-CM

## 2017-09-11 NOTE — Progress Notes (Signed)
Cardiac Individual Treatment Plan  Patient Details  Name: Tammy Hatfield MRN: [id number] Date of Birth: 12-Jan-1946 Referring Provider:     Cardiac Rehab from 07/18/2017 in [facility] Cardiac and Pulmonary Rehab  Referring Provider  khan      Initial Encounter Date:    Cardiac Rehab from 07/18/2017 in [facility] Cardiac and Pulmonary Rehab  Date  07/18/17  Referring Provider  khan      Visit Diagnosis: Status post coronary artery stent placement  Patient's Home Medications on Admission:  Current Outpatient Medications:  .  albuterol (PROVENTIL HFA;VENTOLIN HFA) 108 (90 BASE) MCG/ACT inhaler, Inhale 2 puffs into the lungs every 4 (four) hours as needed for wheezing or shortness of breath., Disp: , Rfl:  .  ALPRAZolam (XANAX) 0.5 MG tablet, Take 0.5 mg by mouth 2 (two) times daily as needed for anxiety., Disp: , Rfl:  .  aspirin 81 MG chewable tablet, Chew 1 tablet (81 mg total) by mouth daily., Disp: , Rfl:  .  glipiZIDE (GLUCOTROL) 10 MG tablet, Take 10 mg by mouth daily before breakfast., Disp: , Rfl:  .  insulin NPH-regular Human (NOVOLIN 70/30) (70-30) 100 UNIT/ML injection, Inject 15-25 Units into the skin 2 (two) times daily with a meal. , Disp: , Rfl:  .  isosorbide mononitrate (IMDUR) 30 MG 24 hr tablet, Take 1 tablet (30 mg total) by mouth daily., Disp: 30 tablet, Rfl: 0 .  loratadine (CLARITIN) 10 MG tablet, Take 10 mg by mouth daily as needed. , Disp: , Rfl:  .  losartan (COZAAR) 50 MG tablet, Take 50 mg by mouth every morning. , Disp: , Rfl:  .  metoprolol succinate (TOPROL-XL) 25 MG 24 hr tablet, , Disp: , Rfl:  .  metoprolol tartrate (LOPRESSOR) 25 MG tablet, Take 1 tablet (25 mg total) by mouth 2 (two) times daily., Disp: 60 tablet, Rfl: 0 .  nitroGLYCERIN (NITROSTAT) 0.4 MG SL tablet, Place 1 tablet (0.4 mg total) under the tongue every 5 (five) minutes as needed for chest pain., Disp: 30 tablet, Rfl: 0 .  omeprazole (PRILOSEC) 20 MG capsule, Take 20 mg by mouth 2 (two) times  daily. , Disp: , Rfl:  .  PARoxetine (PAXIL) 40 MG tablet, Take 20 mg by mouth 2 (two) times daily. , Disp: , Rfl:  .  ticagrelor (BRILINTA) 90 MG TABS tablet, Take 1 tablet (90 mg total) by mouth 2 (two) times daily., Disp: 60 tablet, Rfl: 0 .  traMADol (ULTRAM) 50 MG tablet, Take 50 mg by mouth every 6 (six) hours as needed for moderate pain. , Disp: , Rfl:  .  valACYclovir (VALTREX) 500 MG tablet, TAKE ONE CAPLET BY MOUTH TWICE DAILY FOR 3 DAYS AS NEEDED FOR  FLARE, Disp: , Rfl:   Past Medical History: Past Medical History:  Diagnosis Date  . Arthritis    knees  . Barrett's esophagus   . Breast cancer ([place]) 2014   RT LUMPECTOMY  . Cancer [facility]) 2014   right breast - s\p Radiation and lumpectomy and LN dissection  . Chronic sinusitis   . COPD (chronic obstructive pulmonary disease) ([place]) 2012  . Degenerative lumbar disc   . Diabetes mellitus without complication ([place])   . Dysphagia   . Esophageal reflux   . History of Helicobacter pylori infection   . Hypercholesteremia   . Hyperlipidemia   . Hypertension   . Personal history of radiation therapy   . Radiation 2014   RT BREAST CA  . Ulcer, stomach peptic  history of    Tobacco Use: Social History   Tobacco Use  Smoking Status Former Smoker  . Packs/day: 2.00  . Years: 30.00  . Pack years: 60.00  . Last attempt to quit: 02/01/2000  . Years since quitting: 17.6  Smokeless Tobacco Never Used  Tobacco Comment   07/18/2017 former smoker quit 1999    Labs: Recent Review Flowsheet Data    Labs for ITP Cardiac and Pulmonary Rehab Latest Ref Rng & Units 02/11/2015 07/05/2017   Cholestrol 0 - 200 mg/dL - 255(H)   LDLCALC 0 - 99 mg/dL - 147(H)   HDL >40 mg/dL - 62   Trlycerides <150 mg/dL - 231(H)   Hemoglobin A1c 4.0 - 6.0 % 7.4(H) -       Exercise Target Goals:    Exercise Program Goal: Individual exercise prescription set with THRR, safety & activity barriers. Participant demonstrates ability to understand and  report RPE using BORG scale, to self-measure pulse accurately, and to acknowledge the importance of the exercise prescription.  Exercise Prescription Goal: Starting with aerobic activity 30 plus minutes a day, 3 days per week for initial exercise prescription. Provide home exercise prescription and guidelines that participant acknowledges understanding prior to discharge.  Activity Barriers & Risk Stratification: Activity Barriers & Cardiac Risk Stratification - 07/18/17 1331      Activity Barriers & Cardiac Risk Stratification   Activity Barriers  Arthritis;Back Problems;Chest Pain/Angina;History of Falls;Joint Problems BOth knees "bone on bone"  needs surgery. Disc disease, bulging disc, arthritis all over.  Angina with exertion, History of falls because of "knees going out"    Cardiac Risk Stratification  High       6 Minute Walk: 6 Minute Walk    Row Name 07/18/17 1429         6 Minute Walk   Distance  912 feet     Walk Time  6 minutes     # of Rest Breaks  0     MPH  1.72     METS  1.8     RPE  14     Perceived Dyspnea   3     VO2 Peak  6.3     Symptoms  No     Resting HR  68 bpm     Resting BP  118/62     Resting Oxygen Saturation   95 %     Exercise Oxygen Saturation  during 6 min walk  95 %     Max Ex. HR  100 bpm     Max Ex. BP  156/66     2 Minute Post BP  130/64        Oxygen Initial Assessment:   Oxygen Re-Evaluation:   Oxygen Discharge (Final Oxygen Re-Evaluation):   Initial Exercise Prescription: Initial Exercise Prescription - 07/18/17 1400      Date of Initial Exercise RX and Referring Provider   Date  07/18/17    Referring Provider  khan      Treadmill   MPH  1.5    Grade  0    Minutes  15    METs  1.8      Recumbant Bike   Level  1    RPM  60    Watts  5    Minutes  15    METs  1.8      NuStep   Level  2    SPM  80    Minutes  15  METs  1.8      REL-XR   Level  1    Speed  50    Minutes  15    METs  1.8       Prescription Details   Frequency (times per week)  3    Duration  Progress to 45 minutes of aerobic exercise without signs/symptoms of physical distress      Intensity   THRR 40-80% of Max Heartrate  100-132    Ratings of Perceived Exertion  11-13    Perceived Dyspnea  0-4      Resistance Training   Training Prescription  Yes    Weight  2 lbs    Reps  10-15       Perform Capillary Blood Glucose checks as needed.  Exercise Prescription Changes: Exercise Prescription Changes    Row Name 07/18/17 1300 07/23/17 1500 08/06/17 1400 08/21/17 1400 09/02/17 1300     Response to Exercise   Blood Pressure (Admit)  118/62  110/62  126/74  120/62  134/76   Blood Pressure (Exercise)  156/66  146/72  130/54  124/72  146/64   Blood Pressure (Exit)  130/64  136/76  122/64  134/70  92/52   Heart Rate (Admit)  72 bpm  87 bpm  84 bpm  63 bpm  84 bpm   Heart Rate (Exercise)  100 bpm  105 bpm  99 bpm  97 bpm  112 bpm   Heart Rate (Exit)  71 bpm  79 bpm  82 bpm  74 bpm  81 bpm   Rating of Perceived Exertion (Exercise)  _0 Symptoms  -  none  none  none  none   Comments  -  first full day of exercise  -  -  -   Duration  -  Progress to 45 minutes of aerobic exercise without signs/symptoms of physical distress  Progress to 45 minutes of aerobic exercise without signs/symptoms of physical distress  Continue with 45 min of aerobic exercise without signs/symptoms of physical distress.  Continue with 45 min of aerobic exercise without signs/symptoms of physical distress.   Intensity  -  THRR unchanged  THRR unchanged  THRR unchanged  THRR unchanged     Progression   Progression  -  Continue to progress workloads to maintain intensity without signs/symptoms of physical distress.  Continue to progress workloads to maintain intensity without signs/symptoms of physical distress.  Continue to progress workloads to maintain intensity without signs/symptoms of physical distress.  Continue to  progress workloads to maintain intensity without signs/symptoms of physical distress.   Average METs  -  1.98  2.05  2.05  2.48     Resistance Training   Training Prescription  -  Yes  Yes  Yes  Yes   Weight  -  2 lbs  2 lbs  2 lbs  2 lbs   Reps  -  10-15  10-15  10-15  10-15     Interval Training   Interval Training  -  No  No  No  No     Treadmill   MPH  -  1.5  1.5  1.5  1.5   Grade  -  0  0  0  0   Minutes  -  _1 METs  -  1.8  2.15  2.15  2.15     NuStep  Level  -  -  _0 Minutes  -  -  _1 METs  -  -  1.8  1.8  2.5     REL-XR   Level  -  _2 Minutes  -  _3 METs  -  1.8  2.2  2.2  2.8     Home Exercise Plan   Plans to continue exercise at  -  -  -  Home (comment) walking and gym in complex  Home (comment) walking and gym in complex   Frequency  -  -  -  Add 2 additional days to program exercise sessions.  Add 2 additional days to program exercise sessions.   Initial Home Exercises Provided  -  -  -  08/13/17  08/13/17      Exercise Comments: Exercise Comments    Row Name 07/23/17 0858 08/08/17 0921 08/13/17 1104       Exercise Comments  First full day of exercise!  Patient was oriented to gym and equipment including functions, settings, policies, and procedures.  Patient's individual exercise prescription and treatment plan were reviewed.  All starting workloads were established based on the results of the 6 minute walk test done at initial orientation visit.  The plan for exercise progression was also introduced and progression will be customized based on patient's performance and goals.  Tammy Hatfield mentioned that she has had some "burning" in chest and neck similar to symptoms before her event.  She slowed down and the sensation went away.  She was advised to call her cardiologist today.  She saw him Monday but they did not discuss this symptom.   Reviewed home exercise with pt today.  Pt plans to use facility where she lives  for exercise.  Reviewed THR, pulse, RPE, sign and symptoms, NTG use, and when to call 911 or MD.  Also discussed weather considerations and indoor options.  Pt voiced understanding.  [name] saw her Dr last week after having s/s - will get stress test results today.  Advised her to follow Dr orders about exercise and follow up with staff Thursday.        Exercise Goals and Review: Exercise Goals    Row Name 07/18/17 1427             Exercise Goals   Intervention  Provide advice, education, support and counseling about physical activity/exercise needs.;Develop an individualized exercise prescription for aerobic and resistive training based on initial evaluation findings, risk stratification, comorbidities and participant's personal goals.       Expected Outcomes  Achievement of increased cardiorespiratory fitness and enhanced flexibility, muscular endurance and strength shown through measurements of functional capacity and personal statement of participant.       Increase Strength and Stamina  Yes       Intervention  Provide advice, education, support and counseling about physical activity/exercise needs.;Develop an individualized exercise prescription for aerobic and resistive training based on initial evaluation findings, risk stratification, comorbidities and participant's personal goals.       Expected Outcomes  Achievement of increased cardiorespiratory fitness and enhanced flexibility, muscular endurance and strength shown through measurements of functional capacity and personal statement of participant.       Able to understand and use rate of perceived exertion (RPE) scale  Yes       Intervention  Provide education and explanation on how to use RPE scale       Expected Outcomes  Short Term: Able to use RPE daily in rehab to express subjective intensity level;Long Term:  Able to use RPE to guide intensity level when exercising independently       Able to understand and use Dyspnea scale  Yes        Intervention  Provide education and explanation on how to use Dyspnea scale       Expected Outcomes  Short Term: Able to use Dyspnea scale daily in rehab to express subjective sense of shortness of breath during exertion;Long Term: Able to use Dyspnea scale to guide intensity level when exercising independently       Knowledge and understanding of Target Heart Rate Range (THRR)  Yes       Intervention  Provide education and explanation of THRR including how the numbers were predicted and where they are located for reference       Expected Outcomes  Short Term: Able to state/look up THRR;Long Term: Able to use THRR to govern intensity when exercising independently;Short Term: Able to use daily as guideline for intensity in rehab       Able to check pulse independently  Yes       Intervention  Provide education and demonstration on how to check pulse in carotid and radial arteries.;Review the importance of being able to check your own pulse for safety during independent exercise       Expected Outcomes  Short Term: Able to explain why pulse checking is important during independent exercise;Long Term: Able to check pulse independently and accurately       Understanding of Exercise Prescription  Yes       Intervention  Provide education, explanation, and written materials on patient's individual exercise prescription       Expected Outcomes  Short Term: Able to explain program exercise prescription;Long Term: Able to explain home exercise prescription to exercise independently          Exercise Goals Re-Evaluation : Exercise Goals Re-Evaluation    Row Name 07/23/17 0857 07/30/17 1111 08/06/17 1438 08/13/17 1104 08/21/17 1446     Exercise Goal Re-Evaluation   Exercise Goals Review  Knowledge and understanding of Target Heart Rate Range (THRR);Able to understand and use rate of perceived exertion (RPE) scale;Understanding of Exercise Prescription  Increase Physical Activity;Understanding of  Exercise Prescription;Increase Strength and Stamina  Increase Physical Activity;Understanding of Exercise Prescription;Increase Strength and Stamina  Increase Physical Activity;Increase Strength and Stamina  Increase Physical Activity;Increase Strength and Stamina;Understanding of Exercise Prescription   Comments  Reviewed RPE scale, THR and program prescription with pt today.  Pt voiced understanding and was given a copy of goals to take home.  Tammy Hatfield is off to a good start in rehab.  She is not currently doing any exercise at home, but we will add this in soon.  She is excited about coming to class to get help and time to herself. She will benefit from the program in many ways from exercise to support!!  Tammy Hatfield has been doing well in rehab.  She enjoyed her thanksgiving holiday .  She is improved to 2.2 METs on the NuStep.  We will continue to monitor her progress.   Reviewed home exercise with pt today.  Pt plans to use facility where she lives for exercise.  Reviewed THR, pulse, RPE, sign and symptoms, NTG use, and when to call 911 or MD.  Also  discussed weather considerations and indoor options.  Pt voiced understanding.  [name] saw her Dr last week after having s/s - will get stress test results today.  Advised her to follow Dr orders about exercise and follow up with staff Thursday.  Tammy Hatfield has been doing well in rehab.  She has been out with testing at the doctor's office but has been cleared to return.  We will get her back on her workloads and begin to start to push a little more.  We will continue to monitor her progress.   Expected Outcomes  Short: Use RPE daily to regulate intensity.  Long: Follow program prescription in THR.  Short: Review home exercise guidelines.  Long: Continue to attend classes regularly to work on strength and stamina.   Short: Review home exercise guidelines.  Long: Continue to attend classes regularly to work on strength and stamina.   Short - Tammy Hatfield will add exercise at home pending Dr  clearance after stress test.  Long - [name] will exercise independently.  Short:  Tammy Hatfield will add in exercsie home.  Long: Continue to increase strength and stamina.    [place]ondo Name 08/22/17 1007 09/02/17 1305           Exercise Goal Re-Evaluation   Exercise Goals Review  Increase Physical Activity;Increase Strength and Stamina  Increase Physical Activity;Increase Strength and Stamina;Understanding of Exercise Prescription      Comments  [name] hasn't started exercise at home yet due to snow and not being able to get to the exercise room.  Tammy Hatfield has been doing well in rehab.  She is coming consistently and working hard.  We need to encourage her to try to get in at least one extra day a week at home.  She was able to move up to level 4 on the XR.  We will continue to monitor her progression.       Expected Outcomes  Short - Tammy Hatfield will begin exercise when the snow is cleared.  Long - Tammy Hatfield will exercise on her own.  Short: Exercise at home routinely.  Long: Continue to increase activity levels.          Discharge Exercise Prescription (Final Exercise Prescription Changes): Exercise Prescription Changes - 09/02/17 1300      Response to Exercise   Blood Pressure (Admit)  134/76    Blood Pressure (Exercise)  146/64    Blood Pressure (Exit)  92/52    Heart Rate (Admit)  84 bpm    Heart Rate (Exercise)  112 bpm    Heart Rate (Exit)  81 bpm    Rating of Perceived Exertion (Exercise)  13    Symptoms  none    Duration  Continue with 45 min of aerobic exercise without signs/symptoms of physical distress.    Intensity  THRR unchanged      Progression   Progression  Continue to progress workloads to maintain intensity without signs/symptoms of physical distress.    Average METs  2.48      Resistance Training   Training Prescription  Yes    Weight  2 lbs    Reps  10-15      Interval Training   Interval Training  No      Treadmill   MPH  1.5    Grade  0    Minutes  15    METs  2.15      NuStep   Level   2    Minutes  15  METs  2.5      REL-XR   Level  4    Minutes  15    METs  2.8      Home Exercise Plan   Plans to continue exercise at  Home (comment) walking and gym in complex    Frequency  Add 2 additional days to program exercise sessions.    Initial Home Exercises Provided  08/13/17       Nutrition:  Target Goals: Understanding of nutrition guidelines, daily intake of sodium <1578m, cholesterol <2042m calories 30% from fat and 7% or less from saturated fats, daily to have 5 or more servings of fruits and vegetables.  Biometrics: Pre Biometrics - 07/18/17 1427      Pre Biometrics   Height  5' 4.5" (1.638 m)    Weight  218 lb (98.9 kg)    Waist Circumference  40 inches    Hip Circumference  49.75 inches    Waist to Hip Ratio  0.8 %    BMI (Calculated)  36.86    Single Leg Stand  5.31 seconds        Nutrition Therapy Plan and Nutrition Goals: Nutrition Therapy & Goals - 07/18/17 1332      Intervention Plan   Intervention  Prescribe, educate and counsel regarding individualized specific dietary modifications aiming towards targeted core components such as weight, hypertension, lipid management, diabetes, heart failure and other comorbidities.    Expected Outcomes  Short Term Goal: A plan has been developed with personal nutrition goals set during dietitian appointment.;Short Term Goal: Understand basic principles of dietary content, such as calories, fat, sodium, cholesterol and nutrients.;Long Term Goal: Adherence to prescribed nutrition plan.       Nutrition Discharge: Rate Your Plate Scores: Nutrition Assessments - 07/18/17 1157      MEDFICTS Scores   Pre Score  89       Nutrition Goals Re-Evaluation: Nutrition Goals Re-Evaluation    Row Name 07/30/17 1120 08/22/17 1008           Goals   Nutrition Goal  Make nutrition appointment after Holidays  -      Comment  [name] would like to lose weight, but she has a lot going on and needs some help in order to  focus on this.  [name] wants to schedule with RD but has missed phone calls.      Expected Outcome  Short: make appointment.  Long: Follow heart healthy diet  Short - [name] will meet with the RD ASAP.  Long - [name] will follow RD advice.         Nutrition Goals Discharge (Final Nutrition Goals Re-Evaluation): Nutrition Goals Re-Evaluation - 08/22/17 1008      Goals   Comment  [name] wants to schedule with RD but has missed phone calls.    Expected Outcome  Short - AnLelon Frohlichill meet with the RD ASAP.  Long - [name] will follow RD advice.       Psychosocial: Target Goals: Acknowledge presence or absence of significant depression and/or stress, maximize coping skills, provide positive support system. Participant is able to verbalize types and ability to use techniques and skills needed for reducing stress and depression.   Initial Review & Psychosocial Screening: Initial Psych Review & Screening - 07/18/17 1332      Initial Review   Current issues with  Current Depression;History of Depression;Current Psychotropic Meds;Current Sleep Concerns;Current Stress Concerns    Source of Stress Concerns  Financial;Family  Comments  Husband at [facility] for Dementia. Had to file for Medicaid-fianances hard.     Lives alone.  Has great support from children      [place]?  Yes 2 daughters and 1 son      Barriers   Psychosocial barriers to participate in program  There are no identifiable barriers or psychosocial needs.;The patient should benefit from training in stress management and relaxation.      Screening Interventions   Interventions  Encouraged to exercise;To provide support and resources with identified psychosocial needs;Provide feedback about the scores to participant;Program counselor consult;Yes    Expected Outcomes  Long Term Goal: Stressors or current issues are controlled or eliminated.;Short Term goal: Utilizing psychosocial counselor, staff and physician to assist with  identification of specific Stressors or current issues interfering with healing process. Setting desired goal for each stressor or current issue identified.;Short Term goal: Identification and review with participant of any Quality of Life or Depression concerns found by scoring the questionnaire.;Long Term goal: The participant improves quality of Life and PHQ9 Scores as seen by post scores and/or verbalization of changes Reviewed scores with Tammy Hatfield. Talked to her about how attending the program and taking steps to care for herself may help improve her scores.         Quality of Life Scores:  Quality of Life - 07/18/17 1337      Quality of Life Scores   Health/Function Pre  10.18 %    Socioeconomic Pre  16.25 %    Psych/Spiritual Pre  10.1 %    Family Pre  15 %    GLOBAL Pre  11.78 %       PHQ-9: Recent Review Flowsheet Data    Depression screen [facility] 2/9 07/18/2017   Decreased Interest 0   Down, Depressed, Hopeless 0   PHQ - 2 Score 0   Altered sleeping 1    Tired, decreased energy 3   Change in appetite 2   Feeling bad or failure about yourself  0   Trouble concentrating 0   Moving slowly or fidgety/restless 0   Suicidal thoughts 0   PHQ-9 Score 6   Difficult doing work/chores Not difficult at all     Interpretation of Total Score  Total Score Depression Severity:  1-4 = Minimal depression, 5-9 = Mild depression, 10-14 = Moderate depression, 15-19 = Moderately severe depression, 20-27 = Severe depression   Psychosocial Evaluation and Intervention: Psychosocial Evaluation - 07/31/17 1720      Psychosocial Evaluation & Interventions   Interventions  Stress management education;Therapist referral;Encouraged to exercise with the program and follow exercise prescription;Relaxation education    Comments  Counselor met with Ms. Raliegh Ip Tammy Hatfield) for initial psychosocial evaluation.  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  Tammy Hatfield has a strong support  system with her two daughters and a son who live locally.  She has 6 grandchildren and 6 Great grandchildren.  Tammy Hatfield also has a spouse who has had dementia since 2010 and is currently in a nursing home.  Tammy Hatfield has struggled with her own health issues in addition to her heart condition, she is a breast cancer survivor for 3 years now cancer free.  She is tired a lot and is on new medications that are "too expensive" as Tammy Hatfield lives on a fixed and limited income.  She has a history of depression and anxiety and is on  medications for this; but reports feeling "edgy often."  Tammy Hatfield states her mood is "stable" but she was very tearful today discussing her stressors - particularly financial.  She has goals to be healthier; improve her diet and lose some weight while in this program.  Counselor provided some stress management strategies for Tammy Hatfield today and the class practiced mindfulness and relaxation altogether.  This counselor recommended Tammy Hatfield to see a therapist and she is concerned about the cost of this.  Counselor will look into options and get those to [place] next week.  She agreed to give each of her adult children something they can do to help alleviate some stress tomorrow when she sees them for Thanksgiving.  Tammy Hatfield is hesitant to allow others to know her needs and tries to be strong.  But it is time to allow others to help. She agreed to do so.  Counselor will follow with Tammy Hatfield re: this and overall while she is in this program.      Expected Outcomes  Tammy Hatfield will benefit from consistent exercise to achieve her stated goals.  She will meet with the dietician to address her weight loss goal.  Tammy Hatfield agreed to see a therapist as long as there is no co-pay involved.  Counselor will research and provide info on this next week.  Tammy Hatfield will begin sharing her needs with others more openly to help her through this stressful time.      Continue Psychosocial Services   Follow up required by counselor       Psychosocial  Re-Evaluation: Psychosocial Re-Evaluation    [place] Name 07/30/17 [phone] 08/06/17 0953 08/22/17 0840         Psychosocial Re-Evaluation   Current issues with  Current Depression;Current Anxiety/Panic;Current Stress Concerns  Current Anxiety/Panic;History of Depression;Current Depression;Current Stress Concerns  -     Comments  Tammy Hatfield broke down today while talking about how she is doing.  Her husband is in [place] with dementia since 2010.  She only goes to see him once a week as it is too hard on her mentally to see him.  He has a minimal lucid moment and usally does not recognize her.  Their 37 year anniversary was on Sunday and he was not aware at all.  This really hit her hard.  She is also stuggling financially with caring for him, paying for care, and paying for her medications.  I gave her contact information for the medication management clinic to get help with her medications.  She also really needs to talk with Juliann Pulse to establish care with a psychologist to work on her anxiety and depression.  She has symptoms of both.  Her anxiety has now reached a point of causing chest pain any time she gets upset over her husband or finances.  She is also stressed by juggling all of her medical care and appointments.  And to add to it, she would like to remain strong for her family.  She has always been the strong matriarch of her family, she does not want to burden her kids but they want to know what is going on.  She got teary eyed while we discussing everything.  I encouraged her to get involved with her classmates and with heart sisters to establish a support system with similar circumstances.  She is excited about what we can do to help.  She would also like help to find a cheaper place to live to help with finances.  Counselor follow up today with  [name].  She reports having a great thanksgiving and was in a better mood today - more relaxed.  Counselor provided contact information for a therapist for Tammy Hatfield and she  appeared relieved to know there is someone to help her during this time.  She agreed to call today to set an appointment.  Counselor will continue to follow.    Counselor follow up with Tammy Hatfield reporting feeling better emotionally since began this class.  She is realizing the value of self care.  Tammy Hatfield has called the therapist this counselor suggested, but no response yet.  Counselor left a message for the therapist and suggested Tammy Hatfield try her again.  Counselor commended [name] for her positive self care and exercising consistently.  Counselor will continue to follow.       Expected Outcomes  Short: Meet with Juliann Pulse next week.  Long: Get help with anxiety and depression.   Tammy Hatfield will call the therapist to begin counseling services to help her through this stressful time in her life.    Tammy Hatfield will continue exercising for her health and mental health and practice positive self care.  She will call the therapist again to begin mental health support.     Interventions  -  Therapist referral;Stress management education  Relaxation education     Continue Psychosocial Services   Follow up required by counselor  Follow up required by counselor  Follow up required by counselor     Comments  Husband at [facility] for Dementia. Had to file for Medicaid-fianances hard.     Lives alone.  Has great support from children  -  -       Initial Review   Source of Stress Concerns  Chronic Illness;Unable to participate in former interests or hobbies;Family;Financial  -  -        Psychosocial Discharge (Final Psychosocial Re-Evaluation): Psychosocial Re-Evaluation - 08/22/17 0840      Psychosocial Re-Evaluation   Comments  Counselor follow up with Tammy Hatfield reporting feeling better emotionally since began this class.  She is realizing the value of self care.  Tammy Hatfield has called the therapist this counselor suggested, but no response yet.  Counselor left a message for the therapist and suggested Tammy Hatfield try her again.  Counselor commended [name] for her  positive self care and exercising consistently.  Counselor will continue to follow.      Expected Outcomes  Tammy Hatfield will continue exercising for her health and mental health and practice positive self care.  She will call the therapist again to begin mental health support.    Interventions  Relaxation education    Continue Psychosocial Services   Follow up required by counselor       Vocational Rehabilitation: Provide vocational rehab assistance to qualifying candidates.   Vocational Rehab Evaluation & Intervention: Vocational Rehab - 07/18/17 1340      Initial Vocational Rehab Evaluation & Intervention   Assessment shows need for Vocational Rehabilitation  No       Education: Education Goals: Education classes will be provided on a variety of topics geared toward better understanding of heart health and risk factor modification. Participant will state understanding/return demonstration of topics presented as noted by education test scores.  Learning Barriers/Preferences: Learning Barriers/Preferences - 07/18/17 1339      Learning Barriers/Preferences   Learning Barriers  None    Learning Preferences  Individual Instruction;Verbal Instruction       Education Topics: General Nutrition Guidelines/Fats and Fiber: -Group instruction provided by verbal, written material, models  and posters to present the general guidelines for heart healthy nutrition. Gives an explanation and review of dietary fats and fiber.   Cardiac Rehab from 09/05/2017 in [facility] Cardiac and Pulmonary Rehab  Date  08/27/17  Educator  CR  Instruction Review Code  1- Verbalizes Understanding      Controlling Sodium/Reading Food Labels: -Group verbal and written material supporting the discussion of sodium use in heart healthy nutrition. Review and explanation with models, verbal and written materials for utilization of the food label.   Cardiac Rehab from 09/05/2017 in [facility] Cardiac and Pulmonary Rehab  Date   08/27/17  Educator  CR  Instruction Review Code  1- Verbalizes Understanding      Exercise Physiology & Risk Factors: - Group verbal and written instruction with models to review the exercise physiology of the cardiovascular system and associated critical values. Details cardiovascular disease risk factors and the goals associated with each risk factor.   Cardiac Rehab from 09/05/2017 in [facility] Cardiac and Pulmonary Rehab  Date  09/05/17  Educator  [facility]  Instruction Review Code  1- Verbalizes Understanding      Aerobic Exercise & Resistance Training: - Gives group verbal and written discussion on the health impact of inactivity. On the components of aerobic and resistive training programs and the benefits of this training and how to safely progress through these programs.   Flexibility, Balance, General Exercise Guidelines: - Provides group verbal and written instruction on the benefits of flexibility and balance training programs. Provides general exercise guidelines with specific guidelines to those with heart or lung disease. Demonstration and skill practice provided.   Stress Management: - Provides group verbal and written instruction about the health risks of elevated stress, cause of high stress, and healthy ways to reduce stress.   Cardiac Rehab from 09/05/2017 in [facility] Cardiac and Pulmonary Rehab  Date  07/23/17  Educator  [facility]  Instruction Review Code  1- Verbalizes Understanding      Depression: - Provides group verbal and written instruction on the correlation between heart/lung disease and depressed mood, treatment options, and the stigmas associated with seeking treatment.   Anatomy & Physiology of the Heart: - Group verbal and written instruction and models provide basic cardiac anatomy and physiology, with the coronary electrical and arterial systems. Review of: AMI, Angina, Valve disease, Heart Failure, Cardiac Arrhythmia, Pacemakers, and the ICD.   Cardiac Rehab from  09/05/2017 in [facility] Cardiac and Pulmonary Rehab  Date  07/25/17  Educator  [place]  Instruction Review Code  1- Verbalizes Understanding      Cardiac Procedures: - Group verbal and written instruction to review commonly prescribed medications for heart disease. Reviews the medication, class of the drug, and side effects. Includes the steps to properly store meds and maintain the prescription regimen. (beta blockers and nitrates)   Cardiac Rehab from 09/05/2017 in [facility] Cardiac and Pulmonary Rehab  Date  07/30/17  Educator  SB  Instruction Review Code  1- Verbalizes Understanding      Cardiac Medications I: - Group verbal and written instruction to review commonly prescribed medications for heart disease. Reviews the medication, class of the drug, and side effects. Includes the steps to properly store meds and maintain the prescription regimen.   Cardiac Rehab from 09/05/2017 in [facility] Cardiac and Pulmonary Rehab  Date  08/06/17  Educator  [place]  Instruction Review Code  1- Verbalizes Understanding      Cardiac Medications II: -Group verbal and written instruction to review commonly prescribed  medications for heart disease. Reviews the medication, class of the drug, and side effects. (all other drug classes)    Go Sex-Intimacy & Heart Disease, Get SMART - Goal Setting: - Group verbal and written instruction through game format to discuss heart disease and the return to sexual intimacy. Provides group verbal and written material to discuss and apply goal setting through the application of the S.M.A.R.T. Method.   Cardiac Rehab from 09/05/2017 in [facility] Cardiac and Pulmonary Rehab  Date  07/30/17  Educator  SB  Instruction Review Code  1- Verbalizes Understanding      Other Matters of the Heart: - Provides group verbal, written materials and models to describe Heart Failure, Angina, Valve Disease, Peripheral Artery Disease, and Diabetes in the realm of heart disease. Includes description of  the disease process and treatment options available to the cardiac patient.   Cardiac Rehab from 09/05/2017 in [facility] Cardiac and Pulmonary Rehab  Date  07/25/17  Educator  [place]  Instruction Review Code  1- Verbalizes Understanding      Exercise & Equipment Safety: - Individual verbal instruction and demonstration of equipment use and safety with use of the equipment.   Cardiac Rehab from 09/05/2017 in [facility] Cardiac and Pulmonary Rehab  Date  07/18/17  Educator  [facility]  Instruction Review Code  1- Verbalizes Understanding      Infection Prevention: - Provides verbal and written material to individual with discussion of infection control including proper hand washing and proper equipment cleaning during exercise session.   Cardiac Rehab from 09/05/2017 in [facility] Cardiac and Pulmonary Rehab  Date  07/18/17  Educator  [facility]  Instruction Review Code  1- Verbalizes Understanding      Falls Prevention: - Provides verbal and written material to individual with discussion of falls prevention and safety.   Cardiac Rehab from 09/05/2017 in [facility] Cardiac and Pulmonary Rehab  Date  07/18/17  Educator  [facility]  Instruction Review Code  1- Verbalizes Understanding      Diabetes: - Individual verbal and written instruction to review signs/symptoms of diabetes, desired ranges of glucose level fasting, after meals and with exercise. Acknowledge that pre and post exercise glucose checks will be done for 3 sessions at entry of program.   Cardiac Rehab from 09/05/2017 in [facility] Cardiac and Pulmonary Rehab  Date  07/18/17  Educator  Sb  Instruction Review Code  1- Verbalizes Understanding      Other: -Provides group and verbal instruction on various topics (see comments)    Knowledge Questionnaire Score: Knowledge Questionnaire Score - 07/23/17 0855      Knowledge Questionnaire Score   Pre Score  24/28 reviewed with pt today       Core Components/Risk Factors/Patient Goals at Admission: Personal  Goals and Risk Factors at Admission - 07/18/17 1341      Core Components/Risk Factors/Patient Goals on Admission    Weight Management  Yes;Obesity;Weight Loss    Intervention  Weight Management: Develop a combined nutrition and exercise program designed to reach desired caloric intake, while maintaining appropriate intake of nutrient and fiber, sodium and fats, and appropriate energy expenditure required for the weight goal.;Weight Management/Obesity: Establish reasonable short term and long term weight goals.;Obesity: Provide education and appropriate resources to help participant work on and attain dietary goals.    Admit Weight  218 lb (98.9 kg)    Goal Weight: Short Term  215 lb (97.5 kg)    Goal Weight: Long Term  150 lb (68 kg)  Expected Outcomes  Short Term: Continue to assess and modify interventions until short term weight is achieved;Long Term: Adherence to nutrition and physical activity/exercise program aimed toward attainment of established weight goal;Weight Loss: Understanding of general recommendations for a balanced deficit meal plan, which promotes 1-2 lb weight loss per week and includes a negative energy balance of [phone] kcal/d    Diabetes  Yes    Intervention  Provide education about signs/symptoms and action to take for hypo/hyperglycemia.;Provide education about proper nutrition, including hydration, and aerobic/resistive exercise prescription along with prescribed medications to achieve blood glucose in normal ranges: Fasting glucose 65-99 mg/dL    Expected Outcomes  Short Term: Participant verbalizes understanding of the signs/symptoms and immediate care of hyper/hypoglycemia, proper foot care and importance of medication, aerobic/resistive exercise and nutrition plan for blood glucose control.;Long Term: Attainment of HbA1C < 7%.    Hypertension  Yes    Intervention  Provide education on lifestyle modifcations including regular physical activity/exercise, weight  management, moderate sodium restriction and increased consumption of fresh fruit, vegetables, and low fat dairy, alcohol moderation, and smoking cessation.;Monitor prescription use compliance.    Expected Outcomes  Short Term: Continued assessment and intervention until BP is < 140/42m HG in hypertensive participants. < 130/848mHG in hypertensive participants with diabetes, heart failure or chronic kidney disease.;Long Term: Maintenance of blood pressure at goal levels.    Lipids  Yes    Intervention  Provide education and support for participant on nutrition & aerobic/resistive exercise along with prescribed medications to achieve LDL <704mHDL >68m58m  Expected Outcomes  Short Term: Participant states understanding of desired cholesterol values and is compliant with medications prescribed. Participant is following exercise prescription and nutrition guidelines.;Long Term: Cholesterol controlled with medications as prescribed, with individualized exercise RX and with personalized nutrition plan. Value goals: LDL < 70mg32mL > 40 mg.    Stress  Yes    Intervention  Offer individual and/or small group education and counseling on adjustment to heart disease, stress management and health-related lifestyle change. Teach and support self-help strategies.;Refer participants experiencing significant psychosocial distress to appropriate mental health specialists for further evaluation and treatment. When possible, include family members and significant others in education/counseling sessions.    Expected Outcomes  Short Term: Participant demonstrates changes in health-related behavior, relaxation and other stress management skills, ability to obtain effective social support, and compliance with psychotropic medications if prescribed.;Long Term: Emotional wellbeing is indicated by absence of clinically significant psychosocial distress or social isolation.       Core Components/Risk Factors/Patient Goals  Review:  Goals and Risk Factor Review    Row Name 07/30/17 0905 07/30/17 1113 08/22/17 1010         Core Components/Risk Factors/Patient Goals Review   Personal Goals Review  -  Weight Management/Obesity;Hypertension;Lipids;Diabetes  Weight Management/Obesity;Hypertension     Review  -  [name] hLelon Frohlichbeen doing well in rehab.  Her weight was up today, but she has been stress eating.  Her blood pressures and blood sugars have been good.  She checks them at home routinely and tracks them for her doctor.  She feels her medications are working for her for the most part. She has a lot of stress and anxiety which tirggers chest pain.  She is going to talk with KathyJuliann Pulset this too.  [name] saw Dr Khan Humphrey Rollshad an angiogram which she stated showed her heart is ok.  The PA cleared her to return to exercise.  She is  still having some of the chest and neck burning so has met with Gertie Fey Doc.  She is having enzymes checked and is asking Dr for a referral to a vascular Dr.  She was advised to keep her exercise very lightly on her own.      Expected Outcomes  -  Short: Continue to work on weight loss and meet to talk about stress and aniexty.  Long: Continue to monitor and work on risk factor modifications.   Short - Tammy Hatfield will follow up with Dr recommendations.  Long - Anns Dr will help he resolve the issues so she can exercise without worry.          Core Components/Risk Factors/Patient Goals at Discharge (Final Review):  Goals and Risk Factor Review - 08/22/17 1010      Core Components/Risk Factors/Patient Goals Review   Personal Goals Review  Weight Management/Obesity;Hypertension    Review  Tammy Hatfield saw Dr Humphrey Rolls and had an angiogram which she stated showed her heart is ok.  The PA cleared her to return to exercise.  She is still having some of the chest and neck burning so has met with Gertie Fey Doc.  She is having enzymes checked and is asking Dr for a referral to a vascular Dr.  She was advised to keep her exercise very  lightly on her own.     Expected Outcomes  Short - Tammy Hatfield will follow up with Dr recommendations.  Long - Anns Dr will help he resolve the issues so she can exercise without worry.         ITP Comments: ITP Comments    Row Name 07/18/17 1325 08/08/17 0923 08/14/17 0606 09/11/17 0655     ITP Comments  medical review completed.  Initial ITP sent to Dr Elta Guadeloupe Sabra Heck for review, necessary changes and cosign.  Diagnosis documentation can be found in [facility]10/26/2018  Tammy Hatfield mentioned that she has had some "burning" in chest and neck similar to symptoms before her event.  She slowed down and the sensation went away.  She was advised to call her cardiologist today.  She saw him Monday but they did not discuss this symptom.  30 day review. Continue with ITP unless directed changes per Medical Director review.   30 day review. Continue with ITP unless directed changes per Medical Director review.        Comments:

## 2017-09-11 NOTE — Progress Notes (Signed)
Cardiac Individual Treatment Plan  Patient Details  Name: Tammy Hatfield MRN: [id number] Date of Birth: [birth year] Referring Provider:     Cardiac Rehab from 07/18/2017 in [facility] Cardiac and Pulmonary Rehab  Referring Provider  khan      Initial Encounter Date:    Cardiac Rehab from 07/18/2017 in [facility] Cardiac and Pulmonary Rehab  Date  07/18/17  Referring Provider  khan      Visit Diagnosis: Status post coronary artery stent placement  Patient's Home Medications on Admission:  Current Outpatient Medications:  .  albuterol (PROVENTIL HFA;VENTOLIN HFA) 108 (90 BASE) MCG/ACT inhaler, Inhale 2 puffs into the lungs every 4 (four) hours as needed for wheezing or shortness of breath., Disp: , Rfl:  .  ALPRAZolam (XANAX) 0.5 MG tablet, Take 0.5 mg by mouth 2 (two) times daily as needed for anxiety., Disp: , Rfl:  .  aspirin 81 MG chewable tablet, Chew 1 tablet (81 mg total) by mouth daily., Disp: , Rfl:  .  glipiZIDE (GLUCOTROL) 10 MG tablet, Take 10 mg by mouth daily before breakfast., Disp: , Rfl:  .  insulin NPH-regular Human (NOVOLIN 70/30) (70-30) 100 UNIT/ML injection, Inject 15-25 Units into the skin 2 (two) times daily with a meal. , Disp: , Rfl:  .  isosorbide mononitrate (IMDUR) 30 MG 24 hr tablet, Take 1 tablet (30 mg total) by mouth daily., Disp: 30 tablet, Rfl: 0 .  loratadine (CLARITIN) 10 MG tablet, Take 10 mg by mouth daily as needed. , Disp: , Rfl:  .  losartan (COZAAR) 50 MG tablet, Take 50 mg by mouth every morning. , Disp: , Rfl:  .  metoprolol succinate (TOPROL-XL) 25 MG 24 hr tablet, , Disp: , Rfl:  .  metoprolol tartrate (LOPRESSOR) 25 MG tablet, Take 1 tablet (25 mg total) by mouth 2 (two) times daily., Disp: 60 tablet, Rfl: 0 .  nitroGLYCERIN (NITROSTAT) 0.4 MG SL tablet, Place 1 tablet (0.4 mg total) under the tongue every 5 (five) minutes as needed for chest pain., Disp: 30 tablet, Rfl: 0 .  omeprazole (PRILOSEC) 20 MG capsule, Take 20 mg by mouth 2 (two) times  daily. , Disp: , Rfl:  .  PARoxetine (PAXIL) 40 MG tablet, Take 20 mg by mouth 2 (two) times daily. , Disp: , Rfl:  .  ticagrelor (BRILINTA) 90 MG TABS tablet, Take 1 tablet (90 mg total) by mouth 2 (two) times daily., Disp: 60 tablet, Rfl: 0 .  traMADol (ULTRAM) 50 MG tablet, Take 50 mg by mouth every 6 (six) hours as needed for moderate pain. , Disp: , Rfl:  .  valACYclovir (VALTREX) 500 MG tablet, TAKE ONE CAPLET BY MOUTH TWICE DAILY FOR 3 DAYS AS NEEDED FOR  FLARE, Disp: , Rfl:   Past Medical History: Past Medical History:  Diagnosis Date  . Arthritis    knees  . Barrett's esophagus   . Breast cancer ([place]) 2014   RT LUMPECTOMY  . Cancer [facility]) 2014   right breast - s\p Radiation and lumpectomy and LN dissection  . Chronic sinusitis   . COPD (chronic obstructive pulmonary disease) ([place]) 2012  . Degenerative lumbar disc   . Diabetes mellitus without complication ([place])   . Dysphagia   . Esophageal reflux   . History of Helicobacter pylori infection   . Hypercholesteremia   . Hyperlipidemia   . Hypertension   . Personal history of radiation therapy   . Radiation 2014   RT BREAST CA  . Ulcer, stomach peptic  history of    Tobacco Use: Social History   Tobacco Use  Smoking Status Former Smoker  . Packs/day: 2.00  . Years: 30.00  . Pack years: 60.00  . Last attempt to quit: 02/01/2000  . Years since quitting: 17.6  Smokeless Tobacco Never Used  Tobacco Comment   07/18/2017 former smoker quit 1999    Labs: Recent Review Flowsheet Data    Labs for ITP Cardiac and Pulmonary Rehab Latest Ref Rng & Units 02/11/2015 07/05/2017   Cholestrol 0 - 200 mg/dL - 255(H)   LDLCALC 0 - 99 mg/dL - 147(H)   HDL >40 mg/dL - 62   Trlycerides <150 mg/dL - 231(H)   Hemoglobin A1c 4.0 - 6.0 % 7.4(H) -       Exercise Target Goals: Date: 07/18/17  Exercise Program Goal: Individual exercise prescription set with THRR, safety & activity barriers. Participant demonstrates ability to  understand and report RPE using BORG scale, to self-measure pulse accurately, and to acknowledge the importance of the exercise prescription.  Exercise Prescription Goal: Starting with aerobic activity 30 plus minutes a day, 3 days per week for initial exercise prescription. Provide home exercise prescription and guidelines that participant acknowledges understanding prior to discharge.  Activity Barriers & Risk Stratification: Activity Barriers & Cardiac Risk Stratification - 07/18/17 1331      Activity Barriers & Cardiac Risk Stratification   Activity Barriers  Arthritis;Back Problems;Chest Pain/Angina;History of Falls;Joint Problems BOth knees "bone on bone"  needs surgery. Disc disease, bulging disc, arthritis all over.  Angina with exertion, History of falls because of "knees going out"    Cardiac Risk Stratification  High       6 Minute Walk: 6 Minute Walk    Row Name 07/18/17 1429         6 Minute Walk   Distance  912 feet     Walk Time  6 minutes     # of Rest Breaks  0     MPH  1.72     METS  1.8     RPE  14     Perceived Dyspnea   3     VO2 Peak  6.3     Symptoms  No     Resting HR  68 bpm     Resting BP  118/62     Resting Oxygen Saturation   95 %     Exercise Oxygen Saturation  during 6 min walk  95 %     Max Ex. HR  100 bpm     Max Ex. BP  156/66     2 Minute Post BP  130/64        Oxygen Initial Assessment:   Oxygen Re-Evaluation:   Oxygen Discharge (Final Oxygen Re-Evaluation):   Initial Exercise Prescription: Initial Exercise Prescription - 07/18/17 1400      Date of Initial Exercise RX and Referring Provider   Date  07/18/17    Referring Provider  khan      Treadmill   MPH  1.5    Grade  0    Minutes  15    METs  1.8      Recumbant Bike   Level  1    RPM  60    Watts  5    Minutes  15    METs  1.8      NuStep   Level  2    SPM  80    Minutes  15  METs  1.8      REL-XR   Level  1    Speed  50    Minutes  15    METs  1.8       Prescription Details   Frequency (times per week)  3    Duration  Progress to 45 minutes of aerobic exercise without signs/symptoms of physical distress      Intensity   THRR 40-80% of Max Heartrate  100-132    Ratings of Perceived Exertion  11-13    Perceived Dyspnea  0-4      Resistance Training   Training Prescription  Yes    Weight  2 lbs    Reps  10-15       Perform Capillary Blood Glucose checks as needed.  Exercise Prescription Changes: Exercise Prescription Changes    Row Name 07/18/17 1300 07/23/17 1500 08/06/17 1400 08/21/17 1400 09/02/17 1300     Response to Exercise   Blood Pressure (Admit)  118/62  110/62  126/74  120/62  134/76   Blood Pressure (Exercise)  156/66  146/72  130/54  124/72  146/64   Blood Pressure (Exit)  130/64  136/76  122/64  134/70  92/52   Heart Rate (Admit)  72 bpm  87 bpm  84 bpm  63 bpm  84 bpm   Heart Rate (Exercise)  100 bpm  105 bpm  99 bpm  97 bpm  112 bpm   Heart Rate (Exit)  71 bpm  79 bpm  82 bpm  74 bpm  81 bpm   Rating of Perceived Exertion (Exercise)  _0 Symptoms  -  none  none  none  none   Comments  -  first full day of exercise  -  -  -   Duration  -  Progress to 45 minutes of aerobic exercise without signs/symptoms of physical distress  Progress to 45 minutes of aerobic exercise without signs/symptoms of physical distress  Continue with 45 min of aerobic exercise without signs/symptoms of physical distress.  Continue with 45 min of aerobic exercise without signs/symptoms of physical distress.   Intensity  -  THRR unchanged  THRR unchanged  THRR unchanged  THRR unchanged     Progression   Progression  -  Continue to progress workloads to maintain intensity without signs/symptoms of physical distress.  Continue to progress workloads to maintain intensity without signs/symptoms of physical distress.  Continue to progress workloads to maintain intensity without signs/symptoms of physical distress.  Continue to  progress workloads to maintain intensity without signs/symptoms of physical distress.   Average METs  -  1.98  2.05  2.05  2.48     Resistance Training   Training Prescription  -  Yes  Yes  Yes  Yes   Weight  -  2 lbs  2 lbs  2 lbs  2 lbs   Reps  -  10-15  10-15  10-15  10-15     Interval Training   Interval Training  -  No  No  No  No     Treadmill   MPH  -  1.5  1.5  1.5  1.5   Grade  -  0  0  0  0   Minutes  -  _1 METs  -  1.8  2.15  2.15  2.15     NuStep  Level  -  -  _0 Minutes  -  -  _1 METs  -  -  1.8  1.8  2.5     REL-XR   Level  -  _2 Minutes  -  _3 METs  -  1.8  2.2  2.2  2.8     Home Exercise Plan   Plans to continue exercise at  -  -  -  Home (comment) walking and gym in complex  Home (comment) walking and gym in complex   Frequency  -  -  -  Add 2 additional days to program exercise sessions.  Add 2 additional days to program exercise sessions.   Initial Home Exercises Provided  -  -  -  08/13/17  08/13/17      Exercise Comments: Exercise Comments    Row Name 07/23/17 0858 08/08/17 0921 08/13/17 1104       Exercise Comments  First full day of exercise!  Patient was oriented to gym and equipment including functions, settings, policies, and procedures.  Patient's individual exercise prescription and treatment plan were reviewed.  All starting workloads were established based on the results of the 6 minute walk test done at initial orientation visit.  The plan for exercise progression was also introduced and progression will be customized based on patient's performance and goals.  [name] mentioned that she has had some "burning" in chest and neck similar to symptoms before her event.  She slowed down and the sensation went away.  She was advised to call her cardiologist today.  She saw him Monday but they did not discuss this symptom.   Reviewed home exercise with pt today.  Pt plans to use facility where she lives  for exercise.  Reviewed THR, pulse, RPE, sign and symptoms, NTG use, and when to call 911 or MD.  Also discussed weather considerations and indoor options.  Pt voiced understanding.  [name] saw her Dr last week after having s/s - will get stress test results today.  Advised her to follow Dr orders about exercise and follow up with staff Thursday.        Exercise Goals and Review: Exercise Goals    Row Name 07/18/17 1427             Exercise Goals   Intervention  Provide advice, education, support and counseling about physical activity/exercise needs.;Develop an individualized exercise prescription for aerobic and resistive training based on initial evaluation findings, risk stratification, comorbidities and participant's personal goals.       Expected Outcomes  Achievement of increased cardiorespiratory fitness and enhanced flexibility, muscular endurance and strength shown through measurements of functional capacity and personal statement of participant.       Increase Strength and Stamina  Yes       Intervention  Provide advice, education, support and counseling about physical activity/exercise needs.;Develop an individualized exercise prescription for aerobic and resistive training based on initial evaluation findings, risk stratification, comorbidities and participant's personal goals.       Expected Outcomes  Achievement of increased cardiorespiratory fitness and enhanced flexibility, muscular endurance and strength shown through measurements of functional capacity and personal statement of participant.       Able to understand and use rate of perceived exertion (RPE) scale  Yes       Intervention  Provide education and explanation on how to use RPE scale       Expected Outcomes  Short Term: Able to use RPE daily in rehab to express subjective intensity level;Long Term:  Able to use RPE to guide intensity level when exercising independently       Able to understand and use Dyspnea scale  Yes        Intervention  Provide education and explanation on how to use Dyspnea scale       Expected Outcomes  Short Term: Able to use Dyspnea scale daily in rehab to express subjective sense of shortness of breath during exertion;Long Term: Able to use Dyspnea scale to guide intensity level when exercising independently       Knowledge and understanding of Target Heart Rate Range (THRR)  Yes       Intervention  Provide education and explanation of THRR including how the numbers were predicted and where they are located for reference       Expected Outcomes  Short Term: Able to state/look up THRR;Long Term: Able to use THRR to govern intensity when exercising independently;Short Term: Able to use daily as guideline for intensity in rehab       Able to check pulse independently  Yes       Intervention  Provide education and demonstration on how to check pulse in carotid and radial arteries.;Review the importance of being able to check your own pulse for safety during independent exercise       Expected Outcomes  Short Term: Able to explain why pulse checking is important during independent exercise;Long Term: Able to check pulse independently and accurately       Understanding of Exercise Prescription  Yes       Intervention  Provide education, explanation, and written materials on patient's individual exercise prescription       Expected Outcomes  Short Term: Able to explain program exercise prescription;Long Term: Able to explain home exercise prescription to exercise independently          Exercise Goals Re-Evaluation : Exercise Goals Re-Evaluation    Row Name 07/23/17 0857 07/30/17 1111 08/06/17 1438 08/13/17 1104 08/21/17 1446     Exercise Goal Re-Evaluation   Exercise Goals Review  Knowledge and understanding of Target Heart Rate Range (THRR);Able to understand and use rate of perceived exertion (RPE) scale;Understanding of Exercise Prescription  Increase Physical Activity;Understanding of  Exercise Prescription;Increase Strength and Stamina  Increase Physical Activity;Understanding of Exercise Prescription;Increase Strength and Stamina  Increase Physical Activity;Increase Strength and Stamina  Increase Physical Activity;Increase Strength and Stamina;Understanding of Exercise Prescription   Comments  Reviewed RPE scale, THR and program prescription with pt today.  Pt voiced understanding and was given a copy of goals to take home.  [name] is off to a good start in rehab.  She is not currently doing any exercise at home, but we will add this in soon.  She is excited about coming to class to get help and time to herself. She will benefit from the program in many ways from exercise to support!!  [name] has been doing well in rehab.  She enjoyed her thanksgiving holiday .  She is improved to 2.2 METs on the NuStep.  We will continue to monitor her progress.   Reviewed home exercise with pt today.  Pt plans to use facility where she lives for exercise.  Reviewed THR, pulse, RPE, sign and symptoms, NTG use, and when to call 911 or MD.  Also  discussed weather considerations and indoor options.  Pt voiced understanding.  [name] saw her Dr last week after having s/s - will get stress test results today.  Advised her to follow Dr orders about exercise and follow up with staff Thursday.  [name] has been doing well in rehab.  She has been out with testing at the doctor's office but has been cleared to return.  We will get her back on her workloads and begin to start to push a little more.  We will continue to monitor her progress.   Expected Outcomes  Short: Use RPE daily to regulate intensity.  Long: Follow program prescription in THR.  Short: Review home exercise guidelines.  Long: Continue to attend classes regularly to work on strength and stamina.   Short: Review home exercise guidelines.  Long: Continue to attend classes regularly to work on strength and stamina.   Short - [name] will add exercise at home pending Dr  clearance after stress test.  Long - [name] will exercise independently.  Short:  [name] will add in exercsie home.  Long: Continue to increase strength and stamina.    Rondo Name 08/22/17 1007 09/02/17 1305           Exercise Goal Re-Evaluation   Exercise Goals Review  Increase Physical Activity;Increase Strength and Stamina  Increase Physical Activity;Increase Strength and Stamina;Understanding of Exercise Prescription      Comments  [name] hasn't started exercise at home yet due to snow and not being able to get to the exercise room.  [name] has been doing well in rehab.  She is coming consistently and working hard.  We need to encourage her to try to get in at least one extra day a week at home.  She was able to move up to level 4 on the XR.  We will continue to monitor her progression.       Expected Outcomes  Short - [name] will begin exercise when the snow is cleared.  Long - [name] will exercise on her own.  Short: Exercise at home routinely.  Long: Continue to increase activity levels.          Discharge Exercise Prescription (Final Exercise Prescription Changes): Exercise Prescription Changes - 09/02/17 1300      Response to Exercise   Blood Pressure (Admit)  134/76    Blood Pressure (Exercise)  146/64    Blood Pressure (Exit)  92/52    Heart Rate (Admit)  84 bpm    Heart Rate (Exercise)  112 bpm    Heart Rate (Exit)  81 bpm    Rating of Perceived Exertion (Exercise)  13    Symptoms  none    Duration  Continue with 45 min of aerobic exercise without signs/symptoms of physical distress.    Intensity  THRR unchanged      Progression   Progression  Continue to progress workloads to maintain intensity without signs/symptoms of physical distress.    Average METs  2.48      Resistance Training   Training Prescription  Yes    Weight  2 lbs    Reps  10-15      Interval Training   Interval Training  No      Treadmill   MPH  1.5    Grade  0    Minutes  15    METs  2.15      NuStep   Level   2    Minutes  15  METs  2.5      REL-XR   Level  4    Minutes  15    METs  2.8      Home Exercise Plan   Plans to continue exercise at  Home (comment) walking and gym in complex    Frequency  Add 2 additional days to program exercise sessions.    Initial Home Exercises Provided  08/13/17       Nutrition:  Target Goals: Understanding of nutrition guidelines, daily intake of sodium <1578m, cholesterol <2042m calories 30% from fat and 7% or less from saturated fats, daily to have 5 or more servings of fruits and vegetables.  Biometrics: Pre Biometrics - 07/18/17 1427      Pre Biometrics   Height  5' 4.5" (1.638 m)    Weight  218 lb (98.9 kg)    Waist Circumference  40 inches    Hip Circumference  49.75 inches    Waist to Hip Ratio  0.8 %    BMI (Calculated)  36.86    Single Leg Stand  5.31 seconds        Nutrition Therapy Plan and Nutrition Goals: Nutrition Therapy & Goals - 07/18/17 1332      Intervention Plan   Intervention  Prescribe, educate and counsel regarding individualized specific dietary modifications aiming towards targeted core components such as weight, hypertension, lipid management, diabetes, heart failure and other comorbidities.    Expected Outcomes  Short Term Goal: A plan has been developed with personal nutrition goals set during dietitian appointment.;Short Term Goal: Understand basic principles of dietary content, such as calories, fat, sodium, cholesterol and nutrients.;Long Term Goal: Adherence to prescribed nutrition plan.       Nutrition Discharge: Rate Your Plate Scores: Nutrition Assessments - 07/18/17 1157      MEDFICTS Scores   Pre Score  89       Nutrition Goals Re-Evaluation: Nutrition Goals Re-Evaluation    Row Name 07/30/17 1120 08/22/17 1008           Goals   Nutrition Goal  Make nutrition appointment after Holidays  -      Comment  [name] would like to lose weight, but she has a lot going on and needs some help in order to  focus on this.  [name] wants to schedule with RD but has missed phone calls.      Expected Outcome  Short: make appointment.  Long: Follow heart healthy diet  Short - [name] will meet with the RD ASAP.  Long - [name] will follow RD advice.         Nutrition Goals Discharge (Final Nutrition Goals Re-Evaluation): Nutrition Goals Re-Evaluation - 08/22/17 1008      Goals   Comment  [name] wants to schedule with RD but has missed phone calls.    Expected Outcome  Short - AnLelon Frohlichill meet with the RD ASAP.  Long - [name] will follow RD advice.       Psychosocial: Target Goals: Acknowledge presence or absence of significant depression and/or stress, maximize coping skills, provide positive support system. Participant is able to verbalize types and ability to use techniques and skills needed for reducing stress and depression.   Initial Review & Psychosocial Screening: Initial Psych Review & Screening - 07/18/17 1332      Initial Review   Current issues with  Current Depression;History of Depression;Current Psychotropic Meds;Current Sleep Concerns;Current Stress Concerns    Source of Stress Concerns  Financial;Family  Comments  Husband at [facility] for Dementia. Had to file for Medicaid-fianances hard.     Lives alone.  Has great support from children      [place]?  Yes 2 daughters and 1 son      Barriers   Psychosocial barriers to participate in program  There are no identifiable barriers or psychosocial needs.;The patient should benefit from training in stress management and relaxation.      Screening Interventions   Interventions  Encouraged to exercise;To provide support and resources with identified psychosocial needs;Provide feedback about the scores to participant;Program counselor consult;Yes    Expected Outcomes  Long Term Goal: Stressors or current issues are controlled or eliminated.;Short Term goal: Utilizing psychosocial counselor, staff and physician to assist with  identification of specific Stressors or current issues interfering with healing process. Setting desired goal for each stressor or current issue identified.;Short Term goal: Identification and review with participant of any Quality of Life or Depression concerns found by scoring the questionnaire.;Long Term goal: The participant improves quality of Life and PHQ9 Scores as seen by post scores and/or verbalization of changes Reviewed scores with [name]. Talked to her about how attending the program and taking steps to care for herself may help improve her scores.         Quality of Life Scores:  Quality of Life - 07/18/17 1337      Quality of Life Scores   Health/Function Pre  10.18 %    Socioeconomic Pre  16.25 %    Psych/Spiritual Pre  10.1 %    Family Pre  15 %    GLOBAL Pre  11.78 %       PHQ-9: Recent Review Flowsheet Data    Depression screen [facility] 2/9 07/18/2017   Decreased Interest 0   Down, Depressed, Hopeless 0   PHQ - 2 Score 0   Altered sleeping 1    Tired, decreased energy 3   Change in appetite 2   Feeling bad or failure about yourself  0   Trouble concentrating 0   Moving slowly or fidgety/restless 0   Suicidal thoughts 0   PHQ-9 Score 6   Difficult doing work/chores Not difficult at all     Interpretation of Total Score  Total Score Depression Severity:  1-4 = Minimal depression, 5-9 = Mild depression, 10-14 = Moderate depression, 15-19 = Moderately severe depression, 20-27 = Severe depression   Psychosocial Evaluation and Intervention: Psychosocial Evaluation - 07/31/17 1720      Psychosocial Evaluation & Interventions   Interventions  Stress management education;Therapist referral;Encouraged to exercise with the program and follow exercise prescription;Relaxation education    Comments  Counselor met with Ms. Raliegh Ip [name]) for initial psychosocial evaluation.  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  [name] has a strong support  system with her two daughters and a son who live locally.  She has 6 grandchildren and 6 Great grandchildren.  [name] also has a spouse who has had dementia since 2010 and is currently in a nursing home.  [name] has struggled with her own health issues in addition to her heart condition, she is a breast cancer survivor for 3 years now cancer free.  She is tired a lot and is on new medications that are "too expensive" as [name] lives on a fixed and limited income.  She has a history of depression and anxiety and is on  medications for this; but reports feeling "edgy often."  [name] states her mood is "stable" but she was very tearful today discussing her stressors - particularly financial.  She has goals to be healthier; improve her diet and lose some weight while in this program.  Counselor provided some stress management strategies for [name] today and the class practiced mindfulness and relaxation altogether.  This counselor recommended [name] to see a therapist and she is concerned about the cost of this.  Counselor will look into options and get those to [place] next week.  She agreed to give each of her adult children something they can do to help alleviate some stress tomorrow when she sees them for Thanksgiving.  [name] is hesitant to allow others to know her needs and tries to be strong.  But it is time to allow others to help. She agreed to do so.  Counselor will follow with [name] re: this and overall while she is in this program.      Expected Outcomes  [name] will benefit from consistent exercise to achieve her stated goals.  She will meet with the dietician to address her weight loss goal.  [name] agreed to see a therapist as long as there is no co-pay involved.  Counselor will research and provide info on this next week.  [name] will begin sharing her needs with others more openly to help her through this stressful time.      Continue Psychosocial Services   Follow up required by counselor       Psychosocial  Re-Evaluation: Psychosocial Re-Evaluation    [place] Name 07/30/17 [phone] 08/06/17 0953 08/22/17 0840         Psychosocial Re-Evaluation   Current issues with  Current Depression;Current Anxiety/Panic;Current Stress Concerns  Current Anxiety/Panic;History of Depression;Current Depression;Current Stress Concerns  -     Comments  [name] broke down today while talking about how she is doing.  Her husband is in [place] with dementia since 2010.  She only goes to see him once a week as it is too hard on her mentally to see him.  He has a minimal lucid moment and usally does not recognize her.  Their 37 year anniversary was on Sunday and he was not aware at all.  This really hit her hard.  She is also stuggling financially with caring for him, paying for care, and paying for her medications.  I gave her contact information for the medication management clinic to get help with her medications.  She also really needs to talk with Juliann Pulse to establish care with a psychologist to work on her anxiety and depression.  She has symptoms of both.  Her anxiety has now reached a point of causing chest pain any time she gets upset over her husband or finances.  She is also stressed by juggling all of her medical care and appointments.  And to add to it, she would like to remain strong for her family.  She has always been the strong matriarch of her family, she does not want to burden her kids but they want to know what is going on.  She got teary eyed while we discussing everything.  I encouraged her to get involved with her classmates and with heart sisters to establish a support system with similar circumstances.  She is excited about what we can do to help.  She would also like help to find a cheaper place to live to help with finances.  Counselor follow up today with  [name].  She reports having a great thanksgiving and was in a better mood today - more relaxed.  Counselor provided contact information for a therapist for [name] and she  appeared relieved to know there is someone to help her during this time.  She agreed to call today to set an appointment.  Counselor will continue to follow.    Counselor follow up with [name] reporting feeling better emotionally since began this class.  She is realizing the value of self care.  [name] has called the therapist this counselor suggested, but no response yet.  Counselor left a message for the therapist and suggested [name] try her again.  Counselor commended [name] for her positive self care and exercising consistently.  Counselor will continue to follow.       Expected Outcomes  Short: Meet with Juliann Pulse next week.  Long: Get help with anxiety and depression.   [name] will call the therapist to begin counseling services to help her through this stressful time in her life.    [name] will continue exercising for her health and mental health and practice positive self care.  She will call the therapist again to begin mental health support.     Interventions  -  Therapist referral;Stress management education  Relaxation education     Continue Psychosocial Services   Follow up required by counselor  Follow up required by counselor  Follow up required by counselor     Comments  Husband at [facility] for Dementia. Had to file for Medicaid-fianances hard.     Lives alone.  Has great support from children  -  -       Initial Review   Source of Stress Concerns  Chronic Illness;Unable to participate in former interests or hobbies;Family;Financial  -  -        Psychosocial Discharge (Final Psychosocial Re-Evaluation): Psychosocial Re-Evaluation - 08/22/17 0840      Psychosocial Re-Evaluation   Comments  Counselor follow up with [name] reporting feeling better emotionally since began this class.  She is realizing the value of self care.  [name] has called the therapist this counselor suggested, but no response yet.  Counselor left a message for the therapist and suggested [name] try her again.  Counselor commended [name] for her  positive self care and exercising consistently.  Counselor will continue to follow.      Expected Outcomes  [name] will continue exercising for her health and mental health and practice positive self care.  She will call the therapist again to begin mental health support.    Interventions  Relaxation education    Continue Psychosocial Services   Follow up required by counselor       Vocational Rehabilitation: Provide vocational rehab assistance to qualifying candidates.   Vocational Rehab Evaluation & Intervention: Vocational Rehab - 07/18/17 1340      Initial Vocational Rehab Evaluation & Intervention   Assessment shows need for Vocational Rehabilitation  No       Education: Education Goals: Education classes will be provided on a variety of topics geared toward better understanding of heart health and risk factor modification. Participant will state understanding/return demonstration of topics presented as noted by education test scores.  Learning Barriers/Preferences: Learning Barriers/Preferences - 07/18/17 1339      Learning Barriers/Preferences   Learning Barriers  None    Learning Preferences  Individual Instruction;Verbal Instruction       Education Topics: General Nutrition Guidelines/Fats and Fiber: -Group instruction provided by verbal, written material, models  and posters to present the general guidelines for heart healthy nutrition. Gives an explanation and review of dietary fats and fiber.   Controlling Sodium/Reading Food Labels: -Group verbal and written material supporting the discussion of sodium use in heart healthy nutrition. Review and explanation with models, verbal and written materials for utilization of the food label.   Exercise Physiology & Risk Factors: - Group verbal and written instruction with models to review the exercise physiology of the cardiovascular system and associated critical values. Details cardiovascular disease risk factors and the  goals associated with each risk factor.   Aerobic Exercise & Resistance Training: - Gives group verbal and written discussion on the health impact of inactivity. On the components of aerobic and resistive training programs and the benefits of this training and how to safely progress through these programs.   Flexibility, Balance, General Exercise Guidelines: - Provides group verbal and written instruction on the benefits of flexibility and balance training programs. Provides general exercise guidelines with specific guidelines to those with heart or lung disease. Demonstration and skill practice provided.   Stress Management: - Provides group verbal and written instruction about the health risks of elevated stress, cause of high stress, and healthy ways to reduce stress.   Depression: - Provides group verbal and written instruction on the correlation between heart/lung disease and depressed mood, treatment options, and the stigmas associated with seeking treatment.   Anatomy & Physiology of the Heart: - Group verbal and written instruction and models provide basic cardiac anatomy and physiology, with the coronary electrical and arterial systems. Review of: AMI, Angina, Valve disease, Heart Failure, Cardiac Arrhythmia, Pacemakers, and the ICD.   Cardiac Procedures: - Group verbal and written instruction to review commonly prescribed medications for heart disease. Reviews the medication, class of the drug, and side effects. Includes the steps to properly store meds and maintain the prescription regimen. (beta blockers and nitrates)   Cardiac Medications I: - Group verbal and written instruction to review commonly prescribed medications for heart disease. Reviews the medication, class of the drug, and side effects. Includes the steps to properly store meds and maintain the prescription regimen.   Cardiac Medications II: -Group verbal and written instruction to review commonly prescribed  medications for heart disease. Reviews the medication, class of the drug, and side effects. (all other drug classes)    Go Sex-Intimacy & Heart Disease, Get SMART - Goal Setting: - Group verbal and written instruction through game format to discuss heart disease and the return to sexual intimacy. Provides group verbal and written material to discuss and apply goal setting through the application of the S.M.A.R.T. Method.   Other Matters of the Heart: - Provides group verbal, written materials and models to describe Heart Failure, Angina, Valve Disease, Peripheral Artery Disease, and Diabetes in the realm of heart disease. Includes description of the disease process and treatment options available to the cardiac patient.   Exercise & Equipment Safety: - Individual verbal instruction and demonstration of equipment use and safety with use of the equipment.   Cardiac Rehab from 07/18/2017 in [facility] Cardiac and Pulmonary Rehab  Date  07/18/17  Educator  [facility]  Instruction Review Code  1- Verbalizes Understanding      Infection Prevention: - Provides verbal and written material to individual with discussion of infection control including proper hand washing and proper equipment cleaning during exercise session.   Cardiac Rehab from 07/18/2017 in [facility] Cardiac and Pulmonary Rehab  Date  07/18/17  Educator  [facility]  Instruction Review Code  1- Verbalizes Understanding      Falls Prevention: - Provides verbal and written material to individual with discussion of falls prevention and safety.   Cardiac Rehab from 07/18/2017 in [facility] Cardiac and Pulmonary Rehab  Date  07/18/17  Educator  [facility]  Instruction Review Code  1- Verbalizes Understanding      Diabetes: - Individual verbal and written instruction to review signs/symptoms of diabetes, desired ranges of glucose level fasting, after meals and with exercise. Acknowledge that pre and post exercise glucose checks will be done for 3 sessions at entry of  program.   Cardiac Rehab from 07/18/2017 in [facility] Cardiac and Pulmonary Rehab  Date  07/18/17  Educator  [facility]  Instruction Review Code  1- Verbalizes Understanding      Other: -Provides group and verbal instruction on various topics (see comments)    Knowledge Questionnaire Score: Knowledge Questionnaire Score - 07/23/17 0855      Knowledge Questionnaire Score   Pre Score  24/28 reviewed with pt today       Core Components/Risk Factors/Patient Goals at Admission: Personal Goals and Risk Factors at Admission - 07/18/17 1341      Core Components/Risk Factors/Patient Goals on Admission    Weight Management  Yes;Obesity;Weight Loss    Intervention  Weight Management: Develop a combined nutrition and exercise program designed to reach desired caloric intake, while maintaining appropriate intake of nutrient and fiber, sodium and fats, and appropriate energy expenditure required for the weight goal.;Weight Management/Obesity: Establish reasonable short term and long term weight goals.;Obesity: Provide education and appropriate resources to help participant work on and attain dietary goals.    Admit Weight  218 lb (98.9 kg)    Goal Weight: Short Term  215 lb (97.5 kg)    Goal Weight: Long Term  150 lb (68 kg)    Expected Outcomes  Short Term: Continue to assess and modify interventions until short term weight is achieved;Long Term: Adherence to nutrition and physical activity/exercise program aimed toward attainment of established weight goal;Weight Loss: Understanding of general recommendations for a balanced deficit meal plan, which promotes 1-2 lb weight loss per week and includes a negative energy balance of [phone] kcal/d    Diabetes  Yes    Intervention  Provide education about signs/symptoms and action to take for hypo/hyperglycemia.;Provide education about proper nutrition, including hydration, and aerobic/resistive exercise prescription along with prescribed medications to achieve blood  glucose in normal ranges: Fasting glucose 65-99 mg/dL    Expected Outcomes  Short Term: Participant verbalizes understanding of the signs/symptoms and immediate care of hyper/hypoglycemia, proper foot care and importance of medication, aerobic/resistive exercise and nutrition plan for blood glucose control.;Long Term: Attainment of HbA1C < 7%.    Hypertension  Yes    Intervention  Provide education on lifestyle modifcations including regular physical activity/exercise, weight management, moderate sodium restriction and increased consumption of fresh fruit, vegetables, and low fat dairy, alcohol moderation, and smoking cessation.;Monitor prescription use compliance.    Expected Outcomes  Short Term: Continued assessment and intervention until BP is < 140/57m HG in hypertensive participants. < 130/816mHG in hypertensive participants with diabetes, heart failure or chronic kidney disease.;Long Term: Maintenance of blood pressure at goal levels.    Lipids  Yes    Intervention  Provide education and support for participant on nutrition & aerobic/resistive exercise along with prescribed medications to achieve LDL <7073mHDL >71m8m  Expected Outcomes  Short Term: Participant states understanding of desired  cholesterol values and is compliant with medications prescribed. Participant is following exercise prescription and nutrition guidelines.;Long Term: Cholesterol controlled with medications as prescribed, with individualized exercise RX and with personalized nutrition plan. Value goals: LDL < 3m, HDL > 40 mg.    Stress  Yes    Intervention  Offer individual and/or small group education and counseling on adjustment to heart disease, stress management and health-related lifestyle change. Teach and support self-help strategies.;Refer participants experiencing significant psychosocial distress to appropriate mental health specialists for further evaluation and treatment. When possible, include family members  and significant others in education/counseling sessions.    Expected Outcomes  Short Term: Participant demonstrates changes in health-related behavior, relaxation and other stress management skills, ability to obtain effective social support, and compliance with psychotropic medications if prescribed.;Long Term: Emotional wellbeing is indicated by absence of clinically significant psychosocial distress or social isolation.       Core Components/Risk Factors/Patient Goals Review:  Goals and Risk Factor Review    Row Name 07/30/17 0905 07/30/17 1113 08/22/17 1010         Core Components/Risk Factors/Patient Goals Review   Personal Goals Review  -  Weight Management/Obesity;Hypertension;Lipids;Diabetes  Weight Management/Obesity;Hypertension     Review  -  ALelon Frohlichhas been doing well in rehab.  Her weight was up today, but she has been stress eating.  Her blood pressures and blood sugars have been good.  She checks them at home routinely and tracks them for her doctor.  She feels her medications are working for her for the most part. She has a lot of stress and anxiety which tirggers chest pain.  She is going to talk with KJuliann Pulseabout this too.  [name] saw Dr KHumphrey Rollsand had an angiogram which she stated showed her heart is ok.  The PA cleared her to return to exercise.  She is still having some of the chest and neck burning so has met with GGertie FeyDoc.  She is having enzymes checked and is asking Dr for a referral to a vascular Dr.  She was advised to keep her exercise very lightly on her own.      Expected Outcomes  -  Short: Continue to work on weight loss and meet to talk about stress and aniexty.  Long: Continue to monitor and work on risk factor modifications.   Short - ALelon Frohlichwill follow up with Dr recommendations.  Long - Anns Dr will help he resolve the issues so she can exercise without worry.          Core Components/Risk Factors/Patient Goals at Discharge (Final Review):  Goals and Risk Factor Review -  08/22/17 1010      Core Components/Risk Factors/Patient Goals Review   Personal Goals Review  Weight Management/Obesity;Hypertension    Review  ALelon Frohlichsaw Dr KHumphrey Rollsand had an angiogram which she stated showed her heart is ok.  The PA cleared her to return to exercise.  She is still having some of the chest and neck burning so has met with GGertie FeyDoc.  She is having enzymes checked and is asking Dr for a referral to a vascular Dr.  She was advised to keep her exercise very lightly on her own.     Expected Outcomes  Short - ALelon Frohlichwill follow up with Dr recommendations.  Long - Anns Dr will help he resolve the issues so she can exercise without worry.         ITP Comments: ITP Comments  [place] Name 07/18/17 1325 08/08/17 0923 08/14/17 0606 09/11/17 0655     ITP Comments  medical review completed.  Initial ITP sent to Dr Elta Guadeloupe Sabra Heck for review, necessary changes and cosign.  Diagnosis documentation can be found in [facility]10/26/2018  [name] mentioned that she has had some "burning" in chest and neck similar to symptoms before her event.  She slowed down and the sensation went away.  She was advised to call her cardiologist today.  She saw him Monday but they did not discuss this symptom.  30 day review. Continue with ITP unless directed changes per Medical Director review.   30 day review. Continue with ITP unless directed changes per Medical Director review.        Comments:

## 2017-09-12 ENCOUNTER — Encounter: Payer: Medicare HMO | Attending: Cardiovascular Disease

## 2017-09-12 DIAGNOSIS — Z87891 Personal history of nicotine dependence: Secondary | ICD-10-CM | POA: Diagnosis not present

## 2017-09-12 DIAGNOSIS — Z881 Allergy status to other antibiotic agents status: Secondary | ICD-10-CM | POA: Diagnosis not present

## 2017-09-12 DIAGNOSIS — I10 Essential (primary) hypertension: Secondary | ICD-10-CM | POA: Insufficient documentation

## 2017-09-12 DIAGNOSIS — Z955 Presence of coronary angioplasty implant and graft: Secondary | ICD-10-CM | POA: Insufficient documentation

## 2017-09-12 DIAGNOSIS — Z882 Allergy status to sulfonamides status: Secondary | ICD-10-CM | POA: Diagnosis not present

## 2017-09-12 DIAGNOSIS — E785 Hyperlipidemia, unspecified: Secondary | ICD-10-CM | POA: Diagnosis not present

## 2017-09-12 DIAGNOSIS — Z794 Long term (current) use of insulin: Secondary | ICD-10-CM | POA: Diagnosis not present

## 2017-09-12 DIAGNOSIS — Z79899 Other long term (current) drug therapy: Secondary | ICD-10-CM | POA: Insufficient documentation

## 2017-09-12 DIAGNOSIS — J449 Chronic obstructive pulmonary disease, unspecified: Secondary | ICD-10-CM | POA: Insufficient documentation

## 2017-09-12 DIAGNOSIS — E119 Type 2 diabetes mellitus without complications: Secondary | ICD-10-CM | POA: Insufficient documentation

## 2017-09-12 DIAGNOSIS — I2510 Atherosclerotic heart disease of native coronary artery without angina pectoris: Secondary | ICD-10-CM | POA: Diagnosis not present

## 2017-09-12 DIAGNOSIS — Z888 Allergy status to other drugs, medicaments and biological substances status: Secondary | ICD-10-CM | POA: Diagnosis not present

## 2017-09-12 NOTE — Progress Notes (Signed)
Daily Session Note  Patient Details  Name: AMIAYA MCNEELEY MRN: [id number] Date of Birth: July 09, 1946 Referring Provider:     Cardiac Rehab from 07/18/2017 in [facility] Cardiac and Pulmonary Rehab  Referring Provider  khan      Encounter Date: 09/12/2017  Check In: Session Check In - 09/12/17 0841      Check-In   Location  [facility]-Cardiac & Pulmonary Rehab    Staff Present  Nada Maclachlan, BA, ACSM CEP, Exercise Physiologist;Carroll Enterkin, RN, Levie Heritage, MA, ACSM RCEP, Exercise Physiologist    Supervising physician immediately available to respond to emergencies  See telemetry face sheet for immediately available ER MD    Medication changes reported      No    Fall or balance concerns reported     No    Warm-up and Cool-down  Performed on first and last piece of equipment    Resistance Training Performed  Yes    VAD Patient?  No      Pain Assessment   Currently in Pain?  No/denies    Multiple Pain Sites  No          Social History   Tobacco Use  Smoking Status Former Smoker  . Packs/day: 2.00  . Years: 30.00  . Pack years: 60.00  . Last attempt to quit: 02/01/2000  . Years since quitting: 17.6  Smokeless Tobacco Never Used  Tobacco Comment   07/18/2017 former smoker quit 1999    Goals Met:  Independence with exercise equipment Exercise tolerated well No report of cardiac concerns or symptoms Strength training completed today  Goals Unmet:  Not Applicable  Comments: Pt able to follow exercise prescription today without complaint.  Will continue to monitor for progression.    Dr. Emily Filbert is Medical Director for [place] and [facility].

## 2017-09-17 ENCOUNTER — Encounter: Payer: Medicare HMO | Admitting: *Deleted

## 2017-09-17 DIAGNOSIS — Z955 Presence of coronary angioplasty implant and graft: Secondary | ICD-10-CM | POA: Diagnosis not present

## 2017-09-17 NOTE — Progress Notes (Signed)
Daily Session Note  Patient Details  Name: Tammy Hatfield MRN: [id number] Date of Birth: 1945-11-07 Referring Provider:     Cardiac Rehab from 07/18/2017 in [facility] Cardiac and Pulmonary Rehab  Referring Provider  khan      Encounter Date: 09/17/2017  Check In: Session Check In - 09/17/17 0832      Check-In   Location  [facility]-Cardiac & Pulmonary Rehab    Staff Present  Nada Maclachlan, BA, ACSM CEP, Exercise Physiologist;Susanne Bice, RN, BSN, CCRP;Joseph Darrin Nipper, [place], ACSM RCEP, Exercise Physiologist    Supervising physician immediately available to respond to emergencies  See telemetry face sheet for immediately available ER MD    Medication changes reported      No    Fall or balance concerns reported     No    Warm-up and Cool-down  Performed on first and last piece of equipment    Resistance Training Performed  Yes    VAD Patient?  No      Pain Assessment   Currently in Pain?  No/denies          Social History   Tobacco Use  Smoking Status Former Smoker  . Packs/day: 2.00  . Years: 30.00  . Pack years: 60.00  . Last attempt to quit: 02/01/2000  . Years since quitting: 17.6  Smokeless Tobacco Never Used  Tobacco Comment   07/18/2017 former smoker quit 1999    Goals Met:  Independence with exercise equipment Exercise tolerated well No report of cardiac concerns or symptoms Strength training completed today  Goals Unmet:  Not Applicable  Comments: Pt able to follow exercise prescription today without complaint.  Will continue to monitor for progression.    Dr. Emily Filbert is Medical Director for [place] and [facility].

## 2017-09-19 VITALS — BP 148/68 | HR 94

## 2017-09-19 DIAGNOSIS — Z955 Presence of coronary angioplasty implant and graft: Secondary | ICD-10-CM | POA: Diagnosis not present

## 2017-09-19 NOTE — Progress Notes (Signed)
Daily Session Note  Patient Details  Name: Tammy Hatfield MRN: [id number] Date of Birth: [birth year] Referring Provider:     Cardiac Rehab from 07/18/2017 in [facility] Cardiac and Pulmonary Rehab  Referring Provider  khan      Encounter Date: 09/19/2017  Check In: Session Check In - 09/19/17 3235      Check-In   Location  [facility]-Cardiac & Pulmonary Rehab    Staff Present  Renita Papa, RN Vickki Hearing, BA, ACSM CEP, Exercise Physiologist;Carroll Guy Begin, RN, BSN    Supervising physician immediately available to respond to emergencies  See telemetry face sheet for immediately available ER MD    Medication changes reported      No    Fall or balance concerns reported     No    Warm-up and Cool-down  Performed on first and last piece of equipment    Resistance Training Performed  Yes    VAD Patient?  No      Pain Assessment   Currently in Pain?  No/denies    Multiple Pain Sites  No          Social History   Tobacco Use  Smoking Status Former Smoker  . Packs/day: 2.00  . Years: 30.00  . Pack years: 60.00  . Last attempt to quit: 02/01/2000  . Years since quitting: 17.6  Smokeless Tobacco Never Used  Tobacco Comment   07/18/2017 former smoker quit 1999    Goals Met:  Independence with exercise equipment Exercise tolerated well No report of cardiac concerns or symptoms Strength training completed today  Goals Unmet:  Not Applicable  Comments: Pt able to follow exercise prescription today without complaint.  Will continue to monitor for progression.    Dr. Emily Filbert is Medical Director for [place] and [facility].

## 2017-09-19 NOTE — Progress Notes (Signed)
Cardiac Individual Treatment Plan  Patient Details  Name: Tammy Hatfield MRN: [id number] Date of Birth: [birth year] Referring Provider:     Cardiac Rehab from 07/18/2017 in [facility] Cardiac and Pulmonary Rehab  Referring Provider  khan      Initial Encounter Date:    Cardiac Rehab from 07/18/2017 in [facility] Cardiac and Pulmonary Rehab  Date  07/18/17  Referring Provider  khan      Visit Diagnosis: Status post coronary artery stent placement  Patient's Home Medications on Admission:  Current Outpatient Medications:  .  albuterol (PROVENTIL HFA;VENTOLIN HFA) 108 (90 BASE) MCG/ACT inhaler, Inhale 2 puffs into the lungs every 4 (four) hours as needed for wheezing or shortness of breath., Disp: , Rfl:  .  ALPRAZolam (XANAX) 0.5 MG tablet, Take 0.5 mg by mouth 2 (two) times daily as needed for anxiety., Disp: , Rfl:  .  aspirin 81 MG chewable tablet, Chew 1 tablet (81 mg total) by mouth daily., Disp: , Rfl:  .  glipiZIDE (GLUCOTROL) 10 MG tablet, Take 10 mg by mouth daily before breakfast., Disp: , Rfl:  .  insulin NPH-regular Human (NOVOLIN 70/30) (70-30) 100 UNIT/ML injection, Inject 15-25 Units into the skin 2 (two) times daily with a meal. , Disp: , Rfl:  .  isosorbide mononitrate (IMDUR) 30 MG 24 hr tablet, Take 1 tablet (30 mg total) by mouth daily., Disp: 30 tablet, Rfl: 0 .  loratadine (CLARITIN) 10 MG tablet, Take 10 mg by mouth daily as needed. , Disp: , Rfl:  .  losartan (COZAAR) 50 MG tablet, Take 50 mg by mouth every morning. , Disp: , Rfl:  .  metoprolol succinate (TOPROL-XL) 25 MG 24 hr tablet, , Disp: , Rfl:  .  metoprolol tartrate (LOPRESSOR) 25 MG tablet, Take 1 tablet (25 mg total) by mouth 2 (two) times daily., Disp: 60 tablet, Rfl: 0 .  nitroGLYCERIN (NITROSTAT) 0.4 MG SL tablet, Place 1 tablet (0.4 mg total) under the tongue every 5 (five) minutes as needed for chest pain., Disp: 30 tablet, Rfl: 0 .  omeprazole (PRILOSEC) 20 MG capsule, Take 20 mg by mouth 2 (two) times  daily. , Disp: , Rfl:  .  PARoxetine (PAXIL) 40 MG tablet, Take 20 mg by mouth 2 (two) times daily. , Disp: , Rfl:  .  ticagrelor (BRILINTA) 90 MG TABS tablet, Take 1 tablet (90 mg total) by mouth 2 (two) times daily., Disp: 60 tablet, Rfl: 0 .  traMADol (ULTRAM) 50 MG tablet, Take 50 mg by mouth every 6 (six) hours as needed for moderate pain. , Disp: , Rfl:  .  valACYclovir (VALTREX) 500 MG tablet, TAKE ONE CAPLET BY MOUTH TWICE DAILY FOR 3 DAYS AS NEEDED FOR  FLARE, Disp: , Rfl:   Past Medical History: Past Medical History:  Diagnosis Date  . Arthritis    knees  . Barrett's esophagus   . Breast cancer ([place]) 2014   RT LUMPECTOMY  . Cancer [facility]) 2014   right breast - s\p Radiation and lumpectomy and LN dissection  . Chronic sinusitis   . COPD (chronic obstructive pulmonary disease) ([place]) 2012  . Degenerative lumbar disc   . Diabetes mellitus without complication ([place])   . Dysphagia   . Esophageal reflux   . History of Helicobacter pylori infection   . Hypercholesteremia   . Hyperlipidemia   . Hypertension   . Personal history of radiation therapy   . Radiation 2014   RT BREAST CA  . Ulcer, stomach peptic  history of    Tobacco Use: Social History   Tobacco Use  Smoking Status Former Smoker  . Packs/day: 2.00  . Years: 30.00  . Pack years: 60.00  . Last attempt to quit: 02/01/2000  . Years since quitting: 17.6  Smokeless Tobacco Never Used  Tobacco Comment   07/18/2017 former smoker quit 1999    Labs: Recent Review Flowsheet Data    Labs for ITP Cardiac and Pulmonary Rehab Latest Ref Rng & Units 02/11/2015 07/05/2017   Cholestrol 0 - 200 mg/dL - 255(H)   LDLCALC 0 - 99 mg/dL - 147(H)   HDL >40 mg/dL - 62   Trlycerides <150 mg/dL - 231(H)   Hemoglobin A1c 4.0 - 6.0 % 7.4(H) -       Exercise Target Goals:    Exercise Program Goal: Individual exercise prescription set with THRR, safety & activity barriers. Participant demonstrates ability to understand and  report RPE using BORG scale, to self-measure pulse accurately, and to acknowledge the importance of the exercise prescription.  Exercise Prescription Goal: Starting with aerobic activity 30 plus minutes a day, 3 days per week for initial exercise prescription. Provide home exercise prescription and guidelines that participant acknowledges understanding prior to discharge.  Activity Barriers & Risk Stratification: Activity Barriers & Cardiac Risk Stratification - 07/18/17 1331      Activity Barriers & Cardiac Risk Stratification   Activity Barriers  Arthritis;Back Problems;Chest Pain/Angina;History of Falls;Joint Problems BOth knees "bone on bone"  needs surgery. Disc disease, bulging disc, arthritis all over.  Angina with exertion, History of falls because of "knees going out"    Cardiac Risk Stratification  High       6 Minute Walk: 6 Minute Walk    Row Name 07/18/17 1429         6 Minute Walk   Distance  912 feet     Walk Time  6 minutes     # of Rest Breaks  0     MPH  1.72     METS  1.8     RPE  14     Perceived Dyspnea   3     VO2 Peak  6.3     Symptoms  No     Resting HR  68 bpm     Resting BP  118/62     Resting Oxygen Saturation   95 %     Exercise Oxygen Saturation  during 6 min walk  95 %     Max Ex. HR  100 bpm     Max Ex. BP  156/66     2 Minute Post BP  130/64        Oxygen Initial Assessment:   Oxygen Re-Evaluation:   Oxygen Discharge (Final Oxygen Re-Evaluation):   Initial Exercise Prescription: Initial Exercise Prescription - 07/18/17 1400      Date of Initial Exercise RX and Referring Provider   Date  07/18/17    Referring Provider  khan      Treadmill   MPH  1.5    Grade  0    Minutes  15    METs  1.8      Recumbant Bike   Level  1    RPM  60    Watts  5    Minutes  15    METs  1.8      NuStep   Level  2    SPM  80    Minutes  15  METs  1.8      REL-XR   Level  1    Speed  50    Minutes  15    METs  1.8       Prescription Details   Frequency (times per week)  3    Duration  Progress to 45 minutes of aerobic exercise without signs/symptoms of physical distress      Intensity   THRR 40-80% of Max Heartrate  100-132    Ratings of Perceived Exertion  11-13    Perceived Dyspnea  0-4      Resistance Training   Training Prescription  Yes    Weight  2 lbs    Reps  10-15       Perform Capillary Blood Glucose checks as needed.  Exercise Prescription Changes: Exercise Prescription Changes    Row Name 07/18/17 1300 07/23/17 1500 08/06/17 1400 08/21/17 1400 09/02/17 1300     Response to Exercise   Blood Pressure (Admit)  118/62  110/62  126/74  120/62  134/76   Blood Pressure (Exercise)  156/66  146/72  130/54  124/72  146/64   Blood Pressure (Exit)  130/64  136/76  122/64  134/70  92/52   Heart Rate (Admit)  72 bpm  87 bpm  84 bpm  63 bpm  84 bpm   Heart Rate (Exercise)  100 bpm  105 bpm  99 bpm  97 bpm  112 bpm   Heart Rate (Exit)  71 bpm  79 bpm  82 bpm  74 bpm  81 bpm   Rating of Perceived Exertion (Exercise)  _0 Symptoms  -  none  none  none  none   Comments  -  first full day of exercise  -  -  -   Duration  -  Progress to 45 minutes of aerobic exercise without signs/symptoms of physical distress  Progress to 45 minutes of aerobic exercise without signs/symptoms of physical distress  Continue with 45 min of aerobic exercise without signs/symptoms of physical distress.  Continue with 45 min of aerobic exercise without signs/symptoms of physical distress.   Intensity  -  THRR unchanged  THRR unchanged  THRR unchanged  THRR unchanged     Progression   Progression  -  Continue to progress workloads to maintain intensity without signs/symptoms of physical distress.  Continue to progress workloads to maintain intensity without signs/symptoms of physical distress.  Continue to progress workloads to maintain intensity without signs/symptoms of physical distress.  Continue to  progress workloads to maintain intensity without signs/symptoms of physical distress.   Average METs  -  1.98  2.05  2.05  2.48     Resistance Training   Training Prescription  -  Yes  Yes  Yes  Yes   Weight  -  2 lbs  2 lbs  2 lbs  2 lbs   Reps  -  10-15  10-15  10-15  10-15     Interval Training   Interval Training  -  No  No  No  No     Treadmill   MPH  -  1.5  1.5  1.5  1.5   Grade  -  0  0  0  0   Minutes  -  _1 METs  -  1.8  2.15  2.15  2.15     NuStep  Level  -  -  _0 Minutes  -  -  _1 METs  -  -  1.8  1.8  2.5     REL-XR   Level  -  _2 Minutes  -  _3 METs  -  1.8  2.2  2.2  2.8     Home Exercise Plan   Plans to continue exercise at  -  -  -  Home (comment) walking and gym in complex  Home (comment) walking and gym in complex   Frequency  -  -  -  Add 2 additional days to program exercise sessions.  Add 2 additional days to program exercise sessions.   Initial Home Exercises Provided  -  -  -  08/13/17  08/13/17   Row Name 09/17/17 1700             Response to Exercise   Blood Pressure (Admit)  134/64       Blood Pressure (Exercise)  144/62       Blood Pressure (Exit)  112/60       Heart Rate (Admit)  85 bpm       Heart Rate (Exercise)  98 bpm       Heart Rate (Exit)  77 bpm       Rating of Perceived Exertion (Exercise)  12       Symptoms  none       Duration  Continue with 30 min of aerobic exercise without signs/symptoms of physical distress.       Intensity  THRR unchanged         Progression   Progression  Continue to progress workloads to maintain intensity without signs/symptoms of physical distress.       Average METs  2.88         Resistance Training   Training Prescription  Yes       Weight  2 lbs       Reps  10-15         Interval Training   Interval Training  No         Treadmill   MPH  1.5       Grade  0       Minutes  15       METs  2.15         NuStep   Level  3        Minutes  15       METs  2.6         REL-XR   Level  4       Minutes  15       METs  3.9         Home Exercise Plan   Plans to continue exercise at  Home (comment) walking and gym in complex       Frequency  Add 2 additional days to program exercise sessions.       Initial Home Exercises Provided  08/13/17          Exercise Comments: Exercise Comments    Row Name 07/23/17 7680 08/08/17 0921 08/13/17 1104       Exercise Comments  First full day of exercise!  Patient was oriented to gym and equipment including functions, settings, policies, and procedures.  Patient's individual exercise  prescription and treatment plan were reviewed.  All starting workloads were established based on the results of the 6 minute walk test done at initial orientation visit.  The plan for exercise progression was also introduced and progression will be customized based on patient's performance and goals.  Tammy Hatfield mentioned that she has had some "burning" in chest and neck similar to symptoms before her event.  She slowed down and the sensation went away.  She was advised to call her cardiologist today.  She saw him Monday but they did not discuss this symptom.   Reviewed home exercise with pt today.  Pt plans to use facility where she lives for exercise.  Reviewed THR, pulse, RPE, sign and symptoms, NTG use, and when to call 911 or MD.  Also discussed weather considerations and indoor options.  Pt voiced understanding.  Tammy Hatfield saw her Dr last week after having s/s - will get stress test results today.  Advised her to follow Dr orders about exercise and follow up with staff Thursday.        Exercise Goals and Review: Exercise Goals    Row Name 07/18/17 1427             Exercise Goals   Intervention  Provide advice, education, support and counseling about physical activity/exercise needs.;Develop an individualized exercise prescription for aerobic and resistive training based on initial evaluation findings, risk  stratification, comorbidities and participant's personal goals.       Expected Outcomes  Achievement of increased cardiorespiratory fitness and enhanced flexibility, muscular endurance and strength shown through measurements of functional capacity and personal statement of participant.       Increase Strength and Stamina  Yes       Intervention  Provide advice, education, support and counseling about physical activity/exercise needs.;Develop an individualized exercise prescription for aerobic and resistive training based on initial evaluation findings, risk stratification, comorbidities and participant's personal goals.       Expected Outcomes  Achievement of increased cardiorespiratory fitness and enhanced flexibility, muscular endurance and strength shown through measurements of functional capacity and personal statement of participant.       Able to understand and use rate of perceived exertion (RPE) scale  Yes       Intervention  Provide education and explanation on how to use RPE scale       Expected Outcomes  Short Term: Able to use RPE daily in rehab to express subjective intensity level;Long Term:  Able to use RPE to guide intensity level when exercising independently       Able to understand and use Dyspnea scale  Yes       Intervention  Provide education and explanation on how to use Dyspnea scale       Expected Outcomes  Short Term: Able to use Dyspnea scale daily in rehab to express subjective sense of shortness of breath during exertion;Long Term: Able to use Dyspnea scale to guide intensity level when exercising independently       Knowledge and understanding of Target Heart Rate Range (THRR)  Yes       Intervention  Provide education and explanation of THRR including how the numbers were predicted and where they are located for reference       Expected Outcomes  Short Term: Able to state/look up THRR;Long Term: Able to use THRR to govern intensity when exercising independently;Short Term:  Able to use daily as guideline for intensity in rehab       Able to  check pulse independently  Yes       Intervention  Provide education and demonstration on how to check pulse in carotid and radial arteries.;Review the importance of being able to check your own pulse for safety during independent exercise       Expected Outcomes  Short Term: Able to explain why pulse checking is important during independent exercise;Long Term: Able to check pulse independently and accurately       Understanding of Exercise Prescription  Yes       Intervention  Provide education, explanation, and written materials on patient's individual exercise prescription       Expected Outcomes  Short Term: Able to explain program exercise prescription;Long Term: Able to explain home exercise prescription to exercise independently          Exercise Goals Re-Evaluation : Exercise Goals Re-Evaluation    Row Name 07/23/17 0857 07/30/17 1111 08/06/17 1438 08/13/17 1104 08/21/17 1446     Exercise Goal Re-Evaluation   Exercise Goals Review  Knowledge and understanding of Target Heart Rate Range (THRR);Able to understand and use rate of perceived exertion (RPE) scale;Understanding of Exercise Prescription  Increase Physical Activity;Understanding of Exercise Prescription;Increase Strength and Stamina  Increase Physical Activity;Understanding of Exercise Prescription;Increase Strength and Stamina  Increase Physical Activity;Increase Strength and Stamina  Increase Physical Activity;Increase Strength and Stamina;Understanding of Exercise Prescription   Comments  Reviewed RPE scale, THR and program prescription with pt today.  Pt voiced understanding and was given a copy of goals to take home.  Tammy Hatfield is off to a good start in rehab.  She is not currently doing any exercise at home, but we will add this in soon.  She is excited about coming to class to get help and time to herself. She will benefit from the program in many ways from exercise  to support!!  Tammy Hatfield has been doing well in rehab.  She enjoyed her thanksgiving holiday .  She is improved to 2.2 METs on the NuStep.  We will continue to monitor her progress.   Reviewed home exercise with pt today.  Pt plans to use facility where she lives for exercise.  Reviewed THR, pulse, RPE, sign and symptoms, NTG use, and when to call 911 or MD.  Also discussed weather considerations and indoor options.  Pt voiced understanding.  Tammy Hatfield saw her Dr last week after having s/s - will get stress test results today.  Advised her to follow Dr orders about exercise and follow up with staff Thursday.  Tammy Hatfield has been doing well in rehab.  She has been out with testing at the doctor's office but has been cleared to return.  We will get her back on her workloads and begin to start to push a little more.  We will continue to monitor her progress.   Expected Outcomes  Short: Use RPE daily to regulate intensity.  Long: Follow program prescription in THR.  Short: Review home exercise guidelines.  Long: Continue to attend classes regularly to work on strength and stamina.   Short: Review home exercise guidelines.  Long: Continue to attend classes regularly to work on strength and stamina.   Short - Tammy Hatfield will add exercise at home pending Dr clearance after stress test.  Long - Tammy Hatfield will exercise independently.  Short:  Tammy Hatfield will add in exercsie home.  Long: Continue to increase strength and stamina.    [place] Name 08/22/17 1007 09/02/17 1305 09/17/17 1704         Exercise Goal  Re-Evaluation   Exercise Goals Review  Increase Physical Activity;Increase Strength and Stamina  Increase Physical Activity;Increase Strength and Stamina;Understanding of Exercise Prescription  Increase Physical Activity;Increase Strength and Stamina;Understanding of Exercise Prescription     Comments  Tammy Hatfield hasn't started exercise at home yet due to snow and not being able to get to the exercise room.  Tammy Hatfield has been doing well in rehab.  She is coming  consistently and working hard.  We need to encourage her to try to get in at least one extra day a week at home.  She was able to move up to level 4 on the XR.  We will continue to monitor her progression.   Tammy Hatfield has continued to do well in rehab.  She is getting [facility] by [facility] to continue to exercise and help her feet feel better.  She was excited to hear about getting her new shoes approved.  We will continue to monitor her progress.      Expected Outcomes  Short - Tammy Hatfield will begin exercise when the snow is cleared.  Long - Tammy Hatfield will exercise on her own.  Short: Exercise at home routinely.  Long: Continue to increase activity levels.   Short: Get new shoes and increase workload on treadmill.   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         Discharge Exercise Prescription (Final Exercise Prescription Changes): Exercise Prescription Changes - 09/17/17 1700      Response to Exercise   Blood Pressure (Admit)  134/64    Blood Pressure (Exercise)  144/62    Blood Pressure (Exit)  112/60    Heart Rate (Admit)  85 bpm    Heart Rate (Exercise)  98 bpm    Heart Rate (Exit)  77 bpm    Rating of Perceived Exertion (Exercise)  12    Symptoms  none    Duration  Continue with 30 min of aerobic exercise without signs/symptoms of physical distress.    Intensity  THRR unchanged      Progression   Progression  Continue to progress workloads to maintain intensity without signs/symptoms of physical distress.    Average METs  2.88      Resistance Training   Training Prescription  Yes    Weight  2 lbs    Reps  10-15      Interval Training   Interval Training  No      Treadmill   MPH  1.5    Grade  0    Minutes  15    METs  2.15      NuStep   Level  3    Minutes  15    METs  2.6      REL-XR   Level  4    Minutes  15    METs  3.9      Home Exercise Plan   Plans to continue exercise at  Home (comment) walking and gym in complex    Frequency  Add 2 additional  days to program exercise sessions.    Initial Home Exercises Provided  08/13/17       Nutrition:  Target Goals: Understanding of nutrition guidelines, daily intake of sodium <1578m, cholesterol <2091m calories 30% from fat and 7% or less from saturated fats, daily to have 5 or more servings of fruits and vegetables.  Biometrics: Pre Biometrics - 07/18/17 1427      Pre Biometrics   Height  5'  4.5" (1.638 m)    Weight  218 lb (98.9 kg)    Waist Circumference  40 inches    Hip Circumference  49.75 inches    Waist to Hip Ratio  0.8 %    BMI (Calculated)  36.86    Single Leg Stand  5.31 seconds        Nutrition Therapy Plan and Nutrition Goals: Nutrition Therapy & Goals - 07/18/17 1332      Intervention Plan   Intervention  Prescribe, educate and counsel regarding individualized specific dietary modifications aiming towards targeted core components such as weight, hypertension, lipid management, diabetes, heart failure and other comorbidities.    Expected Outcomes  Short Term Goal: A plan has been developed with personal nutrition goals set during dietitian appointment.;Short Term Goal: Understand basic principles of dietary content, such as calories, fat, sodium, cholesterol and nutrients.;Long Term Goal: Adherence to prescribed nutrition plan.       Nutrition Discharge: Rate Your Plate Scores: Nutrition Assessments - 09/19/17 1101      MEDFICTS Scores   Pre Score  89    Post Score  83    Score Difference  -6       Nutrition Goals Re-Evaluation: Nutrition Goals Re-Evaluation    Row Name 07/30/17 1120 08/22/17 1008           Goals   Nutrition Goal  Make nutrition appointment after [place] would like to lose weight, but she has a lot going on and needs some help in order to focus on this.  Tammy Hatfield wants to schedule with RD but has missed phone calls.      Expected Outcome  Short: make appointment.  Long: Follow heart healthy diet  Short - Tammy Hatfield will  meet with the RD ASAP.  Long - Tammy Hatfield will follow RD advice.         Nutrition Goals Discharge (Final Nutrition Goals Re-Evaluation): Nutrition Goals Re-Evaluation - 08/22/17 1008      Goals   Comment  Tammy Hatfield wants to schedule with RD but has missed phone calls.    Expected Outcome  Short - Tammy Hatfield will meet with the RD ASAP.  Long - Tammy Hatfield will follow RD advice.       Psychosocial: Target Goals: Acknowledge presence or absence of significant depression and/or stress, maximize coping skills, provide positive support system. Participant is able to verbalize types and ability to use techniques and skills needed for reducing stress and depression.   Initial Review & Psychosocial Screening: Initial Psych Review & Screening - 07/18/17 1332      Initial Review   Current issues with  Current Depression;History of Depression;Current Psychotropic Meds;Current Sleep Concerns;Current Stress Concerns    Source of Stress Concerns  Financial;Family    Comments  Husband at [facility] for Dementia. Had to file for Medicaid-fianances hard.     Lives alone.  Has great support from children      [place]?  Yes 2 daughters and 1 son      Barriers   Psychosocial barriers to participate in program  There are no identifiable barriers or psychosocial needs.;The patient should benefit from training in stress management and relaxation.      Screening Interventions   Interventions  Encouraged to exercise;To provide support and resources with identified psychosocial needs;Provide feedback about the scores to participant;Program counselor consult;Yes    Expected Outcomes  Long Term Goal: Stressors or current  issues are controlled or eliminated.;Short Term goal: Utilizing psychosocial counselor, staff and physician to assist with identification of specific Stressors or current issues interfering with healing process. Setting desired goal for each stressor or current issue identified.;Short Term goal:  Identification and review with participant of any Quality of Life or Depression concerns found by scoring the questionnaire.;Long Term goal: The participant improves quality of Life and PHQ9 Scores as seen by post scores and/or verbalization of changes Reviewed scores with Tammy Hatfield. Talked to her about how attending the program and taking steps to care for herself may help improve her scores.         Quality of Life Scores:  Quality of Life - 09/19/17 1101      Quality of Life Scores   Health/Function Pre  10.18 %    Health/Function Post  17.07 %    Health/Function % Change  67.68 %    Socioeconomic Pre  16.25 %    Socioeconomic Post  18 %    Socioeconomic % Change   10.77 %    Psych/Spiritual Pre  10.1 %    Psych/Spiritual Post  24.43 %    Psych/Spiritual % Change  141.88 %    Family Pre  15 %    Family Post  12.75 %    Family % Change  -15 %    GLOBAL Pre  11.78 %    GLOBAL Post  18.37 %    GLOBAL % Change  55.94 %       PHQ-9: Recent Review Flowsheet Data    Depression screen [facility] 2/9 09/19/2017 07/18/2017   Decreased Interest 0 0   Down, Depressed, Hopeless 1 0   PHQ - 2 Score 1 0   Altered sleeping 1 1    Tired, decreased energy 1 3   Change in appetite 1 2   Feeling bad or failure about yourself  0 0   Trouble concentrating 0 0   Moving slowly or fidgety/restless 1 0   Suicidal thoughts 0 0   PHQ-9 Score 5 6   Difficult doing work/chores - Not difficult at all     Interpretation of Total Score  Total Score Depression Severity:  1-4 = Minimal depression, 5-9 = Mild depression, 10-14 = Moderate depression, 15-19 = Moderately severe depression, 20-27 = Severe depression   Psychosocial Evaluation and Intervention: Psychosocial Evaluation - 07/31/17 1720      Psychosocial Evaluation & Interventions   Interventions  Stress management education;Therapist referral;Encouraged to exercise with the program and follow exercise prescription;Relaxation education    Comments   Counselor met with Ms. Raliegh Ip Tammy Hatfield) for initial psychosocial evaluation.  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  Tammy Hatfield has a strong support system with her two daughters and a son who live locally.  She has 6 grandchildren and 6 Great grandchildren.  Tammy Hatfield also has a spouse who has had dementia since 2010 and is currently in a nursing home.  Tammy Hatfield has struggled with her own health issues in addition to her heart condition, she is a breast cancer survivor for 3 years now cancer free.  She is tired a lot and is on new medications that are "too expensive" as Tammy Hatfield lives on a fixed and limited income.  She has a history of depression and anxiety and is on medications for this; but reports feeling "edgy often."  Tammy Hatfield states her mood is "stable" but she was very tearful  today discussing her stressors - particularly financial.  She has goals to be healthier; improve her diet and lose some weight while in this program.  Counselor provided some stress management strategies for Tammy Hatfield today and the class practiced mindfulness and relaxation altogether.  This counselor recommended Tammy Hatfield to see a therapist and she is concerned about the cost of this.  Counselor will look into options and get those to [place] next week.  She agreed to give each of her adult children something they can do to help alleviate some stress tomorrow when she sees them for Thanksgiving.  Tammy Hatfield is hesitant to allow others to know her needs and tries to be strong.  But it is time to allow others to help. She agreed to do so.  Counselor will follow with Tammy Hatfield re: this and overall while she is in this program.      Expected Outcomes  Tammy Hatfield will benefit from consistent exercise to achieve her stated goals.  She will meet with the dietician to address her weight loss goal.  Tammy Hatfield agreed to see a therapist as long as there is no co-pay involved.  Counselor will research and provide info on this next week.  Tammy Hatfield will begin sharing her needs with  others more openly to help her through this stressful time.      Continue Psychosocial Services   Follow up required by counselor       Psychosocial Re-Evaluation: Psychosocial Re-Evaluation    Sherrill Name 07/30/17 [phone] 08/06/17 0953 08/22/17 0840 09/12/17 1055 09/17/17 0947     Psychosocial Re-Evaluation   Current issues with  Current Depression;Current Anxiety/Panic;Current Stress Concerns  Current Anxiety/Panic;History of Depression;Current Depression;Current Stress Concerns  -  Current Stress Concerns  -   Comments  Tammy Hatfield broke down today while talking about how she is doing.  Her husband is in [place] with dementia since 2010.  She only goes to see him once a week as it is too hard on her mentally to see him.  He has a minimal lucid moment and usally does not recognize her.  Their 51 year anniversary was on Sunday and he was not aware at all.  This really hit her hard.  She is also stuggling financially with caring for him, paying for care, and paying for her medications.  I gave her contact information for the medication management clinic to get help with her medications.  She also really needs to talk with Juliann Pulse to establish care with a psychologist to work on her anxiety and depression.  She has symptoms of both.  Her anxiety has now reached a point of causing chest pain any time she gets upset over her husband or finances.  She is also stressed by juggling all of her medical care and appointments.  And to add to it, she would like to remain strong for her family.  She has always been the strong matriarch of her family, she does not want to burden her kids but they want to know what is going on.  She got teary eyed while we discussing everything.  I encouraged her to get involved with her classmates and with heart sisters to establish a support system with similar circumstances.  She is excited about what we can do to help.  She would also like help to find a cheaper place to live to help with  finances.  Counselor follow up today with Tammy Hatfield.  She reports having a great thanksgiving and was in a better mood today -  more relaxed.  Counselor provided contact information for a therapist for Tammy Hatfield and she appeared relieved to know there is someone to help her during this time.  She agreed to call today to set an appointment.  Counselor will continue to follow.    Counselor follow up with Tammy Hatfield reporting feeling better emotionally since began this class.  She is realizing the value of self care.  Tammy Hatfield has called the therapist this counselor suggested, but no response yet.  Counselor left a message for the therapist and suggested Tammy Hatfield try her again.  Counselor commended Tammy Hatfield for her positive self care and exercising consistently.  Counselor will continue to follow.    Tammy Hatfield mentioned to staff that her husband passed away Nov 05, 2022.  She stated that has taken a load off her stress wise.    Counselor follow up today with Tammy Hatfield reporting her spouse passed the end of December.  She states her mood and sleep have improved since that time as she was so worried and concerned about him all the time.  She has a different type of stress currently with finalizing all the details of his estate, but she is exercising consistently and feels she can take that one day at a time.  Counselor commended Tammy Hatfield for her positive attitude and her self-care.  She reports no longer a need for a counselor and will not pursue that option further.     Expected Outcomes  Short: Meet with Juliann Pulse next week.  Long: Get help with anxiety and depression.   Tammy Hatfield will call the therapist to begin counseling services to help her through this stressful time in her life.    Tammy Hatfield will continue exercising for her health and mental health and practice positive self care.  She will call the therapist again to begin mental health support.  Short - Tammy Hatfield will continue to attend HT to help manage stress Long - Tammy Hatfield will be better able to practice self care   -   Interventions  -   Therapist referral;Stress management education  Relaxation education  -  -   Continue Psychosocial Services   Follow up required by counselor  Follow up required by counselor  Follow up required by counselor  -  Follow up required by staff   Comments  Husband at [facility] for Dementia. Had to file for Medicaid-fianances hard.     Lives alone.  Has great support from children  -  -  -  -     Initial Review   Source of Stress Concerns  Chronic Illness;Unable to participate in former interests or hobbies;Family;Financial  -  -  -  -      Psychosocial Discharge (Final Psychosocial Re-Evaluation): Psychosocial Re-Evaluation - 09/17/17 0947      Psychosocial Re-Evaluation   Comments  Counselor follow up today with Tammy Hatfield reporting her spouse passed the end of December.  She states her mood and sleep have improved since that time as she was so worried and concerned about him all the time.  She has a different type of stress currently with finalizing all the details of his estate, but she is exercising consistently and feels she can take that one day at a time.  Counselor commended Tammy Hatfield for her positive attitude and her self-care.  She reports no longer a need for a counselor and will not pursue that option further.      Continue Psychosocial Services   Follow up required by staff  Vocational Rehabilitation: Provide vocational rehab assistance to qualifying candidates.   Vocational Rehab Evaluation & Intervention: Vocational Rehab - 07/18/17 1340      Initial Vocational Rehab Evaluation & Intervention   Assessment shows need for Vocational Rehabilitation  No       Education: Education Goals: Education classes will be provided on a variety of topics geared toward better understanding of heart health and risk factor modification. Participant will state understanding/return demonstration of topics presented as noted by education test scores.  Learning Barriers/Preferences: Learning  Barriers/Preferences - 07/18/17 1339      Learning Barriers/Preferences   Learning Barriers  None    Learning Preferences  Individual Instruction;Verbal Instruction       Education Topics: General Nutrition Guidelines/Fats and Fiber: -Group instruction provided by verbal, written material, models and posters to present the general guidelines for heart healthy nutrition. Gives an explanation and review of dietary fats and fiber.   Cardiac Rehab from 09/19/2017 in [facility] Cardiac and Pulmonary Rehab  Date  08/27/17  Educator  CR  Instruction Review Code  1- Verbalizes Understanding      Controlling Sodium/Reading Food Labels: -Group verbal and written material supporting the discussion of sodium use in heart healthy nutrition. Review and explanation with models, verbal and written materials for utilization of the food label.   Cardiac Rehab from 09/19/2017 in [facility] Cardiac and Pulmonary Rehab  Date  08/27/17  Educator  CR  Instruction Review Code  1- Verbalizes Understanding      Exercise Physiology & Risk Factors: - Group verbal and written instruction with models to review the exercise physiology of the cardiovascular system and associated critical values. Details cardiovascular disease risk factors and the goals associated with each risk factor.   Cardiac Rehab from 09/19/2017 in [facility] Cardiac and Pulmonary Rehab  Date  09/05/17  Educator  [facility]  Instruction Review Code  1- Verbalizes Understanding      Aerobic Exercise & Resistance Training: - Gives group verbal and written discussion on the health impact of inactivity. On the components of aerobic and resistive training programs and the benefits of this training and how to safely progress through these programs.   Cardiac Rehab from 09/19/2017 in [facility] Cardiac and Pulmonary Rehab  Date  09/12/17  Educator  [facility]  Instruction Review Code  1- Verbalizes Understanding      Flexibility, Balance, General Exercise Guidelines: - Provides  group verbal and written instruction on the benefits of flexibility and balance training programs. Provides general exercise guidelines with specific guidelines to those with heart or lung disease. Demonstration and skill practice provided.   Cardiac Rehab from 09/19/2017 in [facility] Cardiac and Pulmonary Rehab  Date  09/17/17  Educator  AS  Instruction Review Code  1- Verbalizes Understanding      Stress Management: - Provides group verbal and written instruction about the health risks of elevated stress, cause of high stress, and healthy ways to reduce stress.   Cardiac Rehab from 09/19/2017 in [facility] Cardiac and Pulmonary Rehab  Date  07/23/17  Educator  [facility]  Instruction Review Code  1- Verbalizes Understanding      Depression: - Provides group verbal and written instruction on the correlation between heart/lung disease and depressed mood, treatment options, and the stigmas associated with seeking treatment.   Anatomy & Physiology of the Heart: - Group verbal and written instruction and models provide basic cardiac anatomy and physiology, with the coronary electrical and arterial systems. Review of: AMI, Angina, Valve disease, Heart  Failure, Cardiac Arrhythmia, Pacemakers, and the ICD.   Cardiac Rehab from 09/19/2017 in [facility] Cardiac and Pulmonary Rehab  Date  07/25/17  Educator  KS  Instruction Review Code  1- Verbalizes Understanding      Cardiac Procedures: - Group verbal and written instruction to review commonly prescribed medications for heart disease. Reviews the medication, class of the drug, and side effects. Includes the steps to properly store meds and maintain the prescription regimen. (beta blockers and nitrates)   Cardiac Rehab from 09/19/2017 in [facility] Cardiac and Pulmonary Rehab  Date  07/30/17  Educator  SB  Instruction Review Code  1- Verbalizes Understanding      Cardiac Medications I: - Group verbal and written instruction to review commonly prescribed medications for  heart disease. Reviews the medication, class of the drug, and side effects. Includes the steps to properly store meds and maintain the prescription regimen.   Cardiac Rehab from 09/19/2017 in [facility] Cardiac and Pulmonary Rehab  Date  08/06/17  Educator  [place]  Instruction Review Code  1- Verbalizes Understanding      Cardiac Medications II: -Group verbal and written instruction to review commonly prescribed medications for heart disease. Reviews the medication, class of the drug, and side effects. (all other drug classes)   Cardiac Rehab from 09/19/2017 in [facility] Cardiac and Pulmonary Rehab  Date  09/19/17 [facility] Factors]  Educator  [facility]  Instruction Review Code  1- Verbalizes Understanding       Go Sex-Intimacy & Heart Disease, Get SMART - Goal Setting: - Group verbal and written instruction through game format to discuss heart disease and the return to sexual intimacy. Provides group verbal and written material to discuss and apply goal setting through the application of the S.M.A.R.T. Method.   Cardiac Rehab from 09/19/2017 in [facility] Cardiac and Pulmonary Rehab  Date  07/30/17  Educator  SB  Instruction Review Code  1- Verbalizes Understanding      Other Matters of the Heart: - Provides group verbal, written materials and models to describe Heart Failure, Angina, Valve Disease, Peripheral Artery Disease, and Diabetes in the realm of heart disease. Includes description of the disease process and treatment options available to the cardiac patient.   Cardiac Rehab from 09/19/2017 in [facility] Cardiac and Pulmonary Rehab  Date  07/25/17  Educator  KS  Instruction Review Code  1- Verbalizes Understanding      Exercise & Equipment Safety: - Individual verbal instruction and demonstration of equipment use and safety with use of the equipment.   Cardiac Rehab from 09/19/2017 in [facility] Cardiac and Pulmonary Rehab  Date  07/18/17  Educator  [facility]  Instruction Review Code  1- Verbalizes Understanding       Infection Prevention: - Provides verbal and written material to individual with discussion of infection control including proper hand washing and proper equipment cleaning during exercise session.   Cardiac Rehab from 09/19/2017 in [facility] Cardiac and Pulmonary Rehab  Date  07/18/17  Educator  [facility]  Instruction Review Code  1- Verbalizes Understanding      Falls Prevention: - Provides verbal and written material to individual with discussion of falls prevention and safety.   Cardiac Rehab from 09/19/2017 in [facility] Cardiac and Pulmonary Rehab  Date  07/18/17  Educator  [facility]  Instruction Review Code  1- Verbalizes Understanding      Diabetes: - Individual verbal and written instruction to review signs/symptoms of diabetes, desired ranges of glucose level fasting, after meals and with exercise. Acknowledge  that pre and post exercise glucose checks will be done for 3 sessions at entry of program.   Cardiac Rehab from 09/19/2017 in [facility] Cardiac and Pulmonary Rehab  Date  07/18/17  Educator  Sb  Instruction Review Code  1- Verbalizes Understanding      Other: -Provides group and verbal instruction on various topics (see comments)    Knowledge Questionnaire Score: Knowledge Questionnaire Score - 09/19/17 1101      Knowledge Questionnaire Score   Pre Score  24/28    Post Score  27/28 reviewed results with pt today       Core Components/Risk Factors/Patient Goals at Admission: Personal Goals and Risk Factors at Admission - 07/18/17 1341      Core Components/Risk Factors/Patient Goals on Admission    Weight Management  Yes;Obesity;Weight Loss    Intervention  Weight Management: Develop a combined nutrition and exercise program designed to reach desired caloric intake, while maintaining appropriate intake of nutrient and fiber, sodium and fats, and appropriate energy expenditure required for the weight goal.;Weight Management/Obesity: Establish reasonable short term and long term weight  goals.;Obesity: Provide education and appropriate resources to help participant work on and attain dietary goals.    Admit Weight  218 lb (98.9 kg)    Goal Weight: Short Term  215 lb (97.5 kg)    Goal Weight: Long Term  150 lb (68 kg)    Expected Outcomes  Short Term: Continue to assess and modify interventions until short term weight is achieved;Long Term: Adherence to nutrition and physical activity/exercise program aimed toward attainment of established weight goal;Weight Loss: Understanding of general recommendations for a balanced deficit meal plan, which promotes 1-2 lb weight loss per week and includes a negative energy balance of [phone] kcal/d    Diabetes  Yes    Intervention  Provide education about signs/symptoms and action to take for hypo/hyperglycemia.;Provide education about proper nutrition, including hydration, and aerobic/resistive exercise prescription along with prescribed medications to achieve blood glucose in normal ranges: Fasting glucose 65-99 mg/dL    Expected Outcomes  Short Term: Participant verbalizes understanding of the signs/symptoms and immediate care of hyper/hypoglycemia, proper foot care and importance of medication, aerobic/resistive exercise and nutrition plan for blood glucose control.;Long Term: Attainment of HbA1C < 7%.    Hypertension  Yes    Intervention  Provide education on lifestyle modifcations including regular physical activity/exercise, weight management, moderate sodium restriction and increased consumption of fresh fruit, vegetables, and low fat dairy, alcohol moderation, and smoking cessation.;Monitor prescription use compliance.    Expected Outcomes  Short Term: Continued assessment and intervention until BP is < 140/38m HG in hypertensive participants. < 130/850mHG in hypertensive participants with diabetes, heart failure or chronic kidney disease.;Long Term: Maintenance of blood pressure at goal levels.    Lipids  Yes    Intervention  Provide  education and support for participant on nutrition & aerobic/resistive exercise along with prescribed medications to achieve LDL <7026mHDL >87m84m  Expected Outcomes  Short Term: Participant states understanding of desired cholesterol values and is compliant with medications prescribed. Participant is following exercise prescription and nutrition guidelines.;Long Term: Cholesterol controlled with medications as prescribed, with individualized exercise RX and with personalized nutrition plan. Value goals: LDL < 70mg1mL > 40 mg.    Stress  Yes    Intervention  Offer individual and/or small group education and counseling on adjustment to heart disease, stress management and health-related lifestyle change. Teach and support self-help strategies.;Refer participants experiencing  significant psychosocial distress to appropriate mental health specialists for further evaluation and treatment. When possible, include family members and significant others in education/counseling sessions.    Expected Outcomes  Short Term: Participant demonstrates changes in health-related behavior, relaxation and other stress management skills, ability to obtain effective social support, and compliance with psychotropic medications if prescribed.;Long Term: Emotional wellbeing is indicated by absence of clinically significant psychosocial distress or social isolation.       Core Components/Risk Factors/Patient Goals Review:  Goals and Risk Factor Review    Row Name 07/30/17 0905 07/30/17 1113 08/22/17 1010         Core Components/Risk Factors/Patient Goals Review   Personal Goals Review  -  Weight Management/Obesity;Hypertension;Lipids;Diabetes  Weight Management/Obesity;Hypertension     Review  -  Tammy Hatfield has been doing well in rehab.  Her weight was up today, but she has been stress eating.  Her blood pressures and blood sugars have been good.  She checks them at home routinely and tracks them for her doctor.  She feels her  medications are working for her for the most part. She has a lot of stress and anxiety which tirggers chest pain.  She is going to talk with Juliann Pulse about this too.  Tammy Hatfield saw Dr Humphrey Rolls and had an angiogram which she stated showed her heart is ok.  The PA cleared her to return to exercise.  She is still having some of the chest and neck burning so has met with Gertie Fey Doc.  She is having enzymes checked and is asking Dr for a referral to a vascular Dr.  She was advised to keep her exercise very lightly on her own.      Expected Outcomes  -  Short: Continue to work on weight loss and meet to talk about stress and aniexty.  Long: Continue to monitor and work on risk factor modifications.   Short - Tammy Hatfield will follow up with Dr recommendations.  Long - Anns Dr will help he resolve the issues so she can exercise without worry.          Core Components/Risk Factors/Patient Goals at Discharge (Final Review):  Goals and Risk Factor Review - 08/22/17 1010      Core Components/Risk Factors/Patient Goals Review   Personal Goals Review  Weight Management/Obesity;Hypertension    Review  Tammy Hatfield saw Dr Humphrey Rolls and had an angiogram which she stated showed her heart is ok.  The PA cleared her to return to exercise.  She is still having some of the chest and neck burning so has met with Gertie Fey Doc.  She is having enzymes checked and is asking Dr for a referral to a vascular Dr.  She was advised to keep her exercise very lightly on her own.     Expected Outcomes  Short - Tammy Hatfield will follow up with Dr recommendations.  Long - Anns Dr will help he resolve the issues so she can exercise without worry.         ITP Comments: ITP Comments    Row Name 07/18/17 1325 08/08/17 0923 08/14/17 0606 09/11/17 0655 09/19/17 1112   ITP Comments  medical review completed.  Initial ITP sent to Dr Elta Guadeloupe Sabra Heck for review, necessary changes and cosign.  Diagnosis documentation can be found in [facility]10/26/2018  Tammy Hatfield mentioned that she has had some "burning" in  chest and neck similar to symptoms before her event.  She slowed down and the sensation went away.  She was advised to call her cardiologist  today.  She saw him Monday but they did not discuss this symptom.  30 day review. Continue with ITP unless directed changes per Medical Director review.   30 day review. Continue with ITP unless directed changes per Medical Director review.   Tammy "Tammy Hatfield" reported 8/10 jaw pain while exercising approx 10 minutes on the treadmill at only 2.50mh. With rest after several minutes her jaw and chest pain went away. "ALelon Hatfield is scheduled to see Dr. KHumphrey Rollstoday and she will tell him. Also faxed to his office information about this.       Comments:

## 2017-09-24 ENCOUNTER — Ambulatory Visit
Admission: RE | Admit: 2017-09-24 | Discharge: 2017-09-24 | Disposition: A | Discharge status: Home / Self Care | Payer: Medicare HMO | Source: Ambulatory Visit | Attending: Physician Assistant | Admitting: Physician Assistant

## 2017-09-24 ENCOUNTER — Ambulatory Visit: Payer: Medicare HMO

## 2017-09-24 ENCOUNTER — Other Ambulatory Visit: Payer: Self-pay | Admitting: Physician Assistant

## 2017-09-24 DIAGNOSIS — Z955 Presence of coronary angioplasty implant and graft: Secondary | ICD-10-CM

## 2017-09-24 DIAGNOSIS — R600 Localized edema: Secondary | ICD-10-CM

## 2017-09-24 NOTE — Progress Notes (Signed)
Daily Session Note  Patient Details  Name: Tammy Hatfield MRN: [id number] Date of Birth: February 20, 1946 Referring Provider:     Cardiac Rehab from 07/18/2017 in [facility] Cardiac and Pulmonary Rehab  Referring Provider  khan      Encounter Date: 09/24/2017  Check In: Session Check In - 09/24/17 0844      Check-In   Location  [facility]-Cardiac & Pulmonary Rehab    Staff Present  Alberteen Sam, MA, RCEP, CCRP, Exercise Physiologist;Myeasha Ballowe Oletta Darter, BA, ACSM CEP, Exercise Physiologist;Susanne Bice, RN, BSN, CCRP    Supervising physician immediately available to respond to emergencies  See telemetry face sheet for immediately available ER MD    Medication changes reported      No    Fall or balance concerns reported     No    Warm-up and Cool-down  Performed on first and last piece of equipment    Resistance Training Performed  Yes    VAD Patient?  No      Pain Assessment   Currently in Pain?  No/denies    Multiple Pain Sites  No          Social History   Tobacco Use  Smoking Status Former Smoker  . Packs/day: 2.00  . Years: 30.00  . Pack years: 60.00  . Last attempt to quit: 02/01/2000  . Years since quitting: 17.6  Smokeless Tobacco Never Used  Tobacco Comment   07/18/2017 former smoker quit 1999    Goals Met:  Independence with exercise equipment Exercise tolerated well Personal goals reviewed  Goals Unmet:  Not Applicable  Comments: Tammy Hatfield noticed her weight is up and L leg is swollen  - she was instructed and agreed to call her Dr today.   Dr. Emily Hatfield is Medical Director for [place] and [facility].

## 2017-09-26 DIAGNOSIS — Z955 Presence of coronary angioplasty implant and graft: Secondary | ICD-10-CM

## 2017-09-26 NOTE — Progress Notes (Signed)
Daily Session Note  Patient Details  Name: Tammy Hatfield MRN: [id number] Date of Birth: 09/14/1945 Referring Provider:     Cardiac Rehab from 07/18/2017 in [facility] Cardiac and Pulmonary Rehab  Referring Provider  khan      Encounter Date: 09/26/2017  Check In: Session Check In - 09/26/17 0831      Check-In   Staff Present  Jessica Hawkins, MA, RCEP, CCRP, Exercise Physiologist;Amanda Sommer, BA, ACSM CEP, Exercise Physiologist;Carroll Enterkin, RN, BSN    Supervising physician immediately available to respond to emergencies  See telemetry face sheet for immediately available ER MD    Medication changes reported      No    Fall or balance concerns reported     No    Warm-up and Cool-down  Performed on first and last piece of equipment    Resistance Training Performed  Yes    VAD Patient?  No      Pain Assessment   Currently in Pain?  No/denies    Multiple Pain Sites  No          Social History   Tobacco Use  Smoking Status Former Smoker  . Packs/day: 2.00  . Years: 30.00  . Pack years: 60.00  . Last attempt to quit: 02/01/2000  . Years since quitting: 17.6  Smokeless Tobacco Never Used  Tobacco Comment   07/18/2017 former smoker quit 1999    Goals Met:  Independence with exercise equipment Exercise tolerated well No report of cardiac concerns or symptoms Strength training completed today  Goals Unmet:  Not Applicable  Comments:  6 Minute Walk    Row Name 07/18/17 1429 09/26/17 0942       6 Minute Walk   Phase  -  Discharge    Distance  912 feet  1050 feet    Distance % Change  -  15 %    Distance Feet Change  -  138 ft    Walk Time  6 minutes  6 minutes    # of Rest Breaks  0  0    MPH  1.72  1.98    METS  1.8  1.9    RPE  14  15    Perceived Dyspnea   3  4    VO2 Peak  6.3  6.72    Symptoms  No  Yes (comment)    Comments  -  shortness of breath 4 leg fatigue - general fatigue    Resting HR  68 bpm  68 bpm    Resting BP  118/62  110/58    Resting Oxygen Saturation   95 %  -    Exercise Oxygen Saturation  during 6 min walk  95 %  98 %    Max Ex. HR  100 bpm  107 bpm    Max Ex. BP  156/66  130/54    2 Minute Post BP  130/64  -         Dr. Mark Miller is Medical Director for [facility] and [facility]. 

## 2017-10-01 ENCOUNTER — Encounter: Payer: Medicare HMO | Admitting: *Deleted

## 2017-10-01 DIAGNOSIS — Z955 Presence of coronary angioplasty implant and graft: Secondary | ICD-10-CM | POA: Diagnosis not present

## 2017-10-01 NOTE — Progress Notes (Signed)
Daily Session Note  Patient Details  Name: ARMINTA GAMM MRN: [id number] Date of Birth: 1946/08/05 Referring Provider:     Cardiac Rehab from 07/18/2017 in [facility] Cardiac and Pulmonary Rehab  Referring Provider  khan      Encounter Date: 10/01/2017  Check In: Session Check In - 10/01/17 0909      Check-In   Location  [facility]-Cardiac & Pulmonary Rehab    Staff Present  Alberteen Sam, MA, RCEP, CCRP, Exercise Physiologist;Amanda Oletta Darter, BA, ACSM CEP, Exercise Physiologist;Susanne Bice, RN, BSN, CCRP    Supervising physician immediately available to respond to emergencies  See telemetry face sheet for immediately available ER MD    Medication changes reported      No    Fall or balance concerns reported     No    Warm-up and Cool-down  Performed on first and last piece of equipment    Resistance Training Performed  Yes    VAD Patient?  No      Pain Assessment   Currently in Pain?  No/denies        Exercise Prescription Changes - 09/30/17 1600      Response to Exercise   Blood Pressure (Admit)  110/58    Blood Pressure (Exercise)  130/54    Blood Pressure (Exit)  112/54    Heart Rate (Admit)  69 bpm    Heart Rate (Exercise)  107 bpm    Heart Rate (Exit)  84 bpm    Rating of Perceived Exertion (Exercise)  15    Symptoms  none    Duration  Continue with 30 min of aerobic exercise without signs/symptoms of physical distress.    Intensity  THRR unchanged      Progression   Progression  Continue to progress workloads to maintain intensity without signs/symptoms of physical distress.    Average METs  2.12      Resistance Training   Training Prescription  Yes    Weight  2 lbs    Reps  10-15      Interval Training   Interval Training  No      Treadmill   MPH  1.5    Grade  0    Minutes  15    METs  2.15      NuStep   Level  3    Minutes  15    METs  2.6      REL-XR   Level  4    Minutes  15    METs  2.2      Home Exercise Plan   Plans to continue exercise  at  Home (comment) walking and gym in complex    Frequency  Add 2 additional days to program exercise sessions.    Initial Home Exercises Provided  08/13/17       Social History   Tobacco Use  Smoking Status Former Smoker  . Packs/day: 2.00  . Years: 30.00  . Pack years: 60.00  . Last attempt to quit: 02/01/2000  . Years since quitting: 17.6  Smokeless Tobacco Never Used  Tobacco Comment   07/18/2017 former smoker quit 1999    Goals Met:  Independence with exercise equipment Exercise tolerated well No report of cardiac concerns or symptoms Strength training completed today  Goals Unmet:  Not Applicable  Comments: Pt able to follow exercise prescription today without complaint.  Will continue to monitor for progression.    Dr. Emily Filbert is Medical Director for [facility] and [facility].

## 2017-10-03 ENCOUNTER — Encounter: Payer: Medicare HMO | Admitting: *Deleted

## 2017-10-03 DIAGNOSIS — Z955 Presence of coronary angioplasty implant and graft: Secondary | ICD-10-CM

## 2017-10-03 NOTE — Progress Notes (Signed)
Daily Session Note  Patient Details  Name: Tammy Hatfield MRN: [id number] Date of Birth: November 25, 1945 Referring Provider:     Cardiac Rehab from 07/18/2017 in [facility] Cardiac and Pul[facility] Rehab  Referring Provider  khan      Encounter Date: 10/03/2017  Check In: Session Check In - 10/03/17 1033      Check-In   Location  [facility]-Cardiac & Pulmonary Rehab    Staff Present  Alberteen Sam, MA, RCEP, CCRP, Exercise Physiologist;Amanda Oletta Darter, BA, ACSM CEP, Exercise Physiologist;Carroll Enterkin, RN, BSN    Supervising physician immediately available to respond to emergencies  See telemetry face sheet for immediately available ER MD    Medication changes reported      No    Fall or balance concerns reported     No    Warm-up and Cool-down  Performed on first and last piece of equipment    Resistance Training Performed  Yes    VAD Patient?  No      Pain Assessment   Currently in Pain?  No/denies          Social History   Tobacco Use  Smoking Status Former Smoker  . Packs/day: 2.00  . Years: 30.00  . Pack years: 60.00  . Last attempt to quit: 02/01/2000  . Years since quitting: 17.6  Smokeless Tobacco Never Used  Tobacco Comment   07/18/2017 former smoker quit 1999    Goals Met:  Independence with exercise equipment Exercise tolerated well No report of cardiac concerns or symptoms Strength training completed today  Goals Unmet:  Not Applicable  Comments: Pt able to follow exercise prescription today without complaint.  Will continue to monitor for progression.    Dr. Emily Filbert is Medical Director for [place] and [facility].

## 2017-10-03 NOTE — Patient Instructions (Signed)
Discharge Patient Instructions  Patient Details  Name: Tammy Hatfield MRN: [id number] Date of Birth: [birth year] Referring Provider:  [name], MD   Number of Visits: 36/36  Reason for Discharge:  Patient reached a stable level of exercise. Patient independent in their exercise. Patient has met program and personal goals.  Smoking History:  Social History   Tobacco Use  Smoking Status Former Smoker  . Packs/day: 2.00  . Years: 30.00  . Pack years: 60.00  . Last attempt to quit: 02/01/2000  . Years since quitting: 17.6  Smokeless Tobacco Never Used  Tobacco Comment   07/18/2017 former smoker quit 1999    Diagnosis:  Status post coronary artery stent placement  Initial Exercise Prescription: Initial Exercise Prescription - 07/18/17 1400      Date of Initial Exercise RX and Referring Provider   Date  07/18/17    Referring Provider  khan      Treadmill   MPH  1.5    Grade  0    Minutes  15    METs  1.8      Recumbant Bike   Level  1    RPM  60    Watts  5    Minutes  15    METs  1.8      NuStep   Level  2    SPM  80    Minutes  15    METs  1.8      REL-XR   Level  1    Speed  50    Minutes  15    METs  1.8      Prescription Details   Frequency (times per week)  3    Duration  Progress to 45 minutes of aerobic exercise without signs/symptoms of physical distress      Intensity   THRR 40-80% of Max Heartrate  100-132    Ratings of Perceived Exertion  11-13    Perceived Dyspnea  0-4      Resistance Training   Training Prescription  Yes    Weight  2 lbs    Reps  10-15       Discharge Exercise Prescription (Final Exercise Prescription Changes): Exercise Prescription Changes - 09/30/17 1600      Response to Exercise   Blood Pressure (Admit)  110/58    Blood Pressure (Exercise)  130/54    Blood Pressure (Exit)  112/54    Heart Rate (Admit)  69 bpm    Heart Rate (Exercise)  107 bpm    Heart Rate (Exit)  84 bpm    Rating of  Perceived Exertion (Exercise)  15    Symptoms  none    Duration  Continue with 30 min of aerobic exercise without signs/symptoms of physical distress.    Intensity  THRR unchanged      Progression   Progression  Continue to progress workloads to maintain intensity without signs/symptoms of physical distress.    Average METs  2.12      Resistance Training   Training Prescription  Yes    Weight  2 lbs    Reps  10-15      Interval Training   Interval Training  No      Treadmill   MPH  1.5    Grade  0    Minutes  15    METs  2.15      NuStep   Level  3    Minutes  15  METs  2.6      REL-XR   Level  4    Minutes  15    METs  2.2      Home Exercise Plan   Plans to continue exercise at  Home (comment) walking and gym in complex    Frequency  Add 2 additional days to program exercise sessions.    Initial Home Exercises Provided  08/13/17       Functional Capacity: 6 Minute Walk    Row Name 07/18/17 1429 09/26/17 0942       6 Minute Walk   Phase  -  Discharge    Distance  912 feet  1050 feet    Distance % Change  -  15 %    Distance Feet Change  -  138 ft    Walk Time  6 minutes  6 minutes    # of Rest Breaks  0  0    MPH  1.72  1.98    METS  1.8  1.9    RPE  14  15    Perceived Dyspnea   3  4    VO2 Peak  6.3  6.72    Symptoms  No  Yes (comment)    Comments  -  shortness of breath 4 leg fatigue - general fatigue    Resting HR  68 bpm  68 bpm    Resting BP  118/62  110/58    Resting Oxygen Saturation   95 %  -    Exercise Oxygen Saturation  during 6 min walk  95 %  98 %    Max Ex. HR  100 bpm  107 bpm    Max Ex. BP  156/66  130/54    2 Minute Post BP  130/64  -       Quality of Life: Quality of Life - 09/19/17 1101      Quality of Life Scores   Health/Function Pre  10.18 %    Health/Function Post  17.07 %    Health/Function % Change  67.68 %    Socioeconomic Pre  16.25 %    Socioeconomic Post  18 %    Socioeconomic % Change   10.77 %     Psych/Spiritual Pre  10.1 %    Psych/Spiritual Post  24.43 %    Psych/Spiritual % Change  141.88 %    Family Pre  15 %    Family Post  12.75 %    Family % Change  -15 %    GLOBAL Pre  11.78 %    GLOBAL Post  18.37 %    GLOBAL % Change  55.94 %       Personal Goals: Goals established at orientation with interventions provided to work toward goal. Personal Goals and Risk Factors at Admission - 07/18/17 1341      Core Components/Risk Factors/Patient Goals on Admission    Weight Management  Yes;Obesity;Weight Loss    Intervention  Weight Management: Develop a combined nutrition and exercise program designed to reach desired caloric intake, while maintaining appropriate intake of nutrient and fiber, sodium and fats, and appropriate energy expenditure required for the weight goal.;Weight Management/Obesity: Establish reasonable short term and long term weight goals.;Obesity: Provide education and appropriate resources to help participant work on and attain dietary goals.    Admit Weight  218 lb (98.9 kg)    Goal Weight: Short Term  215 lb (97.5 kg)    Goal Weight:  Long Term  150 lb (68 kg)    Expected Outcomes  Short Term: Continue to assess and modify interventions until short term weight is achieved;Long Term: Adherence to nutrition and physical activity/exercise program aimed toward attainment of established weight goal;Weight Loss: Understanding of general recommendations for a balanced deficit meal plan, which promotes 1-2 lb weight loss per week and includes a negative energy balance of [phone] kcal/d    Diabetes  Yes    Intervention  Provide education about signs/symptoms and action to take for hypo/hyperglycemia.;Provide education about proper nutrition, including hydration, and aerobic/resistive exercise prescription along with prescribed medications to achieve blood glucose in normal ranges: Fasting glucose 65-99 mg/dL    Expected Outcomes  Short Term: Participant verbalizes  understanding of the signs/symptoms and immediate care of hyper/hypoglycemia, proper foot care and importance of medication, aerobic/resistive exercise and nutrition plan for blood glucose control.;Long Term: Attainment of HbA1C < 7%.    Hypertension  Yes    Intervention  Provide education on lifestyle modifcations including regular physical activity/exercise, weight management, moderate sodium restriction and increased consumption of fresh fruit, vegetables, and low fat dairy, alcohol moderation, and smoking cessation.;Monitor prescription use compliance.    Expected Outcomes  Short Term: Continued assessment and intervention until BP is < 140/71m HG in hypertensive participants. < 130/820mHG in hypertensive participants with diabetes, heart failure or chronic kidney disease.;Long Term: Maintenance of blood pressure at goal levels.    Lipids  Yes    Intervention  Provide education and support for participant on nutrition & aerobic/resistive exercise along with prescribed medications to achieve LDL <7093mHDL >28m79m  Expected Outcomes  Short Term: Participant states understanding of desired cholesterol values and is compliant with medications prescribed. Participant is following exercise prescription and nutrition guidelines.;Long Term: Cholesterol controlled with medications as prescribed, with individualized exercise RX and with personalized nutrition plan. Value goals: LDL < 70mg10mL > 40 mg.    Stress  Yes    Intervention  Offer individual and/or small group education and counseling on adjustment to heart disease, stress management and health-related lifestyle change. Teach and support self-help strategies.;Refer participants experiencing significant psychosocial distress to appropriate mental health specialists for further evaluation and treatment. When possible, include family members and significant others in education/counseling sessions.    Expected Outcomes  Short Term: Participant  demonstrates changes in health-related behavior, relaxation and other stress management skills, ability to obtain effective social support, and compliance with psychotropic medications if prescribed.;Long Term: Emotional wellbeing is indicated by absence of clinically significant psychosocial distress or social isolation.        Personal Goals Discharge: Goals and Risk Factor Review - 09/24/17 0949      Core Components/Risk Factors/Patient Goals Review   Personal Goals Review  Other;Diabetes;Hypertension;Heart Failure;Weight Management/Obesity    Review  Anns weight is up today - she knows to take extra Lasix as instructed by Dr.  PlansAleene Davidsonet another scale for home use.  L leg is more swollen [name] wLelon Frohlichinstructed and agreed to call her Dr today about this. BG has been between 150-200    Expected Outcomes  Short - [name] wLelon Frohlich follow up with Dr recommendations.  Long - Anns Dr will help he resolve the issues so she can exercise without worry.         Exercise Goals and Review: Exercise Goals    Row Name 07/18/17 1427             Exercise Goals  Intervention  Provide advice, education, support and counseling about physical activity/exercise needs.;Develop an individualized exercise prescription for aerobic and resistive training based on initial evaluation findings, risk stratification, comorbidities and participant's personal goals.       Expected Outcomes  Achievement of increased cardiorespiratory fitness and enhanced flexibility, muscular endurance and strength shown through measurements of functional capacity and personal statement of participant.       Increase Strength and Stamina  Yes       Intervention  Provide advice, education, support and counseling about physical activity/exercise needs.;Develop an individualized exercise prescription for aerobic and resistive training based on initial evaluation findings, risk stratification, comorbidities and participant's personal goals.        Expected Outcomes  Achievement of increased cardiorespiratory fitness and enhanced flexibility, muscular endurance and strength shown through measurements of functional capacity and personal statement of participant.       Able to understand and use rate of perceived exertion (RPE) scale  Yes       Intervention  Provide education and explanation on how to use RPE scale       Expected Outcomes  Short Term: Able to use RPE daily in rehab to express subjective intensity level;Long Term:  Able to use RPE to guide intensity level when exercising independently       Able to understand and use Dyspnea scale  Yes       Intervention  Provide education and explanation on how to use Dyspnea scale       Expected Outcomes  Short Term: Able to use Dyspnea scale daily in rehab to express subjective sense of shortness of breath during exertion;Long Term: Able to use Dyspnea scale to guide intensity level when exercising independently       Knowledge and understanding of Target Heart Rate Range (THRR)  Yes       Intervention  Provide education and explanation of THRR including how the numbers were predicted and where they are located for reference       Expected Outcomes  Short Term: Able to state/look up THRR;Long Term: Able to use THRR to govern intensity when exercising independently;Short Term: Able to use daily as guideline for intensity in rehab       Able to check pulse independently  Yes       Intervention  Provide education and demonstration on how to check pulse in carotid and radial arteries.;Review the importance of being able to check your own pulse for safety during independent exercise       Expected Outcomes  Short Term: Able to explain why pulse checking is important during independent exercise;Long Term: Able to check pulse independently and accurately       Understanding of Exercise Prescription  Yes       Intervention  Provide education, explanation, and written materials on patient's individual  exercise prescription       Expected Outcomes  Short Term: Able to explain program exercise prescription;Long Term: Able to explain home exercise prescription to exercise independently          Nutrition & Weight - Outcomes: Pre Biometrics - 07/18/17 1427      Pre Biometrics   Height  5' 4.5" (1.638 m)    Weight  218 lb (98.9 kg)    Waist Circumference  40 inches    Hip Circumference  49.75 inches    Waist to Hip Ratio  0.8 %    BMI (Calculated)  36.86    Single Leg Stand  5.31 seconds        Nutrition: Nutrition Therapy & Goals - 09/26/17 0958      Nutrition Therapy   Diet  diabetic / heart healthy    Protein (specify units)  10-11oz    Fiber  25 grams    Saturated Fats  13 max. grams    Fruits and Vegetables  6 servings/day    Sodium  1500 grams      Personal Nutrition Goals   Nutrition Goal  Practice portioning out carbohyrate servings using guidelines provided    Personal Goal #2  Aim to eat about the same amount of carbohydrates at each meal (45g) and snack (15-30g) and eat on a consistent schedule    Personal Goal #3  Continue to choose foods that are low in sodium, added sugars and total fat      Intervention Plan   Intervention  Nutrition handout(s) given to patient.;Prescribe, educate and counsel regarding individualized specific dietary modifications aiming towards targeted core components such as weight, hypertension, lipid management, diabetes, heart failure and other comorbidities.    Expected Outcomes  Short Term Goal: Understand basic principles of dietary content, such as calories, fat, sodium, cholesterol and nutrients.;Short Term Goal: A plan has been developed with personal nutrition goals set during dietitian appointment.;Long Term Goal: Adherence to prescribed nutrition plan.       Nutrition Discharge: Nutrition Assessments - 09/19/17 1101      MEDFICTS Scores   Pre Score  89    Post Score  83    Score Difference  -6       Education  Questionnaire Score: Knowledge Questionnaire Score - 09/19/17 1101      Knowledge Questionnaire Score   Pre Score  24/28    Post Score  27/28 reviewed results with pt today       Goals reviewed with patient; copy given to patient.

## 2017-10-08 ENCOUNTER — Encounter: Payer: Medicare HMO | Admitting: *Deleted

## 2017-10-08 DIAGNOSIS — Z955 Presence of coronary angioplasty implant and graft: Secondary | ICD-10-CM

## 2017-10-08 NOTE — Progress Notes (Signed)
Cardiac Individual Treatment Plan  Patient Details  Name: Tammy Hatfield MRN: [id number] Date of Birth: [birth year] Referring Provider:     Cardiac Rehab from 07/18/2017 in [facility] Cardiac and Pulmonary Rehab  Referring Provider  khan      Initial Encounter Date:    Cardiac Rehab from 07/18/2017 in [facility] Cardiac and Pulmonary Rehab  Date  07/18/17  Referring Provider  khan      Visit Diagnosis: Status post coronary artery stent placement  Patient's Home Medications on Admission:  Current Outpatient Medications:  .  albuterol (PROVENTIL HFA;VENTOLIN HFA) 108 (90 BASE) MCG/ACT inhaler, Inhale 2 puffs into the lungs every 4 (four) hours as needed for wheezing or shortness of breath., Disp: , Rfl:  .  ALPRAZolam (XANAX) 0.5 MG tablet, Take 0.5 mg by mouth 2 (two) times daily as needed for anxiety., Disp: , Rfl:  .  aspirin 81 MG chewable tablet, Chew 1 tablet (81 mg total) by mouth daily., Disp: , Rfl:  .  glipiZIDE (GLUCOTROL) 10 MG tablet, Take 10 mg by mouth daily before breakfast., Disp: , Rfl:  .  insulin NPH-regular Human (NOVOLIN 70/30) (70-30) 100 UNIT/ML injection, Inject 15-25 Units into the skin 2 (two) times daily with a meal. , Disp: , Rfl:  .  isosorbide mononitrate (IMDUR) 30 MG 24 hr tablet, Take 1 tablet (30 mg total) by mouth daily., Disp: 30 tablet, Rfl: 0 .  loratadine (CLARITIN) 10 MG tablet, Take 10 mg by mouth daily as needed. , Disp: , Rfl:  .  losartan (COZAAR) 50 MG tablet, Take 50 mg by mouth every morning. , Disp: , Rfl:  .  metoprolol succinate (TOPROL-XL) 25 MG 24 hr tablet, , Disp: , Rfl:  .  metoprolol tartrate (LOPRESSOR) 25 MG tablet, Take 1 tablet (25 mg total) by mouth 2 (two) times daily., Disp: 60 tablet, Rfl: 0 .  nitroGLYCERIN (NITROSTAT) 0.4 MG SL tablet, Place 1 tablet (0.4 mg total) under the tongue every 5 (five) minutes as needed for chest pain., Disp: 30 tablet, Rfl: 0 .  omeprazole (PRILOSEC) 20 MG capsule, Take 20 mg by mouth 2 (two) times  daily. , Disp: , Rfl:  .  PARoxetine (PAXIL) 40 MG tablet, Take 20 mg by mouth 2 (two) times daily. , Disp: , Rfl:  .  ticagrelor (BRILINTA) 90 MG TABS tablet, Take 1 tablet (90 mg total) by mouth 2 (two) times daily., Disp: 60 tablet, Rfl: 0 .  traMADol (ULTRAM) 50 MG tablet, Take 50 mg by mouth every 6 (six) hours as needed for moderate pain. , Disp: , Rfl:  .  valACYclovir (VALTREX) 500 MG tablet, TAKE ONE CAPLET BY MOUTH TWICE DAILY FOR 3 DAYS AS NEEDED FOR  FLARE, Disp: , Rfl:   Past Medical History: Past Medical History:  Diagnosis Date  . Arthritis    knees  . Barrett's esophagus   . Breast cancer ([place]) 2014   RT LUMPECTOMY  . Cancer [facility]) 2014   right breast - s\p Radiation and lumpectomy and LN dissection  . Chronic sinusitis   . COPD (chronic obstructive pulmonary disease) ([place]) 2012  . Degenerative lumbar disc   . Diabetes mellitus without complication ([place])   . Dysphagia   . Esophageal reflux   . History of Helicobacter pylori infection   . Hypercholesteremia   . Hyperlipidemia   . Hypertension   . Personal history of radiation therapy   . Radiation 2014   RT BREAST CA  . Ulcer, stomach peptic  history of    Tobacco Use: Social History   Tobacco Use  Smoking Status Former Smoker  . Packs/day: 2.00  . Years: 30.00  . Pack years: 60.00  . Last attempt to quit: 02/01/2000  . Years since quitting: 17.6  Smokeless Tobacco Never Used  Tobacco Comment   07/18/2017 former smoker quit 1999    Labs: Recent Review Flowsheet Data    Labs for ITP Cardiac and Pulmonary Rehab Latest Ref Rng & Units 02/11/2015 07/05/2017   Cholestrol 0 - 200 mg/dL - 255(H)   LDLCALC 0 - 99 mg/dL - 147(H)   HDL >40 mg/dL - 62   Trlycerides <150 mg/dL - 231(H)   Hemoglobin A1c 4.0 - 6.0 % 7.4(H) -       Exercise Target Goals:    Exercise Program Goal: Individual exercise prescription set using results from initial 6 min walk test and THRR while considering  patient's activity  barriers and safety.   Exercise Prescription Goal: Initial exercise prescription builds to 30-45 minutes a day of aerobic activity, 2-3 days per week.  Home exercise guidelines will be given to patient during program as part of exercise prescription that the participant will acknowledge.  Activity Barriers & Risk Stratification: Activity Barriers & Cardiac Risk Stratification - 07/18/17 1331      Activity Barriers & Cardiac Risk Stratification   Activity Barriers  Arthritis;Back Problems;Chest Pain/Angina;History of Falls;Joint Problems BOth knees "bone on bone"  needs surgery. Disc disease, bulging disc, arthritis all over.  Angina with exertion, History of falls because of "knees going out"    Cardiac Risk Stratification  High       6 Minute Walk: 6 Minute Walk    Row Name 07/18/17 1429 09/26/17 0942       6 Minute Walk   Phase  -  Discharge    Distance  912 feet  1050 feet    Distance % Change  -  15 %    Distance Feet Change  -  138 ft    Walk Time  6 minutes  6 minutes    # of Rest Breaks  0  0    MPH  1.72  1.98    METS  1.8  1.9    RPE  14  15    Perceived Dyspnea   3  4    VO2 Peak  6.3  6.72    Symptoms  No  Yes (comment)    Comments  -  shortness of breath 4 leg fatigue - general fatigue    Resting HR  68 bpm  68 bpm    Resting BP  118/62  110/58    Resting Oxygen Saturation   95 %  -    Exercise Oxygen Saturation  during 6 min walk  95 %  98 %    Max Ex. HR  100 bpm  107 bpm    Max Ex. BP  156/66  130/54    2 Minute Post BP  130/64  -       Oxygen Initial Assessment:   Oxygen Re-Evaluation:   Oxygen Discharge (Final Oxygen Re-Evaluation):   Initial Exercise Prescription: Initial Exercise Prescription - 07/18/17 1400      Date of Initial Exercise RX and Referring Provider   Date  07/18/17    Referring Provider  khan      Treadmill   MPH  1.5    Grade  0    Minutes  15  METs  1.8      Recumbant Bike   Level  1    RPM  60    Watts  5     Minutes  15    METs  1.8      NuStep   Level  2    SPM  80    Minutes  15    METs  1.8      REL-XR   Level  1    Speed  50    Minutes  15    METs  1.8      Prescription Details   Frequency (times per week)  3    Duration  Progress to 45 minutes of aerobic exercise without signs/symptoms of physical distress      Intensity   THRR 40-80% of Max Heartrate  100-132    Ratings of Perceived Exertion  11-13    Perceived Dyspnea  0-4      Resistance Training   Training Prescription  Yes    Weight  2 lbs    Reps  10-15       Perform Capillary Blood Glucose checks as needed.  Exercise Prescription Changes: Exercise Prescription Changes    Row Name 07/18/17 1300 07/23/17 1500 08/06/17 1400 08/21/17 1400 09/02/17 1300     Response to Exercise   Blood Pressure (Admit)  118/62  110/62  126/74  120/62  134/76   Blood Pressure (Exercise)  156/66  146/72  130/54  124/72  146/64   Blood Pressure (Exit)  130/64  136/76  122/64  134/70  92/52   Heart Rate (Admit)  72 bpm  87 bpm  84 bpm  63 bpm  84 bpm   Heart Rate (Exercise)  100 bpm  105 bpm  99 bpm  97 bpm  112 bpm   Heart Rate (Exit)  71 bpm  79 bpm  82 bpm  74 bpm  81 bpm   Rating of Perceived Exertion (Exercise)  _0 Symptoms  -  none  none  none  none   Comments  -  first full day of exercise  -  -  -   Duration  -  Progress to 45 minutes of aerobic exercise without signs/symptoms of physical distress  Progress to 45 minutes of aerobic exercise without signs/symptoms of physical distress  Continue with 45 min of aerobic exercise without signs/symptoms of physical distress.  Continue with 45 min of aerobic exercise without signs/symptoms of physical distress.   Intensity  -  THRR unchanged  THRR unchanged  THRR unchanged  THRR unchanged     Progression   Progression  -  Continue to progress workloads to maintain intensity without signs/symptoms of physical distress.  Continue to progress workloads to maintain  intensity without signs/symptoms of physical distress.  Continue to progress workloads to maintain intensity without signs/symptoms of physical distress.  Continue to progress workloads to maintain intensity without signs/symptoms of physical distress.   Average METs  -  1.98  2.05  2.05  2.48     Resistance Training   Training Prescription  -  Yes  Yes  Yes  Yes   Weight  -  2 lbs  2 lbs  2 lbs  2 lbs   Reps  -  10-15  10-15  10-15  10-15     Interval Training   Interval Training  -  No  No  No  No     Treadmill   MPH  -  1.5  1.5  1.5  1.5   Grade  -  0  0  0  0   Minutes  -  _0 METs  -  1.8  2.15  2.15  2.15     NuStep   Level  -  -  _1 Minutes  -  -  _2 METs  -  -  1.8  1.8  2.5     REL-XR   Level  -  _3 Minutes  -  _4 METs  -  1.8  2.2  2.2  2.8     Home Exercise Plan   Plans to continue exercise at  -  -  -  Home (comment) walking and gym in complex  Home (comment) walking and gym in complex   Frequency  -  -  -  Add 2 additional days to program exercise sessions.  Add 2 additional days to program exercise sessions.   Initial Home Exercises Provided  -  -  -  08/13/17  08/13/17   Row Name 09/17/17 1700 09/30/17 1600           Response to Exercise   Blood Pressure (Admit)  134/64  110/58      Blood Pressure (Exercise)  144/62  130/54      Blood Pressure (Exit)  112/60  112/54      Heart Rate (Admit)  85 bpm  69 bpm      Heart Rate (Exercise)  98 bpm  107 bpm      Heart Rate (Exit)  77 bpm  84 bpm      Rating of Perceived Exertion (Exercise)  12  15      Symptoms  none  none      Duration  Continue with 30 min of aerobic exercise without signs/symptoms of physical distress.  Continue with 30 min of aerobic exercise without signs/symptoms of physical distress.      Intensity  THRR unchanged  THRR unchanged        Progression   Progression  Continue to progress workloads to maintain intensity without  signs/symptoms of physical distress.  Continue to progress workloads to maintain intensity without signs/symptoms of physical distress.      Average METs  2.88  2.12        Resistance Training   Training Prescription  Yes  Yes      Weight  2 lbs  2 lbs      Reps  10-15  10-15        Interval Training   Interval Training  No  No        Treadmill   MPH  1.5  1.5      Grade  0  0      Minutes  15  15      METs  2.15  2.15        NuStep   Level  3  3      Minutes  15  15      METs  2.6  2.6        REL-XR   Level  4  4      Minutes  15  15  METs  3.9  2.2        Home Exercise Plan   Plans to continue exercise at  Home (comment) walking and gym in complex  Home (comment) walking and gym in complex      Frequency  Add 2 additional days to program exercise sessions.  Add 2 additional days to program exercise sessions.      Initial Home Exercises Provided  08/13/17  08/13/17         Exercise Comments: Exercise Comments    Row Name 07/23/17 6962 08/08/17 0921 08/13/17 1104 09/24/17 1020 10/08/17 0835   Exercise Comments  First full day of exercise!  Patient was oriented to gym and equipment including functions, settings, policies, and procedures.  Patient's individual exercise prescription and treatment plan were reviewed.  All starting workloads were established based on the results of the 6 minute walk test done at initial orientation visit.  The plan for exercise progression was also introduced and progression will be customized based on patient's performance and goals.  [name] mentioned that she has had some "burning" in chest and neck similar to symptoms before her event.  She slowed down and the sensation went away.  She was advised to call her cardiologist today.  She saw him Monday but they did not discuss this symptom.   Reviewed home exercise with pt today.  Pt plans to use facility where she lives for exercise.  Reviewed THR, pulse, RPE, sign and symptoms, NTG use, and when to  call 911 or MD.  Also discussed weather considerations and indoor options.  Pt voiced understanding.  [name] saw her Dr last week after having s/s - will get stress test results today.  Advised her to follow Dr orders about exercise and follow up with staff Thursday.  [name] states she will call her Dr about her leg swelling today.  She will let staff know what she finds out.   [name] graduated today from cardiac rehab with 36 sessions completed.  Details of the patient's exercise prescription and what She needs to do in order to continue the prescription and progress were discussed with patient.  Patient was given a copy of prescription and goals.  Patient verbalized understanding.  [name] plans to continue to exercise by walking at home and using the gym in her complex.      Exercise Goals and Review: Exercise Goals    Row Name 07/18/17 1427             Exercise Goals   Intervention  Provide advice, education, support and counseling about physical activity/exercise needs.;Develop an individualized exercise prescription for aerobic and resistive training based on initial evaluation findings, risk stratification, comorbidities and participant's personal goals.       Expected Outcomes  Achievement of increased cardiorespiratory fitness and enhanced flexibility, muscular endurance and strength shown through measurements of functional capacity and personal statement of participant.       Increase Strength and Stamina  Yes       Intervention  Provide advice, education, support and counseling about physical activity/exercise needs.;Develop an individualized exercise prescription for aerobic and resistive training based on initial evaluation findings, risk stratification, comorbidities and participant's personal goals.       Expected Outcomes  Achievement of increased cardiorespiratory fitness and enhanced flexibility, muscular endurance and strength shown through measurements of functional capacity and personal  statement of participant.       Able to understand and use rate of perceived exertion (RPE) scale  Yes       Intervention  Provide education and explanation on how to use RPE scale       Expected Outcomes  Short Term: Able to use RPE daily in rehab to express subjective intensity level;Long Term:  Able to use RPE to guide intensity level when exercising independently       Able to understand and use Dyspnea scale  Yes       Intervention  Provide education and explanation on how to use Dyspnea scale       Expected Outcomes  Short Term: Able to use Dyspnea scale daily in rehab to express subjective sense of shortness of breath during exertion;Long Term: Able to use Dyspnea scale to guide intensity level when exercising independently       Knowledge and understanding of Target Heart Rate Range (THRR)  Yes       Intervention  Provide education and explanation of THRR including how the numbers were predicted and where they are located for reference       Expected Outcomes  Short Term: Able to state/look up THRR;Long Term: Able to use THRR to govern intensity when exercising independently;Short Term: Able to use daily as guideline for intensity in rehab       Able to check pulse independently  Yes       Intervention  Provide education and demonstration on how to check pulse in carotid and radial arteries.;Review the importance of being able to check your own pulse for safety during independent exercise       Expected Outcomes  Short Term: Able to explain why pulse checking is important during independent exercise;Long Term: Able to check pulse independently and accurately       Understanding of Exercise Prescription  Yes       Intervention  Provide education, explanation, and written materials on patient's individual exercise prescription       Expected Outcomes  Short Term: Able to explain program exercise prescription;Long Term: Able to explain home exercise prescription to exercise independently            Exercise Goals Re-Evaluation : Exercise Goals Re-Evaluation    Row Name 07/23/17 0857 07/30/17 1111 08/06/17 1438 08/13/17 1104 08/21/17 1446     Exercise Goal Re-Evaluation   Exercise Goals Review  Knowledge and understanding of Target Heart Rate Range (THRR);Able to understand and use rate of perceived exertion (RPE) scale;Understanding of Exercise Prescription  Increase Physical Activity;Understanding of Exercise Prescription;Increase Strength and Stamina  Increase Physical Activity;Understanding of Exercise Prescription;Increase Strength and Stamina  Increase Physical Activity;Increase Strength and Stamina  Increase Physical Activity;Increase Strength and Stamina;Understanding of Exercise Prescription   Comments  Reviewed RPE scale, THR and program prescription with pt today.  Pt voiced understanding and was given a copy of goals to take home.  [name] is off to a good start in rehab.  She is not currently doing any exercise at home, but we will add this in soon.  She is excited about coming to class to get help and time to herself. She will benefit from the program in many ways from exercise to support!!  [name] has been doing well in rehab.  She enjoyed her thanksgiving holiday .  She is improved to 2.2 METs on the NuStep.  We will continue to monitor her progress.   Reviewed home exercise with pt today.  Pt plans to use facility where she lives for exercise.  Reviewed THR, pulse, RPE, sign and symptoms, NTG use,  and when to call 911 or MD.  Also discussed weather considerations and indoor options.  Pt voiced understanding.  [name] saw her Dr last week after having s/s - will get stress test results today.  Advised her to follow Dr orders about exercise and follow up with staff Thursday.  [name] has been doing well in rehab.  She has been out with testing at the doctor's office but has been cleared to return.  We will get her back on her workloads and begin to start to push a little more.  We will continue to  monitor her progress.   Expected Outcomes  Short: Use RPE daily to regulate intensity.  Long: Follow program prescription in THR.  Short: Review home exercise guidelines.  Long: Continue to attend classes regularly to work on strength and stamina.   Short: Review home exercise guidelines.  Long: Continue to attend classes regularly to work on strength and stamina.   Short - [name] will add exercise at home pending Dr clearance after stress test.  Long - [name] will exercise independently.  Short:  [name] will add in exercsie home.  Long: Continue to increase strength and stamina.    [place] Name 08/22/17 1007 09/02/17 1305 09/17/17 1704 09/30/17 1656       Exercise Goal Re-Evaluation   Exercise Goals Review  Increase Physical Activity;Increase Strength and Stamina  Increase Physical Activity;Increase Strength and Stamina;Understanding of Exercise Prescription  Increase Physical Activity;Increase Strength and Stamina;Understanding of Exercise Prescription  Increase Physical Activity;Increase Strength and Stamina;Understanding of Exercise Prescription    Comments  [name] hasn't started exercise at home yet due to snow and not being able to get to the exercise room.  [name] has been doing well in rehab.  She is coming consistently and working hard.  We need to encourage her to try to get in at least one extra day a week at home.  She was able to move up to level 4 on the XR.  We will continue to monitor her progression.   [name] has continued to do well in rehab.  She is [facility] to continue to exercise and help her feet feel better.  She was excited to hear about getting her new shoes approved.  We will continue to monitor her progress.   [name] is nearing graduation.  She improved her post 6MWT by 15%!!  She is planning to continue to exercise by going to the gym in her complex.  We will continue to monitor her progress.     Expected Outcomes  Short - [name] will begin exercise when the snow is  cleared.  Long - [name] will exercise on her own.  Short: Exercise at home routinely.  Long: Continue to increase activity levels.   Short: Get new shoes and increase workload on treadmill.   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   Short: Graduation!!  Long: Continue to exercise regularly.       Discharge Exercise Prescription (Final Exercise Prescription Changes): Exercise Prescription Changes - 09/30/17 1600      Response to Exercise   Blood Pressure (Admit)  110/58    Blood Pressure (Exercise)  130/54    Blood Pressure (Exit)  112/54    Heart Rate (Admit)  69 bpm    Heart Rate (Exercise)  107 bpm    Heart Rate (Exit)  84 bpm    Rating of Perceived Exertion (Exercise)  15    Symptoms  none    Duration  Continue with 30 min of aerobic exercise without signs/symptoms of physical distress.    Intensity  THRR unchanged      Progression   Progression  Continue to progress workloads to maintain intensity without signs/symptoms of physical distress.    Average METs  2.12      Resistance Training   Training Prescription  Yes    Weight  2 lbs    Reps  10-15      Interval Training   Interval Training  No      Treadmill   MPH  1.5    Grade  0    Minutes  15    METs  2.15      NuStep   Level  3    Minutes  15    METs  2.6      REL-XR   Level  4    Minutes  15    METs  2.2      Home Exercise Plan   Plans to continue exercise at  Home (comment) walking and gym in complex    Frequency  Add 2 additional days to program exercise sessions.    Initial Home Exercises Provided  08/13/17       Nutrition:  Target Goals: Understanding of nutrition guidelines, daily intake of sodium <1541m, cholesterol <2029m calories 30% from fat and 7% or less from saturated fats, daily to have 5 or more servings of fruits and vegetables.  Biometrics: Pre Biometrics - 07/18/17 1427      Pre Biometrics   Height  5' 4.5" (1.638 m)    Weight  218 lb (98.9 kg)    Waist  Circumference  40 inches    Hip Circumference  49.75 inches    Waist to Hip Ratio  0.8 %    BMI (Calculated)  36.86    Single Leg Stand  5.31 seconds        Nutrition Therapy Plan and Nutrition Goals: Nutrition Therapy & Goals - 09/26/17 0958      Nutrition Therapy   Diet  diabetic / heart healthy    Protein (specify units)  10-11oz    Fiber  25 grams    Saturated Fats  13 max. grams    Fruits and Vegetables  6 servings/day    Sodium  1500 grams      Personal Nutrition Goals   Nutrition Goal  Practice portioning out carbohyrate servings using guidelines provided    Personal Goal #2  Aim to eat about the same amount of carbohydrates at each meal (45g) and snack (15-30g) and eat on a consistent schedule    Personal Goal #3  Continue to choose foods that are low in sodium, added sugars and total fat      Intervention Plan   Intervention  Nutrition handout(s) given to patient.;Prescribe, educate and counsel regarding individualized specific dietary modifications aiming towards targeted core components such as weight, hypertension, lipid management, diabetes, heart failure and other comorbidities.    Expected Outcomes  Short Term Goal: Understand basic principles of dietary content, such as calories, fat, sodium, cholesterol and nutrients.;Short Term Goal: A plan has been developed with personal nutrition goals set during dietitian appointment.;Long Term Goal: Adherence to prescribed nutrition plan.       Nutrition Assessments: Nutrition Assessments - 09/19/17 1101      MEDFICTS Scores   Pre Score  89    Post Score  83    Score Difference  -  6       Nutrition Goals Re-Evaluation: Nutrition Goals Re-Evaluation    [place] Name 07/30/17 1120 08/22/17 1008           Goals   Nutrition Goal  Make nutrition appointment after Holidays  -      Comment  [name] would like to lose weight, but she has a lot going on and needs some help in order to focus on this.  [name] wants to schedule with RD  but has missed phone calls.      Expected Outcome  Short: make appointment.  Long: Follow heart healthy diet  Short - [name] will meet with the RD ASAP.  Long - [name] will follow RD advice.         Nutrition Goals Discharge (Final Nutrition Goals Re-Evaluation): Nutrition Goals Re-Evaluation - 08/22/17 1008      Goals   Comment  [name] wants to schedule with RD but has missed phone calls.    Expected Outcome  Short - [name] will meet with the RD ASAP.  Long - [name] will follow RD advice.       Psychosocial: Target Goals: Acknowledge presence or absence of significant depression and/or stress, maximize coping skills, provide positive support system. Participant is able to verbalize types and ability to use techniques and skills needed for reducing stress and depression.   Initial Review & Psychosocial Screening: Initial Psych Review & Screening - 07/18/17 1332      Initial Review   Current issues with  Current Depression;History of Depression;Current Psychotropic Meds;Current Sleep Concerns;Current Stress Concerns    Source of Stress Concerns  Financial;Family    Comments  Husband at [facility] for Dementia. Had to file for Medicaid-fianances hard.     Lives alone.  Has great support from children      [place]?  Yes 2 daughters and 1 son      Barriers   Psychosocial barriers to participate in program  There are no identifiable barriers or psychosocial needs.;The patient should benefit from training in stress management and relaxation.      Screening Interventions   Interventions  Encouraged to exercise;To provide support and resources with identified psychosocial needs;Provide feedback about the scores to participant;Program counselor consult;Yes    Expected Outcomes  Long Term Goal: Stressors or current issues are controlled or eliminated.;Short Term goal: Utilizing psychosocial counselor, staff and physician to assist with identification of specific Stressors or  current issues interfering with healing process. Setting desired goal for each stressor or current issue identified.;Short Term goal: Identification and review with participant of any Quality of Life or Depression concerns found by scoring the questionnaire.;Long Term goal: The participant improves quality of Life and PHQ9 Scores as seen by post scores and/or verbalization of changes Reviewed scores with [name]. Talked to her about how attending the program and taking steps to care for herself may help improve her scores.         Quality of Life Scores:  Quality of Life - 09/19/17 1101      Quality of Life Scores   Health/Function Pre  10.18 %    Health/Function Post  17.07 %    Health/Function % Change  67.68 %    Socioeconomic Pre  16.25 %    Socioeconomic Post  18 %    Socioeconomic % Change   10.77 %    Psych/Spiritual Pre  10.1 %    Psych/Spiritual Post  24.43 %  Psych/Spiritual % Change  141.88 %    Family Pre  15 %    Family Post  12.75 %    Family % Change  -15 %    GLOBAL Pre  11.78 %    GLOBAL Post  18.37 %    GLOBAL % Change  55.94 %      Scores of 19 and below usually indicate a poorer quality of life in these areas.  A difference of  2-3 points is a clinically meaningful difference.  A difference of 2-3 points in the total score of the Quality of Life Index has been associated with significant improvement in overall quality of life, self-image, physical symptoms, and general health in studies assessing change in quality of life.  PHQ-9: Recent Review Flowsheet Data    Depression screen [facility] 2/9 09/19/2017 07/18/2017   Decreased Interest 0 0   Down, Depressed, Hopeless 1 0   PHQ - 2 Score 1 0   Altered sleeping 1 1    Tired, decreased energy 1 3   Change in appetite 1 2   Feeling bad or failure about yourself  0 0   Trouble concentrating 0 0   Moving slowly or fidgety/restless 1 0   Suicidal thoughts 0 0   PHQ-9 Score 5 6   Difficult doing work/chores - Not difficult  at all     Interpretation of Total Score  Total Score Depression Severity:  1-4 = Minimal depression, 5-9 = Mild depression, 10-14 = Moderate depression, 15-19 = Moderately severe depression, 20-27 = Severe depression   Psychosocial Evaluation and Intervention: Psychosocial Evaluation - 07/31/17 1720      Psychosocial Evaluation & Interventions   Interventions  Stress management education;Therapist referral;Encouraged to exercise with the program and follow exercise prescription;Relaxation education    Comments  Counselor met with Ms. Raliegh Ip [name]) for initial psychosocial evaluation.  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  [name] has a strong support system with her two daughters and a son who live locally.  She has 6 grandchildren and 6 Great grandchildren.  [name] also has a spouse who has had dementia since 2010 and is currently in a nursing home.  [name] has struggled with her own health issues in addition to her heart condition, she is a breast cancer survivor for 3 years now cancer free.  She is tired a lot and is on new medications that are "too expensive" as [name] lives on a fixed and limited income.  She has a history of depression and anxiety and is on medications for this; but reports feeling "edgy often."  [name] states her mood is "stable" but she was very tearful today discussing her stressors - particularly financial.  She has goals to be healthier; improve her diet and lose some weight while in this program.  Counselor provided some stress management strategies for [name] today and the class practiced mindfulness and relaxation altogether.  This counselor recommended [name] to see a therapist and she is concerned about the cost of this.  Counselor will look into options and get those to [place] next week.  She agreed to give each of her adult children something they can do to help alleviate some stress tomorrow when she sees them for Thanksgiving.  [name] is hesitant to allow  others to know her needs and tries to be strong.  But it is time to allow others to help. She agreed to do  so.  Counselor will follow with [name] re: this and overall while she is in this program.      Expected Outcomes  [name] will benefit from consistent exercise to achieve her stated goals.  She will meet with the dietician to address her weight loss goal.  [name] agreed to see a therapist as long as there is no co-pay involved.  Counselor will research and provide info on this next week.  [name] will begin sharing her needs with others more openly to help her through this stressful time.      Continue Psychosocial Services   Follow up required by counselor       Psychosocial Re-Evaluation: Psychosocial Re-Evaluation    [place] Name 07/30/17 [phone] 08/06/17 0953 08/22/17 0840 09/12/17 1055 09/17/17 0947     Psychosocial Re-Evaluation   Current issues with  Current Depression;Current Anxiety/Panic;Current Stress Concerns  Current Anxiety/Panic;History of Depression;Current Depression;Current Stress Concerns  -  Current Stress Concerns  -   Comments  [name] broke down today while talking about how she is doing.  Her husband is in [place] with dementia since 2010.  She only goes to see him once a week as it is too hard on her mentally to see him.  He has a minimal lucid moment and usally does not recognize her.  Their 61 year anniversary was on 11/23/22 and he was not aware at all.  This really hit her hard.  She is also stuggling financially with caring for him, paying for care, and paying for her medications.  I gave her contact information for the medication management clinic to get help with her medications.  She also really needs to talk with Juliann Pulse to establish care with a psychologist to work on her anxiety and depression.  She has symptoms of both.  Her anxiety has now reached a point of causing chest pain any time she gets upset over her husband or finances.  She is also stressed by juggling all of her medical care  and appointments.  And to add to it, she would like to remain strong for her family.  She has always been the strong matriarch of her family, she does not want to burden her kids but they want to know what is going on.  She got teary eyed while we discussing everything.  I encouraged her to get involved with her classmates and with heart sisters to establish a support system with similar circumstances.  She is excited about what we can do to help.  She would also like help to find a cheaper place to live to help with finances.  Counselor follow up today with [name].  She reports having a great thanksgiving and was in a better mood today - more relaxed.  Counselor provided contact information for a therapist for [name] and she appeared relieved to know there is someone to help her during this time.  She agreed to call today to set an appointment.  Counselor will continue to follow.    Counselor follow up with [name] reporting feeling better emotionally since began this class.  She is realizing the value of self care.  [name] has called the therapist this counselor suggested, but no response yet.  Counselor left a message for the therapist and suggested [name] try her again.  Counselor commended [name] for her positive self care and exercising consistently.  Counselor will continue to follow.    [name] mentioned to staff that her husband passed away 11/23/22.  She stated that  has taken a load off her stress wise.    Counselor follow up today with [name] reporting her spouse passed the end of December.  She states her mood and sleep have improved since that time as she was so worried and concerned about him all the time.  She has a different type of stress currently with finalizing all the details of his estate, but she is exercising consistently and feels she can take that one day at a time.  Counselor commended [name] for her positive attitude and her self-care.  She reports no longer a need for a counselor and will not pursue that option  further.     Expected Outcomes  Short: Meet with Juliann Pulse next week.  Long: Get help with anxiety and depression.   [name] will call the therapist to begin counseling services to help her through this stressful time in her life.    [name] will continue exercising for her health and mental health and practice positive self care.  She will call the therapist again to begin mental health support.  Short - [name] will continue to attend HT to help manage stress Long - [name] will be better able to practice self care   -   Interventions  -  Therapist referral;Stress management education  Relaxation education  -  -   Continue Psychosocial Services   Follow up required by counselor  Follow up required by counselor  Follow up required by counselor  -  Follow up required by staff   Comments  Husband at [facility] for Dementia. Had to file for Medicaid-fianances hard.     Lives alone.  Has great support from children  -  -  -  -     Initial Review   Source of Stress Concerns  Chronic Illness;Unable to participate in former interests or hobbies;Family;Financial  -  -  -  -   [place] Name 09/24/17 [phone]             Psychosocial Re-Evaluation   Current issues with  Current Stress Concerns       Comments  Anns husband passed away recently.  She states she feels much less stressed now       Expected Outcomes  Short - continue exercise Long - continue HT and stress techniques learned in program       Interventions  Encouraged to attend Cardiac Rehabilitation for the exercise       Continue Psychosocial Services   Follow up required by staff          Psychosocial Discharge (Final Psychosocial Re-Evaluation): Psychosocial Re-Evaluation - 09/24/17 0936      Psychosocial Re-Evaluation   Current issues with  Current Stress Concerns    Comments  Anns husband passed away recently.  She states she feels much less stressed now    Expected Outcomes  Short - continue exercise Long - continue HT and stress techniques learned in program     Interventions  Encouraged to attend Cardiac Rehabilitation for the exercise    Continue Psychosocial Services   Follow up required by staff       Vocational Rehabilitation: Provide vocational rehab assistance to qualifying candidates.   Vocational Rehab Evaluation & Intervention: Vocational Rehab - 07/18/17 1340      Initial Vocational Rehab Evaluation & Intervention   Assessment shows need for Vocational Rehabilitation  No       Education: Education Goals: Education classes will be provided on a variety of topics geared toward  better understanding of heart health and risk factor modification. Participant will state understanding/return demonstration of topics presented as noted by education test scores.  Learning Barriers/Preferences: Learning Barriers/Preferences - 07/18/17 1339      Learning Barriers/Preferences   Learning Barriers  None    Learning Preferences  Individual Instruction;Verbal Instruction       Education Topics:  AED/CPR: - Group verbal and written instruction with the use of models to demonstrate the basic use of the AED with the basic ABC's of resuscitation.   General Nutrition Guidelines/Fats and Fiber: -Group instruction provided by verbal, written material, models and posters to present the general guidelines for heart healthy nutrition. Gives an explanation and review of dietary fats and fiber.   Cardiac Rehab from 10/08/2017 in [facility] Cardiac and Pulmonary Rehab  Date  10/08/17  Educator  CR  Instruction Review Code  1- Verbalizes Understanding      Controlling Sodium/Reading Food Labels: -Group verbal and written material supporting the discussion of sodium use in heart healthy nutrition. Review and explanation with models, verbal and written materials for utilization of the food label.   Cardiac Rehab from 10/08/2017 in [facility] Cardiac and Pulmonary Rehab  Date  08/27/17  Educator  CR  Instruction Review Code  1- Verbalizes Understanding       Exercise Physiology & General Exercise Guidelines: - Group verbal and written instruction with models to review the exercise physiology of the cardiovascular system and associated critical values. Provides general exercise guidelines with specific guidelines to those with heart or lung disease.    Cardiac Rehab from 10/08/2017 in [facility] Cardiac and Pulmonary Rehab  Date  09/05/17  Educator  [facility]  Instruction Review Code  1- Verbalizes Understanding      Aerobic Exercise & Resistance Training: - Gives group verbal and written instruction on the various components of exercise. Focuses on aerobic and resistive training programs and the benefits of this training and how to safely progress through these programs..   Cardiac Rehab from 10/08/2017 in [facility] Cardiac and Pulmonary Rehab  Date  09/12/17  Educator  [facility]  Instruction Review Code  1- Verbalizes Understanding      Flexibility, Balance, Mind/Body Relaxation: Provides group verbal/written instruction on the benefits of flexibility and balance training, including mind/body exercise modes such as yoga, pilates and tai chi.  Demonstration and skill practice provided.   Cardiac Rehab from 10/08/2017 in [facility] Cardiac and Pulmonary Rehab  Date  09/17/17  Educator  AS  Instruction Review Code  1- Verbalizes Understanding      Stress and Anxiety: - Provides group verbal and written instruction about the health risks of elevated stress and causes of high stress.  Discuss the correlation between heart/lung disease and anxiety and treatment options. Review healthy ways to manage with stress and anxiety.   Cardiac Rehab from 10/08/2017 in [facility] Cardiac and Pulmonary Rehab  Date  10/01/17  Educator  [facility]  Instruction Review Code  1- Verbalizes Understanding      Depression: - Provides group verbal and written instruction on the correlation between heart/lung disease and depressed mood, treatment options, and the stigmas associated with seeking  treatment.   Anatomy & Physiology of the Heart: - Group verbal and written instruction and models provide basic cardiac anatomy and physiology, with the coronary electrical and arterial systems. Review of Valvular disease and Heart Failure   Cardiac Rehab from 10/08/2017 in [facility] Cardiac and Pulmonary Rehab  Date  10/03/17  Educator  CE  Instruction Review  Code  1- Verbalizes Understanding      Cardiac Procedures: - Group verbal and written instruction to review commonly prescribed medications for heart disease. Reviews the medication, class of the drug, and side effects. Includes the steps to properly store meds and maintain the prescription regimen. (beta blockers and nitrates)   Cardiac Rehab from 10/08/2017 in [facility] Cardiac and Pulmonary Rehab  Date  07/30/17  Educator  SB  Instruction Review Code  1- Verbalizes Understanding      Cardiac Medications I: - Group verbal and written instruction to review commonly prescribed medications for heart disease. Reviews the medication, class of the drug, and side effects. Includes the steps to properly store meds and maintain the prescription regimen.   Cardiac Rehab from 10/08/2017 in [facility] Cardiac and Pulmonary Rehab  Date  09/24/17  Educator  SB  Instruction Review Code  1- Verbalizes Understanding      Cardiac Medications II: -Group verbal and written instruction to review commonly prescribed medications for heart disease. Reviews the medication, class of the drug, and side effects. (all other drug classes)   Cardiac Rehab from 10/08/2017 in [facility] Cardiac and Pulmonary Rehab  Date  09/19/17 [facility]]  Educator  [facility]  Instruction Review Code  1- Verbalizes Understanding       Go Sex-Intimacy & Heart Disease, Get SMART - Goal Setting: - Group verbal and written instruction through game format to discuss heart disease and the return to sexual intimacy. Provides group verbal and written material to discuss and apply goal setting through  the application of the S.M.A.R.T. Method.   Cardiac Rehab from 10/08/2017 in [facility] Cardiac and Pulmonary Rehab  Date  07/30/17  Educator  SB  Instruction Review Code  1- Verbalizes Understanding      Other Matters of the Heart: - Provides group verbal, written materials and models to describe Stable Angina and Peripheral Artery. Includes description of the disease process and treatment options available to the cardiac patient.   Cardiac Rehab from 10/08/2017 in [facility] Cardiac and Pulmonary Rehab  Date  10/03/17  Educator  CE  Instruction Review Code  1- Verbalizes Understanding      Exercise & Equipment Safety: - Individual verbal instruction and demonstration of equipment use and safety with use of the equipment.   Cardiac Rehab from 10/08/2017 in [facility] Cardiac and Pulmonary Rehab  Date  07/18/17  Educator  [facility]  Instruction Review Code  1- Verbalizes Understanding      Infection Prevention: - Provides verbal and written material to individual with discussion of infection control including proper hand washing and proper equipment cleaning during exercise session.   Cardiac Rehab from 10/08/2017 in [facility] Cardiac and Pulmonary Rehab  Date  07/18/17  Educator  [facility]  Instruction Review Code  1- Verbalizes Understanding      Falls Prevention: - Provides verbal and written material to individual with discussion of falls prevention and safety.   Cardiac Rehab from 10/08/2017 in [facility] Cardiac and Pulmonary Rehab  Date  07/18/17  Educator  [facility]  Instruction Review Code  1- Verbalizes Understanding      Diabetes: - Individual verbal and written instruction to review signs/symptoms of diabetes, desired ranges of glucose level fasting, after meals and with exercise. Acknowledge that pre and post exercise glucose checks will be done for 3 sessions at entry of program.   Cardiac Rehab from 10/08/2017 in [facility] Cardiac and Pulmonary Rehab  Date  07/18/17  Educator  Sb  Instruction Review Code  1-  Verbalizes Understanding      Know Your Numbers and Risk Factors: -Group verbal and written instruction about important numbers in your health.  Discussion of what are risk factors and how they play a role in the disease process.  Review of Cholesterol, Blood Pressure, Diabetes, and BMI and the role they play in your overall health.   Cardiac Rehab from 10/08/2017 in [facility] Cardiac and Pulmonary Rehab  Date  09/19/17 [facility]]  Educator  [facility]  Instruction Review Code  1- Verbalizes Understanding      Sleep Hygiene: -Provides group verbal and written instruction about how sleep can affect your health.  Define sleep hygiene, discuss sleep cycles and impact of sleep habits. Review good sleep hygiene tips.    Other: -Provides group and verbal instruction on various topics (see comments)   Knowledge Questionnaire Score: Knowledge Questionnaire Score - 09/19/17 1101      Knowledge Questionnaire Score   Pre Score  24/28    Post Score  27/28 reviewed results with pt today       Core Components/Risk Factors/Patient Goals at Admission: Personal Goals and Risk Factors at Admission - 07/18/17 1341      Core Components/Risk Factors/Patient Goals on Admission    Weight Management  Yes;Obesity;Weight Loss    Intervention  Weight Management: Develop a combined nutrition and exercise program designed to reach desired caloric intake, while maintaining appropriate intake of nutrient and fiber, sodium and fats, and appropriate energy expenditure required for the weight goal.;Weight Management/Obesity: Establish reasonable short term and long term weight goals.;Obesity: Provide education and appropriate resources to help participant work on and attain dietary goals.    Admit Weight  218 lb (98.9 kg)    Goal Weight: Short Term  215 lb (97.5 kg)    Goal Weight: Long Term  150 lb (68 kg)    Expected Outcomes  Short Term: Continue to assess and modify interventions until short term weight is  achieved;Long Term: Adherence to nutrition and physical activity/exercise program aimed toward attainment of established weight goal;Weight Loss: Understanding of general recommendations for a balanced deficit meal plan, which promotes 1-2 lb weight loss per week and includes a negative energy balance of [phone] kcal/d    Diabetes  Yes    Intervention  Provide education about signs/symptoms and action to take for hypo/hyperglycemia.;Provide education about proper nutrition, including hydration, and aerobic/resistive exercise prescription along with prescribed medications to achieve blood glucose in normal ranges: Fasting glucose 65-99 mg/dL    Expected Outcomes  Short Term: Participant verbalizes understanding of the signs/symptoms and immediate care of hyper/hypoglycemia, proper foot care and importance of medication, aerobic/resistive exercise and nutrition plan for blood glucose control.;Long Term: Attainment of HbA1C < 7%.    Hypertension  Yes    Intervention  Provide education on lifestyle modifcations including regular physical activity/exercise, weight management, moderate sodium restriction and increased consumption of fresh fruit, vegetables, and low fat dairy, alcohol moderation, and smoking cessation.;Monitor prescription use compliance.    Expected Outcomes  Short Term: Continued assessment and intervention until BP is < 140/79m HG in hypertensive participants. < 130/874mHG in hypertensive participants with diabetes, heart failure or chronic kidney disease.;Long Term: Maintenance of blood pressure at goal levels.    Lipids  Yes    Intervention  Provide education and support for participant on nutrition & aerobic/resistive exercise along with prescribed medications to achieve LDL <7015mHDL >33m74m  Expected Outcomes  Short Term: Participant states understanding of  desired cholesterol values and is compliant with medications prescribed. Participant is following exercise prescription and  nutrition guidelines.;Long Term: Cholesterol controlled with medications as prescribed, with individualized exercise RX and with personalized nutrition plan. Value goals: LDL < 94m, HDL > 40 mg.    Stress  Yes    Intervention  Offer individual and/or small group education and counseling on adjustment to heart disease, stress management and health-related lifestyle change. Teach and support self-help strategies.;Refer participants experiencing significant psychosocial distress to appropriate mental health specialists for further evaluation and treatment. When possible, include family members and significant others in education/counseling sessions.    Expected Outcomes  Short Term: Participant demonstrates changes in health-related behavior, relaxation and other stress management skills, ability to obtain effective social support, and compliance with psychotropic medications if prescribed.;Long Term: Emotional wellbeing is indicated by absence of clinically significant psychosocial distress or social isolation.       Core Components/Risk Factors/Patient Goals Review:  Goals and Risk Factor Review    Row Name 07/30/17 0905 07/30/17 1113 08/22/17 1010 09/24/17 0938 09/24/17 0949     Core Components/Risk Factors/Patient Goals Review   Personal Goals Review  -  Weight Management/Obesity;Hypertension;Lipids;Diabetes  Weight Management/Obesity;Hypertension  Other;Diabetes;Hypertension;Heart Failure;Weight Management/Obesity  Other;Diabetes;Hypertension;Heart Failure;Weight Management/Obesity   Review  -  ALelon Frohlichhas been doing well in rehab.  Her weight was up today, but she has been stress eating.  Her blood pressures and blood sugars have been good.  She checks them at home routinely and tracks them for her doctor.  She feels her medications are working for her for the most part. She has a lot of stress and anxiety which tirggers chest pain.  She is going to talk with KJuliann Pulseabout this too.  [name] saw Dr KHumphrey Rollsand  had an angiogram which she stated showed her heart is ok.  The PA cleared her to return to exercise.  She is still having some of the chest and neck burning so has met with GGertie FeyDoc.  She is having enzymes checked and is asking Dr for a referral to a vascular Dr.  She was advised to keep her exercise very lightly on her own.   Anns weight is up today - she knows to take extra Lasix as instructed by Dr.  PAleene Davidsonto get another scale for home use.  L leg is more swollen  BG has been between 150-200  Anns weight is up today - she knows to take extra Lasix as instructed by Dr.  PAleene Davidsonto get another scale for home use.  L leg is more swollen ALelon Frohlichwas instructed and agreed to call her Dr today about this. BG has been between 150-200   Expected Outcomes  -  Short: Continue to work on weight loss and meet to talk about stress and aniexty.  Long: Continue to monitor and work on risk factor modifications.   Short - ALelon Frohlichwill follow up with Dr recommendations.  Long - Anns Dr will help he resolve the issues so she can exercise without worry.    -  Short - ALelon Frohlichwill follow up with Dr recommendations.  Long - Anns Dr will help he resolve the issues so she can exercise without worry.        Core Components/Risk Factors/Patient Goals at Discharge (Final Review):  Goals and Risk Factor Review - 09/24/17 0949      Core Components/Risk Factors/Patient Goals Review   Personal Goals Review  Other;Diabetes;Hypertension;Heart Failure;Weight Management/Obesity    Review  Anns weight is up today - she knows to take extra Lasix as instructed by Dr.  Aleene Davidson to get another scale for home use.  L leg is more swollen [name] was instructed and agreed to call her Dr today about this. BG has been between 150-200    Expected Outcomes  Short - [name] will follow up with Dr recommendations.  Long - Anns Dr will help he resolve the issues so she can exercise without worry.         ITP Comments: ITP Comments    Row Name 07/18/17 1325 08/08/17  0923 08/14/17 0606 09/11/17 0655 09/19/17 1112   ITP Comments  medical review completed.  Initial ITP sent to Dr Elta Guadeloupe Sabra Heck for review, necessary changes and cosign.  Diagnosis documentation can be found in [facility]10/26/2018  [name] mentioned that she has had some "burning" in chest and neck similar to symptoms before her event.  She slowed down and the sensation went away.  She was advised to call her cardiologist today.  She saw him Monday but they did not discuss this symptom.  30 day review. Continue with ITP unless directed changes per Medical Director review.   30 day review. Continue with ITP unless directed changes per Medical Director review.   Addylynn "[name]" reported 8/10 jaw pain while exercising approx 10 minutes on the treadmill at only 2.54mh. With rest after several minutes her jaw and chest pain went away. "ALelon Frohlich is scheduled to see Dr. KHumphrey Rollstoday and she will tell him. Also faxed to his office information about this.    Row Name 09/26/17 1136 10/08/17 0835         ITP Comments  ALelon Frohlichis eligbile for Pulm Rehab so I sent a email [facility] referral request to Dr. KHumphrey Rollsasking him to send a referral please  Discharge ITP sent and signed by Dr. MSabra Heck  Discharge Summary routed to PCP and cardiologist.         Comments: Discharge ITP

## 2017-10-08 NOTE — Progress Notes (Signed)
Discharge Progress Report  Patient Details  Name: Tammy Hatfield MRN: [id number] Date of Birth: Oct 25, 1945 Referring Provider:     Cardiac Rehab from 07/18/2017 in [facility] Cardiac [facility]monary Re[facility]  Referring Provider  khan       Number of Visits: 36/36   Reason for Discharge:  Patient reached a stable level of exercise. Patient independent in their exercise. Patient has met program and personal goals.  Smoking History:  Social History   Tobacco Use  Smoking Status Former Smoker  . Packs/day: 2.00  . Years: 30.00  . Pack years: 60.00  . Last attempt to quit: 02/01/2000  . Years since quitting: 17.6  Smokeless Tobacco Never Used  Tobacco Comment   07/18/2017 former smoker quit 1999    Diagnosis:  Status post coronary artery stent placement  ADL UCSD:   Initial Exercise Prescription: Initial Exercise Prescription - 07/18/17 1400      Date of Initial Exercise RX and Referring Provider   Date  07/18/17    Referring Provider  khan      Treadmill   MPH  1.5    Grade  0    Minutes  15    METs  1.8      Recumbant Bike   Level  1    RPM  60    Watts  5    Minutes  15    METs  1.8      NuStep   Level  2    SPM  80    Minutes  15    METs  1.8      REL-XR   Level  1    Speed  50    Minutes  15    METs  1.8      Prescription Details   Frequency (times per week)  3    Duration  Progress to 45 minutes of aerobic exercise without signs/symptoms of physical distress      Intensity   THRR 40-80% of Max Heartrate  100-132    Ratings of Perceived Exertion  11-13    Perceived Dyspnea  0-4      Resistance Training   Training Prescription  Yes    Weight  2 lbs    Reps  10-15       Discharge Exercise Prescription (Final Exercise Prescription Changes): Exercise Prescription Changes - 09/30/17 1600      Response to Exercise   Blood Pressure (Admit)  110/58    Blood Pressure (Exercise)  130/54    Blood Pressure (Exit)  112/54    Heart Rate (Admit)   69 bpm    Heart Rate (Exercise)  107 bpm    Heart Rate (Exit)  84 bpm    Rating of Perceived Exertion (Exercise)  15    Symptoms  none    Duration  Continue with 30 min of aerobic exercise without signs/symptoms of physical distress.    Intensity  THRR unchanged      Progression   Progression  Continue to progress workloads to maintain intensity without signs/symptoms of physical distress.    Average METs  2.12      Resistance Training   Training Prescription  Yes    Weight  2 lbs    Reps  10-15      Interval Training   Interval Training  No      Treadmill   MPH  1.5    Grade  0    Minutes  15  METs  2.15      NuStep   Level  3    Minutes  15    METs  2.6      REL-XR   Level  4    Minutes  15    METs  2.2      Home Exercise Plan   Plans to continue exercise at  Home (comment) walking and gym in complex    Frequency  Add 2 additional days to program exercise sessions.    Initial Home Exercises Provided  08/13/17       Functional Capacity: 6 Minute Walk    Row Name 07/18/17 1429 09/26/17 0942       6 Minute Walk   Phase  -  Discharge    Distance  912 feet  1050 feet    Distance % Change  -  15 %    Distance Feet Change  -  138 ft    Walk Time  6 minutes  6 minutes    # of Rest Breaks  0  0    MPH  1.72  1.98    METS  1.8  1.9    RPE  14  15    Perceived Dyspnea   3  4    VO2 Peak  6.3  6.72    Symptoms  No  Yes (comment)    Comments  -  shortness of breath 4 leg fatigue - general fatigue    Resting HR  68 bpm  68 bpm    Resting BP  118/62  110/58    Resting Oxygen Saturation   95 %  -    Exercise Oxygen Saturation  during 6 min walk  95 %  98 %    Max Ex. HR  100 bpm  107 bpm    Max Ex. BP  156/66  130/54    2 Minute Post BP  130/64  -       Psychological, QOL, Others - Outcomes: PHQ 2/9: Depression screen [facility] 2/9 09/19/2017 07/18/2017  Decreased Interest 0 0  Down, Depressed, Hopeless 1 0  PHQ - 2 Score 1 0  Altered sleeping 1 1  Tired,  decreased energy 1 3  Change in appetite 1 2  Feeling bad or failure about yourself  0 0  Trouble concentrating 0 0  Moving slowly or fidgety/restless 1 0  Suicidal thoughts 0 0  PHQ-9 Score 5 6  Difficult doing work/chores - Not difficult at all    Quality of Life: Quality of Life - 09/19/17 1101      Quality of Life Scores   Health/Function Pre  10.18 %    Health/Function Post  17.07 %    Health/Function % Change  67.68 %    Socioeconomic Pre  16.25 %    Socioeconomic Post  18 %    Socioeconomic % Change   10.77 %    Psych/Spiritual Pre  10.1 %    Psych/Spiritual Post  24.43 %    Psych/Spiritual % Change  141.88 %    Family Pre  15 %    Family Post  12.75 %    Family % Change  -15 %    GLOBAL Pre  11.78 %    GLOBAL Post  18.37 %    GLOBAL % Change  55.94 %       Personal Goals: Goals established at orientation with interventions provided to work toward goal. Personal Goals and Risk Factors  at Admission - 07/18/17 1341      Core Components/Risk Factors/Patient Goals on Admission    Weight Management  Yes;Obesity;Weight Loss    Intervention  Weight Management: Develop a combined nutrition and exercise program designed to reach desired caloric intake, while maintaining appropriate intake of nutrient and fiber, sodium and fats, and appropriate energy expenditure required for the weight goal.;Weight Management/Obesity: Establish reasonable short term and long term weight goals.;Obesity: Provide education and appropriate resources to help participant work on and attain dietary goals.    Admit Weight  218 lb (98.9 kg)    Goal Weight: Short Term  215 lb (97.5 kg)    Goal Weight: Long Term  150 lb (68 kg)    Expected Outcomes  Short Term: Continue to assess and modify interventions until short term weight is achieved;Long Term: Adherence to nutrition and physical activity/exercise program aimed toward attainment of established weight goal;Weight Loss: Understanding of general  recommendations for a balanced deficit meal plan, which promotes 1-2 lb weight loss per week and includes a negative energy balance of [phone] kcal/d    Diabetes  Yes    Intervention  Provide education about signs/symptoms and action to take for hypo/hyperglycemia.;Provide education about proper nutrition, including hydration, and aerobic/resistive exercise prescription along with prescribed medications to achieve blood glucose in normal ranges: Fasting glucose 65-99 mg/dL    Expected Outcomes  Short Term: Participant verbalizes understanding of the signs/symptoms and immediate care of hyper/hypoglycemia, proper foot care and importance of medication, aerobic/resistive exercise and nutrition plan for blood glucose control.;Long Term: Attainment of HbA1C < 7%.    Hypertension  Yes    Intervention  Provide education on lifestyle modifcations including regular physical activity/exercise, weight management, moderate sodium restriction and increased consumption of fresh fruit, vegetables, and low fat dairy, alcohol moderation, and smoking cessation.;Monitor prescription use compliance.    Expected Outcomes  Short Term: Continued assessment and intervention until BP is < 140/77m HG in hypertensive participants. < 130/816mHG in hypertensive participants with diabetes, heart failure or chronic kidney disease.;Long Term: Maintenance of blood pressure at goal levels.    Lipids  Yes    Intervention  Provide education and support for participant on nutrition & aerobic/resistive exercise along with prescribed medications to achieve LDL <703mHDL >66m35m  Expected Outcomes  Short Term: Participant states understanding of desired cholesterol values and is compliant with medications prescribed. Participant is following exercise prescription and nutrition guidelines.;Long Term: Cholesterol controlled with medications as prescribed, with individualized exercise RX and with personalized nutrition plan. Value goals: LDL <  70mg48mL > 40 mg.    Stress  Yes    Intervention  Offer individual and/or small group education and counseling on adjustment to heart disease, stress management and health-related lifestyle change. Teach and support self-help strategies.;Refer participants experiencing significant psychosocial distress to appropriate mental health specialists for further evaluation and treatment. When possible, include family members and significant others in education/counseling sessions.    Expected Outcomes  Short Term: Participant demonstrates changes in health-related behavior, relaxation and other stress management skills, ability to obtain effective social support, and compliance with psychotropic medications if prescribed.;Long Term: Emotional wellbeing is indicated by absence of clinically significant psychosocial distress or social isolation.        Personal Goals Discharge: Goals and Risk Factor Review    Row Name 07/30/17 0905 07/30/17 1113 08/22/17 1010 09/24/17 0938 09/24/17 0949     Core Components/Risk Factors/Patient Goals Review   Personal Goals Review  -  Weight Management/Obesity;Hypertension;Lipids;Diabetes  Weight Management/Obesity;Hypertension  Other;Diabetes;Hypertension;Heart Failure;Weight Management/Obesity  Other;Diabetes;Hypertension;Heart Failure;Weight Management/Obesity   Review  -  Tammy Hatfield has been doing well in rehab.  Her weight was up today, but she has been stress eating.  Her blood pressures and blood sugars have been good.  She checks them at home routinely and tracks them for her doctor.  She feels her medications are working for her for the most part. She has a lot of stress and anxiety which tirggers chest pain.  She is going to talk with Juliann Pulse about this too.  [name] saw Dr Humphrey Rolls and had an angiogram which she stated showed her heart is ok.  The PA cleared her to return to exercise.  She is still having some of the chest and neck burning so has met with Gertie Fey Doc.  She is having  enzymes checked and is asking Dr for a referral to a vascular Dr.  She was advised to keep her exercise very lightly on her own.   Anns weight is up today - she knows to take extra Lasix as instructed by Dr.  Aleene Davidson to get another scale for home use.  L leg is more swollen  BG has been between 150-200  Anns weight is up today - she knows to take extra Lasix as instructed by Dr.  Aleene Davidson to get another scale for home use.  L leg is more swollen Tammy Hatfield was instructed and agreed to call her Dr today about this. BG has been between 150-200   Expected Outcomes  -  Short: Continue to work on weight loss and meet to talk about stress and aniexty.  Long: Continue to monitor and work on risk factor modifications.   Short - Tammy Hatfield will follow up with Dr recommendations.  Long - Anns Dr will help he resolve the issues so she can exercise without worry.    -  Short - Tammy Hatfield will follow up with Dr recommendations.  Long - Anns Dr will help he resolve the issues so she can exercise without worry.        Exercise Goals and Review: Exercise Goals    Row Name 07/18/17 1427             Exercise Goals   Intervention  Provide advice, education, support and counseling about physical activity/exercise needs.;Develop an individualized exercise prescription for aerobic and resistive training based on initial evaluation findings, risk stratification, comorbidities and participant's personal goals.       Expected Outcomes  Achievement of increased cardiorespiratory fitness and enhanced flexibility, muscular endurance and strength shown through measurements of functional capacity and personal statement of participant.       Increase Strength and Stamina  Yes       Intervention  Provide advice, education, support and counseling about physical activity/exercise needs.;Develop an individualized exercise prescription for aerobic and resistive training based on initial evaluation findings, risk stratification, comorbidities and participant's  personal goals.       Expected Outcomes  Achievement of increased cardiorespiratory fitness and enhanced flexibility, muscular endurance and strength shown through measurements of functional capacity and personal statement of participant.       Able to understand and use rate of perceived exertion (RPE) scale  Yes       Intervention  Provide education and explanation on how to use RPE scale       Expected Outcomes  Short Term: Able to use RPE daily in rehab to express subjective intensity level;Long Term:  Able  to use RPE to guide intensity level when exercising independently       Able to understand and use Dyspnea scale  Yes       Intervention  Provide education and explanation on how to use Dyspnea scale       Expected Outcomes  Short Term: Able to use Dyspnea scale daily in rehab to express subjective sense of shortness of breath during exertion;Long Term: Able to use Dyspnea scale to guide intensity level when exercising independently       Knowledge and understanding of Target Heart Rate Range (THRR)  Yes       Intervention  Provide education and explanation of THRR including how the numbers were predicted and where they are located for reference       Expected Outcomes  Short Term: Able to state/look up THRR;Long Term: Able to use THRR to govern intensity when exercising independently;Short Term: Able to use daily as guideline for intensity in rehab       Able to check pulse independently  Yes       Intervention  Provide education and demonstration on how to check pulse in carotid and radial arteries.;Review the importance of being able to check your own pulse for safety during independent exercise       Expected Outcomes  Short Term: Able to explain why pulse checking is important during independent exercise;Long Term: Able to check pulse independently and accurately       Understanding of Exercise Prescription  Yes       Intervention  Provide education, explanation, and written materials on  patient's individual exercise prescription       Expected Outcomes  Short Term: Able to explain program exercise prescription;Long Term: Able to explain home exercise prescription to exercise independently          Nutrition & Weight - Outcomes: Pre Biometrics - 07/18/17 1427      Pre Biometrics   Height  5' 4.5" (1.638 m)    Weight  218 lb (98.9 kg)    Waist Circumference  40 inches    Hip Circumference  49.75 inches    Waist to Hip Ratio  0.8 %    BMI (Calculated)  36.86    Single Leg Stand  5.31 seconds        Nutrition: Nutrition Therapy & Goals - 09/26/17 0958      Nutrition Therapy   Diet  diabetic / heart healthy    Protein (specify units)  10-11oz    Fiber  25 grams    Saturated Fats  13 max. grams    Fruits and Vegetables  6 servings/day    Sodium  1500 grams      Personal Nutrition Goals   Nutrition Goal  Practice portioning out carbohyrate servings using guidelines provided    Personal Goal #2  Aim to eat about the same amount of carbohydrates at each meal (45g) and snack (15-30g) and eat on a consistent schedule    Personal Goal #3  Continue to choose foods that are low in sodium, added sugars and total fat      Intervention Plan   Intervention  Nutrition handout(s) given to patient.;Prescribe, educate and counsel regarding individualized specific dietary modifications aiming towards targeted core components such as weight, hypertension, lipid management, diabetes, heart failure and other comorbidities.    Expected Outcomes  Short Term Goal: Understand basic principles of dietary content, such as calories, fat, sodium, cholesterol and nutrients.;Short Term Goal: A plan  has been developed with personal nutrition goals set during dietitian appointment.;Long Term Goal: Adherence to prescribed nutrition plan.       Nutrition Discharge: Nutrition Assessments - 09/19/17 1101      MEDFICTS Scores   Pre Score  89    Post Score  83    Score Difference  -6        Education Questionnaire Score: Knowledge Questionnaire Score - 09/19/17 1101      Knowledge Questionnaire Score   Pre Score  24/28    Post Score  27/28 reviewed results with pt today       Goals reviewed with patient; copy given to patient.

## 2017-10-08 NOTE — Progress Notes (Signed)
Daily Session Note  Patient Details  Name: Tammy Hatfield MRN: [id number] Date of Birth: June 20, 1946 Referring Provider:     Cardiac Rehab from 07/18/2017 in [facility] Cardiac and Pulmonary Rehab  Referring Provider  khan      Encounter Date: 10/08/2017  Check In: Session Check In - 10/08/17 0826      Check-In   Location  [facility]-Cardiac & Pulmonary Rehab    Staff Present  Alberteen Sam, MA, RCEP, CCRP, Exercise Physiologist;Amanda Oletta Darter, BA, ACSM CEP, Exercise Physiologist;Tammy Bice, RN, BSN, CCRP    Supervising physician immediately available to respond to emergencies  See telemetry face sheet for immediately available ER MD    Medication changes reported      No    Fall or balance concerns reported     No    Warm-up and Cool-down  Performed on first and last piece of equipment    Resistance Training Performed  Yes    VAD Patient?  No      Pain Assessment   Currently in Pain?  No/denies          Social History   Tobacco Use  Smoking Status Former Smoker  . Packs/day: 2.00  . Years: 30.00  . Pack years: 60.00  . Last attempt to quit: 02/01/2000  . Years since quitting: 17.6  Smokeless Tobacco Never Used  Tobacco Comment   07/18/2017 former smoker quit 1999    Goals Met:  Independence with exercise equipment Exercise tolerated well No report of cardiac concerns or symptoms Strength training completed today  Goals Unmet:  Not Applicable  Comments: Pt able to follow exercise prescription today without complaint.  Will continue to monitor for progression.  [name] graduated today from cardiac rehab with 36 sessions completed.  Details of the patient's exercise prescription and what She needs to do in order to continue the prescription and progress were discussed with patient.  Patient was given a copy of prescription and goals.  Patient verbalized understanding.  [name] plans to continue to exercise by walking at home and using the gym in her complex.    Dr. Emily Filbert is Medical Director for [facility].

## 2017-12-29 NOTE — Progress Notes (Deleted)
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  INTERVAL HISTORY: Patient returns to clinic today for routine 6 month evaluation. Patient continues to refuse adjuvant therapy with an aromatase inhibitor. She currently feels well and is asymptomatic.  She denies any neurologic symptoms.  She has a good appetite.  She denies any chest pain or shortness of breath.  She denies any nausea, vomiting, constipation, or diarrhea.  She has no urinary complaints.  Patient offers no specific complaints today.   REVIEW OF SYSTEMS:   Review of Systems  Constitutional: Negative.  Negative for fever, malaise/fatigue and weight loss.  Respiratory: Negative.  Negative for sputum production.   Cardiovascular: Negative.  Negative for chest pain and leg swelling.  Gastrointestinal: Negative.  Negative for abdominal pain.  Genitourinary: Negative.   Musculoskeletal: Negative.   Skin: Negative.  Negative for rash.  Neurological: Negative.  Negative for sensory change and weakness.  Psychiatric/Behavioral: Negative.  The patient is not nervous/anxious.     As per HPI. Otherwise, a complete review of systems is negative.  PAST MEDICAL HISTORY: Past Medical History:  Diagnosis Date  . Arthritis    knees  . Barrett's esophagus   . Breast cancer ([place]) 2014   RT LUMPECTOMY  . Cancer [facility]) 2014   right breast - s\p Radiation and lumpectomy and LN dissection  . Chronic sinusitis   . COPD (chronic obstructive pulmonary disease) ([place]) 2012  . Degenerative lumbar disc   . Diabetes mellitus without complication ([place])   . Dysphagia   . Esophageal reflux   . History of Helicobacter pylori  infection   . Hypercholesteremia   . Hyperlipidemia   . Hypertension   . Personal history of radiation therapy   . Radiation 2014   RT BREAST CA  . Ulcer, stomach peptic    history of    PAST SURGICAL HISTORY: Past Surgical History:  Procedure Laterality Date  . ABDOMINAL HYSTERECTOMY    . BREAST BIOPSY Right 1985   NEG  . BREAST LUMPECTOMY Right 2013  . BUNIONECTOMY Bilateral 1980  . CORONARY STENT INTERVENTION N/A 07/05/2017   Procedure: CORONARY STENT INTERVENTION;  Surgeon: [name], MD;  Location: [facility];  Service: Cardiovascular;  Laterality: N/A;  . LAPAROSCOPIC TUBAL LIGATION Bilateral 1967  . LEFT HEART CATH AND CORONARY ANGIOGRAPHY N/A 07/05/2017   Procedure: LEFT HEART CATH AND CORONARY ANGIOGRAPHY;  Surgeon: [name], MD;  Location: [place] [facility];  Service: Cardiovascular;  Laterality: N/A;  . SHOULDER ARTHROSCOPY WITH ROTATOR CUFF REPAIR Right 02/09/2015   Procedure: SHOULDER ARTHROSCOPY WITH ROTATOR CUFF REPAIR,release long head biceps tendon,subacromial decompression.;  Surgeon: Leanor Kail, MD;  Location: [facility] ORS;  Service: Orthopedics;  Laterality: Right;  . UMBILICAL HERNIA REPAIR N/A   . UVULOPALATOPHARYNGOPLASTY N/A 2001    FAMILY HISTORY Family History  Problem Relation Age of Onset  . Breast cancer Neg Hx        ADVANCED DIRECTIVES:    HEALTH MAINTENANCE: Social History   Tobacco Use  . Smoking status: Former Smoker    Packs/day: 2.00    Years: 30.00    Pack years: 60.00    Last attempt to quit: 02/01/2000    Years since  quitting: 17.9  . Smokeless tobacco: Never Used  . Tobacco comment: 07/18/2017 former smoker quit 1999  Substance Use Topics  . Alcohol use: No    Alcohol/week: 0.0 oz  . Drug use: No     Colonoscopy:  PAP:  Bone density:  Lipid panel:  Allergies  Allergen Reactions  . Metformin And Related Diarrhea  . Amoxicillin Other (See Comments)    Reaction: Yeast infection  . Avelox  [Moxifloxacin] Other (See Comments)    Reaction: Muscle pain  . Benazepril Cough  . Bupropion     Other reaction(s): Dizziness, Headache  . Byetta 10 Mcg Pen [Exenatide] Diarrhea  . Metformin Diarrhea  . Neurontin [Gabapentin] Other (See Comments)    Mouth blisters, joint pain, depression Reaction: Mouth blister, joint pain and depression  . Statins Other (See Comments)    Reaction: Mouth blisters and joint pain  . Sulfa Antibiotics Other (See Comments)    Reaction: Headache  . Ceclor [Cefaclor] Rash    Current Outpatient Medications  Medication Sig Dispense Refill  . albuterol (PROVENTIL HFA;VENTOLIN HFA) 108 (90 BASE) MCG/ACT inhaler Inhale 2 puffs into the lungs every 4 (four) hours as needed for wheezing or shortness of breath.    . ALPRAZolam (XANAX) 0.5 MG tablet Take 0.5 mg by mouth 2 (two) times daily as needed for anxiety.    Marland Kitchen aspirin 81 MG chewable tablet Chew 1 tablet (81 mg total) by mouth daily.    Marland Kitchen glipiZIDE (GLUCOTROL) 10 MG tablet Take 10 mg by mouth daily before breakfast.    . insulin NPH-regular Human (NOVOLIN 70/30) (70-30) 100 UNIT/ML injection Inject 15-25 Units into the skin 2 (two) times daily with a meal.     . isosorbide mononitrate (IMDUR) 30 MG 24 hr tablet Take 1 tablet (30 mg total) by mouth daily. 30 tablet 0  . loratadine (CLARITIN) 10 MG tablet Take 10 mg by mouth daily as needed.     Marland Kitchen losartan (COZAAR) 50 MG tablet Take 50 mg by mouth every morning.     . metoprolol succinate (TOPROL-XL) 25 MG 24 hr tablet     . metoprolol tartrate (LOPRESSOR) 25 MG tablet Take 1 tablet (25 mg total) by mouth 2 (two) times daily. 60 tablet 0  . nitroGLYCERIN (NITROSTAT) 0.4 MG SL tablet Place 1 tablet (0.4 mg total) under the tongue every 5 (five) minutes as needed for chest pain. 30 tablet 0  . omeprazole (PRILOSEC) 20 MG capsule Take 20 mg by mouth 2 (two) times daily.     Marland Kitchen PARoxetine (PAXIL) 40 MG tablet Take 20 mg by mouth 2 (two) times daily.     . ticagrelor  (BRILINTA) 90 MG TABS tablet Take 1 tablet (90 mg total) by mouth 2 (two) times daily. 60 tablet 0  . traMADol (ULTRAM) 50 MG tablet Take 50 mg by mouth every 6 (six) hours as needed for moderate pain.     . valACYclovir (VALTREX) 500 MG tablet TAKE ONE CAPLET BY MOUTH TWICE DAILY FOR 3 DAYS AS NEEDED FOR  FLARE     No current facility-administered medications for this visit.     OBJECTIVE: There were no vitals filed for this visit.   There is no height or weight on file to calculate BMI.    ECOG FS:0 - Asymptomatic  General: Well-developed, well-nourished, no acute distress. Eyes: Pink conjunctiva, anicteric sclera. Breasts: Bilateral breasts and axilla without lumps or masses. Lungs: Clear to auscultation bilaterally. Heart: Regular rate and rhythm. No  rubs, murmurs, or gallops. Abdomen: Soft, nontender, nondistended. No organomegaly noted, normoactive bowel sounds. Musculoskeletal: No edema, cyanosis, or clubbing. Neuro: Alert, answering all questions appropriately. Cranial nerves grossly intact. Skin: No rashes or petechiae noted. Psych: Normal affect.   LAB RESULTS:  Lab Results  Component Value Date   NA 140 07/06/2017   K 4.2 07/06/2017   CL 105 07/06/2017   CO2 28 07/06/2017   GLUCOSE 151 (H) 07/06/2017   BUN 19 07/06/2017   CREATININE 1.00 07/06/2017   CALCIUM 8.8 (L) 07/06/2017   PROT 6.7 02/15/2015   ALBUMIN 3.4 (L) 02/15/2015   AST 47 (H) 02/15/2015   ALT 310 (H) 02/15/2015   ALKPHOS 109 02/15/2015   BILITOT 0.7 02/15/2015   GFRNONAA 55 (L) 07/06/2017   GFRAA >60 07/06/2017    Lab Results  Component Value Date   WBC 5.9 07/06/2017   NEUTROABS 4.4 12/31/2016   HGB 12.9 07/06/2017   HCT 38.1 07/06/2017   MCV 90.3 07/06/2017   PLT 182 07/06/2017     STUDIES: No results found.  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  PLAN:    1. 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  She completed XRT in approximately January 2015. Patient could not tolerate letrozole, anastrozole, or Aromasin. She elected to discontinue all aromatase inhibitors despite knowing her risk of recurrence increases. Her most recent mammogram On July 05, 2016 was reported as BI-RADS 2, repeat in October 2018. Since patient is greater than 2 years removed from her XRT and is no longer taking adjuvant hormonal treatment, she can return to clinic in 1 year for further evaluation. 2. Anemia: Resolved.  Patient expressed understanding and was in agreement with this plan. She also understands that She can call clinic at any time with any questions, concerns, or complaints.    Lloyd Huger, MD   12/29/2017 11:44 PM

## 2017-12-31 ENCOUNTER — Inpatient Hospital Stay: Payer: Medicare HMO

## 2017-12-31 ENCOUNTER — Inpatient Hospital Stay: Payer: Medicare HMO | Admitting: Oncology

## 2018-01-26 NOTE — Progress Notes (Signed)
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  INTERVAL HISTORY: Patient returns to clinic today for routine yearly evaluation.  She continues to feel well and remains asymptomatic.  She denies any recent fevers or illnesses.  She has no neurologic complaints.  She has a good appetite and denies weight loss. She denies any chest pain or shortness of breath.  She denies any nausea, vomiting, constipation, or diarrhea.  She has no urinary complaints.  Patient feels at her baseline offers no specific complaints today.  REVIEW OF SYSTEMS:   Review of Systems  Constitutional: Negative.  Negative for fever, malaise/fatigue and weight loss.  Respiratory: Negative.  Negative for sputum production.   Cardiovascular: Negative.  Negative for chest pain and leg swelling.  Gastrointestinal: Negative.  Negative for abdominal pain.  Genitourinary: Negative.  Negative for dysuria.  Musculoskeletal: Negative.  Negative for back pain.  Skin: Negative.  Negative for rash.  Neurological: Negative.  Negative for sensory change, focal weakness and weakness.  Psychiatric/Behavioral: Negative.  The patient is not nervous/anxious.     As per HPI. Otherwise, a complete review of systems is negative.  PAST MEDICAL HISTORY: Past Medical History:  Diagnosis Date  . Arthritis    knees  . Barrett's esophagus   . Breast cancer ([facility]) 2014   RT LUMPECTOMY  . Cancer ([facility]) 2014   right breast - s\p Radiation and lumpectomy and LN dissection  . Chronic sinusitis   . COPD (chronic obstructive pulmonary disease) ([facility]) 2012  . Degenerative lumbar disc   . Diabetes mellitus without complication ([facility])    . Dysphagia   . Esophageal reflux   . History of Helicobacter pylori infection   . Hypercholesteremia   . Hyperlipidemia   . Hypertension   . Personal history of radiation therapy   . Radiation 2014   RT BREAST CA  . Ulcer, stomach peptic    history of    PAST SURGICAL HISTORY: Past Surgical History:  Procedure Laterality Date  . ABDOMINAL HYSTERECTOMY    . BREAST BIOPSY Right 1985   NEG  . BREAST LUMPECTOMY Right 2013  . BUNIONECTOMY Bilateral 1980  . CORONARY STENT INTERVENTION N/A 07/05/2017   Procedure: CORONARY STENT INTERVENTION;  Surgeon: Callwood, Dwayne D, MD;  Location: [facility];  Service: Cardiovascular;  Laterality: N/A;  . LAPAROSCOPIC TUBAL LIGATION Bilateral 1967  . LEFT HEART CATH AND CORONARY ANGIOGRAPHY N/A 07/05/2017   Procedure: LEFT HEART CATH AND CORONARY ANGIOGRAPHY;  Surgeon: Khan, Shaukat A, MD;  Location: [facility];  Service: Cardiovascular;  Laterality: N/A;  . SHOULDER ARTHROSCOPY WITH ROTATOR CUFF REPAIR Right 02/09/2015   Procedure: SHOULDER ARTHROSCOPY WITH ROTATOR CUFF REPAIR,release long head biceps tendon,subacromial decompression.;  Surgeon: Harold Kernodle, MD;  Location: [facility];  Service: Orthopedics;  Laterality: Right;  . UMBILICAL HERNIA REPAIR N/A   . UVULOPALATOPHARYNGOPLASTY N/A 2001    FAMILY HISTORY Family History  Problem Relation Age of Onset  . Breast cancer Neg Hx        ADVANCED DIRECTIVES:    HEALTH MAINTENANCE: Social History   Tobacco Use  . Smoking status: Former Smoker    Packs/day: 2.00    Years: 30.00    Pack years:   60.00    Last attempt to quit: 02/01/2000    Years since quitting: 18.0  . Smokeless tobacco: Never Used  . Tobacco comment: 07/18/2017 former smoker quit 1999  Substance Use Topics  . Alcohol use: No    Alcohol/week: 0.0 oz  . Drug use: No     Colonoscopy:  PAP:  Bone density:  Lipid panel:  Allergies  Allergen Reactions  . Metformin And Related Diarrhea  .  Amoxicillin Other (See Comments)    Reaction: Yeast infection  . Avelox [Moxifloxacin] Other (See Comments)    Reaction: Muscle pain  . Benazepril Cough  . Bupropion     Other reaction(s): Dizziness, Headache  . Byetta 10 Mcg Pen [Exenatide] Diarrhea  . Metformin Diarrhea  . Neurontin [Gabapentin] Other (See Comments)    Mouth blisters, joint pain, depression Reaction: Mouth blister, joint pain and depression  . Statins Other (See Comments)    Reaction: Mouth blisters and joint pain  . Sulfa Antibiotics Other (See Comments)    Reaction: Headache  . Ceclor [Cefaclor] Rash    Current Outpatient Medications  Medication Sig Dispense Refill  . albuterol (PROVENTIL HFA;VENTOLIN HFA) 108 (90 BASE) MCG/ACT inhaler Inhale 2 puffs into the lungs every 4 (four) hours as needed for wheezing or shortness of breath.    . ALPRAZolam (XANAX) 0.5 MG tablet Take 0.5 mg by mouth 2 (two) times daily as needed for anxiety.    Marland Kitchen aspirin 81 MG chewable tablet Chew 1 tablet (81 mg total) by mouth daily.    . clopidogrel (PLAVIX) 75 MG tablet Take 75 mg by mouth daily.    Marland Kitchen glipiZIDE (GLUCOTROL) 10 MG tablet Take 10 mg by mouth daily before breakfast.    . insulin NPH-regular Human (NOVOLIN 70/30) (70-30) 100 UNIT/ML injection Inject 15-25 Units into the skin 2 (two) times daily with a meal.     . isosorbide mononitrate (IMDUR) 30 MG 24 hr tablet Take 1 tablet (30 mg total) by mouth daily. 30 tablet 0  . loratadine (CLARITIN) 10 MG tablet Take 10 mg by mouth daily as needed.     Marland Kitchen losartan (COZAAR) 50 MG tablet Take 50 mg by mouth every morning.     . metoprolol succinate (TOPROL-XL) 25 MG 24 hr tablet     . nitroGLYCERIN (NITROSTAT) 0.4 MG SL tablet Place 1 tablet (0.4 mg total) under the tongue every 5 (five) minutes as needed for chest pain. 30 tablet 0  . omeprazole (PRILOSEC) 20 MG capsule Take 20 mg by mouth 2 (two) times daily.     Marland Kitchen PARoxetine (PAXIL) 40 MG tablet Take 20 mg by mouth 2 (two) times  daily.     . rosuvastatin (CRESTOR) 10 MG tablet Take 10 mg by mouth daily.    . traMADol (ULTRAM) 50 MG tablet Take 50 mg by mouth every 6 (six) hours as needed for moderate pain.     . valACYclovir (VALTREX) 500 MG tablet TAKE ONE CAPLET BY MOUTH TWICE DAILY FOR 3 DAYS AS NEEDED FOR  FLARE     No current facility-administered medications for this visit.     OBJECTIVE: Vitals:   01/30/18 0945 01/30/18 0957  BP:  (!) 100/58  Pulse:  71  Resp: 16   Temp:  98.2 F (36.8 C)     Body mass index is 36.61 kg/m.    ECOG FS:0 - Asymptomatic  General: Well-developed, well-nourished, no acute distress. Eyes: Pink conjunctiva, anicteric sclera. Breast: Bilateral breast and axilla without  lumps or masses. Lungs: Clear to auscultation bilaterally. Heart: Regular rate and rhythm. No rubs, murmurs, or gallops. Abdomen: Soft, nontender, nondistended. No organomegaly noted, normoactive bowel sounds. Musculoskeletal: No edema, cyanosis, or clubbing. Neuro: Alert, answering all questions appropriately. Cranial nerves grossly intact. Skin: No rashes or petechiae noted. Psych: Normal affect.  LAB RESULTS:  Lab Results  Component Value Date   NA 140 07/06/2017   K 4.2 07/06/2017   CL 105 07/06/2017   CO2 28 07/06/2017   GLUCOSE 151 (H) 07/06/2017   BUN 19 07/06/2017   CREATININE 1.00 07/06/2017   CALCIUM 8.8 (L) 07/06/2017   PROT 6.7 02/15/2015   ALBUMIN 3.4 (L) 02/15/2015   AST 47 (H) 02/15/2015   ALT 310 (H) 02/15/2015   ALKPHOS 109 02/15/2015   BILITOT 0.7 02/15/2015   GFRNONAA 55 (L) 07/06/2017   GFRAA >60 07/06/2017    Lab Results  Component Value Date   WBC 6.8 01/30/2018   NEUTROABS 4.1 01/30/2018   HGB 12.8 01/30/2018   HCT 37.4 01/30/2018   MCV 90.8 01/30/2018   PLT 191 01/30/2018     STUDIES: No results found.  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  PLAN:    1. 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  She completed XRT in approximately January 2015. Patient could not tolerate letrozole, anastrozole, or Aromasin. She elected to discontinue all aromatase inhibitors despite knowing her risk of recurrence increases.  Patient's most recent mammogram on July 08, 2017 was reported as BI-RADS 2.  Repeat in October 2019.  Return to clinic in 1 year for further evaluation.  Approximately 20 minutes was spent in discussion of which greater than 50% was consultation.  Patient expressed understanding and was in agreement with this plan. She also understands that She can call clinic at any time with any questions, concerns, or complaints.    Lloyd Huger, MD   02/04/2018 1:31 PM

## 2018-01-29 ENCOUNTER — Other Ambulatory Visit: Payer: Self-pay | Admitting: *Deleted

## 2018-01-29 DIAGNOSIS — D649 Anemia, unspecified: Secondary | ICD-10-CM

## 2018-01-29 DIAGNOSIS — C50919 Malignant neoplasm of unspecified site of unspecified female breast: Secondary | ICD-10-CM

## 2018-01-29 NOTE — Progress Notes (Signed)
cb

## 2018-01-30 ENCOUNTER — Inpatient Hospital Stay (HOSPITAL_BASED_OUTPATIENT_CLINIC_OR_DEPARTMENT_OTHER): Payer: Medicare HMO | Admitting: Oncology

## 2018-01-30 ENCOUNTER — Encounter: Payer: Self-pay | Admitting: Oncology

## 2018-01-30 ENCOUNTER — Inpatient Hospital Stay: Payer: Medicare HMO | Attending: Oncology

## 2018-01-30 ENCOUNTER — Other Ambulatory Visit: Payer: Self-pay

## 2018-01-30 VITALS — BP 100/58 | HR 71 | Temp 98.2°F | Resp 16 | Ht 64.0 in | Wt 213.3 lb

## 2018-01-30 DIAGNOSIS — Z853 Personal history of malignant neoplasm of breast: Secondary | ICD-10-CM | POA: Insufficient documentation

## 2018-01-30 DIAGNOSIS — J449 Chronic obstructive pulmonary disease, unspecified: Secondary | ICD-10-CM

## 2018-01-30 DIAGNOSIS — C50211 Malignant neoplasm of upper-inner quadrant of right female breast: Secondary | ICD-10-CM

## 2018-01-30 DIAGNOSIS — Z87891 Personal history of nicotine dependence: Secondary | ICD-10-CM

## 2018-01-30 DIAGNOSIS — D649 Anemia, unspecified: Secondary | ICD-10-CM

## 2018-01-30 DIAGNOSIS — C50919 Malignant neoplasm of unspecified site of unspecified female breast: Secondary | ICD-10-CM

## 2018-01-30 LAB — CBC WITH DIFFERENTIAL/PLATELET
Basophils Absolute: 0.1 10*3/uL (ref 0–0.1)
Basophils Relative: 1 %
EOS ABS: 0.2 10*3/uL (ref 0–0.7)
Eosinophils Relative: 2 %
HCT: 37.4 % (ref 35.0–47.0)
HEMOGLOBIN: 12.8 g/dL (ref 12.0–16.0)
LYMPHS ABS: 1.9 10*3/uL (ref 1.0–3.6)
Lymphocytes Relative: 28 %
MCH: 31 pg (ref 26.0–34.0)
MCHC: 34.2 g/dL (ref 32.0–36.0)
MCV: 90.8 fL (ref 80.0–100.0)
Monocytes Absolute: 0.6 10*3/uL (ref 0.2–0.9)
Monocytes Relative: 9 %
NEUTROS PCT: 60 %
Neutro Abs: 4.1 10*3/uL (ref 1.4–6.5)
Platelets: 191 10*3/uL (ref 150–440)
RBC: 4.12 MIL/uL (ref 3.80–5.20)
RDW: 13.7 % (ref 11.5–14.5)
WBC: 6.8 10*3/uL (ref 3.6–11.0)

## 2018-01-31 LAB — CANCER ANTIGEN 27.29: CAN 27.29: 5.8 U/mL (ref 0.0–38.6)

## 2018-03-25 ENCOUNTER — Encounter: Payer: Self-pay | Admitting: *Deleted

## 2018-03-25 ENCOUNTER — Other Ambulatory Visit: Payer: Self-pay

## 2018-03-26 NOTE — Discharge Instructions (Signed)

## 2018-04-01 ENCOUNTER — Ambulatory Visit
Admission: RE | Admit: 2018-04-01 | Discharge: 2018-04-01 | Disposition: A | Discharge status: Home / Self Care | Payer: Medicare HMO | Source: Ambulatory Visit | Attending: Ophthalmology | Admitting: Ophthalmology

## 2018-04-01 ENCOUNTER — Ambulatory Visit: Payer: Medicare HMO | Admitting: Anesthesiology

## 2018-04-01 ENCOUNTER — Encounter
Admission: RE | Disposition: A | Discharge status: Home / Self Care | Payer: Self-pay | Source: Ambulatory Visit | Attending: Ophthalmology

## 2018-04-01 DIAGNOSIS — H2512 Age-related nuclear cataract, left eye: Secondary | ICD-10-CM | POA: Insufficient documentation

## 2018-04-01 DIAGNOSIS — I2510 Atherosclerotic heart disease of native coronary artery without angina pectoris: Secondary | ICD-10-CM | POA: Diagnosis not present

## 2018-04-01 DIAGNOSIS — I509 Heart failure, unspecified: Secondary | ICD-10-CM | POA: Diagnosis not present

## 2018-04-01 DIAGNOSIS — Z955 Presence of coronary angioplasty implant and graft: Secondary | ICD-10-CM | POA: Insufficient documentation

## 2018-04-01 DIAGNOSIS — Z87891 Personal history of nicotine dependence: Secondary | ICD-10-CM | POA: Insufficient documentation

## 2018-04-01 DIAGNOSIS — I110 Hypertensive heart disease with heart failure: Secondary | ICD-10-CM | POA: Insufficient documentation

## 2018-04-01 DIAGNOSIS — G4730 Sleep apnea, unspecified: Secondary | ICD-10-CM | POA: Diagnosis not present

## 2018-04-01 DIAGNOSIS — Z853 Personal history of malignant neoplasm of breast: Secondary | ICD-10-CM | POA: Diagnosis not present

## 2018-04-01 DIAGNOSIS — J449 Chronic obstructive pulmonary disease, unspecified: Secondary | ICD-10-CM | POA: Diagnosis not present

## 2018-04-01 DIAGNOSIS — E1136 Type 2 diabetes mellitus with diabetic cataract: Secondary | ICD-10-CM | POA: Diagnosis present

## 2018-04-01 HISTORY — PX: CATARACT EXTRACTION W/PHACO: SHX586

## 2018-04-01 HISTORY — DX: Sleep apnea, unspecified: G47.30

## 2018-04-01 HISTORY — DX: Adverse effect of unspecified anesthetic, initial encounter: T41.45XA

## 2018-04-01 HISTORY — DX: Dizziness and giddiness: R42

## 2018-04-01 HISTORY — DX: Fatty (change of) liver, not elsewhere classified: K76.0

## 2018-04-01 HISTORY — DX: Other complications of anesthesia, initial encounter: T88.59XA

## 2018-04-01 LAB — GLUCOSE, CAPILLARY
GLUCOSE-CAPILLARY: 183 mg/dL — AB (ref 70–99)
Glucose-Capillary: 198 mg/dL — ABNORMAL HIGH (ref 70–99)

## 2018-04-01 SURGERY — PHACOEMULSIFICATION, CATARACT, WITH IOL INSERTION
Anesthesia: Monitor Anesthesia Care | Site: Eye | Laterality: Left | Wound class: "Clean "

## 2018-04-01 MED ORDER — BRIMONIDINE TARTRATE-TIMOLOL 0.2-0.5 % OP SOLN
OPHTHALMIC | Status: DC | PRN
  Administered 2018-04-01: 1 [drp] via OPHTHALMIC

## 2018-04-01 MED ORDER — LACTATED RINGERS IV SOLN: INTRAVENOUS | Status: DC

## 2018-04-01 MED ORDER — NA HYALUR & NA CHOND-NA HYALUR 0.4-0.35 ML IO KIT
PACK | INTRAOCULAR | Status: DC | PRN
  Administered 2018-04-01: 1 mL via INTRAOCULAR

## 2018-04-01 MED ORDER — EPINEPHRINE PF 1 MG/ML IJ SOLN
INTRAOCULAR | Status: DC | PRN
  Administered 2018-04-01: 64 mL via OPHTHALMIC

## 2018-04-01 MED ORDER — CEFUROXIME OPHTHALMIC INJECTION 1 MG/0.1 ML
INJECTION | OPHTHALMIC | Status: DC | PRN
  Administered 2018-04-01: 0.1 mL via OPHTHALMIC

## 2018-04-01 MED ORDER — CYCLOPENTOLATE HCL 2 % OP SOLN
1.0000 [drp] | OPHTHALMIC | Status: DC | PRN
  Administered 2018-04-01 (×3): 1 [drp] via OPHTHALMIC

## 2018-04-01 MED ORDER — MOXIFLOXACIN HCL 0.5 % OP SOLN
1.0000 [drp] | OPHTHALMIC | Status: DC | PRN
  Administered 2018-04-01 (×3): 1 [drp] via OPHTHALMIC

## 2018-04-01 MED ORDER — FENTANYL CITRATE (PF) 100 MCG/2ML IJ SOLN
INTRAMUSCULAR | Status: DC | PRN
  Administered 2018-04-01: 50 ug via INTRAVENOUS

## 2018-04-01 MED ORDER — TETRACAINE HCL 0.5 % OP SOLN
1.0000 [drp] | OPHTHALMIC | Status: DC | PRN
  Administered 2018-04-01 (×2): 1 [drp] via OPHTHALMIC

## 2018-04-01 MED ORDER — LACTATED RINGERS IV SOLN: 500.0000 mL | INTRAVENOUS | Status: DC

## 2018-04-01 MED ORDER — ACETAMINOPHEN 160 MG/5ML PO SOLN: 325.0000 mg | Freq: Once | ORAL | Status: DC

## 2018-04-01 MED ORDER — ACETAMINOPHEN 325 MG PO TABS: 325.0000 mg | ORAL_TABLET | Freq: Once | ORAL | Status: DC

## 2018-04-01 MED ORDER — PHENYLEPHRINE HCL 10 % OP SOLN
1.0000 [drp] | OPHTHALMIC | Status: DC | PRN
  Administered 2018-04-01 (×3): 1 [drp] via OPHTHALMIC

## 2018-04-01 MED ORDER — MIDAZOLAM HCL 2 MG/2ML IJ SOLN
INTRAMUSCULAR | Status: DC | PRN
  Administered 2018-04-01: 2 mg via INTRAVENOUS

## 2018-04-01 MED ORDER — LIDOCAINE HCL (PF) 2 % IJ SOLN
INTRAOCULAR | Status: DC | PRN
  Administered 2018-04-01: 1 mL

## 2018-04-01 SURGICAL SUPPLY — 27 items
CANNULA ANT/CHMB 27G (MISCELLANEOUS) ×1 IMPLANT
CANNULA ANT/CHMB 27GA (MISCELLANEOUS) ×3 IMPLANT
CARTRIDGE ABBOTT (MISCELLANEOUS) IMPLANT
GLOVE SURG LX 7.5 STRW (GLOVE) ×2
GLOVE SURG LX STRL 7.5 STRW (GLOVE) ×1 IMPLANT
GLOVE SURG TRIUMPH 8.0 PF LTX (GLOVE) ×3 IMPLANT
GOWN STRL REUS W/ TWL LRG LVL3 (GOWN DISPOSABLE) ×2 IMPLANT
GOWN STRL REUS W/TWL LRG LVL3 (GOWN DISPOSABLE) ×4
LENS IOL ACRYSOF IQ 20.5 (Intraocular Lens) ×2 IMPLANT
MARKER SKIN DUAL TIP RULER LAB (MISCELLANEOUS) ×3 IMPLANT
NDL FILTER BLUNT 18X1 1/2 (NEEDLE) ×1 IMPLANT
NDL RETROBULBAR .5 NSTRL (NEEDLE) IMPLANT
NEEDLE FILTER BLUNT 18X 1/2SAF (NEEDLE) ×2
NEEDLE FILTER BLUNT 18X1 1/2 (NEEDLE) ×1 IMPLANT
PACK CATARACT BRASINGTON (MISCELLANEOUS) ×3 IMPLANT
PACK EYE AFTER SURG (MISCELLANEOUS) ×3 IMPLANT
PACK OPTHALMIC (MISCELLANEOUS) ×3 IMPLANT
RING MALYGIN 7.0 (MISCELLANEOUS) IMPLANT
SUT ETHILON 10-0 CS-B-6CS-B-6 (SUTURE)
SUT VICRYL  9 0 (SUTURE)
SUT VICRYL 9 0 (SUTURE) IMPLANT
SUTURE EHLN 10-0 CS-B-6CS-B-6 (SUTURE) IMPLANT
SYR 3ML LL SCALE MARK (SYRINGE) ×3 IMPLANT
SYR 5ML LL (SYRINGE) ×3 IMPLANT
SYR TB 1ML LUER SLIP (SYRINGE) ×3 IMPLANT
WATER STERILE IRR 500ML POUR (IV SOLUTION) ×3 IMPLANT
WIPE NON LINTING 3.25X3.25 (MISCELLANEOUS) ×3 IMPLANT

## 2018-04-01 NOTE — Anesthesia Procedure Notes (Signed)
Procedure Name: MAC Performed by: Tommie Dejoseph, CRNA Pre-anesthesia Checklist: Patient identified, Emergency Drugs available, Suction available, Timeout performed and Patient being monitored Patient Re-evaluated:Patient Re-evaluated prior to induction Oxygen Delivery Method: Nasal cannula Placement Confirmation: positive ETCO2       

## 2018-04-01 NOTE — Op Note (Signed)
OPERATIVE NOTE  Tammy Hatfield [id number] 04/01/2018   PREOPERATIVE DIAGNOSIS:  Nuclear sclerotic cataract left eye. H25.12   POSTOPERATIVE DIAGNOSIS:    Nuclear sclerotic cataract left eye.     PROCEDURE:  Phacoemusification with posterior chamber intraocular lens placement of the left eye   LENS:   Implant Name Type Inv. Item Serial No. Manufacturer Lot No. LRB No. Used  LENS IOL ACRYSOF IQ 20.5 - [id number] Intraocular Lens LENS IOL ACRYSOF IQ 20.5 [id number] [employer]CON  Left 1        ULTRASOUND TIME: 17  % of 1 minutes 14 seconds, CDE 12.5  SURGEON:  Wyonia Hough, MD   ANESTHESIA:  Topical with tetracaine drops and 2% Xylocaine jelly, augmented with 1% preservative-free intracameral lidocaine.    COMPLICATIONS:  None.   DESCRIPTION OF PROCEDURE:  The patient was identified in the holding room and transported to the operating room and placed in the supine position under the operating microscope.  The left eye was identified as the operative eye and it was prepped and draped in the usual sterile ophthalmic fashion.   A 1 millimeter clear-corneal paracentesis was made at the 1:30 position.  0.5 ml of preservative-free 1% lidocaine was injected into the anterior chamber.  The anterior chamber was filled with Viscoat viscoelastic.  A 2.4 millimeter keratome was used to make a near-clear corneal incision at the 10:30 position.  .  A curvilinear capsulorrhexis was made with a cystotome and capsulorrhexis forceps.  Balanced salt solution was used to hydrodissect and hydrodelineate the nucleus.   Phacoemulsification was then used in stop and chop fashion to remove the lens nucleus and epinucleus.  The remaining cortex was then removed using the irrigation and aspiration handpiece. Provisc was then placed into the capsular bag to distend it for lens placement.  A lens was then injected into the capsular bag.  The remaining viscoelastic was aspirated.   Wounds were hydrated with  balanced salt solution.  The anterior chamber was inflated to a physiologic pressure with balanced salt solution.  No wound leaks were noted. Cefuroxime 0.1 ml of a 10mg /ml solution was injected into the anterior chamber for a dose of 1 mg of intracameral antibiotic at the completion of the case.   Timolol and Brimonidine drops were applied to the eye.  The patient was taken to the recovery room in stable condition without complications of anesthesia or surgery.  Tammy Hatfield 04/01/2018, 8:02 AM

## 2018-04-01 NOTE — Transfer of Care (Signed)
Immediate Anesthesia Transfer of Care Note  Patient: Tammy Hatfield  Procedure(s) Performed: CATARACT EXTRACTION PHACO AND INTRAOCULAR LENS PLACEMENT (IOC) LEFT DIABETES IVA TOPICAL (Left Eye)  Patient Location: PACU  Anesthesia Type: MAC  Level of Consciousness: awake, alert  and patient cooperative  Airway and Oxygen Therapy: Patient Spontanous Breathing and Patient connected to supplemental oxygen  Post-op Assessment: Post-op Vital signs reviewed, Patient's Cardiovascular Status Stable, Respiratory Function Stable, Patent Airway and No signs of Nausea or vomiting  Post-op Vital Signs: Reviewed and stable  Complications: No apparent anesthesia complications

## 2018-04-01 NOTE — Anesthesia Preprocedure Evaluation (Signed)
Anesthesia Evaluation  Patient identified by MRN, date of birth, ID band Patient awake    Reviewed: Allergy & Precautions, H&P , NPO status , Patient's Chart, lab work & pertinent test results  Airway Mallampati: II  TM Distance: >3 FB Neck ROM: full    Dental no notable dental hx.    Pulmonary sleep apnea , COPD, former smoker,    Pulmonary exam normal breath sounds clear to auscultation       Cardiovascular hypertension, + CAD, + Cardiac Stents and +CHF  Normal cardiovascular exam Rhythm:regular Rate:Normal     Neuro/Psych    GI/Hepatic GERD  ,  Endo/Other  diabetes  Renal/GU      Musculoskeletal   Abdominal   Peds  Hematology   Anesthesia Other Findings   Reproductive/Obstetrics                             Anesthesia Physical  Anesthesia Plan  ASA: III  Anesthesia Plan: MAC   Post-op Pain Management:    Induction:   PONV Risk Score and Plan: 2 and Treatment may vary due to age or medical condition and Midazolam  Airway Management Planned:   Additional Equipment:   Intra-op Plan:   Post-operative Plan:   Informed Consent: I have reviewed the patients History and Physical, chart, labs and discussed the procedure including the risks, benefits and alternatives for the proposed anesthesia with the patient or authorized representative who has indicated his/her understanding and acceptance.     Plan Discussed with: CRNA  Anesthesia Plan Comments:         Anesthesia Quick Evaluation  

## 2018-04-01 NOTE — H&P (Signed)
The History and Physical notes are on paper, have been signed, and are to be scanned. The patient remains stable and unchanged from the H&P.   Previous H&P reviewed, patient examined, and there are no changes.  Tammy Hatfield 04/01/2018 7:29 AM

## 2018-04-01 NOTE — Anesthesia Postprocedure Evaluation (Signed)
Anesthesia Post Note  Patient: Tammy Hatfield  Procedure(s) Performed: CATARACT EXTRACTION PHACO AND INTRAOCULAR LENS PLACEMENT (IOC) LEFT DIABETES IVA TOPICAL (Left Eye)  Patient location during evaluation: PACU Anesthesia Type: MAC Level of consciousness: awake and alert and oriented Pain management: satisfactory to patient Vital Signs Assessment: post-procedure vital signs reviewed and stable Respiratory status: spontaneous breathing, nonlabored ventilation and respiratory function stable Cardiovascular status: blood pressure returned to baseline and stable Postop Assessment: Adequate PO intake and No signs of nausea or vomiting Anesthetic complications: no    Raliegh Ip

## 2018-04-02 ENCOUNTER — Encounter: Payer: Self-pay | Admitting: Ophthalmology

## 2018-04-23 ENCOUNTER — Other Ambulatory Visit: Payer: Self-pay

## 2018-04-23 ENCOUNTER — Encounter: Payer: Self-pay | Admitting: *Deleted

## 2018-04-24 NOTE — Discharge Instructions (Signed)

## 2018-04-30 ENCOUNTER — Ambulatory Visit
Admission: RE | Admit: 2018-04-30 | Discharge: 2018-04-30 | Disposition: A | Discharge status: Home / Self Care | Payer: Medicare HMO | Source: Ambulatory Visit | Attending: Ophthalmology | Admitting: Ophthalmology

## 2018-04-30 ENCOUNTER — Ambulatory Visit: Payer: Medicare HMO | Admitting: Anesthesiology

## 2018-04-30 ENCOUNTER — Encounter
Admission: RE | Disposition: A | Discharge status: Home / Self Care | Payer: Self-pay | Source: Ambulatory Visit | Attending: Ophthalmology

## 2018-04-30 DIAGNOSIS — Z955 Presence of coronary angioplasty implant and graft: Secondary | ICD-10-CM | POA: Diagnosis not present

## 2018-04-30 DIAGNOSIS — I509 Heart failure, unspecified: Secondary | ICD-10-CM | POA: Insufficient documentation

## 2018-04-30 DIAGNOSIS — K219 Gastro-esophageal reflux disease without esophagitis: Secondary | ICD-10-CM | POA: Insufficient documentation

## 2018-04-30 DIAGNOSIS — J449 Chronic obstructive pulmonary disease, unspecified: Secondary | ICD-10-CM | POA: Diagnosis not present

## 2018-04-30 DIAGNOSIS — Z7982 Long term (current) use of aspirin: Secondary | ICD-10-CM | POA: Diagnosis not present

## 2018-04-30 DIAGNOSIS — Z853 Personal history of malignant neoplasm of breast: Secondary | ICD-10-CM | POA: Diagnosis not present

## 2018-04-30 DIAGNOSIS — I110 Hypertensive heart disease with heart failure: Secondary | ICD-10-CM | POA: Insufficient documentation

## 2018-04-30 DIAGNOSIS — Z888 Allergy status to other drugs, medicaments and biological substances status: Secondary | ICD-10-CM | POA: Diagnosis not present

## 2018-04-30 DIAGNOSIS — G4730 Sleep apnea, unspecified: Secondary | ICD-10-CM | POA: Insufficient documentation

## 2018-04-30 DIAGNOSIS — Z882 Allergy status to sulfonamides status: Secondary | ICD-10-CM | POA: Insufficient documentation

## 2018-04-30 DIAGNOSIS — Z880 Allergy status to penicillin: Secondary | ICD-10-CM | POA: Insufficient documentation

## 2018-04-30 DIAGNOSIS — M1990 Unspecified osteoarthritis, unspecified site: Secondary | ICD-10-CM | POA: Diagnosis not present

## 2018-04-30 DIAGNOSIS — E1136 Type 2 diabetes mellitus with diabetic cataract: Secondary | ICD-10-CM | POA: Insufficient documentation

## 2018-04-30 DIAGNOSIS — H2511 Age-related nuclear cataract, right eye: Secondary | ICD-10-CM | POA: Diagnosis not present

## 2018-04-30 DIAGNOSIS — E7800 Pure hypercholesterolemia, unspecified: Secondary | ICD-10-CM | POA: Insufficient documentation

## 2018-04-30 DIAGNOSIS — Z79899 Other long term (current) drug therapy: Secondary | ICD-10-CM | POA: Insufficient documentation

## 2018-04-30 DIAGNOSIS — Z87891 Personal history of nicotine dependence: Secondary | ICD-10-CM | POA: Diagnosis not present

## 2018-04-30 DIAGNOSIS — Z794 Long term (current) use of insulin: Secondary | ICD-10-CM | POA: Diagnosis not present

## 2018-04-30 DIAGNOSIS — F329 Major depressive disorder, single episode, unspecified: Secondary | ICD-10-CM | POA: Diagnosis not present

## 2018-04-30 DIAGNOSIS — I2510 Atherosclerotic heart disease of native coronary artery without angina pectoris: Secondary | ICD-10-CM | POA: Insufficient documentation

## 2018-04-30 HISTORY — PX: CATARACT EXTRACTION W/PHACO: SHX586

## 2018-04-30 LAB — GLUCOSE, CAPILLARY
GLUCOSE-CAPILLARY: 179 mg/dL — AB (ref 70–99)
Glucose-Capillary: 162 mg/dL — ABNORMAL HIGH (ref 70–99)

## 2018-04-30 SURGERY — PHACOEMULSIFICATION, CATARACT, WITH IOL INSERTION
Anesthesia: Monitor Anesthesia Care | Site: Eye | Laterality: Right | Wound class: "Clean "

## 2018-04-30 MED ORDER — BRIMONIDINE TARTRATE-TIMOLOL 0.2-0.5 % OP SOLN
OPHTHALMIC | Status: DC | PRN
  Administered 2018-04-30: 1 [drp] via OPHTHALMIC

## 2018-04-30 MED ORDER — LIDOCAINE HCL (PF) 2 % IJ SOLN
INTRAOCULAR | Status: DC | PRN
  Administered 2018-04-30: 1 mL via INTRAMUSCULAR

## 2018-04-30 MED ORDER — ARMC OPHTHALMIC DILATING DROPS
1.0000 "application " | OPHTHALMIC | Status: DC | PRN
  Administered 2018-04-30 (×3): 1 via OPHTHALMIC

## 2018-04-30 MED ORDER — MOXIFLOXACIN HCL 0.5 % OP SOLN
1.0000 [drp] | OPHTHALMIC | Status: DC | PRN
  Administered 2018-04-30 (×3): 1 [drp] via OPHTHALMIC

## 2018-04-30 MED ORDER — ACETAMINOPHEN 160 MG/5ML PO SOLN: 325.0000 mg | Freq: Once | ORAL | Status: DC

## 2018-04-30 MED ORDER — ACETAMINOPHEN 325 MG PO TABS: 325.0000 mg | ORAL_TABLET | Freq: Once | ORAL | Status: DC

## 2018-04-30 MED ORDER — CEFUROXIME OPHTHALMIC INJECTION 1 MG/0.1 ML
INJECTION | OPHTHALMIC | Status: DC | PRN
  Administered 2018-04-30: 0.1 mL via INTRACAMERAL

## 2018-04-30 MED ORDER — LACTATED RINGERS IV SOLN: INTRAVENOUS | Status: DC

## 2018-04-30 MED ORDER — EPINEPHRINE PF 1 MG/ML IJ SOLN
INTRAOCULAR | Status: DC | PRN
  Administered 2018-04-30: 66 mL via OPHTHALMIC

## 2018-04-30 MED ORDER — MIDAZOLAM HCL 2 MG/2ML IJ SOLN
INTRAMUSCULAR | Status: DC | PRN
  Administered 2018-04-30: 2 mg via INTRAVENOUS

## 2018-04-30 MED ORDER — FENTANYL CITRATE (PF) 100 MCG/2ML IJ SOLN
INTRAMUSCULAR | Status: DC | PRN
  Administered 2018-04-30: 50 ug via INTRAVENOUS

## 2018-04-30 MED ORDER — NA HYALUR & NA CHOND-NA HYALUR 0.4-0.35 ML IO KIT
PACK | INTRAOCULAR | Status: DC | PRN
  Administered 2018-04-30: 1 mL via INTRAOCULAR

## 2018-04-30 SURGICAL SUPPLY — 27 items
CANNULA ANT/CHMB 27G (MISCELLANEOUS) ×1 IMPLANT
CANNULA ANT/CHMB 27GA (MISCELLANEOUS) ×3 IMPLANT
CARTRIDGE ABBOTT (MISCELLANEOUS) IMPLANT
GLOVE SURG LX 7.5 STRW (GLOVE) ×2
GLOVE SURG LX STRL 7.5 STRW (GLOVE) ×1 IMPLANT
GLOVE SURG TRIUMPH 8.0 PF LTX (GLOVE) ×3 IMPLANT
GOWN STRL REUS W/ TWL LRG LVL3 (GOWN DISPOSABLE) ×2 IMPLANT
GOWN STRL REUS W/TWL LRG LVL3 (GOWN DISPOSABLE) ×4
LENS IOL ACRYSOF IQ 20.0 (Intraocular Lens) ×2 IMPLANT
MARKER SKIN DUAL TIP RULER LAB (MISCELLANEOUS) ×3 IMPLANT
NDL FILTER BLUNT 18X1 1/2 (NEEDLE) ×1 IMPLANT
NDL RETROBULBAR .5 NSTRL (NEEDLE) IMPLANT
NEEDLE FILTER BLUNT 18X 1/2SAF (NEEDLE) ×2
NEEDLE FILTER BLUNT 18X1 1/2 (NEEDLE) ×1 IMPLANT
PACK CATARACT BRASINGTON (MISCELLANEOUS) ×3 IMPLANT
PACK EYE AFTER SURG (MISCELLANEOUS) ×3 IMPLANT
PACK OPTHALMIC (MISCELLANEOUS) ×3 IMPLANT
RING MALYGIN 7.0 (MISCELLANEOUS) IMPLANT
SUT ETHILON 10-0 CS-B-6CS-B-6 (SUTURE)
SUT VICRYL  9 0 (SUTURE)
SUT VICRYL 9 0 (SUTURE) IMPLANT
SUTURE EHLN 10-0 CS-B-6CS-B-6 (SUTURE) IMPLANT
SYR 3ML LL SCALE MARK (SYRINGE) ×3 IMPLANT
SYR 5ML LL (SYRINGE) ×3 IMPLANT
SYR TB 1ML LUER SLIP (SYRINGE) ×3 IMPLANT
WATER STERILE IRR 500ML POUR (IV SOLUTION) ×3 IMPLANT
WIPE NON LINTING 3.25X3.25 (MISCELLANEOUS) ×3 IMPLANT

## 2018-04-30 NOTE — Op Note (Signed)
LOCATION:  [place]   PREOPERATIVE DIAGNOSIS:    Nuclear sclerotic cataract right eye. H25.11   POSTOPERATIVE DIAGNOSIS:  Nuclear sclerotic cataract right eye.     PROCEDURE:  Phacoemusification with posterior chamber intraocular lens placement of the right eye   LENS:   Implant Name Type Inv. Item Serial No. Manufacturer Lot No. LRB No. Used  LENS IOL ACRYSOF IQ 20.0 - [id number] Intraocular Lens LENS IOL ACRYSOF IQ 20.0 [id number] [employer]  Right 1        ULTRASOUND TIME: 16 % of 1 minutes, 29 seconds.  CDE 13.9   SURGEON:  Wyonia Hough, MD   ANESTHESIA:  Topical with tetracaine drops and 2% Xylocaine jelly, augmented with 1% preservative-free intracameral lidocaine.    COMPLICATIONS:  None.   DESCRIPTION OF PROCEDURE:  The patient was identified in the holding room and transported to the operating room and placed in the supine position under the operating microscope.  The right eye was identified as the operative eye and it was prepped and draped in the usual sterile ophthalmic fashion.   A 1 millimeter clear-corneal paracentesis was made at the 12:00 position.  0.5 ml of preservative-free 1% lidocaine was injected into the anterior chamber. The anterior chamber was filled with Viscoat viscoelastic.  A 2.4 millimeter keratome was used to make a near-clear corneal incision at the 9:00 position.  A curvilinear capsulorrhexis was made with a cystotome and capsulorrhexis forceps.  Balanced salt solution was used to hydrodissect and hydrodelineate the nucleus.   Phacoemulsification was then used in stop and chop fashion to remove the lens nucleus and epinucleus.  The remaining cortex was then removed using the irrigation and aspiration handpiece. Provisc was then placed into the capsular bag to distend it for lens placement.  A lens was then injected into the capsular bag.  The remaining viscoelastic was aspirated.   Wounds were hydrated with balanced salt solution.   The anterior chamber was inflated to a physiologic pressure with balanced salt solution.  No wound leaks were noted. Cefuroxime 0.1 ml of a 10mg /ml solution was injected into the anterior chamber for a dose of 1 mg of intracameral antibiotic at the completion of the case.   Timolol and Brimonidine drops were applied to the eye.  The patient was taken to the recovery room in stable condition without complications of anesthesia or surgery.   Nichelle Renwick 04/30/2018, 9:01 AM

## 2018-04-30 NOTE — Anesthesia Preprocedure Evaluation (Signed)
Anesthesia Evaluation  Patient identified by MRN, date of birth, ID band Patient awake    Reviewed: Allergy & Precautions, H&P , NPO status , Patient's Chart, lab work & pertinent test results  Airway Mallampati: II  TM Distance: >3 FB Neck ROM: full    Dental no notable dental hx.    Pulmonary sleep apnea , COPD, former smoker,    Pulmonary exam normal breath sounds clear to auscultation       Cardiovascular hypertension, + CAD, + Cardiac Stents and +CHF  Normal cardiovascular exam Rhythm:regular Rate:Normal     Neuro/Psych    GI/Hepatic GERD  ,  Endo/Other  diabetes  Renal/GU      Musculoskeletal   Abdominal   Peds  Hematology   Anesthesia Other Findings   Reproductive/Obstetrics                             Anesthesia Physical  Anesthesia Plan  ASA: III  Anesthesia Plan: MAC   Post-op Pain Management:    Induction:   PONV Risk Score and Plan: 2 and Treatment may vary due to age or medical condition and Midazolam  Airway Management Planned:   Additional Equipment:   Intra-op Plan:   Post-operative Plan:   Informed Consent: I have reviewed the patients History and Physical, chart, labs and discussed the procedure including the risks, benefits and alternatives for the proposed anesthesia with the patient or authorized representative who has indicated his/her understanding and acceptance.     Plan Discussed with: CRNA  Anesthesia Plan Comments:         Anesthesia Quick Evaluation

## 2018-04-30 NOTE — H&P (Signed)
The History and Physical notes are on paper, have been signed, and are to be scanned. The patient remains stable and unchanged from the H&P.   Previous H&P reviewed, patient examined, and there are no changes.  Tammy Hatfield 04/30/2018 8:10 AM

## 2018-04-30 NOTE — Anesthesia Postprocedure Evaluation (Signed)
Anesthesia Post Note  Patient: Tammy Hatfield  Procedure(s) Performed: CATARACT EXTRACTION PHACO AND INTRAOCULAR LENS PLACEMENT (IOC) RIGHT DIABETIC (Right Eye)  Patient location during evaluation: PACU Anesthesia Type: MAC Level of consciousness: awake and alert and oriented Pain management: satisfactory to patient Vital Signs Assessment: post-procedure vital signs reviewed and stable Respiratory status: spontaneous breathing, nonlabored ventilation and respiratory function stable Cardiovascular status: blood pressure returned to baseline and stable Postop Assessment: Adequate PO intake and No signs of nausea or vomiting Anesthetic complications: no    Raliegh Ip

## 2018-04-30 NOTE — Transfer of Care (Signed)
Immediate Anesthesia Transfer of Care Note  Patient: Tammy Hatfield  Procedure(s) Performed: CATARACT EXTRACTION PHACO AND INTRAOCULAR LENS PLACEMENT (IOC) RIGHT DIABETIC (Right Eye)  Patient Location: PACU  Anesthesia Type: MAC  Level of Consciousness: awake, alert  and patient cooperative  Airway and Oxygen Therapy: Patient Spontanous Breathing and Patient connected to supplemental oxygen  Post-op Assessment: Post-op Vital signs reviewed, Patient's Cardiovascular Status Stable, Respiratory Function Stable, Patent Airway and No signs of Nausea or vomiting  Post-op Vital Signs: Reviewed and stable  Complications: No apparent anesthesia complications

## 2018-04-30 NOTE — Anesthesia Procedure Notes (Signed)
Procedure Name: MAC Performed by: Lind Guest, CRNA Pre-anesthesia Checklist: Patient identified, Emergency Drugs available, Suction available, Timeout performed and Patient being monitored Patient Re-evaluated:Patient Re-evaluated prior to induction Oxygen Delivery Method: Nasal cannula Placement Confirmation: positive ETCO2

## 2018-05-01 ENCOUNTER — Encounter: Payer: Self-pay | Admitting: Ophthalmology

## 2018-06-12 DIAGNOSIS — E669 Obesity, unspecified: Secondary | ICD-10-CM | POA: Insufficient documentation

## 2018-07-08 DIAGNOSIS — E1165 Type 2 diabetes mellitus with hyperglycemia: Secondary | ICD-10-CM | POA: Insufficient documentation

## 2018-07-08 DIAGNOSIS — Z794 Long term (current) use of insulin: Secondary | ICD-10-CM | POA: Insufficient documentation

## 2018-07-09 ENCOUNTER — Ambulatory Visit
Admission: RE | Admit: 2018-07-09 | Discharge: 2018-07-09 | Disposition: A | Discharge status: Home / Self Care | Payer: Medicare HMO | Source: Ambulatory Visit | Attending: Oncology | Admitting: Oncology

## 2018-07-09 DIAGNOSIS — C50211 Malignant neoplasm of upper-inner quadrant of right female breast: Secondary | ICD-10-CM | POA: Diagnosis not present

## 2018-09-16 ENCOUNTER — Ambulatory Visit: Payer: Self-pay | Admitting: Cardiovascular Disease

## 2018-09-16 DIAGNOSIS — I200 Unstable angina: Secondary | ICD-10-CM | POA: Insufficient documentation

## 2018-09-18 ENCOUNTER — Ambulatory Visit
Admission: RE | Admit: 2018-09-18 | Discharge: 2018-09-18 | Disposition: A | Discharge status: Home / Self Care | Payer: Medicare HMO | Attending: Cardiovascular Disease | Admitting: Cardiovascular Disease

## 2018-09-18 ENCOUNTER — Encounter
Admission: RE | Disposition: A | Discharge status: Home / Self Care | Payer: Self-pay | Source: Home / Self Care | Attending: Cardiovascular Disease

## 2018-09-18 ENCOUNTER — Other Ambulatory Visit: Payer: Self-pay

## 2018-09-18 ENCOUNTER — Encounter: Payer: Self-pay | Admitting: *Deleted

## 2018-09-18 DIAGNOSIS — E785 Hyperlipidemia, unspecified: Secondary | ICD-10-CM | POA: Diagnosis not present

## 2018-09-18 DIAGNOSIS — Z955 Presence of coronary angioplasty implant and graft: Secondary | ICD-10-CM | POA: Insufficient documentation

## 2018-09-18 DIAGNOSIS — Z79899 Other long term (current) drug therapy: Secondary | ICD-10-CM | POA: Diagnosis not present

## 2018-09-18 DIAGNOSIS — Z7902 Long term (current) use of antithrombotics/antiplatelets: Secondary | ICD-10-CM | POA: Diagnosis not present

## 2018-09-18 DIAGNOSIS — E119 Type 2 diabetes mellitus without complications: Secondary | ICD-10-CM | POA: Insufficient documentation

## 2018-09-18 DIAGNOSIS — I10 Essential (primary) hypertension: Secondary | ICD-10-CM | POA: Diagnosis not present

## 2018-09-18 DIAGNOSIS — Z794 Long term (current) use of insulin: Secondary | ICD-10-CM | POA: Diagnosis not present

## 2018-09-18 DIAGNOSIS — Z87891 Personal history of nicotine dependence: Secondary | ICD-10-CM | POA: Insufficient documentation

## 2018-09-18 DIAGNOSIS — I25110 Atherosclerotic heart disease of native coronary artery with unstable angina pectoris: Secondary | ICD-10-CM | POA: Diagnosis not present

## 2018-09-18 DIAGNOSIS — Z8249 Family history of ischemic heart disease and other diseases of the circulatory system: Secondary | ICD-10-CM | POA: Diagnosis not present

## 2018-09-18 DIAGNOSIS — I200 Unstable angina: Secondary | ICD-10-CM | POA: Insufficient documentation

## 2018-09-18 HISTORY — DX: Pneumonia, unspecified organism: J18.9

## 2018-09-18 HISTORY — DX: Angina pectoris, unspecified: I20.9

## 2018-09-18 HISTORY — DX: Atherosclerotic heart disease of native coronary artery without angina pectoris: I25.10

## 2018-09-18 HISTORY — DX: Anxiety disorder, unspecified: F41.9

## 2018-09-18 HISTORY — PX: LEFT HEART CATH AND CORONARY ANGIOGRAPHY: CATH118249

## 2018-09-18 LAB — GLUCOSE, CAPILLARY: Glucose-Capillary: 192 mg/dL — ABNORMAL HIGH (ref 70–99)

## 2018-09-18 SURGERY — LEFT HEART CATH AND CORONARY ANGIOGRAPHY
Anesthesia: Moderate Sedation | Laterality: Left

## 2018-09-18 MED ORDER — SODIUM CHLORIDE 0.9 % IV SOLN: 250.0000 mL | INTRAVENOUS | Status: DC | PRN

## 2018-09-18 MED ORDER — HEPARIN (PORCINE) IN NACL 1000-0.9 UT/500ML-% IV SOLN
INTRAVENOUS | Status: AC
  Filled 2018-09-18: qty 1000

## 2018-09-18 MED ORDER — ACETAMINOPHEN 325 MG PO TABS: 650.0000 mg | ORAL_TABLET | ORAL | Status: DC | PRN

## 2018-09-18 MED ORDER — SODIUM CHLORIDE 0.9% FLUSH: 3.0000 mL | Freq: Two times a day (BID) | INTRAVENOUS | Status: DC

## 2018-09-18 MED ORDER — ASPIRIN 81 MG PO CHEW: 81.0000 mg | CHEWABLE_TABLET | ORAL | Status: DC

## 2018-09-18 MED ORDER — FENTANYL CITRATE (PF) 100 MCG/2ML IJ SOLN
INTRAMUSCULAR | Status: DC | PRN
  Administered 2018-09-18 (×2): 25 ug via INTRAVENOUS

## 2018-09-18 MED ORDER — SODIUM CHLORIDE 0.9 % WEIGHT BASED INFUSION: 1.0000 mL/kg/h | INTRAVENOUS | Status: DC

## 2018-09-18 MED ORDER — IOPAMIDOL (ISOVUE-300) INJECTION 61%
INTRAVENOUS | Status: DC | PRN
  Administered 2018-09-18: 160 mL via INTRA_ARTERIAL

## 2018-09-18 MED ORDER — FENTANYL CITRATE (PF) 100 MCG/2ML IJ SOLN
INTRAMUSCULAR | Status: AC
  Filled 2018-09-18: qty 2

## 2018-09-18 MED ORDER — SODIUM CHLORIDE 0.9 % WEIGHT BASED INFUSION
3.0000 mL/kg/h | INTRAVENOUS | Status: AC
  Administered 2018-09-18: 3 mL/kg/h via INTRAVENOUS

## 2018-09-18 MED ORDER — SODIUM CHLORIDE 0.9% FLUSH: 3.0000 mL | INTRAVENOUS | Status: DC | PRN

## 2018-09-18 MED ORDER — ONDANSETRON HCL 4 MG/2ML IJ SOLN: 4.0000 mg | Freq: Four times a day (QID) | INTRAMUSCULAR | Status: DC | PRN

## 2018-09-18 MED ORDER — MIDAZOLAM HCL 2 MG/2ML IJ SOLN
INTRAMUSCULAR | Status: AC
  Filled 2018-09-18: qty 2

## 2018-09-18 MED ORDER — MIDAZOLAM HCL 2 MG/2ML IJ SOLN
INTRAMUSCULAR | Status: DC | PRN
  Administered 2018-09-18: 1 mg via INTRAVENOUS

## 2018-09-18 SURGICAL SUPPLY — 14 items
CATH AMP RT 5F (CATHETERS) ×1 IMPLANT
CATH INFINITI 5 FR 3DRC (CATHETERS) ×1 IMPLANT
CATH INFINITI 5 FR JR3.5 (CATHETERS) ×1 IMPLANT
CATH INFINITI 5FR ANG PIGTAIL (CATHETERS) ×1 IMPLANT
CATH INFINITI 5FR JL4 (CATHETERS) ×1 IMPLANT
CATH INFINITI JR4 5F (CATHETERS) ×1 IMPLANT
DEVICE CLOSURE MYNXGRIP 5F (Vascular Products) ×1 IMPLANT
KIT MANI 3VAL PERCEP (MISCELLANEOUS) ×2 IMPLANT
NDL PERC 18GX7CM (NEEDLE) IMPLANT
NEEDLE PERC 18GX7CM (NEEDLE) ×2 IMPLANT
PACK CARDIAC CATH (CUSTOM PROCEDURE TRAY) ×2 IMPLANT
SHEATH AVANTI 5FR X 11CM (SHEATH) ×1 IMPLANT
WIRE GUIDERIGHT .035X150 (WIRE) ×1 IMPLANT
WIRE HITORQ VERSACORE ST 145CM (WIRE) ×1 IMPLANT

## 2018-09-18 NOTE — Discharge Instructions (Signed)

## 2018-10-27 ENCOUNTER — Encounter: Payer: Self-pay | Admitting: *Deleted

## 2018-10-27 ENCOUNTER — Encounter: Payer: Medicare HMO | Attending: Internal Medicine | Admitting: *Deleted

## 2018-10-27 VITALS — Ht 64.9 in | Wt 208.7 lb

## 2018-10-27 DIAGNOSIS — Z955 Presence of coronary angioplasty implant and graft: Secondary | ICD-10-CM | POA: Insufficient documentation

## 2018-10-27 NOTE — Patient Instructions (Signed)
Patient Instructions  Patient Details  Name: Tammy Hatfield MRN: [id number] Date of Birth: 1946/05/30 Referring Provider:  Adella Nissen*  Below are your personal goals for exercise, nutrition, and risk factors. Our goal is to help you stay on track towards obtaining and maintaining these goals. We will be discussing your progress on these goals with you throughout the program.  Initial Exercise Prescription: Initial Exercise Prescription - 10/27/18 1500      Date of Initial Exercise RX and Referring Provider   Date  10/27/18    Referring Provider  [name] MD      Treadmill   MPH  1.5    Grade  0.5    Minutes  15    METs  2.25      NuStep   Level  2    SPM  80    Minutes  15    METs  2.2      Arm Ergometer   Level  2    Watts  34    RPM  30    Minutes  15    METs  2.2      Prescription Details   Frequency (times per week)  3    Duration  Progress to 45 minutes of aerobic exercise without signs/symptoms of physical distress      Intensity   THRR 40-80% of Max Heartrate  98-131    Ratings of Perceived Exertion  11-13    Perceived Dyspnea  0-4      Progression   Progression  Continue to progress workloads to maintain intensity without signs/symptoms of physical distress.      Resistance Training   Training Prescription  Yes    Weight  3 lbs    Reps  10-15       Exercise Goals: Frequency: Be able to perform aerobic exercise two to three times per week in program working toward 2-5 days per week of home exercise.  Intensity: Work with a perceived exertion of 11 (fairly light) - 15 (hard) while following your exercise prescription.  We will make changes to your prescription with you as you progress through the program.   Duration: Be able to do 30 to 45 minutes of continuous aerobic exercise in addition to a 5 minute warm-up and a 5 minute cool-down routine.   Nutrition Goals: Your personal nutrition goals will be established when you do  your nutrition analysis with the dietician.  The following are general nutrition guidelines to follow: Cholesterol < 200mg /day Sodium < 1500mg /day Fiber: Women over 50 yrs - 21 grams per day  Personal Goals: Personal Goals and Risk Factors at Admission - 10/27/18 1221      Core Components/Risk Factors/Patient Goals on Admission    Weight Management  Yes;Weight Loss    Intervention  Weight Management: Develop a combined nutrition and exercise program designed to reach desired caloric intake, while maintaining appropriate intake of nutrient and fiber, sodium and fats, and appropriate energy expenditure required for the weight goal.;Weight Management/Obesity: Establish reasonable short term and long term weight goals.   HAs been losing weight on the Keto Diet   Admit Weight  208 lb (94.3 kg)    Goal Weight: Short Term  206 lb (93.4 kg)    Goal Weight: Long Term  200 lb (90.7 kg)    Expected Outcomes  Short Term: Continue to assess and modify interventions until short term weight is achieved;Long Term: Adherence to nutrition and physical activity/exercise program aimed  toward attainment of established weight goal;Weight Loss: Understanding of general recommendations for a balanced deficit meal plan, which promotes 1-2 lb weight loss per week and includes a negative energy balance of [phone] kcal/d    Diabetes  Yes    Intervention  Provide education about signs/symptoms and action to take for hypo/hyperglycemia.;Provide education about proper nutrition, including hydration, and aerobic/resistive exercise prescription along with prescribed medications to achieve blood glucose in normal ranges: Fasting glucose 65-99 mg/dL    Expected Outcomes  Short Term: Participant verbalizes understanding of the signs/symptoms and immediate care of hyper/hypoglycemia, proper foot care and importance of medication, aerobic/resistive exercise and nutrition plan for blood glucose control.;Long Term: Attainment of HbA1C  < 7%.    Hypertension  Yes    Intervention  Provide education on lifestyle modifcations including regular physical activity/exercise, weight management, moderate sodium restriction and increased consumption of fresh fruit, vegetables, and low fat dairy, alcohol moderation, and smoking cessation.;Monitor prescription use compliance.    Expected Outcomes  Short Term: Continued assessment and intervention until BP is < 140/54mm HG in hypertensive participants. < 130/57mm HG in hypertensive participants with diabetes, heart failure or chronic kidney disease.;Long Term: Maintenance of blood pressure at goal levels.    Lipids  Yes    Intervention  Provide education and support for participant on nutrition & aerobic/resistive exercise along with prescribed medications to achieve LDL 70mg , HDL >40mg .    Expected Outcomes  Short Term: Participant states understanding of desired cholesterol values and is compliant with medications prescribed. Participant is following exercise prescription and nutrition guidelines.;Long Term: Cholesterol controlled with medications as prescribed, with individualized exercise RX and with personalized nutrition plan. Value goals: LDL < 70mg , HDL > 40 mg.       Tobacco Use Initial Evaluation: Social History   Tobacco Use  Smoking Status Former Smoker  . Packs/day: 2.00  . Years: 30.00  . Pack years: 60.00  . Last attempt to quit: 02/01/2000  . Years since quitting: 18.7  Smokeless Tobacco Never Used  Tobacco Comment   07/18/2017 former smoker quit 1999    Exercise Goals and Review: Exercise Goals    Row Name 10/27/18 1522             Exercise Goals   Increase Physical Activity  Yes       Intervention  Provide advice, education, support and counseling about physical activity/exercise needs.;Develop an individualized exercise prescription for aerobic and resistive training based on initial evaluation findings, risk stratification, comorbidities and participant's  personal goals.       Expected Outcomes  Short Term: Attend rehab on a regular basis to increase amount of physical activity.;Long Term: Add in home exercise to make exercise part of routine and to increase amount of physical activity.;Long Term: Exercising regularly at least 3-5 days a week.       Increase Strength and Stamina  Yes       Intervention  Provide advice, education, support and counseling about physical activity/exercise needs.;Develop an individualized exercise prescription for aerobic and resistive training based on initial evaluation findings, risk stratification, comorbidities and participant's personal goals.       Expected Outcomes  Short Term: Increase workloads from initial exercise prescription for resistance, speed, and METs.;Short Term: Perform resistance training exercises routinely during rehab and add in resistance training at home;Long Term: Improve cardiorespiratory fitness, muscular endurance and strength as measured by increased METs and functional capacity (6MWT)       Able to understand and use  rate of perceived exertion (RPE) scale  Yes       Intervention  Provide education and explanation on how to use RPE scale       Expected Outcomes  Short Term: Able to use RPE daily in rehab to express subjective intensity level;Long Term:  Able to use RPE to guide intensity level when exercising independently       Able to understand and use Dyspnea scale  Yes       Intervention  Provide education and explanation on how to use Dyspnea scale       Expected Outcomes  Short Term: Able to use Dyspnea scale daily in rehab to express subjective sense of shortness of breath during exertion;Long Term: Able to use Dyspnea scale to guide intensity level when exercising independently       Knowledge and understanding of Target Heart Rate Range (THRR)  Yes       Intervention  Provide education and explanation of THRR including how the numbers were predicted and where they are located for  reference       Expected Outcomes  Short Term: Able to state/look up THRR;Long Term: Able to use THRR to govern intensity when exercising independently;Short Term: Able to use daily as guideline for intensity in rehab       Able to check pulse independently  Yes       Intervention  Provide education and demonstration on how to check pulse in carotid and radial arteries.;Review the importance of being able to check your own pulse for safety during independent exercise       Expected Outcomes  Short Term: Able to explain why pulse checking is important during independent exercise;Long Term: Able to check pulse independently and accurately       Understanding of Exercise Prescription  Yes       Intervention  Provide education, explanation, and written materials on patient's individual exercise prescription       Expected Outcomes  Short Term: Able to explain program exercise prescription;Long Term: Able to explain home exercise prescription to exercise independently          Copy of goals given to participant.

## 2018-10-27 NOTE — Progress Notes (Signed)
Cardiac Individual Treatment Plan  Patient Details  Name: Tammy Hatfield MRN: [id number] Date of Birth: 03-24-1946 Referring Provider:     Cardiac Rehab from 10/27/2018 in [facility] Cardiac and Pulmonary Rehab  Referring Provider  [name] MD      Initial Encounter Date:    Cardiac Rehab from 10/27/2018 in [facility] Cardiac and Pulmonary Rehab  Date  10/27/18      Visit Diagnosis: Status post coronary artery stent placement  Patient's Home Medications on Admission:  Current Outpatient Medications:  .  aspirin EC 81 MG tablet, Take 81 mg by mouth daily., Disp: , Rfl:  .  Carboxymethylcellul-Glycerin (LUBRICATING EYE DROPS OP), Place 1 drop into both eyes 2 (two) times daily., Disp: , Rfl:  .  clopidogrel (PLAVIX) 75 MG tablet, Take 75 mg by mouth every evening. , Disp: , Rfl:  .  glipiZIDE (GLUCOTROL) 10 MG tablet, Take 10 mg by mouth 2 (two) times daily. , Disp: , Rfl:  .  ibuprofen (ADVIL,MOTRIN) 200 MG tablet, Take 400 mg by mouth daily as needed for headache or moderate pain., Disp: , Rfl:  .  insulin NPH-regular Human (NOVOLIN 70/30) (70-30) 100 UNIT/ML injection, Inject 25 Units into the skin 2 (two) times daily with a meal. , Disp: , Rfl:  .  isosorbide mononitrate (IMDUR) 120 MG 24 hr tablet, Take 120 mg by mouth daily., Disp: , Rfl:  .  losartan (COZAAR) 50 MG tablet, Take 50 mg by mouth every morning. , Disp: , Rfl:  .  metoprolol succinate (TOPROL-XL) 25 MG 24 hr tablet, Take 25 mg by mouth daily. , Disp: , Rfl:  .  nitroGLYCERIN (NITROSTAT) 0.4 MG SL tablet, Place 1 tablet (0.4 mg total) under the tongue every 5 (five) minutes as needed for chest pain., Disp: 30 tablet, Rfl: 0 .  pantoprazole (PROTONIX) 20 MG tablet, Take 20 mg by mouth daily., Disp: , Rfl:  .  PARoxetine (PAXIL) 40 MG tablet, Take 40 mg by mouth 2 (two) times daily. , Disp: , Rfl:  .  rosuvastatin (CRESTOR) 20 MG tablet, Take 20 mg by mouth every evening., Disp: , Rfl:  .  traMADol (ULTRAM) 50 MG  tablet, Take 50-100 mg by mouth 2 (two) times daily as needed for moderate pain. , Disp: , Rfl:  .  valACYclovir (VALTREX) 500 MG tablet, Take 500 mg by mouth 2 (two) times daily as needed (fever blisters). , Disp: , Rfl:   Past Medical History: Past Medical History:  Diagnosis Date  . Anginal pain ([place])   . Anxiety   . Arthritis    knees  . Barrett's esophagus   . Breast cancer ([place]) 2014   RT LUMPECTOMY  . Cancer [facility]) 2014   right breast - s\p Radiation and lumpectomy and LN dissection  . Chronic sinusitis   . Complication of anesthesia    hard to wake after Fentanyl  . COPD (chronic obstructive pulmonary disease) ([place]) 2012  . Coronary artery disease   . Degenerative lumbar disc   . Diabetes mellitus without complication ([place])   . Dysphagia   . Esophageal reflux   . Fatty liver   . History of Helicobacter pylori infection   . Hypercholesteremia   . Hyperlipidemia   . Hypertension   . Personal history of radiation therapy   . Pneumonia   . Radiation 2014   RT BREAST CA  . Sleep apnea    CPAP  . Ulcer, stomach peptic    history of  .  Vertigo    last episode 2017(approx)    Tobacco Use: Social History   Tobacco Use  Smoking Status Former Smoker  . Packs/day: 2.00  . Years: 30.00  . Pack years: 60.00  . Last attempt to quit: 02/01/2000  . Years since quitting: 18.7  Smokeless Tobacco Never Used  Tobacco Comment   07/18/2017 former smoker quit 1999    Labs: Recent Review Flowsheet Data    Labs for ITP Cardiac and Pulmonary Rehab Latest Ref Rng & Units 02/11/2015 07/05/2017   Cholestrol 0 - 200 mg/dL - 255(H)   LDLCALC 0 - 99 mg/dL - 147(H)   HDL >40 mg/dL - 62   Trlycerides <150 mg/dL - 231(H)   Hemoglobin A1c 4.0 - 6.0 % 7.4(H) -       Exercise Target Goals: Exercise Program Goal: Individual exercise prescription set using results from initial 6 min walk test and THRR while considering  patient's activity barriers and safety.   Exercise Prescription  Goal: Initial exercise prescription builds to 30-45 minutes a day of aerobic activity, 2-3 days per week.  Home exercise guidelines will be given to patient during program as part of exercise prescription that the participant will acknowledge.  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 HR  116 bpm     Max Ex. BP  138/64     2 Minute Post BP  126/56        Oxygen Initial Assessment:   Oxygen Re-Evaluation:   Oxygen Discharge (Final Oxygen Re-Evaluation):   Initial Exercise Prescription: Initial Exercise Prescription - 10/27/18 1500      Date of Initial Exercise RX and Referring Provider   Date  10/27/18    Referring Provider  [name] MD      Treadmill   MPH  1.5    Grade  0.5    Minutes  15    METs  2.25      NuStep   Level  2    SPM  80    Minutes  15    METs  2.2      Arm Ergometer   Level  2    Watts  34    RPM  30    Minutes  15    METs  2.2      Prescription  Details   Frequency (times per week)  3    Duration  Progress to 45 minutes of aerobic exercise without signs/symptoms of physical distress      Intensity   THRR 40-80% of Max Heartrate  98-131    Ratings of Perceived Exertion  11-13     Perceived Dyspnea  0-4      Progression   Progression  Continue to progress workloads to maintain intensity without signs/symptoms of physical distress.      Resistance Training   Training Prescription  Yes    Weight  3 lbs    Reps  10-15       Perform Capillary Blood Glucose checks as needed.  Exercise Prescription Changes: Exercise Prescription Changes    Row Name 10/27/18 1200             Response to Exercise   Blood Pressure (Admit)  130/74       Blood Pressure (Exercise)  138/64       Blood Pressure (Exit)  126/56       Heart Rate (Admit)  65 bpm       Heart Rate (Exercise)  116 bpm       Heart Rate (Exit)  71 bpm       Oxygen Saturation (Admit)  97 %       Oxygen Saturation (Exercise)  94 %       Rating of Perceived Exertion (Exercise)  12       Symptoms  hip pain 5/10, knee pain 4/10       Comments  walk test results          Exercise Comments:   Exercise Goals and Review: Exercise Goals    Row Name 10/27/18 1522             Exercise Goals   Increase Physical Activity  Yes       Intervention  Provide advice, education, support and counseling about physical activity/exercise needs.;Develop an individualized exercise prescription for aerobic and resistive training based on initial evaluation findings, risk stratification, comorbidities and participant's personal goals.       Expected Outcomes  Short Term: Attend rehab on a regular basis to increase amount of physical activity.;Long Term: Add in home exercise to make exercise part of routine and to increase amount of physical activity.;Long Term: Exercising regularly at least 3-5 days a week.       Increase Strength and Stamina  Yes       Intervention  Provide advice, education, support and counseling about physical activity/exercise needs.;Develop an individualized exercise prescription for aerobic and resistive training based on initial evaluation findings, risk stratification, comorbidities and  participant's personal goals.       Expected Outcomes  Short Term: Increase workloads from initial exercise prescription for resistance, speed, and METs.;Short Term: Perform resistance training exercises routinely during rehab and add in resistance training at home;Long Term: Improve cardiorespiratory fitness, muscular endurance and strength as measured by increased METs and functional capacity (6MWT)       Able to understand and use rate of perceived exertion (RPE) scale  Yes       Intervention  Provide education and explanation on how to use RPE scale       Expected Outcomes  Short Term: Able to use RPE daily in rehab to express subjective intensity level;Long Term:  Able to use RPE to guide intensity level when exercising independently       Able to understand and  use Dyspnea scale  Yes       Intervention  Provide education and explanation on how to use Dyspnea scale       Expected Outcomes  Short Term: Able to use Dyspnea scale daily in rehab to express subjective sense of shortness of breath during exertion;Long Term: Able to use Dyspnea scale to guide intensity level when exercising independently       Knowledge and understanding of Target Heart Rate Range (THRR)  Yes       Intervention  Provide education and explanation of THRR including how the numbers were predicted and where they are located for reference       Expected Outcomes  Short Term: Able to state/look up THRR;Long Term: Able to use THRR to govern intensity when exercising independently;Short Term: Able to use daily as guideline for intensity in rehab       Able to check pulse independently  Yes       Intervention  Provide education and demonstration on how to check pulse in carotid and radial arteries.;Review the importance of being able to check your own pulse for safety during independent exercise       Expected Outcomes  Short Term: Able to explain why pulse checking is important during independent exercise;Long Term: Able to check  pulse independently and accurately       Understanding of Exercise Prescription  Yes       Intervention  Provide education, explanation, and written materials on patient's individual exercise prescription       Expected Outcomes  Short Term: Able to explain program exercise prescription;Long Term: Able to explain home exercise prescription to exercise independently          Exercise Goals Re-Evaluation :   Discharge Exercise Prescription (Final Exercise Prescription Changes): Exercise Prescription Changes - 10/27/18 1200      Response to Exercise   Blood Pressure (Admit)  130/74    Blood Pressure (Exercise)  138/64    Blood Pressure (Exit)  126/56    Heart Rate (Admit)  65 bpm    Heart Rate (Exercise)  116 bpm    Heart Rate (Exit)  71 bpm    Oxygen Saturation (Admit)  97 %    Oxygen Saturation (Exercise)  94 %    Rating of Perceived Exertion (Exercise)  12    Symptoms  hip pain 5/10, knee pain 4/10    Comments  walk test results       Nutrition:  Target Goals: Understanding of nutrition guidelines, daily intake of sodium <1581m, cholesterol <209m calories 30% from fat and 7% or less from saturated fats, daily to have 5 or more servings of fruits and vegetables.  Biometrics: Pre Biometrics - 10/27/18 1523      Pre Biometrics   Height  5' 4.9" (1.648 m)    Weight  208 lb 11.2 oz (94.7 kg)    Waist Circumference  40 inches    Hip Circumference  46 inches    Waist to Hip Ratio  0.87 %    BMI (Calculated)  34.86    Single Leg Stand  5.64 seconds        Nutrition Therapy Plan and Nutrition Goals: Nutrition Therapy & Goals - 10/27/18 1220      Intervention Plan   Intervention  Prescribe, educate and counsel regarding individualized specific dietary modifications aiming towards targeted core components such as weight, hypertension, lipid management, diabetes, heart failure and other comorbidities.    Expected Outcomes  Short Term Goal: Understand basic principles of  dietary content, such as calories, fat, sodium, cholesterol and nutrients.;Short Term Goal: A plan has been developed with personal nutrition goals set during dietitian appointment.;Long Term Goal: Adherence to prescribed nutrition plan.       Nutrition Assessments:   Nutrition Goals Re-Evaluation:   Nutrition Goals Discharge (Final Nutrition Goals Re-Evaluation):   Psychosocial: Target Goals: Acknowledge presence or absence of significant depression and/or stress, maximize coping skills, provide positive support system. Participant is able to verbalize types and ability to use techniques and skills needed for reducing stress and depression.   Initial Review & Psychosocial Screening: Initial Psych Review & Screening - 10/27/18 30-Nov-1216      Initial Review   Current issues with  None Identified      Family Dynamics   Good Support System?  Yes   FAmily   Comments  Husband passed away 11/30/2016. States is doing well today, is active at [employer].       Barriers   Psychosocial barriers to participate in program  There are no identifiable barriers or psychosocial needs.;The patient should benefit from training in stress management and relaxation.      Screening Interventions   Interventions  Encouraged to exercise;To provide support and resources with identified psychosocial needs;Provide feedback about the scores to participant    Expected Outcomes  Short Term goal: Utilizing psychosocial counselor, staff and physician to assist with identification of specific Stressors or current issues interfering with healing process. Setting desired goal for each stressor or current issue identified.;Long Term Goal: Stressors or current issues are controlled or eliminated.;Short Term goal: Identification and review with participant of any Quality of Life or Depression concerns found by scoring the questionnaire.;Long Term goal: The participant improves quality of Life and PHQ9 Scores as seen by post scores  and/or verbalization of changes       Quality of Life Scores:   Scores of 19 and below usually indicate a poorer quality of life in these areas.  A difference of  2-3 points is a clinically meaningful difference.  A difference of 2-3 points in the total score of the Quality of Life Index has been associated with significant improvement in overall quality of life, self-image, physical symptoms, and general health in studies assessing change in quality of life.  PHQ-9: Recent Review Flowsheet Data    Depression screen [facility] 2/9 10/27/2018 09/19/2017 07/18/2017   Decreased Interest 0 0 0   Down, Depressed, Hopeless 1 1 0   PHQ - 2 Score 1 1 0   Altered sleeping 0 1 1    Tired, decreased energy 0 1 3   Change in appetite 0 1 2   Feeling bad or failure about yourself  0 0 0   Trouble concentrating 0 0 0   Moving slowly or fidgety/restless 0 1 0   Suicidal thoughts 0 0 0   PHQ-9 Score 1 5 6    Difficult doing work/chores Not difficult at all - Not difficult at all     Interpretation of Total Score  Total Score Depression Severity:  1-4 = Minimal depression, 5-9 = Mild depression, 10-14 = Moderate depression, 15-19 = Moderately severe depression, 20-27 = Severe depression   Psychosocial Evaluation and Intervention:   Psychosocial Re-Evaluation:   Psychosocial Discharge (Final Psychosocial Re-Evaluation):   Vocational Rehabilitation: Provide vocational rehab assistance to qualifying candidates.   Vocational Rehab Evaluation & Intervention: Vocational Rehab - 10/27/18 1221      Initial  Vocational Rehab Evaluation & Intervention   Assessment shows need for Vocational Rehabilitation  No       Education: Education Goals: Education classes will be provided on a variety of topics geared toward better understanding of heart health and risk factor modification. Participant will state understanding/return demonstration of topics presented as noted by education test scores.  Learning  Barriers/Preferences: Learning Barriers/Preferences - 10/27/18 1220      Learning Barriers/Preferences   Learning Barriers  None    Learning Preferences  Skilled Demonstration   Likes to learn with hands on      Education Topics:  AED/CPR: - Group verbal and written instruction with the use of models to demonstrate the basic use of the AED with the basic ABC's of resuscitation.   General Nutrition Guidelines/Fats and Fiber: -Group instruction provided by verbal, written material, models and posters to present the general guidelines for heart healthy nutrition. Gives an explanation and review of dietary fats and fiber.   Cardiac Rehab from 10/08/2017 in [facility] Cardiac and Pulmonary Rehab  Date  10/08/17  Educator  CR  Instruction Review Code  1- Verbalizes Understanding      Controlling Sodium/Reading Food Labels: -Group verbal and written material supporting the discussion of sodium use in heart healthy nutrition. Review and explanation with models, verbal and written materials for utilization of the food label.   Cardiac Rehab from 10/08/2017 in [facility] Cardiac and Pulmonary Rehab  Date  08/27/17  Educator  CR  Instruction Review Code  1- Verbalizes Understanding      Exercise Physiology & General Exercise Guidelines: - Group verbal and written instruction with models to review the exercise physiology of the cardiovascular system and associated critical values. Provides general exercise guidelines with specific guidelines to those with heart or lung disease.    Cardiac Rehab from 10/08/2017 in [facility] Cardiac and Pulmonary Rehab  Date  09/05/17  Educator  [facility]  Instruction Review Code  1- Verbalizes Understanding      Aerobic Exercise & Resistance Training: - Gives group verbal and written instruction on the various components of exercise. Focuses on aerobic and resistive training programs and the benefits of this training and how to safely progress through these programs..   Cardiac  Rehab from 10/08/2017 in [facility] Cardiac and Pulmonary Rehab  Date  09/12/17  Educator  [facility]  Instruction Review Code  1- Verbalizes Understanding      Flexibility, Balance, Mind/Body Relaxation: Provides group verbal/written instruction on the benefits of flexibility and balance training, including mind/body exercise modes such as yoga, pilates and tai chi.  Demonstration and skill practice provided.   Cardiac Rehab from 10/08/2017 in [facility] Cardiac and Pulmonary Rehab  Date  09/17/17  Educator  AS  Instruction Review Code  1- Verbalizes Understanding      Stress and Anxiety: - Provides group verbal and written instruction about the health risks of elevated stress and causes of high stress.  Discuss the correlation between heart/lung disease and anxiety and treatment options. Review healthy ways to manage with stress and anxiety.   Cardiac Rehab from 10/08/2017 in [facility] Cardiac and Pulmonary Rehab  Date  10/01/17  Educator  [facility]  Instruction Review Code  1- Verbalizes Understanding      Depression: - Provides group verbal and written instruction on the correlation between heart/lung disease and depressed mood, treatment options, and the stigmas associated with seeking treatment.   Anatomy & Physiology of the Heart: - Group verbal and written instruction and models provide  basic cardiac anatomy and physiology, with the coronary electrical and arterial systems. Review of Valvular disease and Heart Failure   Cardiac Rehab from 10/08/2017 in [facility] Cardiac and Pulmonary Rehab  Date  10/03/17  Educator  CE  Instruction Review Code  1- Verbalizes Understanding      Cardiac Procedures: - Group verbal and written instruction to review commonly prescribed medications for heart disease. Reviews the medication, class of the drug, and side effects. Includes the steps to properly store meds and maintain the prescription regimen. (beta blockers and nitrates)   Cardiac Rehab from 10/08/2017 in [facility] Cardiac  and Pulmonary Rehab  Date  07/30/17  Educator  SB  Instruction Review Code  1- Verbalizes Understanding      Cardiac Medications I: - Group verbal and written instruction to review commonly prescribed medications for heart disease. Reviews the medication, class of the drug, and side effects. Includes the steps to properly store meds and maintain the prescription regimen.   Cardiac Rehab from 10/08/2017 in [facility] Cardiac and Pulmonary Rehab  Date  09/24/17  Educator  SB  Instruction Review Code  1- Verbalizes Understanding      Cardiac Medications II: -Group verbal and written instruction to review commonly prescribed medications for heart disease. Reviews the medication, class of the drug, and side effects. (all other drug classes)   Cardiac Rehab from 10/08/2017 in [facility] Cardiac and Pulmonary Rehab  Date  09/19/17 [facility] Factors]  Educator  [facility]  Instruction Review Code  1- Verbalizes Understanding       Go Sex-Intimacy & Heart Disease, Get SMART - Goal Setting: - Group verbal and written instruction through game format to discuss heart disease and the return to sexual intimacy. Provides group verbal and written material to discuss and apply goal setting through the application of the S.M.A.R.T. Method.   Cardiac Rehab from 10/08/2017 in [facility] Cardiac and Pulmonary Rehab  Date  07/30/17  Educator  SB  Instruction Review Code  1- Verbalizes Understanding      Other Matters of the Heart: - Provides group verbal, written materials and models to describe Stable Angina and Peripheral Artery. Includes description of the disease process and treatment options available to the cardiac patient.   Cardiac Rehab from 10/08/2017 in [facility] Cardiac and Pulmonary Rehab  Date  10/03/17  Educator  CE  Instruction Review Code  1- Verbalizes Understanding      Exercise & Equipment Safety: - Individual verbal instruction and demonstration of equipment use and safety with use of the equipment.   Cardiac  Rehab from 10/27/2018 in [facility] Cardiac and Pulmonary Rehab  Date  10/27/18  Educator  SB  Instruction Review Code  1- Verbalizes Understanding      Infection Prevention: - Provides verbal and written material to individual with discussion of infection control including proper hand washing and proper equipment cleaning during exercise session.   Cardiac Rehab from 10/27/2018 in [facility] Cardiac and Pulmonary Rehab  Date  10/27/18  Educator  SB  Instruction Review Code  1- Verbalizes Understanding      Falls Prevention: - Provides verbal and written material to individual with discussion of falls prevention and safety.   Cardiac Rehab from 10/27/2018 in [facility] Cardiac and Pulmonary Rehab  Date  10/27/18  Educator  SB  Instruction Review Code  1- Verbalizes Understanding      Diabetes: - Individual verbal and written instruction to review signs/symptoms of diabetes, desired ranges of glucose level fasting, after meals and with exercise.  Acknowledge that pre and post exercise glucose checks will be done for 3 sessions at entry of program.   Cardiac Rehab from 10/08/2017 in [facility] Cardiac and Pulmonary Rehab  Date  07/18/17  Educator  Sb  Instruction Review Code  1- Verbalizes Understanding      Know Your Numbers and Risk Factors: -Group verbal and written instruction about important numbers in your health.  Discussion of what are risk factors and how they play a role in the disease process.  Review of Cholesterol, Blood Pressure, Diabetes, and BMI and the role they play in your overall health.   Cardiac Rehab from 10/08/2017 in [facility] Cardiac and Pulmonary Rehab  Date  09/19/17 [facility] Factors]  Educator  [facility]  Instruction Review Code  1- Verbalizes Understanding      Sleep Hygiene: -Provides group verbal and written instruction about how sleep can affect your health.  Define sleep hygiene, discuss sleep cycles and impact of sleep habits. Review good sleep hygiene tips.    Other: -Provides  group and verbal instruction on various topics (see comments)   Knowledge Questionnaire Score:   Core Components/Risk Factors/Patient Goals at Admission: Personal Goals and Risk Factors at Admission - 10/27/18 1221      Core Components/Risk Factors/Patient Goals on Admission    Weight Management  Yes;Weight Loss    Intervention  Weight Management: Develop a combined nutrition and exercise program designed to reach desired caloric intake, while maintaining appropriate intake of nutrient and fiber, sodium and fats, and appropriate energy expenditure required for the weight goal.;Weight Management/Obesity: Establish reasonable short term and long term weight goals.   HAs been losing weight on the Keto Diet   Admit Weight  208 lb (94.3 kg)    Goal Weight: Short Term  206 lb (93.4 kg)    Goal Weight: Long Term  200 lb (90.7 kg)    Expected Outcomes  Short Term: Continue to assess and modify interventions until short term weight is achieved;Long Term: Adherence to nutrition and physical activity/exercise program aimed toward attainment of established weight goal;Weight Loss: Understanding of general recommendations for a balanced deficit meal plan, which promotes 1-2 lb weight loss per week and includes a negative energy balance of [phone] kcal/d    Diabetes  Yes    Intervention  Provide education about signs/symptoms and action to take for hypo/hyperglycemia.;Provide education about proper nutrition, including hydration, and aerobic/resistive exercise prescription along with prescribed medications to achieve blood glucose in normal ranges: Fasting glucose 65-99 mg/dL    Expected Outcomes  Short Term: Participant verbalizes understanding of the signs/symptoms and immediate care of hyper/hypoglycemia, proper foot care and importance of medication, aerobic/resistive exercise and nutrition plan for blood glucose control.;Long Term: Attainment of HbA1C < 7%.    Hypertension  Yes    Intervention  Provide  education on lifestyle modifcations including regular physical activity/exercise, weight management, moderate sodium restriction and increased consumption of fresh fruit, vegetables, and low fat dairy, alcohol moderation, and smoking cessation.;Monitor prescription use compliance.    Expected Outcomes  Short Term: Continued assessment and intervention until BP is < 140/32m HG in hypertensive participants. < 130/825mHG in hypertensive participants with diabetes, heart failure or chronic kidney disease.;Long Term: Maintenance of blood pressure at goal levels.    Lipids  Yes    Intervention  Provide education and support for participant on nutrition & aerobic/resistive exercise along with prescribed medications to achieve LDL <7071mHDL >9m48m  Expected Outcomes  Short Term: Participant states  understanding of desired cholesterol values and is compliant with medications prescribed. Participant is following exercise prescription and nutrition guidelines.;Long Term: Cholesterol controlled with medications as prescribed, with individualized exercise RX and with personalized nutrition plan. Value goals: LDL < 96m, HDL > 40 mg.       Core Components/Risk Factors/Patient Goals Review:    Core Components/Risk Factors/Patient Goals at Discharge (Final Review):    ITP Comments: ITP Comments    Row Name 10/27/18 1206           ITP Comments  Medical review completed today. INitial ITP sent to Dr MLoleta Chancefor review,changes as needed and signature. Documentation of diagnosis can be found in Care Everywhere1/28/2020          Comments: Initial ITP

## 2018-10-27 NOTE — Progress Notes (Signed)
Daily Session Note  Patient Details  Name: Tammy Hatfield MRN: [id number] Date of Birth: 06-Sep-1946 Referring Provider:     Cardiac Rehab from 10/27/2018 in [facility]  Referring Provider  [name] MD      Encounter Date: 10/27/2018  Check In: Session Check In - 10/27/18 1206      Check-In   Supervising physician immediately available to respond to emergencies  See telemetry face sheet for immediately available ER MD    Location  [facility]    Staff Present  Heath Lark, RN, BSN, CCRP;Jessica Sterling, MA, RCEP, CCRP, Exercise Physiologist    VAD Patient?  No    PAD/SET Patient?  No      Pain Assessment   Currently in Pain?  No/denies        Exercise Prescription Changes - 10/27/18 1200      Response to Exercise   Blood Pressure (Admit)  130/74    Blood Pressure (Exercise)  138/64    Blood Pressure (Exit)  126/56    Heart Rate (Admit)  65 bpm    Heart Rate (Exercise)  116 bpm    Heart Rate (Exit)  71 bpm    Oxygen Saturation (Admit)  97 %    Oxygen Saturation (Exercise)  94 %    Rating of Perceived Exertion (Exercise)  12    Symptoms  hip pain 5/10, knee pain 4/10    Comments  walk test results       Social History   Tobacco Use  Smoking Status Former Smoker  . Packs/day: 2.00  . Years: 30.00  . Pack years: 60.00  . Last attempt to quit: 02/01/2000  . Years since quitting: 18.7  Smokeless Tobacco Never Used  Tobacco Comment   07/18/2017 former smoker quit 1999    Goals Met:  Exercise tolerated well Personal goals reviewed No report of cardiac concerns or symptoms Strength training completed today  Goals Unmet:  Not Applicable  Comments: Medical evaluation and 6MWT completed   Dr. Emily Filbert is Medical Director for [facility].

## 2018-11-05 ENCOUNTER — Encounter: Payer: Self-pay | Admitting: *Deleted

## 2018-11-05 DIAGNOSIS — Z955 Presence of coronary angioplasty implant and graft: Secondary | ICD-10-CM

## 2018-11-05 NOTE — Progress Notes (Signed)
Cardiac Individual Treatment Plan  Patient Details  Name: Tammy Hatfield MRN: [id number] Date of Birth: 1945-11-01 Referring Provider:     Cardiac Rehab from 10/27/2018 in [facility] Cardiac and Pulmonary Rehab  Referring Provider  [name] MD      Initial Encounter Date:    Cardiac Rehab from 10/27/2018 in [facility] Cardiac and Pulmonary Rehab  Date  10/27/18      Visit Diagnosis: Status post coronary artery stent placement  Patient's Home Medications on Admission:  Current Outpatient Medications:  .  aspirin EC 81 MG tablet, Take 81 mg by mouth daily., Disp: , Rfl:  .  Carboxymethylcellul-Glycerin (LUBRICATING EYE DROPS OP), Place 1 drop into both eyes 2 (two) times daily., Disp: , Rfl:  .  clopidogrel (PLAVIX) 75 MG tablet, Take 75 mg by mouth every evening. , Disp: , Rfl:  .  glipiZIDE (GLUCOTROL) 10 MG tablet, Take 10 mg by mouth 2 (two) times daily. , Disp: , Rfl:  .  ibuprofen (ADVIL,MOTRIN) 200 MG tablet, Take 400 mg by mouth daily as needed for headache or moderate pain., Disp: , Rfl:  .  insulin NPH-regular Human (NOVOLIN 70/30) (70-30) 100 UNIT/ML injection, Inject 25 Units into the skin 2 (two) times daily with a meal. , Disp: , Rfl:  .  isosorbide mononitrate (IMDUR) 120 MG 24 hr tablet, Take 120 mg by mouth daily., Disp: , Rfl:  .  losartan (COZAAR) 50 MG tablet, Take 50 mg by mouth every morning. , Disp: , Rfl:  .  metoprolol succinate (TOPROL-XL) 25 MG 24 hr tablet, Take 25 mg by mouth daily. , Disp: , Rfl:  .  nitroGLYCERIN (NITROSTAT) 0.4 MG SL tablet, Place 1 tablet (0.4 mg total) under the tongue every 5 (five) minutes as needed for chest pain., Disp: 30 tablet, Rfl: 0 .  pantoprazole (PROTONIX) 20 MG tablet, Take 20 mg by mouth daily., Disp: , Rfl:  .  PARoxetine (PAXIL) 40 MG tablet, Take 40 mg by mouth 2 (two) times daily. , Disp: , Rfl:  .  rosuvastatin (CRESTOR) 20 MG tablet, Take 20 mg by mouth every evening., Disp: , Rfl:  .  traMADol (ULTRAM) 50 MG  tablet, Take 50-100 mg by mouth 2 (two) times daily as needed for moderate pain. , Disp: , Rfl:  .  valACYclovir (VALTREX) 500 MG tablet, Take 500 mg by mouth 2 (two) times daily as needed (fever blisters). , Disp: , Rfl:   Past Medical History: Past Medical History:  Diagnosis Date  . Anginal pain ([place])   . Anxiety   . Arthritis    knees  . Barrett's esophagus   . Breast cancer ([place]) 2014   RT LUMPECTOMY  . Cancer [facility]) 2014   right breast - s\p Radiation and lumpectomy and LN dissection  . Chronic sinusitis   . Complication of anesthesia    hard to wake after Fentanyl  . COPD (chronic obstructive pulmonary disease) ([place]) 2012  . Coronary artery disease   . Degenerative lumbar disc   . Diabetes mellitus without complication ([place])   . Dysphagia   . Esophageal reflux   . Fatty liver   . History of Helicobacter pylori infection   . Hypercholesteremia   . Hyperlipidemia   . Hypertension   . Personal history of radiation therapy   . Pneumonia   . Radiation 2014   RT BREAST CA  . Sleep apnea    CPAP  . Ulcer, stomach peptic    history of  .  Vertigo    last episode 2017(approx)    Tobacco Use: Social History   Tobacco Use  Smoking Status Former Smoker  . Packs/day: 2.00  . Years: 30.00  . Pack years: 60.00  . Last attempt to quit: 02/01/2000  . Years since quitting: 18.7  Smokeless Tobacco Never Used  Tobacco Comment   07/18/2017 former smoker quit 1999    Labs: Recent Review Flowsheet Data    Labs for ITP Cardiac and Pulmonary Rehab Latest Ref Rng & Units 02/11/2015 07/05/2017   Cholestrol 0 - 200 mg/dL - 255(H)   LDLCALC 0 - 99 mg/dL - 147(H)   HDL >40 mg/dL - 62   Trlycerides <150 mg/dL - 231(H)   Hemoglobin A1c 4.0 - 6.0 % 7.4(H) -       Exercise Target Goals: Exercise Program Goal: Individual exercise prescription set using results from initial 6 min walk test and THRR while considering  patient's activity barriers and safety.   Exercise Prescription  Goal: Initial exercise prescription builds to 30-45 minutes a day of aerobic activity, 2-3 days per week.  Home exercise guidelines will be given to patient during program as part of exercise prescription that the participant will acknowledge.  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 HR  116 bpm     Max Ex. BP  138/64     2 Minute Post BP  126/56        Oxygen Initial Assessment:   Oxygen Re-Evaluation:   Oxygen Discharge (Final Oxygen Re-Evaluation):   Initial Exercise Prescription: Initial Exercise Prescription - 10/27/18 1500      Date of Initial Exercise RX and Referring Provider   Date  10/27/18    Referring Provider  [name] MD      Treadmill   MPH  1.5    Grade  0.5    Minutes  15    METs  2.25      NuStep   Level  2    SPM  80    Minutes  15    METs  2.2      Arm Ergometer   Level  2    Watts  34    RPM  30    Minutes  15    METs  2.2      Prescription  Details   Frequency (times per week)  3    Duration  Progress to 45 minutes of aerobic exercise without signs/symptoms of physical distress      Intensity   THRR 40-80% of Max Heartrate  98-131    Ratings of Perceived Exertion  11-13     Perceived Dyspnea  0-4      Progression   Progression  Continue to progress workloads to maintain intensity without signs/symptoms of physical distress.      Resistance Training   Training Prescription  Yes    Weight  3 lbs    Reps  10-15       Perform Capillary Blood Glucose checks as needed.  Exercise Prescription Changes: Exercise Prescription Changes    Row Name 10/27/18 1200             Response to Exercise   Blood Pressure (Admit)  130/74       Blood Pressure (Exercise)  138/64       Blood Pressure (Exit)  126/56       Heart Rate (Admit)  65 bpm       Heart Rate (Exercise)  116 bpm       Heart Rate (Exit)  71 bpm       Oxygen Saturation (Admit)  97 %       Oxygen Saturation (Exercise)  94 %       Rating of Perceived Exertion (Exercise)  12       Symptoms  hip pain 5/10, knee pain 4/10       Comments  walk test results          Exercise Comments:   Exercise Goals and Review: Exercise Goals    Row Name 10/27/18 1522             Exercise Goals   Increase Physical Activity  Yes       Intervention  Provide advice, education, support and counseling about physical activity/exercise needs.;Develop an individualized exercise prescription for aerobic and resistive training based on initial evaluation findings, risk stratification, comorbidities and participant's personal goals.       Expected Outcomes  Short Term: Attend rehab on a regular basis to increase amount of physical activity.;Long Term: Add in home exercise to make exercise part of routine and to increase amount of physical activity.;Long Term: Exercising regularly at least 3-5 days a week.       Increase Strength and Stamina  Yes       Intervention  Provide advice, education, support and counseling about physical activity/exercise needs.;Develop an individualized exercise prescription for aerobic and resistive training based on initial evaluation findings, risk stratification, comorbidities and  participant's personal goals.       Expected Outcomes  Short Term: Increase workloads from initial exercise prescription for resistance, speed, and METs.;Short Term: Perform resistance training exercises routinely during rehab and add in resistance training at home;Long Term: Improve cardiorespiratory fitness, muscular endurance and strength as measured by increased METs and functional capacity (6MWT)       Able to understand and use rate of perceived exertion (RPE) scale  Yes       Intervention  Provide education and explanation on how to use RPE scale       Expected Outcomes  Short Term: Able to use RPE daily in rehab to express subjective intensity level;Long Term:  Able to use RPE to guide intensity level when exercising independently       Able to understand and  use Dyspnea scale  Yes       Intervention  Provide education and explanation on how to use Dyspnea scale       Expected Outcomes  Short Term: Able to use Dyspnea scale daily in rehab to express subjective sense of shortness of breath during exertion;Long Term: Able to use Dyspnea scale to guide intensity level when exercising independently       Knowledge and understanding of Target Heart Rate Range (THRR)  Yes       Intervention  Provide education and explanation of THRR including how the numbers were predicted and where they are located for reference       Expected Outcomes  Short Term: Able to state/look up THRR;Long Term: Able to use THRR to govern intensity when exercising independently;Short Term: Able to use daily as guideline for intensity in rehab       Able to check pulse independently  Yes       Intervention  Provide education and demonstration on how to check pulse in carotid and radial arteries.;Review the importance of being able to check your own pulse for safety during independent exercise       Expected Outcomes  Short Term: Able to explain why pulse checking is important during independent exercise;Long Term: Able to check  pulse independently and accurately       Understanding of Exercise Prescription  Yes       Intervention  Provide education, explanation, and written materials on patient's individual exercise prescription       Expected Outcomes  Short Term: Able to explain program exercise prescription;Long Term: Able to explain home exercise prescription to exercise independently          Exercise Goals Re-Evaluation :   Discharge Exercise Prescription (Final Exercise Prescription Changes): Exercise Prescription Changes - 10/27/18 1200      Response to Exercise   Blood Pressure (Admit)  130/74    Blood Pressure (Exercise)  138/64    Blood Pressure (Exit)  126/56    Heart Rate (Admit)  65 bpm    Heart Rate (Exercise)  116 bpm    Heart Rate (Exit)  71 bpm    Oxygen Saturation (Admit)  97 %    Oxygen Saturation (Exercise)  94 %    Rating of Perceived Exertion (Exercise)  12    Symptoms  hip pain 5/10, knee pain 4/10    Comments  walk test results       Nutrition:  Target Goals: Understanding of nutrition guidelines, daily intake of sodium <1559m, cholesterol <2067m calories 30% from fat and 7% or less from saturated fats, daily to have 5 or more servings of fruits and vegetables.  Biometrics: Pre Biometrics - 10/27/18 1523      Pre Biometrics   Height  5' 4.9" (1.648 m)    Weight  208 lb 11.2 oz (94.7 kg)    Waist Circumference  40 inches    Hip Circumference  46 inches    Waist to Hip Ratio  0.87 %    BMI (Calculated)  34.86    Single Leg Stand  5.64 seconds        Nutrition Therapy Plan and Nutrition Goals: Nutrition Therapy & Goals - 10/27/18 1220      Intervention Plan   Intervention  Prescribe, educate and counsel regarding individualized specific dietary modifications aiming towards targeted core components such as weight, hypertension, lipid management, diabetes, heart failure and other comorbidities.    Expected Outcomes  Short Term Goal: Understand basic principles of  dietary content, such as calories, fat, sodium, cholesterol and nutrients.;Short Term Goal: A plan has been developed with personal nutrition goals set during dietitian appointment.;Long Term Goal: Adherence to prescribed nutrition plan.       Nutrition Assessments:   Nutrition Goals Re-Evaluation:   Nutrition Goals Discharge (Final Nutrition Goals Re-Evaluation):   Psychosocial: Target Goals: Acknowledge presence or absence of significant depression and/or stress, maximize coping skills, provide positive support system. Participant is able to verbalize types and ability to use techniques and skills needed for reducing stress and depression.   Initial Review & Psychosocial Screening: Initial Psych Review & Screening - 10/27/18 November 22, 1216      Initial Review   Current issues with  None Identified      Family Dynamics   Good Support System?  Yes   FAmily   Comments  Husband passed away 11-22-16. States is doing well today, is active at [employer].       Barriers   Psychosocial barriers to participate in program  There are no identifiable barriers or psychosocial needs.;The patient should benefit from training in stress management and relaxation.      Screening Interventions   Interventions  Encouraged to exercise;To provide support and resources with identified psychosocial needs;Provide feedback about the scores to participant    Expected Outcomes  Short Term goal: Utilizing psychosocial counselor, staff and physician to assist with identification of specific Stressors or current issues interfering with healing process. Setting desired goal for each stressor or current issue identified.;Long Term Goal: Stressors or current issues are controlled or eliminated.;Short Term goal: Identification and review with participant of any Quality of Life or Depression concerns found by scoring the questionnaire.;Long Term goal: The participant improves quality of Life and PHQ9 Scores as seen by post scores  and/or verbalization of changes       Quality of Life Scores:   Scores of 19 and below usually indicate a poorer quality of life in these areas.  A difference of  2-3 points is a clinically meaningful difference.  A difference of 2-3 points in the total score of the Quality of Life Index has been associated with significant improvement in overall quality of life, self-image, physical symptoms, and general health in studies assessing change in quality of life.  PHQ-9: Recent Review Flowsheet Data    Depression screen [facility] 2/9 10/27/2018 09/19/2017 07/18/2017   Decreased Interest 0 0 0   Down, Depressed, Hopeless 1 1 0   PHQ - 2 Score 1 1 0   Altered sleeping 0 1 1    Tired, decreased energy 0 1 3   Change in appetite 0 1 2   Feeling bad or failure about yourself  0 0 0   Trouble concentrating 0 0 0   Moving slowly or fidgety/restless 0 1 0   Suicidal thoughts 0 0 0   PHQ-9 Score 1 5 6    Difficult doing work/chores Not difficult at all - Not difficult at all     Interpretation of Total Score  Total Score Depression Severity:  1-4 = Minimal depression, 5-9 = Mild depression, 10-14 = Moderate depression, 15-19 = Moderately severe depression, 20-27 = Severe depression   Psychosocial Evaluation and Intervention:   Psychosocial Re-Evaluation:   Psychosocial Discharge (Final Psychosocial Re-Evaluation):   Vocational Rehabilitation: Provide vocational rehab assistance to qualifying candidates.   Vocational Rehab Evaluation & Intervention: Vocational Rehab - 10/27/18 1221      Initial  Vocational Rehab Evaluation & Intervention   Assessment shows need for Vocational Rehabilitation  No       Education: Education Goals: Education classes will be provided on a variety of topics geared toward better understanding of heart health and risk factor modification. Participant will state understanding/return demonstration of topics presented as noted by education test scores.  Learning  Barriers/Preferences: Learning Barriers/Preferences - 10/27/18 1220      Learning Barriers/Preferences   Learning Barriers  None    Learning Preferences  Skilled Demonstration   Likes to learn with hands on      Education Topics:  AED/CPR: - Group verbal and written instruction with the use of models to demonstrate the basic use of the AED with the basic ABC's of resuscitation.   General Nutrition Guidelines/Fats and Fiber: -Group instruction provided by verbal, written material, models and posters to present the general guidelines for heart healthy nutrition. Gives an explanation and review of dietary fats and fiber.   Cardiac Rehab from 10/08/2017 in [facility] Cardiac and Pulmonary Rehab  Date  10/08/17  Educator  CR  Instruction Review Code  1- Verbalizes Understanding      Controlling Sodium/Reading Food Labels: -Group verbal and written material supporting the discussion of sodium use in heart healthy nutrition. Review and explanation with models, verbal and written materials for utilization of the food label.   Cardiac Rehab from 10/08/2017 in [facility] Cardiac and Pulmonary Rehab  Date  08/27/17  Educator  CR  Instruction Review Code  1- Verbalizes Understanding      Exercise Physiology & General Exercise Guidelines: - Group verbal and written instruction with models to review the exercise physiology of the cardiovascular system and associated critical values. Provides general exercise guidelines with specific guidelines to those with heart or lung disease.    Cardiac Rehab from 10/08/2017 in [facility] Cardiac and Pulmonary Rehab  Date  09/05/17  Educator  [facility]  Instruction Review Code  1- Verbalizes Understanding      Aerobic Exercise & Resistance Training: - Gives group verbal and written instruction on the various components of exercise. Focuses on aerobic and resistive training programs and the benefits of this training and how to safely progress through these programs..   Cardiac  Rehab from 10/08/2017 in [facility] Cardiac and Pulmonary Rehab  Date  09/12/17  Educator  [facility]  Instruction Review Code  1- Verbalizes Understanding      Flexibility, Balance, Mind/Body Relaxation: Provides group verbal/written instruction on the benefits of flexibility and balance training, including mind/body exercise modes such as yoga, pilates and tai chi.  Demonstration and skill practice provided.   Cardiac Rehab from 10/08/2017 in [facility] Cardiac and Pulmonary Rehab  Date  09/17/17  Educator  AS  Instruction Review Code  1- Verbalizes Understanding      Stress and Anxiety: - Provides group verbal and written instruction about the health risks of elevated stress and causes of high stress.  Discuss the correlation between heart/lung disease and anxiety and treatment options. Review healthy ways to manage with stress and anxiety.   Cardiac Rehab from 10/08/2017 in [facility] Cardiac and Pulmonary Rehab  Date  10/01/17  Educator  [facility]  Instruction Review Code  1- Verbalizes Understanding      Depression: - Provides group verbal and written instruction on the correlation between heart/lung disease and depressed mood, treatment options, and the stigmas associated with seeking treatment.   Anatomy & Physiology of the Heart: - Group verbal and written instruction and models provide  basic cardiac anatomy and physiology, with the coronary electrical and arterial systems. Review of Valvular disease and Heart Failure   Cardiac Rehab from 10/08/2017 in [facility] Cardiac and Pulmonary Rehab  Date  10/03/17  Educator  CE  Instruction Review Code  1- Verbalizes Understanding      Cardiac Procedures: - Group verbal and written instruction to review commonly prescribed medications for heart disease. Reviews the medication, class of the drug, and side effects. Includes the steps to properly store meds and maintain the prescription regimen. (beta blockers and nitrates)   Cardiac Rehab from 10/08/2017 in [facility] Cardiac  and Pulmonary Rehab  Date  07/30/17  Educator  SB  Instruction Review Code  1- Verbalizes Understanding      Cardiac Medications I: - Group verbal and written instruction to review commonly prescribed medications for heart disease. Reviews the medication, class of the drug, and side effects. Includes the steps to properly store meds and maintain the prescription regimen.   Cardiac Rehab from 10/08/2017 in [facility] Cardiac and Pulmonary Rehab  Date  09/24/17  Educator  SB  Instruction Review Code  1- Verbalizes Understanding      Cardiac Medications II: -Group verbal and written instruction to review commonly prescribed medications for heart disease. Reviews the medication, class of the drug, and side effects. (all other drug classes)   Cardiac Rehab from 10/08/2017 in [facility] Cardiac and Pulmonary Rehab  Date  09/19/17 [facility] Factors]  Educator  [facility]  Instruction Review Code  1- Verbalizes Understanding       Go Sex-Intimacy & Heart Disease, Get SMART - Goal Setting: - Group verbal and written instruction through game format to discuss heart disease and the return to sexual intimacy. Provides group verbal and written material to discuss and apply goal setting through the application of the S.M.A.R.T. Method.   Cardiac Rehab from 10/08/2017 in [facility] Cardiac and Pulmonary Rehab  Date  07/30/17  Educator  SB  Instruction Review Code  1- Verbalizes Understanding      Other Matters of the Heart: - Provides group verbal, written materials and models to describe Stable Angina and Peripheral Artery. Includes description of the disease process and treatment options available to the cardiac patient.   Cardiac Rehab from 10/08/2017 in [facility] Cardiac and Pulmonary Rehab  Date  10/03/17  Educator  CE  Instruction Review Code  1- Verbalizes Understanding      Exercise & Equipment Safety: - Individual verbal instruction and demonstration of equipment use and safety with use of the equipment.   Cardiac  Rehab from 10/27/2018 in [facility] Cardiac and Pulmonary Rehab  Date  10/27/18  Educator  SB  Instruction Review Code  1- Verbalizes Understanding      Infection Prevention: - Provides verbal and written material to individual with discussion of infection control including proper hand washing and proper equipment cleaning during exercise session.   Cardiac Rehab from 10/27/2018 in [facility] Cardiac and Pulmonary Rehab  Date  10/27/18  Educator  SB  Instruction Review Code  1- Verbalizes Understanding      Falls Prevention: - Provides verbal and written material to individual with discussion of falls prevention and safety.   Cardiac Rehab from 10/27/2018 in [facility] Cardiac and Pulmonary Rehab  Date  10/27/18  Educator  SB  Instruction Review Code  1- Verbalizes Understanding      Diabetes: - Individual verbal and written instruction to review signs/symptoms of diabetes, desired ranges of glucose level fasting, after meals and with exercise.  Acknowledge that pre and post exercise glucose checks will be done for 3 sessions at entry of program.   Cardiac Rehab from 10/08/2017 in [facility] Cardiac and Pulmonary Rehab  Date  07/18/17  Educator  Sb  Instruction Review Code  1- Verbalizes Understanding      Know Your Numbers and Risk Factors: -Group verbal and written instruction about important numbers in your health.  Discussion of what are risk factors and how they play a role in the disease process.  Review of Cholesterol, Blood Pressure, Diabetes, and BMI and the role they play in your overall health.   Cardiac Rehab from 10/08/2017 in [facility] Cardiac and Pulmonary Rehab  Date  09/19/17 [facility] Factors]  Educator  [facility]  Instruction Review Code  1- Verbalizes Understanding      Sleep Hygiene: -Provides group verbal and written instruction about how sleep can affect your health.  Define sleep hygiene, discuss sleep cycles and impact of sleep habits. Review good sleep hygiene tips.    Other: -Provides  group and verbal instruction on various topics (see comments)   Knowledge Questionnaire Score:   Core Components/Risk Factors/Patient Goals at Admission: Personal Goals and Risk Factors at Admission - 10/27/18 1221      Core Components/Risk Factors/Patient Goals on Admission    Weight Management  Yes;Weight Loss    Intervention  Weight Management: Develop a combined nutrition and exercise program designed to reach desired caloric intake, while maintaining appropriate intake of nutrient and fiber, sodium and fats, and appropriate energy expenditure required for the weight goal.;Weight Management/Obesity: Establish reasonable short term and long term weight goals.   HAs been losing weight on the Keto Diet   Admit Weight  208 lb (94.3 kg)    Goal Weight: Short Term  206 lb (93.4 kg)    Goal Weight: Long Term  200 lb (90.7 kg)    Expected Outcomes  Short Term: Continue to assess and modify interventions until short term weight is achieved;Long Term: Adherence to nutrition and physical activity/exercise program aimed toward attainment of established weight goal;Weight Loss: Understanding of general recommendations for a balanced deficit meal plan, which promotes 1-2 lb weight loss per week and includes a negative energy balance of [phone] kcal/d    Diabetes  Yes    Intervention  Provide education about signs/symptoms and action to take for hypo/hyperglycemia.;Provide education about proper nutrition, including hydration, and aerobic/resistive exercise prescription along with prescribed medications to achieve blood glucose in normal ranges: Fasting glucose 65-99 mg/dL    Expected Outcomes  Short Term: Participant verbalizes understanding of the signs/symptoms and immediate care of hyper/hypoglycemia, proper foot care and importance of medication, aerobic/resistive exercise and nutrition plan for blood glucose control.;Long Term: Attainment of HbA1C < 7%.    Hypertension  Yes    Intervention  Provide  education on lifestyle modifcations including regular physical activity/exercise, weight management, moderate sodium restriction and increased consumption of fresh fruit, vegetables, and low fat dairy, alcohol moderation, and smoking cessation.;Monitor prescription use compliance.    Expected Outcomes  Short Term: Continued assessment and intervention until BP is < 140/24m HG in hypertensive participants. < 130/891mHG in hypertensive participants with diabetes, heart failure or chronic kidney disease.;Long Term: Maintenance of blood pressure at goal levels.    Lipids  Yes    Intervention  Provide education and support for participant on nutrition & aerobic/resistive exercise along with prescribed medications to achieve LDL <7080mHDL >62m85m  Expected Outcomes  Short Term: Participant states  understanding of desired cholesterol values and is compliant with medications prescribed. Participant is following exercise prescription and nutrition guidelines.;Long Term: Cholesterol controlled with medications as prescribed, with individualized exercise RX and with personalized nutrition plan. Value goals: LDL < 70m, HDL > 40 mg.       Core Components/Risk Factors/Patient Goals Review:    Core Components/Risk Factors/Patient Goals at Discharge (Final Review):    ITP Comments: ITP Comments    Row Name 10/27/18 1206 11/05/18 0553         ITP Comments  Medical review completed today. INitial ITP sent to Dr MLoleta Chancefor review,changes as needed and signature. Documentation of diagnosis can be found in Care Everywhere1/28/2020  30 day review. Continue with ITP unless directed changes by Medical Director chart review. HAs attended orientation, wather and illness has delayed return.         Comments:

## 2018-11-07 ENCOUNTER — Encounter: Payer: Medicare HMO | Admitting: *Deleted

## 2018-11-07 DIAGNOSIS — Z955 Presence of coronary angioplasty implant and graft: Secondary | ICD-10-CM

## 2018-11-07 LAB — GLUCOSE, CAPILLARY
GLUCOSE-CAPILLARY: 164 mg/dL — AB (ref 70–99)
Glucose-Capillary: 211 mg/dL — ABNORMAL HIGH (ref 70–99)

## 2018-11-07 NOTE — Progress Notes (Signed)
Daily Session Note  Patient Details  Name: Tammy Hatfield MRN: [id number] Date of Birth: Jul 26, 1946 Referring Provider:     Cardiac Rehab from 10/27/2018 in [facility] Cardiac and Pulmonary Rehab  Referring Provider  [name] MD      Encounter Date: 11/07/2018  Check In: Session Check In - 11/07/18 0945      Check-In   Supervising physician immediately available to respond to emergencies  See telemetry face sheet for immediately available ER MD    Location  [facility]-Cardiac & Pulmonary Rehab    Staff Present  Renita Papa, RN BSN;Dean Wonder Luan Pulling, MA, RCEP, CCRP, Exercise Physiologist;Krista Frederico Hamman, RN BSN    Medication changes reported      No    Fall or balance concerns reported     No    Warm-up and Cool-down  Performed as group-led Higher education careers adviser Performed  Yes    VAD Patient?  No    PAD/SET Patient?  No      Pain Assessment   Currently in Pain?  No/denies          Social History   Tobacco Use  Smoking Status Former Smoker  . Packs/day: 2.00  . Years: 30.00  . Pack years: 60.00  . Last attempt to quit: 02/01/2000  . Years since quitting: 18.7  Smokeless Tobacco Never Used  Tobacco Comment   07/18/2017 former smoker quit 1999    Goals Met:  Exercise tolerated well Personal goals reviewed No report of cardiac concerns or symptoms Strength training completed today  Goals Unmet:  Not Applicable  Comments: First full day of exercise!  Patient was oriented to gym and equipment including functions, settings, policies, and procedures.  Patient's individual exercise prescription and treatment plan were reviewed.  All starting workloads were established based on the results of the 6 minute walk test done at initial orientation visit.  The plan for exercise progression was also introduced and progression will be customized based on patient's performance and goals.   Dr. Emily Filbert is Medical Director for [place] and  [facility].

## 2018-11-10 ENCOUNTER — Encounter: Payer: Medicare HMO | Attending: Internal Medicine | Admitting: *Deleted

## 2018-11-10 DIAGNOSIS — Z955 Presence of coronary angioplasty implant and graft: Secondary | ICD-10-CM

## 2018-11-10 LAB — GLUCOSE, CAPILLARY
GLUCOSE-CAPILLARY: 268 mg/dL — AB (ref 70–99)
Glucose-Capillary: 151 mg/dL — ABNORMAL HIGH (ref 70–99)

## 2018-11-10 NOTE — Progress Notes (Signed)
Daily Session Note  Patient Details  Name: Tammy Hatfield MRN: [id number] Date of Birth: 1946/07/26 Referring Provider:     Cardiac Rehab from 10/27/2018 in [facility] Cardiac and Pulmonary Rehab  Referring Provider  [name] MD      Encounter Date: 11/10/2018  Check In: Session Check In - 11/10/18 0755      Check-In   Supervising physician immediately available to respond to emergencies  See telemetry face sheet for immediately available ER MD    Location  [facility]-Cardiac & Pulmonary Rehab    Staff Present  Earlean Shawl, BS, ACSM CEP, Exercise Physiologist;Jessica Luan Pulling, MA, RCEP, CCRP, Exercise Physiologist;Susanne Bice, RN, BSN, CCRP    Medication changes reported      No    Fall or balance concerns reported     No    Tobacco Cessation  No Change    Warm-up and Cool-down  Performed as group-led instruction    Resistance Training Performed  Yes    VAD Patient?  No    PAD/SET Patient?  No      Pain Assessment   Currently in Pain?  No/denies    Multiple Pain Sites  No          Social History   Tobacco Use  Smoking Status Former Smoker  . Packs/day: 2.00  . Years: 30.00  . Pack years: 60.00  . Last attempt to quit: 02/01/2000  . Years since quitting: 18.7  Smokeless Tobacco Never Used  Tobacco Comment   07/18/2017 former smoker quit 1999    Goals Met:  Independence with exercise equipment Exercise tolerated well No report of cardiac concerns or symptoms Strength training completed today  Goals Unmet:  Not Applicable  Comments: Pt able to follow exercise prescription today without complaint.  Will continue to monitor for progression.    Dr. Emily Filbert is Medical Director for [place] and [facility].

## 2018-11-11 ENCOUNTER — Encounter: Payer: Medicare HMO | Admitting: *Deleted

## 2018-11-11 DIAGNOSIS — Z955 Presence of coronary angioplasty implant and graft: Secondary | ICD-10-CM

## 2018-11-11 LAB — GLUCOSE, CAPILLARY
Glucose-Capillary: 176 mg/dL — ABNORMAL HIGH (ref 70–99)
Glucose-Capillary: 126 mg/dL — ABNORMAL HIGH (ref 70–99)

## 2018-11-11 NOTE — Progress Notes (Signed)
Daily Session Note  Patient Details  Name: MELAINE MCPHEE MRN: [id number] Date of Birth: 21-Sep-1945 Referring Provider:     Cardiac Rehab from 10/27/2018 in [facility] Cardiac and Pulmonary Rehab  Referring Provider  [name] MD      Encounter Date: 11/11/2018  Check In: Session Check In - 11/11/18 0806      Check-In   Supervising physician immediately available to respond to emergencies  See telemetry face sheet for immediately available ER MD    Location  [facility]-Cardiac & Pulmonary Rehab    Staff Present  Heath Lark, RN, BSN, CCRP;Jeanna Durrell BS, Exercise Physiologist;Virgil Lightner Atkinson, MA, RCEP, CCRP, Exercise Physiologist    Medication changes reported      No    Fall or balance concerns reported     No    Warm-up and Cool-down  Performed as group-led instruction    Resistance Training Performed  Yes    VAD Patient?  No    PAD/SET Patient?  No      Pain Assessment   Currently in Pain?  No/denies          Social History   Tobacco Use  Smoking Status Former Smoker  . Packs/day: 2.00  . Years: 30.00  . Pack years: 60.00  . Last attempt to quit: 02/01/2000  . Years since quitting: 18.7  Smokeless Tobacco Never Used  Tobacco Comment   07/18/2017 former smoker quit 1999    Goals Met:  Independence with exercise equipment Exercise tolerated well No report of cardiac concerns or symptoms Strength training completed today  Goals Unmet:  Not Applicable  Comments: Pt able to follow exercise prescription today without complaint.  Will continue to monitor for progression.    Dr. Emily Filbert is Medical Director for [place] and [facility].

## 2018-11-14 ENCOUNTER — Encounter: Payer: Medicare HMO | Admitting: *Deleted

## 2018-11-14 DIAGNOSIS — Z955 Presence of coronary angioplasty implant and graft: Secondary | ICD-10-CM | POA: Diagnosis not present

## 2018-11-14 NOTE — Progress Notes (Addendum)
Daily Session Note  Patient Details  Name: Tammy Hatfield MRN: [id number] Date of Birth: 1946/03/13 Referring Provider:     Cardiac Rehab from 10/27/2018 in [facility] Cardiac and Pulmonary Rehab  Referring Provider  [name] MD      Encounter Date: 11/14/2018  Check In: Session Check In - 11/14/18 0850      Check-In   Supervising physician immediately available to respond to emergencies  See telemetry face sheet for immediately available ER MD    Location  [facility]-Cardiac & Pulmonary Rehab    Staff Present  Renita Papa, RN BSN;Caylor Tallarico Luan Pulling, MA, RCEP, CCRP, Exercise Physiologist;Amanda Oletta Darter, BA, ACSM CEP, Exercise Physiologist    Medication changes reported      Yes    Comments  D/c'd isosorbide.  Doubled Crestor    Fall or balance concerns reported     No    Warm-up and Cool-down  Performed as group-led Higher education careers adviser Performed  Yes    VAD Patient?  No    PAD/SET Patient?  No      Pain Assessment   Currently in Pain?  No/denies          Social History   Tobacco Use  Smoking Status Former Smoker  . Packs/day: 2.00  . Years: 30.00  . Pack years: 60.00  . Last attempt to quit: 02/01/2000  . Years since quitting: 18.7  Smokeless Tobacco Never Used  Tobacco Comment   07/18/2017 former smoker quit 1999    Goals Met:  Independence with exercise equipment Exercise tolerated well No report of cardiac concerns or symptoms Strength training completed today  Goals Unmet:  Not Applicable  Comments: Pt able to follow exercise prescription today without complaint.  Will continue to monitor for progression.    Dr. Emily Filbert is Medical Director for [place] and [facility].

## 2018-11-17 ENCOUNTER — Encounter: Payer: Medicare HMO | Admitting: *Deleted

## 2018-11-17 DIAGNOSIS — Z955 Presence of coronary angioplasty implant and graft: Secondary | ICD-10-CM | POA: Diagnosis not present

## 2018-11-17 NOTE — Progress Notes (Signed)
Daily Session Note  Patient Details  Name: Tammy Hatfield MRN: [id number] Date of Birth: 03-14-1946 Referring Provider:     Cardiac Rehab from 10/27/2018 in [facility] Cardiac and Pulmonary Rehab  Referring Provider  [name] MD      Encounter Date: 11/17/2018  Check In: Session Check In - 11/17/18 0755      Check-In   Supervising physician immediately available to respond to emergencies  See telemetry face sheet for immediately available ER MD    Location  [facility]-Cardiac & Pulmonary Rehab    Staff Present  Heath Lark, RN, BSN, CCRP;Lalitha Ilyas Spencer, MA, RCEP, CCRP, Exercise Physiologist;Kelly Amedeo Plenty, BS, ACSM CEP, Exercise Physiologist    Medication changes reported      No    Fall or balance concerns reported     No    Warm-up and Cool-down  Performed as group-led instruction    Resistance Training Performed  Yes    VAD Patient?  No    PAD/SET Patient?  No      Pain Assessment   Currently in Pain?  No/denies          Social History   Tobacco Use  Smoking Status Former Smoker  . Packs/day: 2.00  . Years: 30.00  . Pack years: 60.00  . Last attempt to quit: 02/01/2000  . Years since quitting: 18.8  Smokeless Tobacco Never Used  Tobacco Comment   07/18/2017 former smoker quit 1999    Goals Met:  Independence with exercise equipment Exercise tolerated well No report of cardiac concerns or symptoms Strength training completed today  Goals Unmet:  Not Applicable  Comments: Pt able to follow exercise prescription today without complaint.  Will continue to monitor for progression.    Dr. Emily Filbert is Medical Director for [place] and [facility].

## 2018-11-20 ENCOUNTER — Other Ambulatory Visit: Payer: Self-pay

## 2018-11-20 ENCOUNTER — Encounter: Payer: Medicare HMO | Admitting: *Deleted

## 2018-11-20 DIAGNOSIS — Z955 Presence of coronary angioplasty implant and graft: Secondary | ICD-10-CM

## 2018-11-20 NOTE — Progress Notes (Signed)
Daily Session Note  Patient Details  Name: Tammy Hatfield MRN: [id number] Date of Birth: November 02, 1945 Referring Provider:     Cardiac Rehab from 10/27/2018 in [facility] Cardiac and Pulmonary Rehab  Referring Provider  [name] MD      Encounter Date: 11/20/2018  Check In: Session Check In - 11/20/18 1019      Check-In   Supervising physician immediately available to respond to emergencies  See telemetry face sheet for immediately available ER MD    Location  [facility]-Cardiac & Pulmonary Rehab    Staff Present  Heath Lark, RN, BSN, CCRP;Jeanna Durrell BS, Exercise Physiologist;Jarl Sell[place], MA, RCEP, CCRP, Exercise Physiologist    Medication changes reported      No    Fall or balance concerns reported     No    Warm-up and Cool-down  Performed as group-led instruction    Resistance Training Performed  Yes    VAD Patient?  No    PAD/SET Patient?  No      Pain Assessment   Currently in Pain?  No/denies          Social History   Tobacco Use  Smoking Status Former Smoker  . Packs/day: 2.00  . Years: 30.00  . Pack years: 60.00  . Last attempt to quit: 02/01/2000  . Years since quitting: 18.8  Smokeless Tobacco Never Used  Tobacco Comment   07/18/2017 former smoker quit 1999    Goals Met:  Independence with exercise equipment Exercise tolerated well No report of cardiac concerns or symptoms Strength training completed today  Goals Unmet:  Not Applicable  Comments: Pt able to follow exercise prescription today without complaint.  Will continue to monitor for progression.    Dr. Emily Filbert is Medical Director for [place] and [facility].

## 2018-11-21 ENCOUNTER — Encounter: Payer: Medicare HMO | Admitting: *Deleted

## 2018-11-21 DIAGNOSIS — Z955 Presence of coronary angioplasty implant and graft: Secondary | ICD-10-CM | POA: Diagnosis not present

## 2018-11-21 NOTE — Progress Notes (Signed)
Daily Session Note  Patient Details  Name: Tammy Hatfield MRN: [id number] Date of Birth: 10-26-1945 Referring Provider:     Cardiac Rehab from 10/27/2018 in [facility] Cardiac and Pulmonary Rehab  Referring Provider  [name] MD      Encounter Date: 11/21/2018  Check In: Session Check In - 11/21/18 0804      Check-In   Supervising physician immediately available to respond to emergencies  See telemetry face sheet for immediately available ER MD    Location  [facility]-Cardiac & Pulmonary Rehab    Staff Present  Darel Hong, RN BSN;Jessica Luan Pulling, MA, RCEP, CCRP, Exercise Physiologist;Amanda Oletta Darter, [place], ACSM CEP, Exercise Physiologist    Medication changes reported      No    Fall or balance concerns reported     No    Warm-up and Cool-down  Performed as group-led instruction    Resistance Training Performed  Yes    VAD Patient?  No    PAD/SET Patient?  No      Pain Assessment   Currently in Pain?  No/denies          Social History   Tobacco Use  Smoking Status Former Smoker  . Packs/day: 2.00  . Years: 30.00  . Pack years: 60.00  . Last attempt to quit: 02/01/2000  . Years since quitting: 18.8  Smokeless Tobacco Never Used  Tobacco Comment   07/18/2017 former smoker quit 1999    Goals Met:  Independence with exercise equipment Exercise tolerated well Personal goals reviewed No report of cardiac concerns or symptoms Strength training completed today  Goals Unmet:  Not Applicable  Comments: Pt able to follow exercise prescription today without complaint.  Will continue to monitor for progression. Reviewed home exercise with pt today.  Pt plans to walk and use treadmill and recumbent bike at home for exercise.  Reviewed THR, pulse, RPE, sign and symptoms, NTG use, and when to call 911 or MD.  Also discussed weather considerations and indoor options.  Pt voiced understanding.    Dr. Emily Filbert is Medical Director for [place] and  [facility].

## 2018-12-03 ENCOUNTER — Encounter: Payer: Self-pay | Admitting: *Deleted

## 2018-12-03 DIAGNOSIS — Z955 Presence of coronary angioplasty implant and graft: Secondary | ICD-10-CM

## 2018-12-03 NOTE — Progress Notes (Signed)
Cardiac Individual Treatment Plan  Patient Details  Name: Tammy Hatfield MRN: [id number] Date of Birth: Dec 17, 1945 Referring Provider:     Cardiac Rehab from 10/27/2018 in [facility] Cardiac and Pulmonary Rehab  Referring Provider  [name] MD      Initial Encounter Date:    Cardiac Rehab from 10/27/2018 in [facility] Cardiac and Pulmonary Rehab  Date  10/27/18      Visit Diagnosis: Status post coronary artery stent placement  Patient's Home Medications on Admission:  Current Outpatient Medications:    aspirin EC 81 MG tablet, Take 81 mg by mouth daily., Disp: , Rfl:    Carboxymethylcellul-Glycerin (LUBRICATING EYE DROPS OP), Place 1 drop into both eyes 2 (two) times daily., Disp: , Rfl:    clopidogrel (PLAVIX) 75 MG tablet, Take 75 mg by mouth every evening. , Disp: , Rfl:    glipiZIDE (GLUCOTROL) 10 MG tablet, Take 10 mg by mouth 2 (two) times daily. , Disp: , Rfl:    ibuprofen (ADVIL,MOTRIN) 200 MG tablet, Take 400 mg by mouth daily as needed for headache or moderate pain., Disp: , Rfl:    insulin NPH-regular Human (NOVOLIN 70/30) (70-30) 100 UNIT/ML injection, Inject 25 Units into the skin 2 (two) times daily with a meal. , Disp: , Rfl:    isosorbide mononitrate (IMDUR) 120 MG 24 hr tablet, Take 120 mg by mouth daily., Disp: , Rfl:    losartan (COZAAR) 50 MG tablet, Take 50 mg by mouth every morning. , Disp: , Rfl:    metoprolol succinate (TOPROL-XL) 25 MG 24 hr tablet, Take 25 mg by mouth daily. , Disp: , Rfl:    nitroGLYCERIN (NITROSTAT) 0.4 MG SL tablet, Place 1 tablet (0.4 mg total) under the tongue every 5 (five) minutes as needed for chest pain., Disp: 30 tablet, Rfl: 0   pantoprazole (PROTONIX) 20 MG tablet, Take 20 mg by mouth daily., Disp: , Rfl:    PARoxetine (PAXIL) 40 MG tablet, Take 40 mg by mouth 2 (two) times daily. , Disp: , Rfl:    rosuvastatin (CRESTOR) 20 MG tablet, Take 20 mg by mouth every evening., Disp: , Rfl:    traMADol (ULTRAM) 50 MG  tablet, Take 50-100 mg by mouth 2 (two) times daily as needed for moderate pain. , Disp: , Rfl:    valACYclovir (VALTREX) 500 MG tablet, Take 500 mg by mouth 2 (two) times daily as needed (fever blisters). , Disp: , Rfl:   Past Medical History: Past Medical History:  Diagnosis Date   Anginal pain ([place])    Anxiety    Arthritis    knees   Barrett's esophagus    Breast cancer ([place]) 2014   RT LUMPECTOMY   Cancer ([place]) 2014   right breast - s\p Radiation and lumpectomy and LN dissection   Chronic sinusitis    Complication of anesthesia    hard to wake after Fentanyl   COPD (chronic obstructive pulmonary disease) ([place]) 2012   Coronary artery disease    Degenerative lumbar disc    Diabetes mellitus without complication (HCC)    Dysphagia    Esophageal reflux    Fatty liver    History of Helicobacter pylori infection    Hypercholesteremia    Hyperlipidemia    Hypertension    Personal history of radiation therapy    Pneumonia    Radiation 2014   RT BREAST CA   Sleep apnea    CPAP   Ulcer, stomach peptic    history of  Vertigo    last episode 2017(approx)    Tobacco Use: Social History   Tobacco Use  Smoking Status Former Smoker   Packs/day: 2.00   Years: 30.00   Pack years: 60.00   Last attempt to quit: 02/01/2000   Years since quitting: 18.8  Smokeless Tobacco Never Used  Tobacco Comment   07/18/2017 former smoker quit 1999    Labs: Recent Review Scientist, physiological    Labs for ITP Cardiac and Pulmonary Rehab Latest Ref Rng & Units 02/11/2015 07/05/2017   Cholestrol 0 - 200 mg/dL - 255(H)   LDLCALC 0 - 99 mg/dL - 147(H)   HDL >40 mg/dL - 62   Trlycerides <150 mg/dL - 231(H)   Hemoglobin A1c 4.0 - 6.0 % 7.4(H) -       Exercise Target Goals: Exercise Program Goal: Individual exercise prescription set using results from initial 6 min walk test and THRR while considering  patients activity barriers and safety.   Exercise Prescription  Goal: Initial exercise prescription builds to 30-45 minutes a day of aerobic activity, 2-3 days per week.  Home exercise guidelines will be given to patient during program as part of exercise prescription that the participant will acknowledge.  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 HR  116 bpm     Max Ex. BP  138/64     2 Minute Post BP  126/56        Oxygen Initial Assessment:   Oxygen Re-Evaluation:   Oxygen Discharge (Final Oxygen Re-Evaluation):   Initial Exercise Prescription: Initial Exercise Prescription - 10/27/18 1500      Date of Initial Exercise RX and Referring Provider   Date  10/27/18    Referring Provider  [name] MD      Treadmill   MPH  1.5    Grade  0.5    Minutes  15    METs  2.25      NuStep   Level  2    SPM  80    Minutes  15    METs  2.2      Arm Ergometer   Level  2    Watts  34    RPM  30    Minutes  15    METs  2.2      Prescription  Details   Frequency (times per week)  3    Duration  Progress to 45 minutes of aerobic exercise without signs/symptoms of physical distress      Intensity   THRR 40-80% of Max Heartrate  98-131    Ratings of Perceived Exertion  11-13     Perceived Dyspnea  0-4      Progression   Progression  Continue to progress workloads to maintain intensity without signs/symptoms of physical distress.      Resistance Training   Training Prescription  Yes    Weight  3 lbs    Reps  10-15       Perform Capillary Blood Glucose checks as needed.  Exercise Prescription Changes: Exercise Prescription Changes    Row Name 10/27/18 1200 11/11/18 1600 11/21/18 0800 11/25/18 1400       Response to Exercise   Blood Pressure (Admit)  130/74  122/62  --  124/70    Blood Pressure (Exercise)  138/64  134/72  --  120/82    Blood Pressure (Exit)  126/56  90/48  --  104/74    Heart Rate (Admit)  65 bpm  79 bpm  --  99 bpm    Heart Rate (Exercise)  116 bpm  95 bpm  --  103 bpm    Heart Rate (Exit)  71 bpm  75 bpm  --  90 bpm    Oxygen Saturation (Admit)  97 %  --  --  --    Oxygen Saturation (Exercise)  94 %  --  --  --    Rating of Perceived Exertion (Exercise)  12  13  --  13    Symptoms  hip pain 5/10, knee pain 4/10  none  --  none    Comments  walk test results  --  --  --    Duration  --  Progress to 45 minutes of aerobic exercise without signs/symptoms of physical distress  --  Continue with 45 min of aerobic exercise without signs/symptoms of physical distress.    Intensity  --  THRR unchanged  --  THRR unchanged      Progression   Progression  --  Continue to progress workloads to maintain intensity without signs/symptoms of physical distress.  --  Continue to progress workloads to maintain intensity without signs/symptoms of physical distress.    Average METs  --  2.12  --  2.77      Resistance Training   Training Prescription  --  Yes  --  Yes    Weight  --  3 lbs  --  3 lbs    Reps  --  10-15  --  10-15      Interval Training   Interval Training  --  No  --  No      Treadmill   MPH  --  1.5  --  2    Grade  --  0.5  --  1    Minutes  --  15  --  15    METs  --  2.25  --  2.81      NuStep   Level  --  2  --  2     Minutes  --  15  --  15    METs  --  --  --  3.4      Arm Ergometer   Level  --  2  --  2    Minutes  --  15  --  15    METs  --  2  --  2.1      Home Exercise Plan   Plans to continue exercise at  --  --  Home (comment) walking, treadmill, recumbent bike  Home (comment) walking, treadmill, recumbent bike    Frequency  --  --  Add 2 additional days to program exercise sessions.  Add 2 additional days to program exercise sessions.    Initial Home Exercises Provided  --  --  11/21/18  11/21/18       Exercise Comments: Exercise Comments    Row Name 11/07/18 [phone]           Exercise Comments   First full day of exercise!  Patient was oriented to gym and equipment including functions, settings, policies, and procedures.  Patient's individual exercise prescription and treatment plan were reviewed.  All starting workloads were established based on the results of the 6 minute walk test done at initial orientation visit.  The plan for exercise progression was also introduced and progression will be customized based on patient's performance and goals.          Exercise Goals and Review: Exercise Goals    Row Name 10/27/18 1522             Exercise Goals   Increase Physical Activity  Yes       Intervention  Provide advice, education, support and counseling about physical activity/exercise needs.;Develop an individualized exercise prescription for aerobic and resistive training based on initial evaluation findings, risk stratification, comorbidities and participant's personal goals.       Expected Outcomes  Short Term: Attend rehab on a regular basis to increase amount of physical activity.;Long Term: Add in home exercise to make exercise part of routine and to increase amount of physical activity.;Long Term: Exercising regularly at least 3-5 days a week.       Increase Strength and Stamina  Yes       Intervention  Provide advice, education, support and counseling about physical  activity/exercise needs.;Develop an individualized exercise prescription for aerobic and resistive training based on initial evaluation findings, risk stratification, comorbidities and participant's personal goals.       Expected Outcomes  Short Term: Increase workloads from initial exercise prescription for resistance, speed, and METs.;Short Term: Perform resistance training exercises routinely during rehab and add in resistance training at home;Long Term: Improve cardiorespiratory fitness, muscular endurance and strength as measured by increased METs and functional capacity (6MWT)       Able to understand and use rate of perceived exertion (RPE) scale  Yes       Intervention  Provide education and explanation on how to use RPE scale       Expected Outcomes  Short Term: Able to use RPE daily in rehab to express subjective intensity level;Long Term:  Able to use RPE to guide intensity level when exercising independently       Able to understand and use Dyspnea scale  Yes       Intervention  Provide education and explanation on how to use Dyspnea scale       Expected Outcomes  Short Term: Able to use Dyspnea scale daily in rehab to express subjective sense of shortness of breath during exertion;Long Term: Able to use Dyspnea scale to guide intensity level when exercising independently       Knowledge and understanding of Target Heart Rate Range (THRR)  Yes  Intervention  Provide education and explanation of THRR including how the numbers were predicted and where they are located for reference       Expected Outcomes  Short Term: Able to state/look up THRR;Long Term: Able to use THRR to govern intensity when exercising independently;Short Term: Able to use daily as guideline for intensity in rehab       Able to check pulse independently  Yes       Intervention  Provide education and demonstration on how to check pulse in carotid and radial arteries.;Review the importance of being able to check your  own pulse for safety during independent exercise       Expected Outcomes  Short Term: Able to explain why pulse checking is important during independent exercise;Long Term: Able to check pulse independently and accurately       Understanding of Exercise Prescription  Yes       Intervention  Provide education, explanation, and written materials on patient's individual exercise prescription       Expected Outcomes  Short Term: Able to explain program exercise prescription;Long Term: Able to explain home exercise prescription to exercise independently          Exercise Goals Re-Evaluation : Exercise Goals Re-Evaluation    Row Name 11/07/18 0946 11/11/18 1615 11/21/18 0821 11/25/18 1429       Exercise Goal Re-Evaluation   Exercise Goals Review  Increase Physical Activity;Increase Strength and Stamina;Able to understand and use rate of perceived exertion (RPE) scale;Knowledge and understanding of Target Heart Rate Range (THRR);Understanding of Exercise Prescription  Increase Physical Activity;Increase Strength and Stamina;Understanding of Exercise Prescription  Increase Physical Activity;Increase Strength and Stamina;Understanding of Exercise Prescription  Increase Physical Activity;Increase Strength and Stamina;Understanding of Exercise Prescription    Comments  Reviewed RPE scale, THR and program prescription with pt today.  Pt voiced understanding and was given a copy of goals to take home.   [name] is off to another good start in rehab.  She even came to class today since she will be missing tomorrow!  She is already on level 2 for the arm crank!  We will continue to monitor her progression.   [name] is doing well in rehab.  She is starting to feeling stronger and has more stamina.  Reviewed home exercise with pt today.  Pt plans to walk and use treadmill and recumbent bike at home for exercise.  Reviewed THR, pulse, RPE, sign and symptoms, NTG use, and when to call 911 or MD.  Also discussed weather  considerations and indoor options.  Pt voiced understanding.  [name] continues to do well in rehab.  She is planning to go to her gym in complex with her exercise packet.  We will continue to monitor her progress at home.     Expected Outcomes  Short: Use RPE daily to regulate intensity. Long: Follow program prescription in THR.  Short: Attend class regularly.  Long: Continue to increase strength and stamina.   Short: Start to add home exercise back into routine.  Long: Continue to exercise more independently.   Short: Continue to exercise.  Long: Continue to increase strength and stamina.        Discharge Exercise Prescription (Final Exercise Prescription Changes): Exercise Prescription Changes - 11/25/18 1400      Response to Exercise   Blood Pressure (Admit)  124/70    Blood Pressure (Exercise)  120/82    Blood Pressure (Exit)  104/74    Heart Rate (Admit)  99 bpm  Heart Rate (Exercise)  103 bpm    Heart Rate (Exit)  90 bpm    Rating of Perceived Exertion (Exercise)  13    Symptoms  none    Duration  Continue with 45 min of aerobic exercise without signs/symptoms of physical distress.    Intensity  THRR unchanged      Progression   Progression  Continue to progress workloads to maintain intensity without signs/symptoms of physical distress.    Average METs  2.77      Resistance Training   Training Prescription  Yes    Weight  3 lbs    Reps  10-15      Interval Training   Interval Training  No      Treadmill   MPH  2    Grade  1    Minutes  15    METs  2.81      NuStep   Level  2    Minutes  15    METs  3.4      Arm Ergometer   Level  2    Minutes  15    METs  2.1      Home Exercise Plan   Plans to continue exercise at  Home (comment)   walking, treadmill, recumbent bike   Frequency  Add 2 additional days to program exercise sessions.    Initial Home Exercises Provided  11/21/18       Nutrition:  Target Goals: Understanding of nutrition guidelines, daily  intake of sodium <1578m, cholesterol <2064m calories 30% from fat and 7% or less from saturated fats, daily to have 5 or more servings of fruits and vegetables.  Biometrics: Pre Biometrics - 10/27/18 1523      Pre Biometrics   Height  5' 4.9" (1.648 m)    Weight  208 lb 11.2 oz (94.7 kg)    Waist Circumference  40 inches    Hip Circumference  46 inches    Waist to Hip Ratio  0.87 %    BMI (Calculated)  34.86    Single Leg Stand  5.64 seconds        Nutrition Therapy Plan and Nutrition Goals: Nutrition Therapy & Goals - 10/27/18 1220      Intervention Plan   Intervention  Prescribe, educate and counsel regarding individualized specific dietary modifications aiming towards targeted core components such as weight, hypertension, lipid management, diabetes, heart failure and other comorbidities.    Expected Outcomes  Short Term Goal: Understand basic principles of dietary content, such as calories, fat, sodium, cholesterol and nutrients.;Short Term Goal: A plan has been developed with personal nutrition goals set during dietitian appointment.;Long Term Goal: Adherence to prescribed nutrition plan.       Nutrition Assessments: Nutrition Assessments - 11/07/18 0947      MEDFICTS Scores   Pre Score  86       Nutrition Goals Re-Evaluation: Nutrition Goals Re-Evaluation    Row Name 11/21/18 0825             Goals   Nutrition Goal  Change diet to heart healthy       Comment  AnLelon Frohlichas been following the keto diet.  We talked about how this is not the best for heart patients.  She is going start to transistion.  She will try leaner meats and more fruits and vegetables. Sent EMMI education for diet and diabetes to review.        Expected Outcome  Short: Meet  with Melissa about diet and changes to make.  Reviewe EMMI education. Long: Transition to heart healthy diet.           Nutrition Goals Discharge (Final Nutrition Goals Re-Evaluation): Nutrition Goals Re-Evaluation - 11/21/18  0825      Goals   Nutrition Goal  Change diet to heart healthy    Comment  [name] has been following the keto diet.  We talked about how this is not the best for heart patients.  She is going start to transistion.  She will try leaner meats and more fruits and vegetables. Sent EMMI education for diet and diabetes to review.     Expected Outcome  Short: Meet with Melissa about diet and changes to make.  Reviewe EMMI education. Long: Transition to heart healthy diet.        Psychosocial: Target Goals: Acknowledge presence or absence of significant depression and/or stress, maximize coping skills, provide positive support system. Participant is able to verbalize types and ability to use techniques and skills needed for reducing stress and depression.   Initial Review & Psychosocial Screening: Initial Psych Review & Screening - 10/27/18 11-08-1216      Initial Review   Current issues with  None Identified      Family Dynamics   Good Support System?  Yes   FAmily   Comments  Husband passed away 11/08/16. States is doing well today, is active at [employer].       Barriers   Psychosocial barriers to participate in program  There are no identifiable barriers or psychosocial needs.;The patient should benefit from training in stress management and relaxation.      Screening Interventions   Interventions  Encouraged to exercise;To provide support and resources with identified psychosocial needs;Provide feedback about the scores to participant    Expected Outcomes  Short Term goal: Utilizing psychosocial counselor, staff and physician to assist with identification of specific Stressors or current issues interfering with healing process. Setting desired goal for each stressor or current issue identified.;Long Term Goal: Stressors or current issues are controlled or eliminated.;Short Term goal: Identification and review with participant of any Quality of Life or Depression concerns found by scoring the  questionnaire.;Long Term goal: The participant improves quality of Life and PHQ9 Scores as seen by post scores and/or verbalization of changes       Quality of Life Scores:  Quality of Life - 11/07/18 0948      Quality of Life   Select  Quality of Life      Quality of Life Scores   Health/Function Pre  22.11 %    Socioeconomic Pre  21.67 %    Psych/Spiritual Pre  14.2 %    Family Pre  20 %    GLOBAL Pre  20.35 %      Scores of 19 and below usually indicate a poorer quality of life in these areas.  A difference of  2-3 points is a clinically meaningful difference.  A difference of 2-3 points in the total score of the Quality of Life Index has been associated with significant improvement in overall quality of life, self-image, physical symptoms, and general health in studies assessing change in quality of life.  PHQ-9: Recent Review Flowsheet Data    Depression screen [facility] 2/9 10/27/2018 09/19/2017 07/18/2017   Decreased Interest 0 0 0   Down, Depressed, Hopeless 1 1 0   PHQ - 2 Score 1 1 0   Altered sleeping 0 1 1  Tired, decreased energy 0 1 3   Change in appetite 0 1 2   Feeling bad or failure about yourself  0 0 0   Trouble concentrating 0 0 0   Moving slowly or fidgety/restless 0 1 0   Suicidal thoughts 0 0 0   PHQ-9 Score _0 Difficult doing work/chores Not difficult at all - Not difficult at all     Interpretation of Total Score  Total Score Depression Severity:  1-4 = Minimal depression, 5-9 = Mild depression, 10-14 = Moderate depression, 15-19 = Moderately severe depression, 20-27 = Severe depression   Psychosocial Evaluation and Intervention: Psychosocial Evaluation - 11/26/18 1631      Psychosocial Evaluation & Interventions   Interventions  Encouraged to exercise with the program and follow exercise prescription    Comments  counselor Learta Codding charting in [place] H's session: counselor called client to complete initial assessment.  She spoke of past  participation in program and how since she has had more stents put in she is back in the program.  She expressed gratitude for her son's involvement in her life and how she can see his apartment from her balcony.  No concerns to report regarding sleep or appetite.  Patient stated that she does not have any additional health concerns to report.  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  Since his death, she sees purpose in sharing her experiences with others.    Expected Outcomes  Short term goal: lose weight; Long term goal: determine manageable self-care and find balance in her life    Continue Psychosocial Services   Follow up required by staff       Psychosocial Re-Evaluation: Psychosocial Re-Evaluation    Daisy Name 11/21/18 0821             Psychosocial Re-Evaluation   Current issues with  Current Stress Concerns       Comments  [name] is doing well mentally.  She is in a good place.  She is sleeping well despite her sleep apnea and doesn't wear her CPAP.  She has not had any symptoms of depression and paper work from her husband is in order now.        Expected Outcomes  Short: Start to add in more exercise at home for mental health.  Long: Continue to stay positive.        Interventions  Encouraged to attend Cardiac Rehabilitation for the exercise       Continue Psychosocial Services   Follow up required by staff          Psychosocial Discharge (Final Psychosocial Re-Evaluation): Psychosocial Re-Evaluation - 11/21/18 0821      Psychosocial Re-Evaluation   Current issues with  Current Stress Concerns    Comments  [name] is doing well mentally.  She is in a good place.  She is sleeping well despite her sleep apnea and doesn't wear her CPAP.  She has not had any symptoms of depression and paper work from her husband is in order now.     Expected Outcomes  Short: Start to add in more exercise at home for mental health.  Long: Continue to  stay positive.     Interventions  Encouraged to attend Cardiac Rehabilitation for the exercise    Continue Psychosocial Services   Follow up required by staff       Vocational Rehabilitation: Provide vocational rehab assistance to qualifying  candidates.   Vocational Rehab Evaluation & Intervention: Vocational Rehab - 10/27/18 1221      Initial Vocational Rehab Evaluation & Intervention   Assessment shows need for Vocational Rehabilitation  No       Education: Education Goals: Education classes will be provided on a variety of topics geared toward better understanding of heart health and risk factor modification. Participant will state understanding/return demonstration of topics presented as noted by education test scores.  Learning Barriers/Preferences: Learning Barriers/Preferences - 10/27/18 1220      Learning Barriers/Preferences   Learning Barriers  None    Learning Preferences  Skilled Demonstration   Likes to learn with hands on      Education Topics:  AED/CPR: - Group verbal and written instruction with the use of models to demonstrate the basic use of the AED with the basic ABC's of resuscitation.   General Nutrition Guidelines/Fats and Fiber: -Group instruction provided by verbal, written material, models and posters to present the general guidelines for heart healthy nutrition. Gives an explanation and review of dietary fats and fiber.   Cardiac Rehab from 11/17/2018 in [facility] Cardiac and Pulmonary Rehab  Date  11/17/18  Educator  [facility]  Instruction Review Code  1- Verbalizes Understanding      Controlling Sodium/Reading Food Labels: -Group verbal and written material supporting the discussion of sodium use in heart healthy nutrition. Review and explanation with models, verbal and written materials for utilization of the food label.   Cardiac Rehab from 10/08/2017 in [facility] Cardiac and Pulmonary Rehab  Date  08/27/17  Educator  CR  Instruction Review Code  1-  Verbalizes Understanding      Exercise Physiology & General Exercise Guidelines: - Group verbal and written instruction with models to review the exercise physiology of the cardiovascular system and associated critical values. Provides general exercise guidelines with specific guidelines to those with heart or lung disease.    Cardiac Rehab from 10/08/2017 in [facility] Cardiac and Pulmonary Rehab  Date  09/05/17  Educator  [facility]  Instruction Review Code  1- Verbalizes Understanding      Aerobic Exercise & Resistance Training: - Gives group verbal and written instruction on the various components of exercise. Focuses on aerobic and resistive training programs and the benefits of this training and how to safely progress through these programs..   Cardiac Rehab from 10/08/2017 in [facility] Cardiac and Pulmonary Rehab  Date  09/12/17  Educator  [facility]  Instruction Review Code  1- Verbalizes Understanding      Flexibility, Balance, Mind/Body Relaxation: Provides group verbal/written instruction on the benefits of flexibility and balance training, including mind/body exercise modes such as yoga, pilates and tai chi.  Demonstration and skill practice provided.   Cardiac Rehab from 10/08/2017 in [facility] Cardiac and Pulmonary Rehab  Date  09/17/17  Educator  AS  Instruction Review Code  1- Verbalizes Understanding      Stress and Anxiety: - Provides group verbal and written instruction about the health risks of elevated stress and causes of high stress.  Discuss the correlation between heart/lung disease and anxiety and treatment options. Review healthy ways to manage with stress and anxiety.   Cardiac Rehab from 10/08/2017 in [facility] Cardiac and Pulmonary Rehab  Date  10/01/17  Educator  [facility]  Instruction Review Code  1- Verbalizes Understanding      Depression: - Provides group verbal and written instruction on the correlation between heart/lung disease and depressed mood, treatment options, and the stigmas  associated with  seeking treatment.   Cardiac Rehab from 11/17/2018 in [facility] Cardiac and Pulmonary Rehab  Date  11/11/18  Educator  [facility]  Instruction Review Code  5- Refused Teaching [had class previously]      Anatomy & Physiology of the Heart: - Group verbal and written instruction and models provide basic cardiac anatomy and physiology, with the coronary electrical and arterial systems. Review of Valvular disease and Heart Failure   Cardiac Rehab from 10/08/2017 in [facility] Cardiac and Pulmonary Rehab  Date  10/03/17  Educator  CE  Instruction Review Code  1- Verbalizes Understanding      Cardiac Procedures: - Group verbal and written instruction to review commonly prescribed medications for heart disease. Reviews the medication, class of the drug, and side effects. Includes the steps to properly store meds and maintain the prescription regimen. (beta blockers and nitrates)   Cardiac Rehab from 10/08/2017 in [facility] Cardiac and Pulmonary Rehab  Date  07/30/17  Educator  SB  Instruction Review Code  1- Verbalizes Understanding      Cardiac Medications I: - Group verbal and written instruction to review commonly prescribed medications for heart disease. Reviews the medication, class of the drug, and side effects. Includes the steps to properly store meds and maintain the prescription regimen.   Cardiac Rehab from 10/08/2017 in [facility] Cardiac and Pulmonary Rehab  Date  09/24/17  Educator  SB  Instruction Review Code  1- Verbalizes Understanding      Cardiac Medications II: -Group verbal and written instruction to review commonly prescribed medications for heart disease. Reviews the medication, class of the drug, and side effects. (all other drug classes)   Cardiac Rehab from 11/17/2018 in [facility] Cardiac and Pulmonary Rehab  Date  11/10/18  Educator  SB  Instruction Review Code  1- Verbalizes Understanding       Go Sex-Intimacy & Heart Disease, Get SMART - Goal Setting: - Group verbal and  written instruction through game format to discuss heart disease and the return to sexual intimacy. Provides group verbal and written material to discuss and apply goal setting through the application of the S.M.A.R.T. Method.   Cardiac Rehab from 10/08/2017 in [facility] Cardiac and Pulmonary Rehab  Date  07/30/17  Educator  SB  Instruction Review Code  1- Verbalizes Understanding      Other Matters of the Heart: - Provides group verbal, written materials and models to describe Stable Angina and Peripheral Artery. Includes description of the disease process and treatment options available to the cardiac patient.   Cardiac Rehab from 10/08/2017 in [facility] Cardiac and Pulmonary Rehab  Date  10/03/17  Educator  CE  Instruction Review Code  1- Verbalizes Understanding      Exercise & Equipment Safety: - Individual verbal instruction and demonstration of equipment use and safety with use of the equipment.   Cardiac Rehab from 11/17/2018 in [facility] Cardiac and Pulmonary Rehab  Date  10/27/18  Educator  SB  Instruction Review Code  1- Verbalizes Understanding      Infection Prevention: - Provides verbal and written material to individual with discussion of infection control including proper hand washing and proper equipment cleaning during exercise session.   Cardiac Rehab from 11/17/2018 in [facility] Cardiac and Pulmonary Rehab  Date  10/27/18  Educator  SB  Instruction Review Code  1- Verbalizes Understanding      Falls Prevention: - Provides verbal and written material to individual with discussion of falls prevention and safety.   Cardiac Rehab from 11/17/2018  in [facility] Cardiac and Pulmonary Rehab  Date  10/27/18  Educator  SB  Instruction Review Code  1- Verbalizes Understanding      Diabetes: - Individual verbal and written instruction to review signs/symptoms of diabetes, desired ranges of glucose level fasting, after meals and with exercise. Acknowledge that pre and post exercise glucose checks  will be done for 3 sessions at entry of program.   Cardiac Rehab from 10/08/2017 in [facility] Cardiac and Pulmonary Rehab  Date  07/18/17  Educator  Sb  Instruction Review Code  1- Verbalizes Understanding      Know Your Numbers and Risk Factors: -Group verbal and written instruction about important numbers in your health.  Discussion of what are risk factors and how they play a role in the disease process.  Review of Cholesterol, Blood Pressure, Diabetes, and BMI and the role they play in your overall health.   Cardiac Rehab from 11/17/2018 in [facility] Cardiac and Pulmonary Rehab  Date  11/10/18  Educator  SB  Instruction Review Code  1- Verbalizes Understanding      Sleep Hygiene: -Provides group verbal and written instruction about how sleep can affect your health.  Define sleep hygiene, discuss sleep cycles and impact of sleep habits. Review good sleep hygiene tips.    Other: -Provides group and verbal instruction on various topics (see comments)   Knowledge Questionnaire Score: Knowledge Questionnaire Score - 11/07/18 0946      Knowledge Questionnaire Score   Pre Score  25/26   test reviewed with patient today      Core Components/Risk Factors/Patient Goals at Admission: Personal Goals and Risk Factors at Admission - 10/27/18 1221      Core Components/Risk Factors/Patient Goals on Admission    Weight Management  Yes;Weight Loss    Intervention  Weight Management: Develop a combined nutrition and exercise program designed to reach desired caloric intake, while maintaining appropriate intake of nutrient and fiber, sodium and fats, and appropriate energy expenditure required for the weight goal.;Weight Management/Obesity: Establish reasonable short term and long term weight goals.   HAs been losing weight on the Keto Diet   Admit Weight  208 lb (94.3 kg)    Goal Weight: Short Term  206 lb (93.4 kg)    Goal Weight: Long Term  200 lb (90.7 kg)    Expected Outcomes  Short Term:  Continue to assess and modify interventions until short term weight is achieved;Long Term: Adherence to nutrition and physical activity/exercise program aimed toward attainment of established weight goal;Weight Loss: Understanding of general recommendations for a balanced deficit meal plan, which promotes 1-2 lb weight loss per week and includes a negative energy balance of [phone] kcal/d    Diabetes  Yes    Intervention  Provide education about signs/symptoms and action to take for hypo/hyperglycemia.;Provide education about proper nutrition, including hydration, and aerobic/resistive exercise prescription along with prescribed medications to achieve blood glucose in normal ranges: Fasting glucose 65-99 mg/dL    Expected Outcomes  Short Term: Participant verbalizes understanding of the signs/symptoms and immediate care of hyper/hypoglycemia, proper foot care and importance of medication, aerobic/resistive exercise and nutrition plan for blood glucose control.;Long Term: Attainment of HbA1C < 7%.    Hypertension  Yes    Intervention  Provide education on lifestyle modifcations including regular physical activity/exercise, weight management, moderate sodium restriction and increased consumption of fresh fruit, vegetables, and low fat dairy, alcohol moderation, and smoking cessation.;Monitor prescription use compliance.    Expected Outcomes  Short  Term: Continued assessment and intervention until BP is < 140/25m HG in hypertensive participants. < 130/863mHG in hypertensive participants with diabetes, heart failure or chronic kidney disease.;Long Term: Maintenance of blood pressure at goal levels.    Lipids  Yes    Intervention  Provide education and support for participant on nutrition & aerobic/resistive exercise along with prescribed medications to achieve LDL <7043mHDL >20m56m  Expected Outcomes  Short Term: Participant states understanding of desired cholesterol values and is compliant with  medications prescribed. Participant is following exercise prescription and nutrition guidelines.;Long Term: Cholesterol controlled with medications as prescribed, with individualized exercise RX and with personalized nutrition plan. Value goals: LDL < 70mg71mL > 40 mg.       Core Components/Risk Factors/Patient Goals Review:  Goals and Risk Factor Review    Row Name 11/21/18 0832 [phone]        Core Components/Risk Factors/Patient Goals Review   Personal Goals Review  Diabetes;Hypertension;Heart Failure;Weight Management/Obesity;Lipids       Review  [name] hLelon Frohlichbeen doing well in rehab.  She is stuck at 210 lbs.  She is breathing better and has more energy.   She is going to work on adding in more exercise and changing up her diet to help with weight loss.  She denies any heart failure symptoms currently.  Her blood pressures have been good and she checks them on occasion at home.  She will start to check them once a day.  Her sugars are still not great and she does check them twice a day.  She will talk with Melissa for some guideance. Overall, she is doing well on her medications. Sent EMMI edcuation on diabetes for her to review as well.        Expected Outcomes  Short: Check blood pressures more frequently. Complete EMMI education for diabetes.  Long: Continue to work on weight loss.           Core Components/Risk Factors/Patient Goals at Discharge (Final Review):  Goals and Risk Factor Review - 11/21/18 0832      Core Components/Risk Factors/Patient Goals Review   Personal Goals Review  Diabetes;Hypertension;Heart Failure;Weight Management/Obesity;Lipids    Review  [name] hLelon Frohlichbeen doing well in rehab.  She is stuck at 210 lbs.  She is breathing better and has more energy.   She is going to work on adding in more exercise and changing up her diet to help with weight loss.  She denies any heart failure symptoms currently.  Her blood pressures have been good and she checks them on occasion at home.   She will start to check them once a day.  Her sugars are still not great and she does check them twice a day.  She will talk with Melissa for some guideance. Overall, she is doing well on her medications. Sent EMMI edcuation on diabetes for her to review as well.     Expected Outcomes  Short: Check blood pressures more frequently. Complete EMMI education for diabetes.  Long: Continue to work on weight loss.        ITP Comments: ITP Comments    Row Name 10/27/18 1206 11/05/18 0553 11/07/18 0945 11/20/18 1028 11/25/18 1429   ITP Comments  Medical review completed today. INitial ITP sent to Dr M MilLoleta Chancereview,changes as needed and signature. Documentation of diagnosis can be found in Care Everywhere1/28/2020  30 day review. Continue with ITP unless directed changes by Medical Director  chart review. HAs attended orientation, wather and illness has delayed return.  [name] brought back her paperwork with her today.   Primary Cardiologist is now Dr. Saralyn Pilar  Our program is currently closed due to COVID-19.  We are communicating with patient via phone calls and emails.    [place] Name 12/03/18 1208           ITP Comments   30 day review. Continue with ITP unless directed changes by Medical Director chart review.          Comments:

## 2018-12-16 ENCOUNTER — Encounter: Payer: Self-pay | Admitting: *Deleted

## 2018-12-16 DIAGNOSIS — Z955 Presence of coronary angioplasty implant and graft: Secondary | ICD-10-CM

## 2018-12-29 ENCOUNTER — Encounter: Payer: Self-pay | Admitting: *Deleted

## 2018-12-29 DIAGNOSIS — Z955 Presence of coronary angioplasty implant and graft: Secondary | ICD-10-CM

## 2018-12-30 NOTE — Progress Notes (Unsigned)
Counselor reached out to patient per staff request.  Patient indicated that this time was inconvenient.  Counselor to try back tomorrow (4/22).

## 2018-12-31 NOTE — Progress Notes (Unsigned)
Counselor followed up with patient as discussed yesterday.  Patient reported that she was visiting with someone and would prefer a call at another time.  Counselor to try again within a week.

## 2019-01-07 NOTE — Progress Notes (Signed)
Counselor reached out to patient regarding ongoing follow up support.  Patient shared thoughts and feelings regarding inability to get outside and walk due to pollen and allergies.  Patient identifies as an extrovert and misses in-person contact.  She reported that she and family stay connected via telephone and internet.  Patient remains open to ongoing phone calls from counselor.  Counselor to check back in May, encouraged patient to call as needed.

## 2019-01-19 ENCOUNTER — Encounter: Payer: Self-pay | Admitting: *Deleted

## 2019-01-19 DIAGNOSIS — Z955 Presence of coronary angioplasty implant and graft: Secondary | ICD-10-CM

## 2019-01-28 ENCOUNTER — Other Ambulatory Visit: Payer: Self-pay

## 2019-01-28 NOTE — Progress Notes (Signed)
Attempted follow up with patient.  She deferred call as she is shopping.  Counselor to try back within next few days.

## 2019-01-29 ENCOUNTER — Inpatient Hospital Stay: Payer: Medicare HMO | Attending: Oncology | Admitting: Oncology

## 2019-01-29 ENCOUNTER — Encounter: Payer: Self-pay | Admitting: Oncology

## 2019-01-29 DIAGNOSIS — Z7982 Long term (current) use of aspirin: Secondary | ICD-10-CM

## 2019-01-29 DIAGNOSIS — C50211 Malignant neoplasm of upper-inner quadrant of right female breast: Secondary | ICD-10-CM

## 2019-01-29 DIAGNOSIS — Z170 Estrogen receptor positive status [ER+]: Secondary | ICD-10-CM

## 2019-01-29 DIAGNOSIS — Z87891 Personal history of nicotine dependence: Secondary | ICD-10-CM

## 2019-01-29 DIAGNOSIS — Z79899 Other long term (current) drug therapy: Secondary | ICD-10-CM

## 2019-01-29 NOTE — Progress Notes (Signed)
[facility]  [phone] [phone]  ID: Tammy Hatfield OB: 08/16/1946  MR#: [id number]  CSN#:[id number]  Patient Care Team: Medicine, [place] [facility] as PCP - General  I connected with Tammy Hatfield on 02/01/19 at 11:00 AM EDT by video enabled telemedicine visit and verified that I am speaking with the correct person using two identifiers.   I discussed the limitations, risks, security and privacy concerns of performing an evaluation and management service by telemedicine and the availability of in-person appointments. I also discussed with the patient that there may be a patient responsible charge related to this service. The patient expressed understanding and agreed to proceed.   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  INTERVAL HISTORY: Patient agreed to video enabled telemedicine visit for her routine yearly evaluation.  She continues to feel well and remains asymptomatic.  She admits to intermittent tenderness at the site of her surgical scar.  She denies any recent fevers or illnesses.  She has no neurologic complaints.  She has a good appetite and denies weight loss.  She denies any chest pain, shortness of breath, cough, or hemoptysis.  She denies any nausea, vomiting, constipation, or diarrhea.  She has no urinary complaints.  Patient feels at her baseline offers no specific complaints today.  REVIEW OF SYSTEMS:   Review of Systems  Constitutional: Negative.  Negative for fever, malaise/fatigue and weight loss.  Respiratory: Negative.  Negative for sputum production.   Cardiovascular: Negative.  Negative for chest pain and leg swelling.  Gastrointestinal: Negative.  Negative for abdominal pain.   Genitourinary: Negative.  Negative for dysuria.  Musculoskeletal: Negative.  Negative for back pain.  Skin: Negative.  Negative for rash.  Neurological: Negative.  Negative for sensory change, focal weakness and weakness.  Psychiatric/Behavioral: Negative.  The patient is not nervous/anxious.     As per HPI. Otherwise, a complete review of systems is negative.  PAST MEDICAL HISTORY: Past Medical History:  Diagnosis Date  . Anginal pain (HCC)   . Anxiety   . Arthritis    knees  . Barrett's esophagus   . Breast cancer (HCC) 2014   RT LUMPECTOMY  . Cancer (HCC) 2014   right breast - s\p Radiation and lumpectomy and LN dissection  . Chronic sinusitis   . Complication of anesthesia    hard to wake after Fentanyl  . COPD (chronic obstructive pulmonary disease) (HCC) 2012  . Coronary artery disease   . Degenerative lumbar disc   . Diabetes mellitus without complication (HCC)   . Dysphagia   . Esophageal reflux   . Fatty liver   . History of Helicobacter pylori infection   . Hypercholesteremia   . Hyperlipidemia   . Hypertension   . Personal history of radiation therapy   . Pneumonia   . Radiation 2014   RT BREAST CA  . Sleep apnea    CPAP  . Ulcer, stomach peptic    history of  . Vertigo    last episode 2017(approx)    PAST SURGICAL HISTORY: Past Surgical History:  Procedure Laterality Date  . ABDOMINAL HYSTERECTOMY    . BREAST BIOPSY Right 1985   NEG  . BREAST LUMPECTOMY Right 2014  . BUNIONECTOMY Bilateral 1980  . CATARACT EXTRACTION W/PHACO Left 04/01/2018     Procedure: CATARACT EXTRACTION PHACO AND INTRAOCULAR LENS PLACEMENT ([place]) LEFT DIABETES IVA TOPICAL;  Surgeon: [name], MD;  Location: [place];  Service: Ophthalmology;  Laterality: Left;  Diabetic - insulin and oral meds sleep apnea  . CATARACT EXTRACTION W/PHACO Right 04/30/2018   Procedure: CATARACT EXTRACTION PHACO AND INTRAOCULAR LENS PLACEMENT ([place]) RIGHT DIABETIC;  Surgeon:  [name], MD;  Location: [place];  Service: Ophthalmology;  Laterality: Right;  Diabetic - insulin sleep apnea  . CHOLECYSTECTOMY    . CORONARY ANGIOPLASTY    . CORONARY STENT INTERVENTION N/A 07/05/2017   Procedure: CORONARY STENT INTERVENTION;  Surgeon: [name], MD;  Location: [place] [facility];  Service: Cardiovascular;  Laterality: N/A;  . LAPAROSCOPIC TUBAL LIGATION Bilateral 1967  . LEFT HEART CATH AND CORONARY ANGIOGRAPHY N/A 07/05/2017   Procedure: LEFT HEART CATH AND CORONARY ANGIOGRAPHY;  Surgeon: [name], MD;  Location: [place] [facility];  Service: Cardiovascular;  Laterality: N/A;  . LEFT HEART CATH AND CORONARY ANGIOGRAPHY Left 09/18/2018   Procedure: Left Heart Cath w/ Coronary Angiography;  Surgeon: [name], MD;  Location: [place] [facility];  Service: Cardiovascular;  Laterality: Left;  . SHOULDER ARTHROSCOPY WITH ROTATOR CUFF REPAIR Right 02/09/2015   Procedure: SHOULDER ARTHROSCOPY WITH ROTATOR CUFF REPAIR,release long head biceps tendon,subacromial decompression.;  Surgeon: Leanor Kail, MD;  Location: [facility];  Service: Orthopedics;  Laterality: Right;  . UMBILICAL HERNIA REPAIR N/A   . UVULOPALATOPHARYNGOPLASTY N/A 2001    FAMILY HISTORY Family History  Problem Relation Age of Onset  . Breast cancer Neg Hx        ADVANCED DIRECTIVES:    HEALTH MAINTENANCE: Social History   Tobacco Use  . Smoking status: Former Smoker    Packs/day: 2.00    Years: 30.00    Pack years: 60.00    Last attempt to quit: 02/01/2000    Years since quitting: 19.0  . Smokeless tobacco: Never Used  . Tobacco comment: 07/18/2017 former smoker quit 1999  Substance Use Topics  . Alcohol use: No    Alcohol/week: 0.0 standard drinks    Comment: may have drink on Holidays  . Drug use: No     Colonoscopy:  PAP:  Bone density:  Lipid panel:  Allergies  Allergen Reactions  . Amoxicillin Other (See Comments)    Yeast  infection DID THE REACTION INVOLVE: Swelling of the face/tongue/throat, SOB, or low BP? No Sudden or severe rash/hives, skin peeling, or the inside of the mouth or nose? No Did it require medical treatment? No When did it last happen?within the past 10 years If all above answers are "NO", may proceed with cephalosporin use.  . Avelox [Moxifloxacin] Other (See Comments)    Muscle pain  . Benazepril Cough  . Bupropion     Dizziness, Headache  . Byetta 10 Mcg Pen [Exenatide] Diarrhea  . Isosorbide Swelling    Headaches, issues with balance  . Metformin Diarrhea  . Neurontin [Gabapentin] Other (See Comments)    Mouth blisters, joint pain, depression   . Sulfa Antibiotics Other (See Comments)    Headache  . Ceclor [Cefaclor] Rash    Current Outpatient Medications  Medication Sig Dispense Refill  . aspirin EC 81 MG tablet Take 81 mg by mouth daily.    . Carboxymethylcellul-Glycerin (LUBRICATING EYE DROPS OP) Place 1 drop into both eyes 2 (two) times daily.    . clopidogrel (PLAVIX) 75 MG tablet Take 75 mg by mouth every evening.     Marland Kitchen  glipiZIDE (GLUCOTROL) 10 MG tablet Take 10 mg by mouth 2 (two) times daily.     Marland Kitchen ibuprofen (ADVIL,MOTRIN) 200 MG tablet Take 400 mg by mouth daily as needed for headache or moderate pain.    Marland Kitchen insulin NPH-regular Human (NOVOLIN 70/30) (70-30) 100 UNIT/ML injection Inject 25 Units into the skin 2 (two) times daily with a meal.     . losartan (COZAAR) 50 MG tablet Take 50 mg by mouth every morning.     . metoprolol succinate (TOPROL-XL) 25 MG 24 hr tablet Take 25 mg by mouth daily.     . nitroGLYCERIN (NITROSTAT) 0.4 MG SL tablet Place 1 tablet (0.4 mg total) under the tongue every 5 (five) minutes as needed for chest pain. 30 tablet 0  . pantoprazole (PROTONIX) 20 MG tablet Take 20 mg by mouth daily.    Marland Kitchen PARoxetine (PAXIL) 40 MG tablet Take 40 mg by mouth 2 (two) times daily.     . rosuvastatin (CRESTOR) 20 MG tablet Take 20 mg by mouth every  evening.    . traMADol (ULTRAM) 50 MG tablet Take 50-100 mg by mouth 2 (two) times daily as needed for moderate pain.     . valACYclovir (VALTREX) 500 MG tablet Take 500 mg by mouth 2 (two) times daily as needed (fever blisters).      No current facility-administered medications for this visit.     OBJECTIVE: There were no vitals filed for this visit.   There is no height or weight on file to calculate BMI.    ECOG FS:0 - Asymptomatic  General: Well-developed, well-nourished, no acute distress. HEENT: Normocephalic. Neuro: Alert, answering all questions appropriately. Cranial nerves grossly intact. Skin: No rashes or petechiae noted. Psych: Normal affect.  LAB RESULTS:  Lab Results  Component Value Date   NA 140 07/06/2017   K 4.2 07/06/2017   CL 105 07/06/2017   CO2 28 07/06/2017   GLUCOSE 151 (H) 07/06/2017   BUN 19 07/06/2017   CREATININE 1.00 07/06/2017   CALCIUM 8.8 (L) 07/06/2017   PROT 6.7 02/15/2015   ALBUMIN 3.4 (L) 02/15/2015   AST 47 (H) 02/15/2015   ALT 310 (H) 02/15/2015   ALKPHOS 109 02/15/2015   BILITOT 0.7 02/15/2015   GFRNONAA 55 (L) 07/06/2017   GFRAA >60 07/06/2017    Lab Results  Component Value Date   WBC 6.8 01/30/2018   NEUTROABS 4.1 01/30/2018   HGB 12.8 01/30/2018   HCT 37.4 01/30/2018   MCV 90.8 01/30/2018   PLT 191 01/30/2018     STUDIES: No results found.  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  PLAN:    1. 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  She completed XRT in approximately January 2015. Patient could not tolerate letrozole, anastrozole, or Aromasin. She elected to discontinue all aromatase inhibitors despite knowing her risk of  recurrence increases.  Her most recent mammogram on July 09, 2018 was reported as BI-RADS 2.  Repeat in October 2020.  Return to clinic in 1 year for further evaluation at which point could possibly consider discharging patient from clinic.   I provided 15 minutes of face-to-face video visit time during this encounter, and > 50% was spent counseling as documented under my assessment &  plan.   Patient expressed understanding and was in agreement with this plan. She also understands that She can call clinic at any time with any questions, concerns, or complaints.    Tammy Huger, MD   02/01/2019 8:51 AM

## 2019-01-29 NOTE — Progress Notes (Signed)
Patient reports continued pain at site of scar tissue on breast bone, denies other concerns today.

## 2019-01-30 NOTE — Progress Notes (Signed)
Counselor contacted patient regarding ongoing follow up.  Patient observed historic importance of COVID-19 and wondering what next generation will learn from it.  Patient shared her thoughts and feelings regarding her experience it.  Patient continues to remain connected to family and friends via computer and telephone but misses in-person engagement.  Open to telephone contact next month.

## 2019-02-09 ENCOUNTER — Telehealth: Payer: Self-pay | Admitting: *Deleted

## 2019-02-09 DIAGNOSIS — C50211 Malignant neoplasm of upper-inner quadrant of right female breast: Secondary | ICD-10-CM

## 2019-02-09 NOTE — Telephone Encounter (Signed)
Orders updated

## 2019-02-20 ENCOUNTER — Other Ambulatory Visit
Admission: RE | Admit: 2019-02-20 | Discharge: 2019-02-20 | Disposition: A | Discharge status: Home / Self Care | Payer: Medicare HMO | Source: Ambulatory Visit | Attending: Cardiology | Admitting: Cardiology

## 2019-02-20 ENCOUNTER — Other Ambulatory Visit: Payer: Self-pay

## 2019-02-20 DIAGNOSIS — Z1159 Encounter for screening for other viral diseases: Secondary | ICD-10-CM | POA: Insufficient documentation

## 2019-02-20 DIAGNOSIS — Z01812 Encounter for preprocedural laboratory examination: Secondary | ICD-10-CM | POA: Insufficient documentation

## 2019-02-20 DIAGNOSIS — I10 Essential (primary) hypertension: Secondary | ICD-10-CM | POA: Diagnosis not present

## 2019-02-21 LAB — NOVEL CORONAVIRUS, NAA (HOSP ORDER, SEND-OUT TO REF LAB; TAT 18-24 HRS): SARS-CoV-2, NAA: NOT DETECTED

## 2019-02-23 DIAGNOSIS — Z955 Presence of coronary angioplasty implant and graft: Secondary | ICD-10-CM

## 2019-02-25 ENCOUNTER — Ambulatory Visit
Admission: AD | Admit: 2019-02-25 | Discharge: 2019-02-25 | Disposition: A | Discharge status: Home / Self Care | Payer: Medicare HMO | Attending: Cardiology | Admitting: Cardiology

## 2019-02-25 ENCOUNTER — Other Ambulatory Visit: Payer: Self-pay

## 2019-02-25 ENCOUNTER — Encounter
Admission: AD | Disposition: A | Discharge status: Home / Self Care | Payer: Self-pay | Source: Home / Self Care | Attending: Cardiology

## 2019-02-25 DIAGNOSIS — F329 Major depressive disorder, single episode, unspecified: Secondary | ICD-10-CM | POA: Diagnosis not present

## 2019-02-25 DIAGNOSIS — G4730 Sleep apnea, unspecified: Secondary | ICD-10-CM | POA: Insufficient documentation

## 2019-02-25 DIAGNOSIS — I5032 Chronic diastolic (congestive) heart failure: Secondary | ICD-10-CM | POA: Insufficient documentation

## 2019-02-25 DIAGNOSIS — I110 Hypertensive heart disease with heart failure: Secondary | ICD-10-CM | POA: Diagnosis not present

## 2019-02-25 DIAGNOSIS — Z79899 Other long term (current) drug therapy: Secondary | ICD-10-CM | POA: Diagnosis not present

## 2019-02-25 DIAGNOSIS — E785 Hyperlipidemia, unspecified: Secondary | ICD-10-CM | POA: Diagnosis not present

## 2019-02-25 DIAGNOSIS — E7800 Pure hypercholesterolemia, unspecified: Secondary | ICD-10-CM | POA: Diagnosis not present

## 2019-02-25 DIAGNOSIS — Z794 Long term (current) use of insulin: Secondary | ICD-10-CM | POA: Diagnosis not present

## 2019-02-25 DIAGNOSIS — Z1159 Encounter for screening for other viral diseases: Secondary | ICD-10-CM | POA: Insufficient documentation

## 2019-02-25 DIAGNOSIS — Z853 Personal history of malignant neoplasm of breast: Secondary | ICD-10-CM | POA: Diagnosis not present

## 2019-02-25 DIAGNOSIS — E1140 Type 2 diabetes mellitus with diabetic neuropathy, unspecified: Secondary | ICD-10-CM | POA: Diagnosis not present

## 2019-02-25 DIAGNOSIS — J449 Chronic obstructive pulmonary disease, unspecified: Secondary | ICD-10-CM | POA: Insufficient documentation

## 2019-02-25 DIAGNOSIS — Z7982 Long term (current) use of aspirin: Secondary | ICD-10-CM | POA: Insufficient documentation

## 2019-02-25 DIAGNOSIS — R079 Chest pain, unspecified: Secondary | ICD-10-CM | POA: Diagnosis not present

## 2019-02-25 DIAGNOSIS — Z87891 Personal history of nicotine dependence: Secondary | ICD-10-CM | POA: Diagnosis not present

## 2019-02-25 DIAGNOSIS — I2510 Atherosclerotic heart disease of native coronary artery without angina pectoris: Secondary | ICD-10-CM | POA: Insufficient documentation

## 2019-02-25 HISTORY — PX: LEFT HEART CATH AND CORONARY ANGIOGRAPHY: CATH118249

## 2019-02-25 LAB — GLUCOSE, CAPILLARY
Glucose-Capillary: 181 mg/dL — ABNORMAL HIGH (ref 70–99)
Glucose-Capillary: 148 mg/dL — ABNORMAL HIGH (ref 70–99)

## 2019-02-25 SURGERY — LEFT HEART CATH AND CORONARY ANGIOGRAPHY
Anesthesia: Moderate Sedation | Laterality: Left

## 2019-02-25 MED ORDER — SODIUM CHLORIDE 0.9 % IV SOLN: 250.0000 mL | INTRAVENOUS | Status: DC | PRN

## 2019-02-25 MED ORDER — LABETALOL HCL 5 MG/ML IV SOLN: 10.0000 mg | INTRAVENOUS | Status: DC | PRN

## 2019-02-25 MED ORDER — HEPARIN (PORCINE) IN NACL 1000-0.9 UT/500ML-% IV SOLN
INTRAVENOUS | Status: AC
  Filled 2019-02-25: qty 1000

## 2019-02-25 MED ORDER — FENTANYL CITRATE (PF) 100 MCG/2ML IJ SOLN
INTRAMUSCULAR | Status: AC
  Filled 2019-02-25: qty 2

## 2019-02-25 MED ORDER — ASPIRIN 81 MG PO CHEW
81.0000 mg | CHEWABLE_TABLET | ORAL | Status: AC
  Administered 2019-02-25: 07:00:00 81 mg via ORAL

## 2019-02-25 MED ORDER — VERAPAMIL HCL 2.5 MG/ML IV SOLN
INTRAVENOUS | Status: AC
  Filled 2019-02-25: qty 2

## 2019-02-25 MED ORDER — SODIUM CHLORIDE 0.9 % WEIGHT BASED INFUSION: 1.0000 mL/kg/h | INTRAVENOUS | Status: DC

## 2019-02-25 MED ORDER — SODIUM CHLORIDE 0.9 % WEIGHT BASED INFUSION
1.0000 mL/kg/h | INTRAVENOUS | Status: DC
  Administered 2019-02-25: 1 mL/kg/h via INTRAVENOUS

## 2019-02-25 MED ORDER — SODIUM CHLORIDE 0.9% FLUSH: 3.0000 mL | INTRAVENOUS | Status: DC | PRN

## 2019-02-25 MED ORDER — HYDRALAZINE HCL 20 MG/ML IJ SOLN: 10.0000 mg | INTRAMUSCULAR | Status: DC | PRN

## 2019-02-25 MED ORDER — FENTANYL CITRATE (PF) 100 MCG/2ML IJ SOLN
INTRAMUSCULAR | Status: DC | PRN
  Administered 2019-02-25: 50 ug via INTRAVENOUS
  Administered 2019-02-25: 25 ug via INTRAVENOUS

## 2019-02-25 MED ORDER — HEPARIN SODIUM (PORCINE) 1000 UNIT/ML IJ SOLN
INTRAMUSCULAR | Status: AC
  Filled 2019-02-25: qty 1

## 2019-02-25 MED ORDER — SODIUM CHLORIDE 0.9% FLUSH: 3.0000 mL | Freq: Two times a day (BID) | INTRAVENOUS | Status: DC

## 2019-02-25 MED ORDER — IOHEXOL 300 MG/ML  SOLN
INTRAMUSCULAR | Status: DC | PRN
  Administered 2019-02-25: 100 mL via INTRA_ARTERIAL

## 2019-02-25 MED ORDER — HEPARIN SODIUM (PORCINE) 1000 UNIT/ML IJ SOLN
INTRAMUSCULAR | Status: DC | PRN
  Administered 2019-02-25: 5000 [IU] via INTRAVENOUS

## 2019-02-25 MED ORDER — HEPARIN (PORCINE) IN NACL 1000-0.9 UT/500ML-% IV SOLN
INTRAVENOUS | Status: DC | PRN
  Administered 2019-02-25: 1000 mL

## 2019-02-25 MED ORDER — MIDAZOLAM HCL 2 MG/2ML IJ SOLN
INTRAMUSCULAR | Status: DC | PRN
  Administered 2019-02-25: 1 mg via INTRAVENOUS

## 2019-02-25 MED ORDER — ACETAMINOPHEN 325 MG PO TABS: 650.0000 mg | ORAL_TABLET | ORAL | Status: DC | PRN

## 2019-02-25 MED ORDER — MIDAZOLAM HCL 2 MG/2ML IJ SOLN
INTRAMUSCULAR | Status: AC
  Filled 2019-02-25: qty 2

## 2019-02-25 MED ORDER — SODIUM CHLORIDE 0.9 % WEIGHT BASED INFUSION: 3.0000 mL/kg/h | INTRAVENOUS | Status: AC

## 2019-02-25 MED ORDER — ONDANSETRON HCL 4 MG/2ML IJ SOLN: 4.0000 mg | Freq: Four times a day (QID) | INTRAMUSCULAR | Status: DC | PRN

## 2019-02-25 SURGICAL SUPPLY — 8 items
CATH INFINITI 5 FR JL3.5 (CATHETERS) ×3 IMPLANT
CATH INFINITI 5FR ANG PIGTAIL (CATHETERS) ×3 IMPLANT
CATH INFINITI JR4 5F (CATHETERS) ×3 IMPLANT
DEVICE RAD TR BAND REGULAR (VASCULAR PRODUCTS) ×3 IMPLANT
GLIDESHEATH SLEND SS 6F .021 (SHEATH) ×3 IMPLANT
KIT MANI 3VAL PERCEP (MISCELLANEOUS) ×3 IMPLANT
PACK CARDIAC CATH (CUSTOM PROCEDURE TRAY) ×3 IMPLANT
WIRE ROSEN-J .035X260CM (WIRE) ×3 IMPLANT

## 2019-03-25 ENCOUNTER — Encounter: Payer: Self-pay | Admitting: *Deleted

## 2019-03-25 DIAGNOSIS — Z955 Presence of coronary angioplasty implant and graft: Secondary | ICD-10-CM

## 2019-03-25 NOTE — Progress Notes (Signed)
Cardiac Individual Treatment Plan  Patient Details  Name: Tammy Hatfield MRN: [id number] Date of Birth: 02/10/1946 Referring Provider:     Cardiac Rehab from 10/27/2018 in [facility]iac [facility] Re[facility]  Referring Provider  [name] MD      Initial Encounter Date:    Cardiac Rehab from 10/27/2018 in [facility] Cardiac and Pulmonary Rehab  Date  10/27/18      Visit Diagnosis: Status post coronary artery stent placement  Patient's Home Medications on Admission:  Current Outpatient Medications:    ALPRAZolam (XANAX) 0.5 MG tablet, Take 0.5 mg by mouth 2 (two) times daily., Disp: , Rfl:    aspirin EC 81 MG tablet, Take 81 mg by mouth daily., Disp: , Rfl:    Carboxymethylcellul-Glycerin (LUBRICATING EYE DROPS OP), Place 1 drop into both eyes 2 (two) times daily., Disp: , Rfl:    Cholecalciferol (VITAMIN D3) 25 MCG (1000 UT) CAPS, Take 1,000 Units by mouth daily., Disp: , Rfl:    clopidogrel (PLAVIX) 75 MG tablet, Take 75 mg by mouth every evening. , Disp: , Rfl:    fexofenadine (ALLEGRA) 180 MG tablet, Take 180 mg by mouth daily., Disp: , Rfl:    furosemide (LASIX) 20 MG tablet, Take 20 mg by mouth daily., Disp: , Rfl:    glipiZIDE (GLUCOTROL) 10 MG tablet, Take 10 mg by mouth 2 (two) times daily. , Disp: , Rfl:    ibuprofen (ADVIL,MOTRIN) 200 MG tablet, Take 400 mg by mouth daily as needed for headache or moderate pain., Disp: , Rfl:    insulin NPH-regular Human (NOVOLIN 70/30) (70-30) 100 UNIT/ML injection, Inject 25 Units into the skin 2 (two) times daily with a meal. , Disp: , Rfl:    losartan (COZAAR) 50 MG tablet, Take 50 mg by mouth every morning. , Disp: , Rfl:    metoprolol succinate (TOPROL-XL) 25 MG 24 hr tablet, Take 25 mg by mouth daily. , Disp: , Rfl:    nitroGLYCERIN (NITROSTAT) 0.4 MG SL tablet, Place 1 tablet (0.4 mg total) under the tongue every 5 (five) minutes as needed for chest pain., Disp: 30 tablet, Rfl: 0   pantoprazole (PROTONIX) 20 MG  tablet, Take 20 mg by mouth daily., Disp: , Rfl:    PARoxetine (PAXIL) 40 MG tablet, Take 40 mg by mouth 2 (two) times daily. , Disp: , Rfl:    rosuvastatin (CRESTOR) 40 MG tablet, Take 40 mg by mouth every evening. , Disp: , Rfl:    traMADol (ULTRAM) 50 MG tablet, Take 50-100 mg by mouth 2 (two) times daily as needed for moderate pain. , Disp: , Rfl:    valACYclovir (VALTREX) 500 MG tablet, Take 500 mg by mouth 2 (two) times daily as needed (fever blisters). , Disp: , Rfl:   Past Medical History: Past Medical History:  Diagnosis Date   Anginal pain ([place])    Anxiety    Arthritis    knees   Barrett's esophagus    Breast cancer ([place]) 2014   RT LUMPECTOMY   Cancer ([place]) 2014   right breast - s\p Radiation and lumpectomy and LN dissection   Chronic sinusitis    Complication of anesthesia    hard to wake after Fentanyl   COPD (chronic obstructive pulmonary disease) ([place]) 2012   Coronary artery disease    Degenerative lumbar disc    Diabetes mellitus without complication (HCC)    Dysphagia    Esophageal reflux    Fatty liver    History of Helicobacter  pylori infection    Hypercholesteremia    Hyperlipidemia    Hypertension    Personal history of radiation therapy    Pneumonia    Radiation 2014   RT BREAST CA   Sleep apnea    CPAP   Ulcer, stomach peptic    history of   Vertigo    last episode 2017(approx)    Tobacco Use: Social History   Tobacco Use  Smoking Status Former Smoker   Packs/day: 2.00   Years: 30.00   Pack years: 60.00   Quit date: 02/01/2000   Years since quitting: 19.1  Smokeless Tobacco Never Used  Tobacco Comment   07/18/2017 former smoker quit 1999    Labs: Recent Review Scientist, physiological    Labs for ITP Cardiac and Pulmonary Rehab Latest Ref Rng & Units 02/11/2015 07/05/2017   Cholestrol 0 - 200 mg/dL - 255(H)   LDLCALC 0 - 99 mg/dL - 147(H)   HDL >40 mg/dL - 62   Trlycerides <150 mg/dL - 231(H)   Hemoglobin A1c  4.0 - 6.0 % 7.4(H) -       Exercise Target Goals: Exercise Program Goal: Individual exercise prescription set using results from initial 6 min walk test and THRR while considering  patients activity barriers and safety.   Exercise Prescription Goal: Initial exercise prescription builds to 30-45 minutes a day of aerobic activity, 2-3 days per week.  Home exercise guidelines will be given to patient during program as part of exercise prescription that the participant will acknowledge.  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 HR  116 bpm     Max Ex. BP  138/64     2 Minute Post BP  126/56        Oxygen Initial Assessment:   Oxygen Re-Evaluation:   Oxygen Discharge (Final Oxygen Re-Evaluation):   Initial Exercise Prescription: Initial Exercise Prescription - 10/27/18 1500      Date of Initial Exercise RX and Referring Provider   Date  10/27/18    Referring Provider  [name] MD      Treadmill   MPH  1.5    Grade  0.5    Minutes  15    METs  2.25      NuStep   Level  2    SPM  80  Minutes  15    METs  2.2      Arm Ergometer   Level  2    Watts  34    RPM  30    Minutes  15    METs  2.2       Prescription Details   Frequency (times per week)  3    Duration  Progress to 45 minutes of aerobic exercise without signs/symptoms of physical distress      Intensity   THRR 40-80% of Max Heartrate  98-131    Ratings of Perceived Exertion  11-13    Perceived Dyspnea  0-4      Progression   Progression  Continue to progress workloads to maintain intensity without signs/symptoms of physical distress.      Resistance Training   Training Prescription  Yes    Weight  3 lbs    Reps  10-15       Perform Capillary Blood Glucose checks as needed.  Exercise Prescription Changes: Exercise Prescription Changes    Row Name 10/27/18 1200 11/11/18 1600 11/21/18 0800 11/25/18 1400       Response to Exercise   Blood Pressure (Admit)  130/74  122/62  --  124/70    Blood Pressure (Exercise)  138/64  134/72  --  120/82    Blood Pressure (Exit)  126/56  90/48  --  104/74    Heart Rate (Admit)  65 bpm  79 bpm  --  99 bpm    Heart Rate (Exercise)  116 bpm  95 bpm  --  103 bpm    Heart Rate (Exit)  71 bpm  75 bpm  --  90 bpm    Oxygen Saturation (Admit)  97 %  --  --  --    Oxygen Saturation (Exercise)  94 %  --  --  --    Rating of Perceived Exertion (Exercise)  12  13  --  13    Symptoms  hip pain 5/10, knee pain 4/10  none  --  none    Comments  walk test results  --  --  --    Duration  --  Progress to 45 minutes of aerobic exercise without signs/symptoms of physical distress  --  Continue with 45 min of aerobic exercise without signs/symptoms of physical distress.    Intensity  --  THRR unchanged  --  THRR unchanged      Progression   Progression  --  Continue to progress workloads to maintain intensity without signs/symptoms of physical distress.  --  Continue to progress workloads to maintain intensity without signs/symptoms of physical distress.    Average METs  --  2.12  --  2.77      Resistance Training   Training Prescription  --  Yes  --  Yes    Weight  --  3 lbs  --  3 lbs     Reps  --  10-15  --  10-15      Interval Training   Interval Training  --  No  --  No      Treadmill   MPH  --  1.5  --  2    Grade  --  0.5  --  1    Minutes  --  15  --  15    METs  --  2.25  --  2.81      NuStep   Level  --  2  --  2    Minutes  --  15  --  15    METs  --  --  --  3.4      Arm Ergometer   Level  --  2  --  2    Minutes  --  15  --  15    METs  --  2  --  2.1      Home Exercise Plan   Plans to continue exercise at  --  --  Home (comment) walking, treadmill, recumbent bike  Home (comment) walking, treadmill, recumbent bike    Frequency  --  --  Add 2 additional days to program exercise sessions.  Add 2 additional days to program exercise sessions.    Initial Home Exercises Provided  --  --  11/21/18  11/21/18       Exercise Comments: Exercise Comments    Row Name 11/07/18 [id number]           Exercise Comments   First full day of exercise!  Patient was oriented to gym and equipment including functions, settings, policies, and procedures.  Patient's individual exercise prescription and treatment plan were reviewed.  All starting workloads were established based on the results of the 6 minute walk test done at initial orientation visit.  The plan for exercise progression was also introduced and progression will be customized based on patient's performance and goals.          Exercise Goals and Review: Exercise Goals    Row Name 10/27/18 1522             Exercise Goals   Increase Physical Activity  Yes       Intervention  Provide advice, education, support and counseling about physical activity/exercise needs.;Develop an individualized exercise prescription for aerobic and resistive training based on initial evaluation findings, risk stratification, comorbidities and participant's personal goals.       Expected Outcomes  Short Term: Attend rehab on a regular basis to increase amount of physical activity.;Long Term: Add in home exercise to make exercise part  of routine and to increase amount of physical activity.;Long Term: Exercising regularly at least 3-5 days a week.       Increase Strength and Stamina  Yes       Intervention  Provide advice, education, support and counseling about physical activity/exercise needs.;Develop an individualized exercise prescription for aerobic and resistive training based on initial evaluation findings, risk stratification, comorbidities and participant's personal goals.       Expected Outcomes  Short Term: Increase workloads from initial exercise prescription for resistance, speed, and METs.;Short Term: Perform resistance training exercises routinely during rehab and add in resistance training at home;Long Term: Improve cardiorespiratory fitness, muscular endurance and strength as measured by increased METs and functional capacity (6MWT)       Able to understand and use rate of perceived exertion (RPE) scale  Yes       Intervention  Provide education and explanation on how to use RPE scale       Expected Outcomes  Short Term: Able to use RPE daily in rehab to express subjective intensity level;Long Term:  Able to use RPE to guide intensity level when exercising independently       Able to understand and use Dyspnea scale  Yes       Intervention  Provide education and explanation on how to use Dyspnea scale       Expected Outcomes  Short Term: Able to use  Dyspnea scale daily in rehab to express subjective sense of shortness of breath during exertion;Long Term: Able to use Dyspnea scale to guide intensity level when exercising independently       Knowledge and understanding of Target Heart Rate Range (THRR)  Yes       Intervention  Provide education and explanation of THRR including how the numbers were predicted and where they are located for reference       Expected Outcomes  Short Term: Able to state/look up THRR;Long Term: Able to use THRR to govern intensity when exercising independently;Short Term: Able to use daily as  guideline for intensity in rehab       Able to check pulse independently  Yes       Intervention  Provide education and demonstration on how to check pulse in carotid and radial arteries.;Review the importance of being able to check your own pulse for safety during independent exercise       Expected Outcomes  Short Term: Able to explain why pulse checking is important during independent exercise;Long Term: Able to check pulse independently and accurately       Understanding of Exercise Prescription  Yes       Intervention  Provide education, explanation, and written materials on patient's individual exercise prescription       Expected Outcomes  Short Term: Able to explain program exercise prescription;Long Term: Able to explain home exercise prescription to exercise independently          Exercise Goals Re-Evaluation : Exercise Goals Re-Evaluation    Row Name 11/07/18 0946 11/11/18 1615 11/21/18 0821 11/25/18 1429 12/16/18 1359     Exercise Goal Re-Evaluation   Exercise Goals Review  Increase Physical Activity;Increase Strength and Stamina;Able to understand and use rate of perceived exertion (RPE) scale;Knowledge and understanding of Target Heart Rate Range (THRR);Understanding of Exercise Prescription  Increase Physical Activity;Increase Strength and Stamina;Understanding of Exercise Prescription  Increase Physical Activity;Increase Strength and Stamina;Understanding of Exercise Prescription  Increase Physical Activity;Increase Strength and Stamina;Understanding of Exercise Prescription  Increase Physical Activity;Increase Strength and Stamina;Understanding of Exercise Prescription   Comments  Reviewed RPE scale, THR and program prescription with pt today.  Pt voiced understanding and was given a copy of goals to take home.   [name] is off to another good start in rehab.  She even came to class today since she will be missing tomorrow!  She is already on level 2 for the arm crank!  We will continue  to monitor her progression.   [name] is doing well in rehab.  She is starting to feeling stronger and has more stamina.  Reviewed home exercise with pt today.  Pt plans to walk and use treadmill and recumbent bike at home for exercise.  Reviewed THR, pulse, RPE, sign and symptoms, NTG use, and when to call 911 or MD.  Also discussed weather considerations and indoor options.  Pt voiced understanding.  [name] continues to do well in rehab.  She is planning to go to her gym in [place] with her exercise packet.  We will continue to monitor her progress at home.   [name] is doing well at home.  She is getting out to exercise daily.  Today, she has already gotten over 3,000 steps.  She walks around her neighborhood every day.  She is also staying active around the house as she clean each room.    Expected Outcomes  Short: Use RPE daily to regulate intensity. Long: Follow program prescription in  THR.  Short: Attend class regularly.  Long: Continue to increase strength and stamina.   Short: Start to add home exercise back into routine.  Long: Continue to exercise more independently.   Short: Continue to exercise.  Long: Continue to increase strength and stamina.   Short: Continue wto walk daily.  Long: Continue to stay active.    [place] Name 12/29/18 1025 01/19/19 1242           Exercise Goal Re-Evaluation   Exercise Goals Review  Increase Physical Activity;Increase Strength and Stamina;Understanding of Exercise Prescription  Increase Physical Activity;Increase Strength and Stamina;Understanding of Exercise Prescription      Comments  [name] is not doing as well at home. She has lost her motivation to get out to exercise.  I encouraged her to watch the videos again to help get her motivated again.  I have also shared this with the mental health counselor to reach out to her.  [name] said she would try the videos again.[name] is doing better at home.  She is looking forward to the nice weather this week to be able to get out to go  walk again.        Expected Outcomes  Short: Try videos again and exercise more for boost.  Long: Continue to rebuild strength and stamina.   Short: Continue to try to get out to go walk more.  Long: Continue to add activity back in.          Discharge Exercise Prescription (Final Exercise Prescription Changes): Exercise Prescription Changes - 11/25/18 1400      Response to Exercise   Blood Pressure (Admit)  124/70    Blood Pressure (Exercise)  120/82    Blood Pressure (Exit)  104/74    Heart Rate (Admit)  99 bpm    Heart Rate (Exercise)  103 bpm    Heart Rate (Exit)  90 bpm    Rating of Perceived Exertion (Exercise)  13    Symptoms  none    Duration  Continue with 45 min of aerobic exercise without signs/symptoms of physical distress.    Intensity  THRR unchanged      Progression   Progression  Continue to progress workloads to maintain intensity without signs/symptoms of physical distress.    Average METs  2.77      Resistance Training   Training Prescription  Yes    Weight  3 lbs    Reps  10-15      Interval Training   Interval Training  No      Treadmill   MPH  2    Grade  1    Minutes  15    METs  2.81      NuStep   Level  2    Minutes  15    METs  3.4      Arm Ergometer   Level  2    Minutes  15    METs  2.1      Home Exercise Plan   Plans to continue exercise at  Home (comment)   walking, treadmill, recumbent bike   Frequency  Add 2 additional days to program exercise sessions.    Initial Home Exercises Provided  11/21/18       Nutrition:  Target Goals: Understanding of nutrition guidelines, daily intake of sodium '1500mg'$ , cholesterol '200mg'$ , calories 30% from fat and 7% or less from saturated fats, daily to have 5 or more servings of fruits and vegetables.  Biometrics: Pre Biometrics - 10/27/18 1523      Pre Biometrics   Height  5' 4.9" (1.648 m)    Weight  208 lb 11.2 oz (94.7 kg)    Waist Circumference  40 inches    Hip Circumference  46  inches    Waist to Hip Ratio  0.87 %    BMI (Calculated)  34.86    Single Leg Stand  5.64 seconds        Nutrition Therapy Plan and Nutrition Goals: Nutrition Therapy & Goals - 12/03/18 1409      Nutrition Therapy   Diet  Diabetic Heart Healthy Low Sodium 1500-1700kcal    Protein (specify units)  75-85g    Fiber  25 grams    Whole Grain Foods  3 servings    Saturated Fats  12 max. grams    Fruits and Vegetables  5 servings/day    Sodium  1.5 grams      Personal Nutrition Goals   Nutrition Goal  ST: choosing healthy snacks (protein, CHO, fat) LT: Work on [employer]    Comments  Discussed modified diabetic MyPlate, discussed soluble fiber and the importance of CHO and plants. pt typically tires to eat healthy, eats oatmeal in the morning with skim milk (changing to almond milk) and eggs. discussed diet recommendations and how to lower triglycerides and cholesterol. Discussed Na recommendations and what to look for as well as how to break that up in a day.       Intervention Plan   Intervention  Nutrition handout(s) given to patient.    Expected Outcomes  Short Term Goal: Understand basic principles of dietary content, such as calories, fat, sodium, cholesterol and nutrients.;Short Term Goal: A plan has been developed with personal nutrition goals set during dietitian appointment.;Long Term Goal: Adherence to prescribed nutrition plan.       Nutrition Assessments: Nutrition Assessments - 11/07/18 0947      MEDFICTS Scores   Pre Score  86       Nutrition Goals Re-Evaluation: Nutrition Goals Re-Evaluation    Row Name 11/21/18 0825 02/23/19 1228           Goals   Nutrition Goal  Change diet to heart healthy  ST: drink a glass of water with lunch and dinner. LT: work on [place] has been following the keto diet.  We talked about how this is not the best for heart patients.  She is going start to transistion.  She will try leaner meats and more fruits and  vegetables. Sent EMMI education for diet and diabetes to review.   Will eat berries (sometimes puts splenda on her strawberries) and unsalted nuts as a snack. Will get drink enhancers becuase she doesn't like the tast of water, but will drink unsweetened ice tea with splenda. Pt reports that she doesnt usually drink any water in a day, but offered that she could drink plain water with meals (lunch and dinners) and would like to make that her next goal.      Expected Outcome  Short: Meet with Melissa about diet and changes to make.  Reviewe EMMI education. Long: Transition to heart healthy diet.   will drink 2 glasses of water per day in addition to her other beverages.         Nutrition Goals Discharge (Final Nutrition Goals Re-Evaluation): Nutrition Goals Re-Evaluation - 02/23/19 1228      Goals   Nutrition  Goal  ST: drink a glass of water with lunch and dinner. LT: work on [place]  Will eat berries (sometimes puts splenda on her strawberries) and unsalted nuts as a snack. Will get drink enhancers becuase she doesn't like the tast of water, but will drink unsweetened ice tea with splenda. Pt reports that she doesnt usually drink any water in a day, but offered that she could drink plain water with meals (lunch and dinners) and would like to make that her next goal.    Expected Outcome  will drink 2 glasses of water per day in addition to her other beverages.       Psychosocial: Target Goals: Acknowledge presence or absence of significant depression and/or stress, maximize coping skills, provide positive support system. Participant is able to verbalize types and ability to use techniques and skills needed for reducing stress and depression.   Initial Review & Psychosocial Screening: Initial Psych Review & Screening - 10/27/18 11-21-16      Initial Review   Current issues with  None Identified      Family Dynamics   Good Support System?  Yes   FAmily   Comments  Husband passed away  11-21-2016. States is doing well today, is active at [employer].       Barriers   Psychosocial barriers to participate in program  There are no identifiable barriers or psychosocial needs.;The patient should benefit from training in stress management and relaxation.      Screening Interventions   Interventions  Encouraged to exercise;To provide support and resources with identified psychosocial needs;Provide feedback about the scores to participant    Expected Outcomes  Short Term goal: Utilizing psychosocial counselor, staff and physician to assist with identification of specific Stressors or current issues interfering with healing process. Setting desired goal for each stressor or current issue identified.;Long Term Goal: Stressors or current issues are controlled or eliminated.;Short Term goal: Identification and review with participant of any Quality of Life or Depression concerns found by scoring the questionnaire.;Long Term goal: The participant improves quality of Life and PHQ9 Scores as seen by post scores and/or verbalization of changes       Quality of Life Scores:  Quality of Life - 11/07/18 0948      Quality of Life   Select  Quality of Life      Quality of Life Scores   Health/Function Pre  22.11 %    Socioeconomic Pre  21.67 %    Psych/Spiritual Pre  14.2 %    Family Pre  20 %    GLOBAL Pre  20.35 %      Scores of 19 and below usually indicate a poorer quality of life in these areas.  A difference of  2-3 points is a clinically meaningful difference.  A difference of 2-3 points in the total score of the Quality of Life Index has been associated with significant improvement in overall quality of life, self-image, physical symptoms, and general health in studies assessing change in quality of life.  PHQ-9: Recent Review Flowsheet Data    Depression screen [facility] 2/9 10/27/2018 09/19/2017 07/18/2017   Decreased Interest 0 0 0   Down, Depressed, Hopeless 1 1 0   PHQ - 2 Score 1 1 0    Altered sleeping 0 1 1    Tired, decreased energy 0 1 3   Change in appetite 0 1 2   Feeling bad or failure about yourself  0  0 0   Trouble concentrating 0 0 0   Moving slowly or fidgety/restless 0 1 0   Suicidal thoughts 0 0 0   PHQ-9 Score '1 5 6   '$ Difficult doing work/chores Not difficult at all - Not difficult at all     Interpretation of Total Score  Total Score Depression Severity:  1-4 = Minimal depression, 5-9 = Mild depression, 10-14 = Moderate depression, 15-19 = Moderately severe depression, 20-27 = Severe depression   Psychosocial Evaluation and Intervention: Psychosocial Evaluation - 11/26/18 1631      Psychosocial Evaluation & Interventions   Interventions  Encouraged to exercise with the program and follow exercise prescription    Comments  counselor Learta Codding charting in [place] [facility]'s session: counselor called client to complete initial assessment.  She spoke of past participation in program and how since she has had more stents put in she is back in the program.  She expressed gratitude for her son's involvement in her life and how she can see his apartment from her balcony.  No concerns to report regarding sleep or appetite.  Patient stated that she does not have any additional health concerns to report.  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  Since his death, she sees purpose in sharing her experiences with others.    Expected Outcomes  Short term goal: lose weight; Long term goal: determine manageable self-care and find balance in her life    Continue Psychosocial Services   Follow up required by staff       Psychosocial Re-Evaluation: Psychosocial Re-Evaluation    [place] Name 11/21/18 0821 12/16/18 1403 12/29/18 1030 01/19/19 1243       Psychosocial Re-Evaluation   Current issues with  Current Stress Concerns  Current Stress Concerns  Current Stress Concerns;Current Depression  Current Stress Concerns;Current  Depression    Comments  [name] is doing well mentally.  She is in a good place.  She is sleeping well despite her sleep apnea and doesn't wear her CPAP.  She has not had any symptoms of depression and paper work from her husband is in order now.   [name] is doing well mentally.  She is doing her best to maintain composure during all of this.  She is getting out to walk each day.  She admits to having moments of feeling like the walls are closing in, but that's when she goes outside for a moment to release  [name] is not doing as well now.  She is starting to get tired of staring at the same four walls. She was talking with her brother this morning about it and how she has lost the motivation to exercise.  I passed this on to our counselor to reach out to her as well.  Today's email was on stress and I encouraged [name] to check it out and to try to get in more exercise again to help.  I think our call helped boosted her mood some.   [name] is doing better.  She is excited about things starting to reopen and being able to hug her grandkids again.  She is eager to get back outside as well.  She can see the light at the end of the tunnel now!!    Expected Outcomes  Short: Start to add in more exercise at home for mental health.  Long: Continue to stay positive.   Short: Continue to walk and get outside to stay  sane. Long: Continue to stay positive  Short: Exercise for a mood boost!  Long: Continue to cope.   Short: Continue to get out to exercise more  Long: Continue to maintain connections.     Interventions  Encouraged to attend Cardiac Rehabilitation for the exercise  Encouraged to attend Cardiac Rehabilitation for the exercise  Encouraged to attend Cardiac Rehabilitation for the exercise  Encouraged to attend Cardiac Rehabilitation for the exercise    Continue Psychosocial Services   Follow up required by staff  Follow up required by staff  Follow up required by staff  Follow up required by staff       Psychosocial  Discharge (Final Psychosocial Re-Evaluation): Psychosocial Re-Evaluation - 01/19/19 1243      Psychosocial Re-Evaluation   Current issues with  Current Stress Concerns;Current Depression    Comments  [name] is doing better.  She is excited about things starting to reopen and being able to hug her grandkids again.  She is eager to get back outside as well.  She can see the light at the end of the tunnel now!!    Expected Outcomes  Short: Continue to get out to exercise more  Long: Continue to maintain connections.     Interventions  Encouraged to attend Cardiac Rehabilitation for the exercise    Continue Psychosocial Services   Follow up required by staff       Vocational Rehabilitation: Provide vocational rehab assistance to qualifying candidates.   Vocational Rehab Evaluation & Intervention: Vocational Rehab - 10/27/18 1221      Initial Vocational Rehab Evaluation & Intervention   Assessment shows need for Vocational Rehabilitation  No       Education: Education Goals: Education classes will be provided on a variety of topics geared toward better understanding of heart health and risk factor modification. Participant will state understanding/return demonstration of topics presented as noted by education test scores.  Learning Barriers/Preferences: Learning Barriers/Preferences - 10/27/18 1220      Learning Barriers/Preferences   Learning Barriers  None    Learning Preferences  Skilled Demonstration   Likes to learn with hands on      Education Topics:  AED/CPR: - Group verbal and written instruction with the use of models to demonstrate the basic use of the AED with the basic ABC's of resuscitation.   General Nutrition Guidelines/Fats and Fiber: -Group instruction provided by verbal, written material, models and posters to present the general guidelines for heart healthy nutrition. Gives an explanation and review of dietary fats and fiber.   Cardiac Rehab from 11/17/2018 in  [facility] Cardiac and Pulmonary Rehab  Date  11/17/18  Educator  [facility]  Instruction Review Code  1- Verbalizes Understanding      Controlling Sodium/Reading Food Labels: -Group verbal and written material supporting the discussion of sodium use in heart healthy nutrition. Review and explanation with models, verbal and written materials for utilization of the food label.   Cardiac Rehab from 10/08/2017 in [facility] Cardiac and Pulmonary Rehab  Date  08/27/17  Educator  CR  Instruction Review Code  1- Verbalizes Understanding      Exercise Physiology & General Exercise Guidelines: - Group verbal and written instruction with models to review the exercise physiology of the cardiovascular system and associated critical values. Provides general exercise guidelines with specific guidelines to those with heart or lung disease.    Cardiac Rehab from 10/08/2017 in [facility] Cardiac and Pulmonary Rehab  Date  09/05/17  Educator  [facility]  Instruction  Review Code  1- Verbalizes Understanding      Aerobic Exercise & Resistance Training: - Gives group verbal and written instruction on the various components of exercise. Focuses on aerobic and resistive training programs and the benefits of this training and how to safely progress through these programs..   Cardiac Rehab from 10/08/2017 in [facility] Cardiac and Pulmonary Rehab  Date  09/12/17  Educator  [facility]  Instruction Review Code  1- Verbalizes Understanding      Flexibility, Balance, Mind/Body Relaxation: Provides group verbal/written instruction on the benefits of flexibility and balance training, including mind/body exercise modes such as yoga, pilates and tai chi.  Demonstration and skill practice provided.   Cardiac Rehab from 10/08/2017 in [facility] Cardiac and Pulmonary Rehab  Date  09/17/17  Educator  AS  Instruction Review Code  1- Verbalizes Understanding      Stress and Anxiety: - Provides group verbal and written instruction about the health risks of elevated  stress and causes of high stress.  Discuss the correlation between heart/lung disease and anxiety and treatment options. Review healthy ways to manage with stress and anxiety.   Cardiac Rehab from 10/08/2017 in [facility] Cardiac and Pulmonary Rehab  Date  10/01/17  Educator  [facility]  Instruction Review Code  1- Verbalizes Understanding      Depression: - Provides group verbal and written instruction on the correlation between heart/lung disease and depressed mood, treatment options, and the stigmas associated with seeking treatment.   Cardiac Rehab from 11/17/2018 in [facility] Cardiac and Pulmonary Rehab  Date  11/11/18  Educator  [facility]  Instruction Review Code  5- Refused Teaching [had class previously]      Anatomy & Physiology of the Heart: - Group verbal and written instruction and models provide basic cardiac anatomy and physiology, with the coronary electrical and arterial systems. Review of Valvular disease and Heart Failure   Cardiac Rehab from 10/08/2017 in [facility] Cardiac and Pulmonary Rehab  Date  10/03/17  Educator  CE  Instruction Review Code  1- Verbalizes Understanding      Cardiac Procedures: - Group verbal and written instruction to review commonly prescribed medications for heart disease. Reviews the medication, class of the drug, and side effects. Includes the steps to properly store meds and maintain the prescription regimen. (beta blockers and nitrates)   Cardiac Rehab from 10/08/2017 in [facility] Cardiac and Pulmonary Rehab  Date  07/30/17  Educator  SB  Instruction Review Code  1- Verbalizes Understanding      Cardiac Medications I: - Group verbal and written instruction to review commonly prescribed medications for heart disease. Reviews the medication, class of the drug, and side effects. Includes the steps to properly store meds and maintain the prescription regimen.   Cardiac Rehab from 10/08/2017 in [facility] Cardiac and Pulmonary Rehab  Date  09/24/17  Educator  SB  Instruction Review  Code  1- Verbalizes Understanding      Cardiac Medications II: -Group verbal and written instruction to review commonly prescribed medications for heart disease. Reviews the medication, class of the drug, and side effects. (all other drug classes)   Cardiac Rehab from 11/17/2018 in [facility] Cardiac and Pulmonary Rehab  Date  11/10/18  Educator  SB  Instruction Review Code  1- Verbalizes Understanding       Go Sex-Intimacy & Heart Disease, Get SMART - Goal Setting: - Group verbal and written instruction through game format to discuss heart disease and the return to sexual intimacy. Provides group verbal and  written material to discuss and apply goal setting through the application of the S.M.A.R.T. Method.   Cardiac Rehab from 10/08/2017 in [facility] Cardiac and Pulmonary Rehab  Date  07/30/17  Educator  SB  Instruction Review Code  1- Verbalizes Understanding      Other Matters of the Heart: - Provides group verbal, written materials and models to describe Stable Angina and Peripheral Artery. Includes description of the disease process and treatment options available to the cardiac patient.   Cardiac Rehab from 10/08/2017 in [facility] Cardiac and Pulmonary Rehab  Date  10/03/17  Educator  CE  Instruction Review Code  1- Verbalizes Understanding      Exercise & Equipment Safety: - Individual verbal instruction and demonstration of equipment use and safety with use of the equipment.   Cardiac Rehab from 11/17/2018 in [facility] Cardiac and Pulmonary Rehab  Date  10/27/18  Educator  SB  Instruction Review Code  1- Verbalizes Understanding      Infection Prevention: - Provides verbal and written material to individual with discussion of infection control including proper hand washing and proper equipment cleaning during exercise session.   Cardiac Rehab from 11/17/2018 in [facility] Cardiac and Pulmonary Rehab  Date  10/27/18  Educator  SB  Instruction Review Code  1- Verbalizes Understanding      Falls  Prevention: - Provides verbal and written material to individual with discussion of falls prevention and safety.   Cardiac Rehab from 11/17/2018 in [facility] Cardiac and Pulmonary Rehab  Date  10/27/18  Educator  SB  Instruction Review Code  1- Verbalizes Understanding      Diabetes: - Individual verbal and written instruction to review signs/symptoms of diabetes, desired ranges of glucose level fasting, after meals and with exercise. Acknowledge that pre and post exercise glucose checks will be done for 3 sessions at entry of program.   Cardiac Rehab from 10/08/2017 in [facility] Cardiac and Pulmonary Rehab  Date  07/18/17  Educator  Sb  Instruction Review Code  1- Verbalizes Understanding      Know Your Numbers and Risk Factors: -Group verbal and written instruction about important numbers in your health.  Discussion of what are risk factors and how they play a role in the disease process.  Review of Cholesterol, Blood Pressure, Diabetes, and BMI and the role they play in your overall health.   Cardiac Rehab from 11/17/2018 in [facility] Cardiac and Pulmonary Rehab  Date  11/10/18  Educator  SB  Instruction Review Code  1- Verbalizes Understanding      Sleep Hygiene: -Provides group verbal and written instruction about how sleep can affect your health.  Define sleep hygiene, discuss sleep cycles and impact of sleep habits. Review good sleep hygiene tips.    Other: -Provides group and verbal instruction on various topics (see comments)   Knowledge Questionnaire Score: Knowledge Questionnaire Score - 11/07/18 0946      Knowledge Questionnaire Score   Pre Score  25/26   test reviewed with patient today      Core Components/Risk Factors/Patient Goals at Admission: Personal Goals and Risk Factors at Admission - 10/27/18 1221      Core Components/Risk Factors/Patient Goals on Admission    Weight Management  Yes;Weight Loss    Intervention  Weight Management: Develop a combined nutrition and  exercise program designed to reach desired caloric intake, while maintaining appropriate intake of nutrient and fiber, sodium and fats, and appropriate energy expenditure required for the weight goal.;Weight Management/Obesity: Establish reasonable short  term and long term weight goals.   HAs been losing weight on the Keto Diet   Admit Weight  208 lb (94.3 kg)    Goal Weight: Short Term  206 lb (93.4 kg)    Goal Weight: Long Term  200 lb (90.7 kg)    Expected Outcomes  Short Term: Continue to assess and modify interventions until short term weight is achieved;Long Term: Adherence to nutrition and physical activity/exercise program aimed toward attainment of established weight goal;Weight Loss: Understanding of general recommendations for a balanced deficit meal plan, which promotes 1-2 lb weight loss per week and includes a negative energy balance of [phone] kcal/d    Diabetes  Yes    Intervention  Provide education about signs/symptoms and action to take for hypo/hyperglycemia.;Provide education about proper nutrition, including hydration, and aerobic/resistive exercise prescription along with prescribed medications to achieve blood glucose in normal ranges: Fasting glucose 65-99 mg/dL    Expected Outcomes  Short Term: Participant verbalizes understanding of the signs/symptoms and immediate care of hyper/hypoglycemia, proper foot care and importance of medication, aerobic/resistive exercise and nutrition plan for blood glucose control.;Long Term: Attainment of HbA1C < 7%.    Hypertension  Yes    Intervention  Provide education on lifestyle modifcations including regular physical activity/exercise, weight management, moderate sodium restriction and increased consumption of fresh fruit, vegetables, and low fat dairy, alcohol moderation, and smoking cessation.;Monitor prescription use compliance.    Expected Outcomes  Short Term: Continued assessment and intervention until BP is < 140/63m HG in  hypertensive participants. < 130/867mHG in hypertensive participants with diabetes, heart failure or chronic kidney disease.;Long Term: Maintenance of blood pressure at goal levels.    Lipids  Yes    Intervention  Provide education and support for participant on nutrition & aerobic/resistive exercise along with prescribed medications to achieve LDL '70mg'$ , HDL >'40mg'$ .    Expected Outcomes  Short Term: Participant states understanding of desired cholesterol values and is compliant with medications prescribed. Participant is following exercise prescription and nutrition guidelines.;Long Term: Cholesterol controlled with medications as prescribed, with individualized exercise RX and with personalized nutrition plan. Value goals: LDL < '70mg'$ , HDL > 40 mg.       Core Components/Risk Factors/Patient Goals Review:  Goals and Risk Factor Review    [place] Name 11/21/18 0810254/07/20 1407 12/29/18 1033 01/19/19 1250       Core Components/Risk Factors/Patient Goals Review   Personal Goals Review  Diabetes;Hypertension;Heart Failure;Weight Management/Obesity;Lipids  Diabetes;Hypertension;Heart Failure;Weight Management/Obesity;Lipids  Diabetes;Hypertension;Heart Failure;Weight Management/Obesity;Lipids  Diabetes;Hypertension;Heart Failure;Weight Management/Obesity;Lipids    Review  AnLelon Frohlichas been doing well in rehab.  She is stuck at 210 lbs.  She is breathing better and has more energy.   She is going to work on adding in more exercise and changing up her diet to help with weight loss.  She denies any heart failure symptoms currently.  Her blood pressures have been good and she checks them on occasion at home.  She will start to check them once a day.  Her sugars are still not great and she does check them twice a day.  She will talk with Melissa for some guideance. Overall, she is doing well on her medications. Sent EMMI edcuation on diabetes for her to review as well.   AnLelon Frohlichs doing well at home.  Her weight is  staying steady.  Her blood pressures continue to do well.  All her other numbers are staying steady as well.  She denies any heart failure  symptoms at this time.  We will continue to check in.   [name] is not doing as well at home now.  Her weight is up some as she has been getting into comfort food more and not exercising.  She had a follow up last week with her endocinologist and her A1c was back up to 8 to they increased her insulin.  She would like to get things back under control again.  Her pressures have been good.  She is doing good health wise.    [name] has been able to keep her weight from going up too much.  She is back on a modified keto diet with healthier fat options.   She is hoping this will help her overall as well.     Expected Outcomes  Short: Check blood pressures more frequently. Complete EMMI education for diabetes.  Long: Continue to work on weight loss.   Short: Continue to work on weight.  Long: Continue to manage her heart failure.   Short: Cut back on comfort food.  Long: Continue to work on risk factors.   Short: Continue to follow diet.  Long: Continue to work on weight loss.        Core Components/Risk Factors/Patient Goals at Discharge (Final Review):  Goals and Risk Factor Review - 01/19/19 1250      Core Components/Risk Factors/Patient Goals Review   Personal Goals Review  Diabetes;Hypertension;Heart Failure;Weight Management/Obesity;Lipids    Review  [name] has been able to keep her weight from going up too much.  She is back on a modified keto diet with healthier fat options.   She is hoping this will help her overall as well.     Expected Outcomes  Short: Continue to follow diet.  Long: Continue to work on weight loss.        ITP Comments: ITP Comments    [place] Name 10/27/18 1206 11/05/18 0553 11/07/18 0945 11/20/18 1028 11/25/18 1429   ITP Comments  Medical review completed today. INitial ITP sent to Dr Loleta Chance for review,changes as needed and signature. Documentation of  diagnosis can be found in Care Everywhere1/28/2020  30 day review. Continue with ITP unless directed changes by Medical Director chart review. HAs attended orientation, wather and illness has delayed return.  [name] brought back her paperwork with her today.   Primary Cardiologist is now Dr. Saralyn Pilar  Our program is currently closed due to COVID-19.  We are communicating with patient via phone calls and emails.    [place] Name 12/03/18 1208 03/25/19 1337         ITP Comments   30 day review. Continue with ITP unless directed changes by Medical Director chart review.  30 day review cycle restarting  after being closed since March 16 because of  Covid 19 pandemic. Program opened to patients on July 6. Not all have returned. ITP updated and sent to Medical Director for review,changes as needed and signature         Comments:

## 2019-04-10 ENCOUNTER — Encounter: Payer: Self-pay | Admitting: *Deleted

## 2019-04-10 NOTE — Progress Notes (Signed)
Spoke with [name] today and she wants to continue with the Virtual Cardiac Rehab Sessions

## 2019-05-20 ENCOUNTER — Encounter: Payer: Self-pay | Admitting: *Deleted

## 2019-05-20 DIAGNOSIS — Z955 Presence of coronary angioplasty implant and graft: Secondary | ICD-10-CM

## 2019-05-20 NOTE — Progress Notes (Signed)
Cardiac Individual Treatment Plan  Patient Details  Name: Tammy Hatfield MRN: [id number] Date of Birth: 02/10/1946 Referring Provider:     Cardiac Rehab from 10/27/2018 in [facility] Cardiac and Pulmonary Rehab  Referring Provider  [name] MD      Initial Encounter Date:    Cardiac Rehab from 10/27/2018 in [facility] Cardiac and Pulmonary Rehab  Date  10/27/18      Visit Diagnosis: Status post coronary artery stent placement  Patient's Home Medications on Admission:  Current Outpatient Medications:    ALPRAZolam (XANAX) 0.5 MG tablet, Take 0.5 mg by mouth 2 (two) times daily., Disp: , Rfl:    aspirin EC 81 MG tablet, Take 81 mg by mouth daily., Disp: , Rfl:    Carboxymethylcellul-Glycerin (LUBRICATING EYE DROPS OP), Place 1 drop into both eyes 2 (two) times daily., Disp: , Rfl:    Cholecalciferol (VITAMIN D3) 25 MCG (1000 UT) CAPS, Take 1,000 Units by mouth daily., Disp: , Rfl:    clopidogrel (PLAVIX) 75 MG tablet, Take 75 mg by mouth every evening. , Disp: , Rfl:    fexofenadine (ALLEGRA) 180 MG tablet, Take 180 mg by mouth daily., Disp: , Rfl:    furosemide (LASIX) 20 MG tablet, Take 20 mg by mouth daily., Disp: , Rfl:    glipiZIDE (GLUCOTROL) 10 MG tablet, Take 10 mg by mouth 2 (two) times daily. , Disp: , Rfl:    ibuprofen (ADVIL,MOTRIN) 200 MG tablet, Take 400 mg by mouth daily as needed for headache or moderate pain., Disp: , Rfl:    insulin NPH-regular Human (NOVOLIN 70/30) (70-30) 100 UNIT/ML injection, Inject 25 Units into the skin 2 (two) times daily with a meal. , Disp: , Rfl:    losartan (COZAAR) 50 MG tablet, Take 50 mg by mouth every morning. , Disp: , Rfl:    metoprolol succinate (TOPROL-XL) 25 MG 24 hr tablet, Take 25 mg by mouth daily. , Disp: , Rfl:    nitroGLYCERIN (NITROSTAT) 0.4 MG SL tablet, Place 1 tablet (0.4 mg total) under the tongue every 5 (five) minutes as needed for chest pain., Disp: 30 tablet, Rfl: 0   pantoprazole (PROTONIX) 20 MG  tablet, Take 20 mg by mouth daily., Disp: , Rfl:    PARoxetine (PAXIL) 40 MG tablet, Take 40 mg by mouth 2 (two) times daily. , Disp: , Rfl:    rosuvastatin (CRESTOR) 40 MG tablet, Take 40 mg by mouth every evening. , Disp: , Rfl:    traMADol (ULTRAM) 50 MG tablet, Take 50-100 mg by mouth 2 (two) times daily as needed for moderate pain. , Disp: , Rfl:    valACYclovir (VALTREX) 500 MG tablet, Take 500 mg by mouth 2 (two) times daily as needed (fever blisters). , Disp: , Rfl:   Past Medical History: Past Medical History:  Diagnosis Date   Anginal pain ([place])    Anxiety    Arthritis    knees   Barrett's esophagus    Breast cancer ([place]) 2014   RT LUMPECTOMY   Cancer ([place]) 2014   right breast - s\p Radiation and lumpectomy and LN dissection   Chronic sinusitis    Complication of anesthesia    hard to wake after Fentanyl   COPD (chronic obstructive pulmonary disease) ([place]) 2012   Coronary artery disease    Degenerative lumbar disc    Diabetes mellitus without complication (HCC)    Dysphagia    Esophageal reflux    Fatty liver    History of Helicobacter  pylori infection    Hypercholesteremia    Hyperlipidemia    Hypertension    Personal history of radiation therapy    Pneumonia    Radiation 2014   RT BREAST CA   Sleep apnea    CPAP   Ulcer, stomach peptic    history of   Vertigo    last episode 2017(approx)    Tobacco Use: Social History   Tobacco Use  Smoking Status Former Smoker   Packs/day: 2.00   Years: 30.00   Pack years: 60.00   Quit date: 02/01/2000   Years since quitting: 19.3  Smokeless Tobacco Never Used  Tobacco Comment   07/18/2017 former smoker quit 1999    Labs: Recent Review Scientist, physiological    Labs for ITP Cardiac and Pulmonary Rehab Latest Ref Rng & Units 02/11/2015 07/05/2017   Cholestrol 0 - 200 mg/dL - 255(H)   LDLCALC 0 - 99 mg/dL - 147(H)   HDL >40 mg/dL - 62   Trlycerides <150 mg/dL - 231(H)   Hemoglobin A1c  4.0 - 6.0 % 7.4(H) -       Exercise Target Goals: Exercise Program Goal: Individual exercise prescription set using results from initial 6 min walk test and THRR while considering  patients activity barriers and safety.   Exercise Prescription Goal: Initial exercise prescription builds to 30-45 minutes a day of aerobic activity, 2-3 days per week.  Home exercise guidelines will be given to patient during program as part of exercise prescription that the participant will acknowledge.  Activity Barriers & Risk Stratification:   6 Minute Walk:   Oxygen Initial Assessment:   Oxygen Re-Evaluation:   Oxygen Discharge (Final Oxygen Re-Evaluation):   Initial Exercise Prescription:   Perform Capillary Blood Glucose checks as needed.  Exercise Prescription Changes: Exercise Prescription Changes    Row Name 11/25/18 1400             Response to Exercise   Blood Pressure (Admit)  124/70       Blood Pressure (Exercise)  120/82       Blood Pressure (Exit)  104/74       Heart Rate (Admit)  99 bpm       Heart Rate (Exercise)  103 bpm       Heart Rate (Exit)  90 bpm       Rating of Perceived Exertion (Exercise)  13       Symptoms  none       Duration  Continue with 45 min of aerobic exercise without signs/symptoms of physical distress.       Intensity  THRR unchanged         Progression   Progression  Continue to progress workloads to maintain intensity without signs/symptoms of physical distress.       Average METs  2.77         Resistance Training   Training Prescription  Yes       Weight  3 lbs       Reps  10-15         Interval Training   Interval Training  No         Treadmill   MPH  2       Grade  1       Minutes  15       METs  2.81         NuStep   Level  2       Minutes  15  METs  3.4         Arm Ergometer   Level  2       Minutes  15       METs  2.1         Home Exercise Plan   Plans to continue exercise at  Home (comment) walking,  treadmill, recumbent bike       Frequency  Add 2 additional days to program exercise sessions.       Initial Home Exercises Provided  11/21/18          Exercise Comments:   Exercise Goals and Review:   Exercise Goals Re-Evaluation : Exercise Goals Re-Evaluation    Row Name 11/25/18 1429 12/16/18 1359 12/29/18 1025 01/19/19 1242       Exercise Goal Re-Evaluation   Exercise Goals Review  Increase Physical Activity;Increase Strength and Stamina;Understanding of Exercise Prescription  Increase Physical Activity;Increase Strength and Stamina;Understanding of Exercise Prescription  Increase Physical Activity;Increase Strength and Stamina;Understanding of Exercise Prescription  Increase Physical Activity;Increase Strength and Stamina;Understanding of Exercise Prescription    Comments  Tammy Hatfield continues to do well in rehab.  She is planning to go to her gym in complex with her exercise packet.  We will continue to monitor her progress at home.   Tammy Hatfield is doing well at home.  She is getting out to exercise daily.  Today, she has already gotten over 3,000 steps.  She walks around her neighborhood every day.  She is also staying active around the house as she clean each room.   Tammy Hatfield is not doing as well at home. She has lost her motivation to get out to exercise.  I encouraged her to watch the videos again to help get her motivated again.  I have also shared this with the mental health counselor to reach out to her.  Tammy Hatfield said she would try the videos again.Tammy Hatfield is doing better at home.  She is looking forward to the nice weather this week to be able to get out to go walk again.      Expected Outcomes  Short: Continue to exercise.  Long: Continue to increase strength and stamina.   Short: Continue wto walk daily.  Long: Continue to stay active.   Short: Try videos again and exercise more for boost.  Long: Continue to rebuild strength and stamina.   Short: Continue to try to get out to go walk more.  Long:  Continue to add activity back in.        Discharge Exercise Prescription (Final Exercise Prescription Changes): Exercise Prescription Changes - 11/25/18 1400      Response to Exercise   Blood Pressure (Admit)  124/70    Blood Pressure (Exercise)  120/82    Blood Pressure (Exit)  104/74    Heart Rate (Admit)  99 bpm    Heart Rate (Exercise)  103 bpm    Heart Rate (Exit)  90 bpm    Rating of Perceived Exertion (Exercise)  13    Symptoms  none    Duration  Continue with 45 min of aerobic exercise without signs/symptoms of physical distress.    Intensity  THRR unchanged      Progression   Progression  Continue to progress workloads to maintain intensity without signs/symptoms of physical distress.    Average METs  2.77      Resistance Training   Training Prescription  Yes    Weight  3 lbs    Reps  10-15      Interval Training   Interval Training  No      Treadmill   MPH  2    Grade  1    Minutes  15    METs  2.81      NuStep   Level  2    Minutes  15    METs  3.4      Arm Ergometer   Level  2    Minutes  15    METs  2.1      Home Exercise Plan   Plans to continue exercise at  Home (comment)   walking, treadmill, recumbent bike   Frequency  Add 2 additional days to program exercise sessions.    Initial Home Exercises Provided  11/21/18       Nutrition:  Target Goals: Understanding of nutrition guidelines, daily intake of sodium <1547m, cholesterol <2031m calories 30% from fat and 7% or less from saturated fats, daily to have 5 or more servings of fruits and vegetables.  Biometrics:    Nutrition Therapy Plan and Nutrition Goals: Nutrition Therapy & Goals - 12/03/18 1409      Nutrition Therapy   Diet  Diabetic Heart Healthy Low Sodium 1500-1700kcal    Protein (specify units)  75-85g    Fiber  25 grams    Whole Grain Foods  3 servings    Saturated Fats  12 max. grams    Fruits and Vegetables  5 servings/day    Sodium  1.5 grams      Personal  Nutrition Goals   Nutrition Goal  ST: choosing healthy snacks (protein, CHO, fat) LT: Work on [employer]  Comments  Discussed modified diabetic MyPlate, discussed soluble fiber and the importance of CHO and plants. pt typically tires to eat healthy, eats oatmeal in the morning with skim milk (changing to almond milk) and eggs. discussed diet recommendations and how to lower triglycerides and cholesterol. Discussed Na recommendations and what to look for as well as how to break that up in a day.       Intervention Plan   Intervention  Nutrition handout(s) given to patient.    Expected Outcomes  Short Term Goal: Understand basic principles of dietary content, such as calories, fat, sodium, cholesterol and nutrients.;Short Term Goal: A plan has been developed with personal nutrition goals set during dietitian appointment.;Long Term Goal: Adherence to prescribed nutrition plan.       Nutrition Assessments:   Nutrition Goals Re-Evaluation: Nutrition Goals Re-Evaluation    RoCowename 02/23/19 1228             Goals   Nutrition Goal  ST: drink a glass of water with lunch and dinner. LT: work on HHDaconoWill eat berries (sometimes puts splenda on her strawberries) and unsalted nuts as a snack. Will get drink enhancers becuase she doesn't like the tast of water, but will drink unsweetened ice tea with splenda. Pt reports that she doesnt usually drink any water in a day, but offered that she could drink plain water with meals (lunch and dinners) and would like to make that her next goal.       Expected Outcome  will drink 2 glasses of water per day in addition to her other beverages.          Nutrition Goals Discharge (Final Nutrition Goals Re-Evaluation): Nutrition Goals Re-Evaluation - 02/23/19 1228  Goals   Nutrition Goal  ST: drink a glass of water with lunch and dinner. LT: work on [place]  Will eat berries (sometimes puts splenda on her strawberries) and  unsalted nuts as a snack. Will get drink enhancers becuase she doesn't like the tast of water, but will drink unsweetened ice tea with splenda. Pt reports that she doesnt usually drink any water in a day, but offered that she could drink plain water with meals (lunch and dinners) and would like to make that her next goal.    Expected Outcome  will drink 2 glasses of water per day in addition to her other beverages.       Psychosocial: Target Goals: Acknowledge presence or absence of significant depression and/or stress, maximize coping skills, provide positive support system. Participant is able to verbalize types and ability to use techniques and skills needed for reducing stress and depression.   Initial Review & Psychosocial Screening:   Quality of Life Scores:   Scores of 19 and below usually indicate a poorer quality of life in these areas.  A difference of  2-3 points is a clinically meaningful difference.  A difference of 2-3 points in the total score of the Quality of Life Index has been associated with significant improvement in overall quality of life, self-image, physical symptoms, and general health in studies assessing change in quality of life.  PHQ-9: Recent Review Flowsheet Data    Depression screen [facility] 2/9 10/27/2018 09/19/2017 07/18/2017   Decreased Interest 0 0 0   Down, Depressed, Hopeless 1 1 0   PHQ - 2 Score 1 1 0   Altered sleeping 0 1 1    Tired, decreased energy 0 1 3   Change in appetite 0 1 2   Feeling bad or failure about yourself  0 0 0   Trouble concentrating 0 0 0   Moving slowly or fidgety/restless 0 1 0   Suicidal thoughts 0 0 0   PHQ-9 Score _0 Difficult doing work/chores Not difficult at all - Not difficult at all     Interpretation of Total Score  Total Score Depression Severity:  1-4 = Minimal depression, 5-9 = Mild depression, 10-14 = Moderate depression, 15-19 = Moderately severe depression, 20-27 = Severe depression   Psychosocial  Evaluation and Intervention: Psychosocial Evaluation - 11/26/18 1631      Psychosocial Evaluation & Interventions   Interventions  Encouraged to exercise with the program and follow exercise prescription    Comments  counselor Learta Codding charting in Arvin H's session: counselor called client to complete initial assessment.  She spoke of past participation in program and how since she has had more stents put in she is back in the program.  She expressed gratitude for her son's involvement in her life and how she can see his apartment from her balcony.  No concerns to report regarding sleep or appetite.  Patient stated that she does not have any additional health concerns to report.  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  Since his death, she sees purpose in sharing her experiences with others.    Expected Outcomes  Short term goal: lose weight; Long term goal: determine manageable self-care and find balance in her life    Continue Psychosocial Services   Follow up required by staff       Psychosocial Re-Evaluation: Psychosocial Re-Evaluation  [place] Name 12/16/18 1403 12/29/18 1030 01/19/19 1243         Psychosocial Re-Evaluation   Current issues with  Current Stress Concerns  Current Stress Concerns;Current Depression  Current Stress Concerns;Current Depression     Comments  Tammy Hatfield is doing well mentally.  She is doing her best to maintain composure during all of this.  She is getting out to walk each day.  She admits to having moments of feeling like the walls are closing in, but that's when she goes outside for a moment to release  Tammy Hatfield is not doing as well now.  She is starting to get tired of staring at the same four walls. She was talking with her brother this morning about it and how she has lost the motivation to exercise.  I passed this on to our counselor to reach out to her as well.  Today's email was on stress and I encouraged [name] to  check it out and to try to get in more exercise again to help.  I think our call helped boosted her mood some.   Tammy Hatfield is doing better.  She is excited about things starting to reopen and being able to hug her grandkids again.  She is eager to get back outside as well.  She can see the light at the end of the tunnel now!!     Expected Outcomes  Short: Continue to walk and get outside to stay sane. Long: Continue to stay positive  Short: Exercise for a mood boost!  Long: Continue to cope.   Short: Continue to get out to exercise more  Long: Continue to maintain connections.      Interventions  Encouraged to attend Cardiac Rehabilitation for the exercise  Encouraged to attend Cardiac Rehabilitation for the exercise  Encouraged to attend Cardiac Rehabilitation for the exercise     Continue Psychosocial Services   Follow up required by staff  Follow up required by staff  Follow up required by staff        Psychosocial Discharge (Final Psychosocial Re-Evaluation): Psychosocial Re-Evaluation - 01/19/19 1243      Psychosocial Re-Evaluation   Current issues with  Current Stress Concerns;Current Depression    Comments  Tammy Hatfield is doing better.  She is excited about things starting to reopen and being able to hug her grandkids again.  She is eager to get back outside as well.  She can see the light at the end of the tunnel now!!    Expected Outcomes  Short: Continue to get out to exercise more  Long: Continue to maintain connections.     Interventions  Encouraged to attend Cardiac Rehabilitation for the exercise    Continue Psychosocial Services   Follow up required by staff       Vocational Rehabilitation: Provide vocational rehab assistance to qualifying candidates.   Vocational Rehab Evaluation & Intervention:   Education: Education Goals: Education classes will be provided on a variety of topics geared toward better understanding of heart health and risk factor modification. Participant will state  understanding/return demonstration of topics presented as noted by education test scores.  Learning Barriers/Preferences:   Education Topics:  AED/CPR: - Group verbal and written instruction with the use of models to demonstrate the basic use of the AED with the basic ABC's of resuscitation.   General Nutrition Guidelines/Fats and Fiber: -Group instruction provided by verbal, written material, models and posters to present the general guidelines for heart healthy nutrition. Gives an explanation and review  of dietary fats and fiber.   Cardiac Rehab from 11/17/2018 in [facility] Cardiac and Pulmonary Rehab  Date  11/17/18  Educator  [facility]  Instruction Review Code  1- Verbalizes Understanding      Controlling Sodium/Reading Food Labels: -Group verbal and written material supporting the discussion of sodium use in heart healthy nutrition. Review and explanation with models, verbal and written materials for utilization of the food label.   Cardiac Rehab from 10/08/2017 in [facility] Cardiac and Pulmonary Rehab  Date  08/27/17  Educator  CR  Instruction Review Code  1- Verbalizes Understanding      Exercise Physiology & General Exercise Guidelines: - Group verbal and written instruction with models to review the exercise physiology of the cardiovascular system and associated critical values. Provides general exercise guidelines with specific guidelines to those with heart or lung disease.    Cardiac Rehab from 10/08/2017 in [facility] Cardiac and Pulmonary Rehab  Date  09/05/17  Educator  [facility]  Instruction Review Code  1- Verbalizes Understanding      Aerobic Exercise & Resistance Training: - Gives group verbal and written instruction on the various components of exercise. Focuses on aerobic and resistive training programs and the benefits of this training and how to safely progress through these programs..   Cardiac Rehab from 10/08/2017 in [facility] Cardiac and Pulmonary Rehab  Date  09/12/17  Educator  [facility]    Instruction Review Code  1- Verbalizes Understanding      Flexibility, Balance, Mind/Body Relaxation: Provides group verbal/written instruction on the benefits of flexibility and balance training, including mind/body exercise modes such as yoga, pilates and tai chi.  Demonstration and skill practice provided.   Cardiac Rehab from 10/08/2017 in [facility] Cardiac and Pulmonary Rehab  Date  09/17/17  Educator  AS  Instruction Review Code  1- Verbalizes Understanding      Stress and Anxiety: - Provides group verbal and written instruction about the health risks of elevated stress and causes of high stress.  Discuss the correlation between heart/lung disease and anxiety and treatment options. Review healthy ways to manage with stress and anxiety.   Cardiac Rehab from 10/08/2017 in [facility] Cardiac and Pulmonary Rehab  Date  10/01/17  Educ[facility]  [facility]  Instruction Review Code  1- Verbalizes Understanding      Depression: - Provides group verbal and written instruction on the correlation between heart/lung disease and depressed mood, treatment options, and the stigmas associated with seeking treatment.   Cardiac Rehab from 11/17/2018 in [facility] Cardiac and Pulmonary Rehab  Date  11/11/18  Educator  [facility]  Instruction Review Code  5- Refused Teaching [had class previously]      Anatomy & Physiology of the Heart: - Group verbal and written instruction and models provide basic cardiac anatomy and physiology, with the coronary electrical and arterial systems. Review of Valvular disease and Heart Failure   Cardiac Rehab from 10/08/2017 in [facility] Cardiac and Pulmonary Rehab  Date  10/03/17  Educator  CE  Instruction Review Code  1- Verbalizes Understanding      Cardiac Procedures: - Group verbal and written instruction to review commonly prescribed medications for heart disease. Reviews the medication, class of the drug, and side effects. Includes the steps to properly store meds and maintain the prescription  regimen. (beta blockers and nitrates)   Cardiac Rehab from 10/08/2017 in [facility] Cardiac and Pulmonary Rehab  Date  07/30/17  Educator  SB  Instruction Review Code  1- Verbalizes Understanding  Cardiac Medications I: - Group verbal and written instruction to review commonly prescribed medications for heart disease. Reviews the medication, class of the drug, and side effects. Includes the steps to properly store meds and maintain the prescription regimen.   Cardiac Rehab from 10/08/2017 in [facility] Cardiac and Pulmonary Rehab  Date  09/24/17  Educator  SB  Instruction Review Code  1- Verbalizes Understanding      Cardiac Medications II: -Group verbal and written instruction to review commonly prescribed medications for heart disease. Reviews the medication, class of the drug, and side effects. (all other drug classes)   Cardiac Rehab from 11/17/2018 in [facility] Cardiac and Pulmonary Rehab  Date  11/10/18  Educator  SB  Instruction Review Code  1- Verbalizes Understanding       Go Sex-Intimacy & Heart Disease, Get SMART - Goal Setting: - Group verbal and written instruction through game format to discuss heart disease and the return to sexual intimacy. Provides group verbal and written material to discuss and apply goal setting through the application of the S.M.A.R.T. Method.   Cardiac Rehab from 10/08/2017 in [facility] Cardiac and Pulmonary Rehab  Date  07/30/17  Educator  SB  Instruction Review Code  1- Verbalizes Understanding      Other Matters of the Heart: - Provides group verbal, written materials and models to describe Stable Angina and Peripheral Artery. Includes description of the disease process and treatment options available to the cardiac patient.   Cardiac Rehab from 10/08/2017 in [facility] Cardiac and Pulmonary Rehab  Date  10/03/17  Educator  CE  Instruction Review Code  1- Verbalizes Understanding      Exercise & Equipment Safety: - Individual verbal instruction and  demonstration of equipment use and safety with use of the equipment.   Cardiac Rehab from 11/17/2018 in [facility] Cardiac and Pulmonary Rehab  Date  10/27/18  Educator  SB  Instruction Review Code  1- Verbalizes Understanding      Infection Prevention: - Provides verbal and written material to individual with discussion of infection control including proper hand washing and proper equipment cleaning during exercise session.   Cardiac Rehab from 11/17/2018 in [facility] Cardiac and Pulmonary Rehab  Date  10/27/18  Educator  SB  Instruction Review Code  1- Verbalizes Understanding      Falls Prevention: - Provides verbal and written material to individual with discussion of falls prevention and safety.   Cardiac Rehab from 11/17/2018 in [facility] Cardiac and Pulmonary Rehab  Date  10/27/18  Educator  SB  Instruction Review Code  1- Verbalizes Understanding      Diabetes: - Individual verbal and written instruction to review signs/symptoms of diabetes, desired ranges of glucose level fasting, after meals and with exercise. Acknowledge that pre and post exercise glucose checks will be done for 3 sessions at entry of program.   Cardiac Rehab from 10/08/2017 in [facility] Cardiac and Pulmonary Rehab  Date  07/18/17  Educator  Sb  Instruction Review Code  1- Verbalizes Understanding      Know Your Numbers and Risk Factors: -Group verbal and written instruction about important numbers in your health.  Discussion of what are risk factors and how they play a role in the disease process.  Review of Cholesterol, Blood Pressure, Diabetes, and BMI and the role they play in your overall health.   Cardiac Rehab from 11/17/2018 in [facility] Cardiac and Pulmonary Rehab  Date  11/10/18  Educator  SB  Instruction Review Code  1- [employer]  Understanding      Sleep Hygiene: -Provides group verbal and written instruction about how sleep can affect your health.  Define sleep hygiene, discuss sleep cycles and impact of sleep  habits. Review good sleep hygiene tips.    Other: -Provides group and verbal instruction on various topics (see comments)   Knowledge Questionnaire Score:   Core Components/Risk Factors/Patient Goals at Admission:   Core Components/Risk Factors/Patient Goals Review:  Goals and Risk Factor Review    Row Name 12/16/18 1407 12/29/18 1033 01/19/19 1250         Core Components/Risk Factors/Patient Goals Review   Personal Goals Review  Diabetes;Hypertension;Heart Failure;Weight Management/Obesity;Lipids  Diabetes;Hypertension;Heart Failure;Weight Management/Obesity;Lipids  Diabetes;Hypertension;Heart Failure;Weight Management/Obesity;Lipids     Review  Tammy Hatfield is doing well at home.  Her weight is staying steady.  Her blood pressures continue to do well.  All her other numbers are staying steady as well.  She denies any heart failure symptoms at this time.  We will continue to check in.   Tammy Hatfield is not doing as well at home now.  Her weight is up some as she has been getting into comfort food more and not exercising.  She had a follow up last week with her endocinologist and her A1c was back up to 8 to they increased her insulin.  She would like to get things back under control again.  Her pressures have been good.  She is doing good health wise.    Tammy Hatfield has been able to keep her weight from going up too much.  She is back on a modified keto diet with healthier fat options.   She is hoping this will help her overall as well.      Expected Outcomes  Short: Continue to work on weight.  Long: Continue to manage her heart failure.   Short: Cut back on comfort food.  Long: Continue to work on risk factors.   Short: Continue to follow diet.  Long: Continue to work on weight loss.         Core Components/Risk Factors/Patient Goals at Discharge (Final Review):  Goals and Risk Factor Review - 01/19/19 1250      Core Components/Risk Factors/Patient Goals Review   Personal Goals Review   Diabetes;Hypertension;Heart Failure;Weight Management/Obesity;Lipids    Review  Tammy Hatfield has been able to keep her weight from going up too much.  She is back on a modified keto diet with healthier fat options.   She is hoping this will help her overall as well.     Expected Outcomes  Short: Continue to follow diet.  Long: Continue to work on weight loss.        ITP Comments: ITP Comments    Row Name 11/25/18 1429 12/03/18 1208 03/25/19 1337 04/10/19 1124 05/20/19 1450   ITP Comments  Our program is currently closed due to COVID-19.  We are communicating with patient via phone calls and emails.    30 day review. Continue with ITP unless directed changes by Medical Director chart review.  30 day review cycle restarting  after being closed since March 16 because of  Covid 19 pandemic. Program opened to patients on July 6. Not all have returned. ITP updated and sent to Medical Director for review,changes as needed and signature  Spoke with Tammy Hatfield today and she wants to continue with the Virtual Cardiac Rehab Sessions  Pt never returned once we reopened after the COVID-19 closure.  Discharge ITP and  summary created and sent for review.  Comments: Discharge ITP

## 2019-05-20 NOTE — Progress Notes (Signed)
Discharge Progress Report  Patient Details  Name: Tammy Hatfield MRN: [id number] Date of Birth: 03-Dec-1945 Referring Provider:     Cardiac Rehab from 10/27/2018 in [facility] Cardiac and Pulmonary Rehab  Referring Provider  [name] MD       Number of Visits: 10  Reason for Discharge:  Early Exit:  Lack of attendance  Smoking History:  Social History   Tobacco Use  Smoking Status Former Smoker  . Packs/day: 2.00  . Years: 30.00  . Pack years: 60.00  . Quit date: 02/01/2000  . Years since quitting: 19.3  Smokeless Tobacco Never Used  Tobacco Comment   07/18/2017 former smoker quit 1999    Diagnosis:  Status post coronary artery stent placement  ADL UCSD:   Initial Exercise Prescription:   Discharge Exercise Prescription (Final Exercise Prescription Changes): Exercise Prescription Changes - 11/25/18 1400      Response to Exercise   Blood Pressure (Admit)  124/70    Blood Pressure (Exercise)  120/82    Blood Pressure (Exit)  104/74    Heart Rate (Admit)  99 bpm    Heart Rate (Exercise)  103 bpm    Heart Rate (Exit)  90 bpm    Rating of Perceived Exertion (Exercise)  13    Symptoms  none    Duration  Continue with 45 min of aerobic exercise without signs/symptoms of physical distress.    Intensity  THRR unchanged      Progression   Progression  Continue to progress workloads to maintain intensity without signs/symptoms of physical distress.    Average METs  2.77      Resistance Training   Training Prescription  Yes    Weight  3 lbs    Reps  10-15      Interval Training   Interval Training  No      Treadmill   MPH  2    Grade  1    Minutes  15    METs  2.81      NuStep   Level  2    Minutes  15    METs  3.4      Arm Ergometer   Level  2    Minutes  15    METs  2.1      Home Exercise Plan   Plans to continue exercise at  Home (comment)   walking, treadmill, recumbent bike   Frequency  Add 2 additional days to program exercise  sessions.    Initial Home Exercises Provided  11/21/18       Functional Capacity:   Psychological, QOL, Others - Outcomes: PHQ 2/9: Depression screen [facility] 2/9 10/27/2018 09/19/2017 07/18/2017  Decreased Interest 0 0 0  Down, Depressed, Hopeless 1 1 0  PHQ - 2 Score 1 1 0  Altered sleeping 0 1 1  Tired, decreased energy 0 1 3  Change in appetite 0 1 2  Feeling bad or failure about yourself  0 0 0  Trouble concentrating 0 0 0  Moving slowly or fidgety/restless 0 1 0  Suicidal thoughts 0 0 0  PHQ-9 Score 1 5 6   Difficult doing work/chores Not difficult at all - Not difficult at all    Quality of Life:   Personal Goals: Goals established at orientation with interventions provided to work toward goal.    Personal Goals Discharge: Goals and Risk Factor Review    Row Name 12/16/18 1407 12/29/18 1033 01/19/19 1250  Core Components/Risk Factors/Patient Goals Review   Personal Goals Review  Diabetes;Hypertension;Heart Failure;Weight Management/Obesity;Lipids  Diabetes;Hypertension;Heart Failure;Weight Management/Obesity;Lipids  Diabetes;Hypertension;Heart Failure;Weight Management/Obesity;Lipids     Review  Tammy Hatfield is doing well at home.  Her weight is staying steady.  Her blood pressures continue to do well.  All her other numbers are staying steady as well.  She denies any heart failure symptoms at this time.  We will continue to check in.   Tammy Hatfield is not doing as well at home now.  Her weight is up some as she has been getting into comfort food more and not exercising.  She had a follow up last week with her endocinologist and her A1c was back up to 8 to they increased her insulin.  She would like to get things back under control again.  Her pressures have been good.  She is doing good health wise.    Tammy Hatfield has been able to keep her weight from going up too much.  She is back on a modified keto diet with healthier fat options.   She is hoping this will help her overall as well.       Expected Outcomes  Short: Continue to work on weight.  Long: Continue to manage her heart failure.   Short: Cut back on comfort food.  Long: Continue to work on risk factors.   Short: Continue to follow diet.  Long: Continue to work on weight loss.         Exercise Goals and Review:   Exercise Goals Re-Evaluation: Exercise Goals Re-Evaluation    Row Name 11/25/18 1429 12/16/18 1359 12/29/18 1025 01/19/19 1242       Exercise Goal Re-Evaluation   Exercise Goals Review  Increase Physical Activity;Increase Strength and Stamina;Understanding of Exercise Prescription  Increase Physical Activity;Increase Strength and Stamina;Understanding of Exercise Prescription  Increase Physical Activity;Increase Strength and Stamina;Understanding of Exercise Prescription  Increase Physical Activity;Increase Strength and Stamina;Understanding of Exercise Prescription    Comments  Tammy Hatfield continues to do well in rehab.  She is planning to go to her gym in complex with her exercise packet.  We will continue to monitor her progress at home.   Tammy Hatfield is doing well at home.  She is getting out to exercise daily.  Today, she has already gotten over 3,000 steps.  She walks around her neighborhood every day.  She is also staying active around the house as she clean each room.   Tammy Hatfield is not doing as well at home. She has lost her motivation to get out to exercise.  I encouraged her to watch the videos again to help get her motivated again.  I have also shared this with the mental health counselor to reach out to her.  Tammy Hatfield said she would try the videos again.Tammy Hatfield is doing better at home.  She is looking forward to the nice weather this week to be able to get out to go walk again.      Expected Outcomes  Short: Continue to exercise.  Long: Continue to increase strength and stamina.   Short: Continue wto walk daily.  Long: Continue to stay active.   Short: Try videos again and exercise more for boost.  Long: Continue to rebuild strength  and stamina.   Short: Continue to try to get out to go walk more.  Long: Continue to add activity back in.        Nutrition & Weight - Outcomes:    Nutrition: Nutrition Therapy & Goals -  12/03/18 1409      Nutrition Therapy   Diet  Diabetic Heart Healthy Low Sodium 1500-1700kcal    Protein (specify units)  75-85g    Fiber  25 grams    Whole Grain Foods  3 servings    Saturated Fats  12 max. grams    Fruits and Vegetables  5 servings/day    Sodium  1.5 grams      Personal Nutrition Goals   Nutrition Goal  ST: choosing healthy snacks (protein, CHO, fat) LT: Work on [employer]    Comments  Discussed modified diabetic MyPlate, discussed soluble fiber and the importance of CHO and plants. pt typically tires to eat healthy, eats oatmeal in the morning with skim milk (changing to almond milk) and eggs. discussed diet recommendations and how to lower triglycerides and cholesterol. Discussed Na recommendations and what to look for as well as how to break that up in a day.       Intervention Plan   Intervention  Nutrition handout(s) given to patient.    Expected Outcomes  Short Term Goal: Understand basic principles of dietary content, such as calories, fat, sodium, cholesterol and nutrients.;Short Term Goal: A plan has been developed with personal nutrition goals set during dietitian appointment.;Long Term Goal: Adherence to prescribed nutrition plan.       Nutrition Discharge:   Education Questionnaire Score:   Goals reviewed with patient; copy given to patient.

## 2019-06-26 ENCOUNTER — Other Ambulatory Visit: Payer: Self-pay | Admitting: Gastroenterology

## 2019-06-26 DIAGNOSIS — R1310 Dysphagia, unspecified: Secondary | ICD-10-CM

## 2019-06-30 ENCOUNTER — Ambulatory Visit
Admission: RE | Admit: 2019-06-30 | Discharge: 2019-06-30 | Disposition: A | Discharge status: Home / Self Care | Payer: Medicare HMO | Source: Ambulatory Visit | Attending: Gastroenterology | Admitting: Gastroenterology

## 2019-06-30 ENCOUNTER — Other Ambulatory Visit: Payer: Self-pay

## 2019-06-30 DIAGNOSIS — R1310 Dysphagia, unspecified: Secondary | ICD-10-CM | POA: Diagnosis present

## 2019-07-13 ENCOUNTER — Ambulatory Visit
Admission: RE | Admit: 2019-07-13 | Discharge: 2019-07-13 | Disposition: A | Discharge status: Home / Self Care | Payer: Medicare HMO | Source: Ambulatory Visit | Attending: Oncology | Admitting: Oncology

## 2019-07-13 DIAGNOSIS — Z1231 Encounter for screening mammogram for malignant neoplasm of breast: Secondary | ICD-10-CM | POA: Insufficient documentation

## 2019-07-13 DIAGNOSIS — Z853 Personal history of malignant neoplasm of breast: Secondary | ICD-10-CM | POA: Diagnosis not present

## 2019-07-13 DIAGNOSIS — C50211 Malignant neoplasm of upper-inner quadrant of right female breast: Secondary | ICD-10-CM

## 2019-09-07 ENCOUNTER — Other Ambulatory Visit: Payer: Medicare HMO

## 2019-09-09 ENCOUNTER — Encounter: Admission: RE | Payer: Self-pay | Source: Home / Self Care

## 2019-09-09 ENCOUNTER — Ambulatory Visit: Admission: RE | Admit: 2019-09-09 | Payer: Medicare HMO | Source: Home / Self Care | Admitting: Internal Medicine

## 2019-09-09 SURGERY — EGD (ESOPHAGOGASTRODUODENOSCOPY)
Anesthesia: General

## 2019-09-17 DIAGNOSIS — R0602 Shortness of breath: Secondary | ICD-10-CM | POA: Insufficient documentation

## 2019-10-13 ENCOUNTER — Other Ambulatory Visit: Admission: RE | Admit: 2019-10-13 | Payer: Medicare HMO | Source: Ambulatory Visit

## 2019-10-15 ENCOUNTER — Ambulatory Visit: Admit: 2019-10-15 | Payer: Medicare HMO | Admitting: Internal Medicine

## 2019-10-15 SURGERY — COLONOSCOPY WITH PROPOFOL
Anesthesia: General

## 2019-11-25 DIAGNOSIS — Z789 Other specified health status: Secondary | ICD-10-CM | POA: Insufficient documentation

## 2019-12-24 ENCOUNTER — Other Ambulatory Visit (HOSPITAL_COMMUNITY): Payer: Self-pay | Admitting: Neurology

## 2019-12-24 ENCOUNTER — Other Ambulatory Visit: Payer: Self-pay | Admitting: Neurology

## 2019-12-24 DIAGNOSIS — R4189 Other symptoms and signs involving cognitive functions and awareness: Secondary | ICD-10-CM

## 2019-12-31 ENCOUNTER — Other Ambulatory Visit: Payer: Self-pay | Admitting: Family Medicine

## 2019-12-31 DIAGNOSIS — M5030 Other cervical disc degeneration, unspecified cervical region: Secondary | ICD-10-CM

## 2019-12-31 DIAGNOSIS — M5412 Radiculopathy, cervical region: Secondary | ICD-10-CM

## 2020-01-05 ENCOUNTER — Other Ambulatory Visit: Payer: Self-pay | Admitting: Internal Medicine

## 2020-01-05 ENCOUNTER — Ambulatory Visit
Admission: RE | Admit: 2020-01-05 | Discharge: 2020-01-05 | Disposition: A | Discharge status: Home / Self Care | Payer: Medicare HMO | Source: Ambulatory Visit | Attending: Internal Medicine | Admitting: Internal Medicine

## 2020-01-05 ENCOUNTER — Other Ambulatory Visit: Payer: Self-pay

## 2020-01-05 DIAGNOSIS — R1032 Left lower quadrant pain: Secondary | ICD-10-CM

## 2020-01-05 LAB — POCT I-STAT CREATININE: Creatinine, Ser: 0.9 mg/dL (ref 0.44–1.00)

## 2020-01-05 MED ORDER — IOHEXOL 300 MG/ML  SOLN
100.0000 mL | Freq: Once | INTRAMUSCULAR | Status: AC | PRN
  Administered 2020-01-05: 16:00:00 100 mL via INTRAVENOUS

## 2020-01-09 ENCOUNTER — Ambulatory Visit (HOSPITAL_COMMUNITY)
Admission: RE | Admit: 2020-01-09 | Discharge: 2020-01-09 | Disposition: A | Discharge status: Home / Self Care | Payer: Medicare HMO | Source: Ambulatory Visit | Attending: Neurology | Admitting: Neurology

## 2020-01-09 ENCOUNTER — Other Ambulatory Visit: Payer: Self-pay

## 2020-01-09 DIAGNOSIS — R4189 Other symptoms and signs involving cognitive functions and awareness: Secondary | ICD-10-CM

## 2020-01-11 ENCOUNTER — Other Ambulatory Visit: Payer: Self-pay

## 2020-01-11 ENCOUNTER — Ambulatory Visit
Admission: RE | Admit: 2020-01-11 | Discharge: 2020-01-11 | Disposition: A | Discharge status: Home / Self Care | Payer: Medicare HMO | Source: Ambulatory Visit | Attending: Family Medicine | Admitting: Family Medicine

## 2020-01-11 DIAGNOSIS — M5412 Radiculopathy, cervical region: Secondary | ICD-10-CM | POA: Insufficient documentation

## 2020-01-11 DIAGNOSIS — M5030 Other cervical disc degeneration, unspecified cervical region: Secondary | ICD-10-CM | POA: Diagnosis present

## 2020-01-21 ENCOUNTER — Other Ambulatory Visit: Payer: Self-pay | Admitting: Orthopedic Surgery

## 2020-01-21 DIAGNOSIS — M25361 Other instability, right knee: Secondary | ICD-10-CM

## 2020-01-21 DIAGNOSIS — M25562 Pain in left knee: Secondary | ICD-10-CM

## 2020-01-21 DIAGNOSIS — M2392 Unspecified internal derangement of left knee: Secondary | ICD-10-CM

## 2020-01-21 DIAGNOSIS — M1712 Unilateral primary osteoarthritis, left knee: Secondary | ICD-10-CM

## 2020-01-24 NOTE — Progress Notes (Signed)
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  INTERVAL HISTORY: Patient returns to clinic today for routine yearly evaluation.  She has intermittent tenderness at the site of her surgical scar, but otherwise feels well and is asymptomatic.  She has no neurologic complaints. She denies any recent fevers or illnesses.  She has a good appetite and denies weight loss.  She denies any chest pain, shortness of breath, cough, or hemoptysis.  She denies any nausea, vomiting, constipation, or diarrhea.  She has no urinary complaints.  Patient offers no further specific complaints today.  REVIEW OF SYSTEMS:   Review of Systems  Constitutional: Negative.  Negative for fever, malaise/fatigue and weight loss.  Respiratory: Negative.  Negative for sputum production.   Cardiovascular: Negative.  Negative for chest pain and leg swelling.  Gastrointestinal: Negative.  Negative for abdominal pain.  Genitourinary: Negative.  Negative for dysuria.  Musculoskeletal: Negative.  Negative for back pain.  Skin: Negative.  Negative for rash.  Neurological: Negative.  Negative for sensory change, focal weakness and weakness.  Psychiatric/Behavioral: Negative.  The patient is not nervous/anxious.     As per HPI. Otherwise, a complete review of systems is negative.  PAST MEDICAL HISTORY: Past Medical History:  Diagnosis Date  . Anginal pain ([place])   . Anxiety   . Arthritis    knees  . Barrett's esophagus   . Breast cancer ([place]) 2014   RT LUMPECTOMY  . Cancer [facility]) 2014   right breast - s\p Radiation and lumpectomy and LN dissection  . Chronic  sinusitis   . Complication of anesthesia    hard to wake after Fentanyl  . COPD (chronic obstructive pulmonary disease) ([place]) 2012  . Coronary artery disease   . Degenerative lumbar disc   . Diabetes mellitus without complication ([place])   . Dysphagia   . Esophageal reflux   . Fatty liver   . History of Helicobacter pylori infection   . Hypercholesteremia   . Hyperlipidemia   . Hypertension   . Personal history of radiation therapy   . Pneumonia   . Radiation 2014   RT BREAST CA  . Sleep apnea    CPAP  . Ulcer, stomach peptic    history of  . Vertigo    last episode 2017(approx)    PAST SURGICAL HISTORY: Past Surgical History:  Procedure Laterality Date  . ABDOMINAL HYSTERECTOMY    . BREAST BIOPSY Right 1985   NEG  . BREAST EXCISIONAL BIOPSY Right 2014   [facility] and DCIS  . BREAST LUMPECTOMY Right 2014  . BUNIONECTOMY Bilateral 1980  . CATARACT EXTRACTION W/PHACO Left 04/01/2018   Procedure: CATARACT EXTRACTION PHACO AND INTRAOCULAR LENS PLACEMENT ([place]) LEFT DIABETES IVA TOPICAL;  Surgeon: [name], MD;  Location: [place];  Service: Ophthalmology;  Laterality: Left;  Diabetic - insulin and oral meds sleep apnea  . CATARACT EXTRACTION W/PHACO Right 04/30/2018   Procedure: CATARACT EXTRACTION PHACO AND INTRAOCULAR LENS PLACEMENT ([place]) RIGHT DIABETIC;  Surgeon: [name], MD;  Location: [place];  Service: Ophthalmology;  Laterality: Right;  Diabetic - insulin sleep apnea  . CHOLECYSTECTOMY    . CORONARY ANGIOPLASTY    . CORONARY STENT INTERVENTION  N/A 07/05/2017   Procedure: CORONARY STENT INTERVENTION;  Surgeon: [name], MD;  Location: [place] CV LAB;  Service: Cardiovascular;  Laterality: N/A;  . LAPAROSCOPIC TUBAL LIGATION Bilateral 1967  . LEFT HEART CATH AND CORONARY ANGIOGRAPHY N/A 07/05/2017   Procedure: LEFT HEART CATH AND CORONARY ANGIOGRAPHY;  Surgeon: [name], MD;  Location: [place] CV LAB;   Service: Cardiovascular;  Laterality: N/A;  . LEFT HEART CATH AND CORONARY ANGIOGRAPHY Left 09/18/2018   Procedure: Left Heart Cath w/ Coronary Angiography;  Surgeon: [name], MD;  Location: [place] CV [facility];  Service: Cardiovascular;  Laterality: Left;  . LEFT HEART CATH AND CORONARY ANGIOGRAPHY Left 02/25/2019   Procedure: LEFT HEART CATH AND CORONARY ANGIOGRAPHY;  Surgeon: Isaias Cowman, MD;  Location: [place] CV LAB;  Service: Cardiovascular;  Laterality: Left;  . SHOULDER ARTHROSCOPY WITH ROTATOR CUFF REPAIR Right 02/09/2015   Procedure: SHOULDER ARTHROSCOPY WITH ROTATOR CUFF REPAIR,release long head biceps tendon,subacromial decompression.;  Surgeon: Leanor Kail, MD;  Location: [facility];  Service: Orthopedics;  Laterality: Right;  . UMBILICAL HERNIA REPAIR N/A   . UVULOPALATOPHARYNGOPLASTY N/A 2001    FAMILY HISTORY Family History  Problem Relation Age of Onset  . Breast cancer Neg Hx        ADVANCED DIRECTIVES:    HEALTH MAINTENANCE: Social History   Tobacco Use  . Smoking status: Former Smoker    Packs/day: 2.00    Years: 30.00    Pack years: 60.00    Quit date: 02/01/2000    Years since quitting: 20.0  . Smokeless tobacco: Never Used  . Tobacco comment: 07/18/2017 former smoker quit 1999  Substance Use Topics  . Alcohol use: No    Alcohol/week: 0.0 standard drinks    Comment: may have drink on Holidays  . Drug use: No     Colonoscopy:  PAP:  Bone density:  Lipid panel:  Allergies  Allergen Reactions  . Amoxicillin Other (See Comments)    Yeast infection DID THE REACTION INVOLVE: Swelling of the face/tongue/throat, SOB, or low BP? No Sudden or severe rash/hives, skin peeling, or the inside of the mouth or nose? No Did it require medical treatment? No When did it last happen?within the past 10 years If all above answers are "NO", may proceed with cephalosporin use.  . Avelox [Moxifloxacin] Other (See Comments)    Muscle pain   . Benazepril Cough  . Bupropion     Dizziness, Headache  . Byetta 10 Mcg Pen [Exenatide] Diarrhea  . Isosorbide Swelling    Headaches, issues with balance  . Metformin Diarrhea  . Neurontin [Gabapentin] Other (See Comments)    Mouth blisters, joint pain, depression   . Pantoprazole     Other reaction(s): Abdominal Pain  . Sulfa Antibiotics Other (See Comments)    Headache  . Ceclor [Cefaclor] Rash    Current Outpatient Medications  Medication Sig Dispense Refill  . aspirin EC 81 MG tablet Take 81 mg by mouth daily.    . Cholecalciferol (VITAMIN D3) 25 MCG (1000 UT) CAPS Take 1,000 Units by mouth daily.    . cyanocobalamin 1000 MCG tablet Take by mouth.    . donepezil (ARICEPT) 5 MG tablet Take 5 mg by mouth at bedtime.    . fexofenadine (ALLEGRA) 180 MG tablet Take 180 mg by mouth daily.    . furosemide (LASIX) 20 MG tablet Take 20 mg by mouth daily as needed.    Marland Kitchen ibuprofen (ADVIL,MOTRIN) 200 MG tablet Take  400 mg by mouth daily as needed for headache or moderate pain.    Marland Kitchen insulin degludec (TRESIBA FLEXTOUCH) 200 UNIT/ML FlexTouch Pen Inject 45 Units into the skin daily.    . isosorbide mononitrate (IMDUR) 30 MG 24 hr tablet Take 1 tablet by mouth daily.    Marland Kitchen losartan (COZAAR) 50 MG tablet Take 50 mg by mouth every morning.     . metoprolol succinate (TOPROL-XL) 25 MG 24 hr tablet Take 75 mg by mouth daily.    . nitroGLYCERIN (NITROSTAT) 0.4 MG SL tablet Place 1 tablet (0.4 mg total) under the tongue every 5 (five) minutes as needed for chest pain. 30 tablet 0  . ONETOUCH VERIO test strip 1 each 3 (three) times daily.    . pantoprazole (PROTONIX) 20 MG tablet Take 20 mg by mouth daily.    Marland Kitchen PARoxetine (PAXIL) 40 MG tablet Take 40 mg by mouth 2 (two) times daily.     . Semaglutide,0.25 or 0.5MG/DOS, 2 MG/1.5ML SOPN Take 0.25 mg weekly for 4 weeks then adjust to 0.5 mg weekly.    . traMADol (ULTRAM) 50 MG tablet Take 50-100 mg by mouth 2 (two) times daily as needed for moderate  pain.     . valACYclovir (VALTREX) 500 MG tablet Take 500 mg by mouth 2 (two) times daily as needed (fever blisters).      No current facility-administered medications for this visit.    OBJECTIVE: Vitals:   01/28/20 1033  BP: 137/64  Pulse: 87  Temp: (!) 97.5 F (36.4 C)     Body mass index is 35.94 kg/m.    ECOG FS:0 - Asymptomatic  General: Well-developed, well-nourished, no acute distress. Eyes: Pink conjunctiva, anicteric sclera. HEENT: Normocephalic, moist mucous membranes. Breast: Bilateral breast and axilla without lumps or masses. Lungs: No audible wheezing or coughing. Heart: Regular rate and rhythm. Abdomen: Soft, nontender, no obvious distention. Musculoskeletal: No edema, cyanosis, or clubbing. Neuro: Alert, answering all questions appropriately. Cranial nerves grossly intact. Skin: No rashes or petechiae noted. Psych: Normal affect.   LAB RESULTS:  Lab Results  Component Value Date   NA 140 07/06/2017   K 4.2 07/06/2017   CL 105 07/06/2017   CO2 28 07/06/2017   GLUCOSE 151 (H) 07/06/2017   BUN 19 07/06/2017   CREATININE 0.90 01/05/2020   CALCIUM 8.8 (L) 07/06/2017   PROT 6.7 02/15/2015   ALBUMIN 3.4 (L) 02/15/2015   AST 47 (H) 02/15/2015   ALT 310 (H) 02/15/2015   ALKPHOS 109 02/15/2015   BILITOT 0.7 02/15/2015   GFRNONAA 55 (L) 07/06/2017   GFRAA >60 07/06/2017    Lab Results  Component Value Date   WBC 6.8 01/30/2018   NEUTROABS 4.1 01/30/2018   HGB 12.8 01/30/2018   HCT 37.4 01/30/2018   MCV 90.8 01/30/2018   PLT 191 01/30/2018     STUDIES: MR BRAIN WO CONTRAST  Result Date: 01/09/2020 CLINICAL DATA:  Cognitive impairment EXAM: MRI HEAD WITHOUT CONTRAST TECHNIQUE: Multiplanar, multiecho pulse sequences of the brain and surrounding structures were obtained without intravenous contrast. Additionally, using NeuroQuant software a 3D volumetric analysis of the brain was performed and is compared to a normative database adjusted for age,  gender and intracranial volume. COMPARISON:  MRI head 01/04/2017 FINDINGS: Brain: Ventricle size normal. Cerebral volume subjectively normal for age. Negative for acute infarct. No significant chronic ischemia. Negative for hemorrhage or mass lesion. Vascular: Normal arterial flow voids Skull and upper cervical spine: No focal skeletal lesion. Sinuses/Orbits: Paranasal sinuses  clear. Bilateral cataract extraction Other: None NeuroQuant Findings: Volumetric analysis of the brain was performed, with a fully detailed report in [facility]. Briefly, the comparison with age and gender matched reference reveals normal cerebral volume. IMPRESSION: 1. Normal for age MRI of the brain. 2. NeuroQuant volumetric analysis of the brain, see details on [employer]. Electronically Signed   By: Franchot Gallo M.D.   On: 01/09/2020 19:48   MR CERVICAL SPINE WO CONTRAST  Result Date: 01/11/2020 CLINICAL DATA:  Neck pain with bilateral hand tremors, finger numbness and muscle spasms. Cervical radiculitis. EXAM: MRI CERVICAL SPINE WITHOUT CONTRAST TECHNIQUE: Multiplanar, multisequence MR imaging of the cervical spine was performed. No intravenous contrast was administered. COMPARISON:  None. FINDINGS: Alignment: Minimal anterolisthesis at C6-7. Otherwise normal. Vertebrae: No acute or suspicious osseous findings. Cord: Normal in signal and caliber. Posterior Fossa, vertebral arteries, paraspinal tissues: Visualized portions of the posterior fossa and paraspinal soft tissues appear unremarkable. Bilateral vertebral artery flow voids. Disc levels: C2-3: Mild facet hypertrophy, worse on the right. No spinal stenosis or nerve root encroachment. C3-4: Asymmetric uncinate spurring and facet hypertrophy on the right, contributing to mild right foraminal narrowing. No cord deformity. C4-5: Mild disc bulging asymmetric to the right and bilateral facet hypertrophy. Mild foraminal narrowing bilaterally. No cord deformity. C5-6: Spondylosis with  loss of disc height and a broad-based right foraminal disc osteophyte complex. The CSF surrounding the cord is effaced with narrowing of the AP diameter of the canal to 8 mm. There is moderate to severe right foraminal narrowing and probable right C6 nerve root encroachment. The left foramen is mildly narrowed. Mild bilateral facet hypertrophy. C6-7: Mild disc bulging and moderate bilateral facet hypertrophy. Mild left foraminal narrowing without definite nerve root encroachment. C7-T1: No significant findings. IMPRESSION: 1. Spondylosis at C5-6 with a broad-based right foraminal disc osteophyte complex, contributing to mild spinal stenosis and moderate to severe right foraminal narrowing. There is probable right C6 nerve root encroachment. 2. Mild foraminal narrowing at the other levels as described. 3. No cord deformity or abnormal cord signal. Electronically Signed   By: Richardean Sale M.D.   On: 01/11/2020 11:33   CT ABDOMEN PELVIS W CONTRAST  Result Date: 01/05/2020 CLINICAL DATA:  Lower abdominal pain and diarrhea EXAM: CT ABDOMEN AND PELVIS WITH CONTRAST TECHNIQUE: Multidetector CT imaging of the abdomen and pelvis was performed using the standard protocol following bolus administration of intravenous contrast. CONTRAST:  124m OMNIPAQUE IOHEXOL 300 MG/ML  SOLN COMPARISON:  05/02/2016 FINDINGS: Lower chest: No acute abnormality. Hepatobiliary: Fatty infiltration of the liver is noted. The gallbladder has been surgically removed. Pancreas: Unremarkable. No pancreatic ductal dilatation or surrounding inflammatory changes. Spleen: Normal in size without focal abnormality. Adrenals/Urinary Tract: Adrenal glands are within normal limits. Kidneys demonstrate a normal enhancement pattern bilaterally. No renal calculi or urinary tract obstructive changes are seen. Normal excretion of contrast is noted on delayed images. The bladder is decompressed. Stomach/Bowel: Diverticular change of the colon is noted with  evidence of mild diverticulitis in the sigmoid colon. No perforation or abscess formation is identified. The more proximal colon is decompressed and within normal limits. The appendix is unremarkable. No small bowel abnormality is seen. The stomach shows a small sliding-type hiatal hernia. Vascular/Lymphatic: Diffuse atherosclerotic calcifications are noted in the abdominal aorta. Areas of prior mild dissection are again noted and stable. No sizable adenopathy is noted. Reproductive: Status post hysterectomy. No adnexal masses. Other: No abdominal wall hernia or abnormality. No abdominopelvic ascites. Musculoskeletal: Degenerative  changes of lumbar spine are noted. IMPRESSION: Changes of diverticulitis within the sigmoid colon. Chronic changes as described above stable from the previous exam. Electronically Signed   By: Inez Catalina M.D.   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  PLAN:    1. 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  She completed XRT in approximately January 2015. Patient could not tolerate letrozole, anastrozole, or Aromasin. She elected to discontinue all aromatase inhibitors despite knowing her risk of recurrence increases.  Her most recent mammogram on July 13, 2019 was reported as BI-RADS 1.  Repeat in November 2021.  Patient wishes to continue routine follow-up, therefore she will return to clinic in 1 year for further evaluation.   I spent a total of 20 minutes reviewing chart data, face-to-face evaluation with the patient, counseling and coordination of care as detailed above.   Patient expressed understanding and was in agreement with this plan. She also  understands that She can call clinic at any time with any questions, concerns, or complaints.    Lloyd Huger, MD   01/29/2020 6:49 AM

## 2020-01-28 ENCOUNTER — Inpatient Hospital Stay: Payer: Medicare HMO | Attending: Oncology | Admitting: Oncology

## 2020-01-28 ENCOUNTER — Other Ambulatory Visit: Payer: Self-pay

## 2020-01-28 ENCOUNTER — Encounter: Payer: Self-pay | Admitting: Oncology

## 2020-01-28 VITALS — BP 137/64 | HR 87 | Temp 97.5°F | Wt 216.0 lb

## 2020-01-28 DIAGNOSIS — E7800 Pure hypercholesterolemia, unspecified: Secondary | ICD-10-CM | POA: Insufficient documentation

## 2020-01-28 DIAGNOSIS — Z794 Long term (current) use of insulin: Secondary | ICD-10-CM | POA: Diagnosis not present

## 2020-01-28 DIAGNOSIS — Z79899 Other long term (current) drug therapy: Secondary | ICD-10-CM | POA: Insufficient documentation

## 2020-01-28 DIAGNOSIS — C50919 Malignant neoplasm of unspecified site of unspecified female breast: Secondary | ICD-10-CM | POA: Insufficient documentation

## 2020-01-28 DIAGNOSIS — I10 Essential (primary) hypertension: Secondary | ICD-10-CM | POA: Diagnosis not present

## 2020-01-28 DIAGNOSIS — Z87891 Personal history of nicotine dependence: Secondary | ICD-10-CM | POA: Diagnosis not present

## 2020-01-28 DIAGNOSIS — I2510 Atherosclerotic heart disease of native coronary artery without angina pectoris: Secondary | ICD-10-CM | POA: Insufficient documentation

## 2020-01-28 DIAGNOSIS — C50211 Malignant neoplasm of upper-inner quadrant of right female breast: Secondary | ICD-10-CM

## 2020-01-28 DIAGNOSIS — E119 Type 2 diabetes mellitus without complications: Secondary | ICD-10-CM | POA: Insufficient documentation

## 2020-01-28 DIAGNOSIS — Z170 Estrogen receptor positive status [ER+]: Secondary | ICD-10-CM | POA: Insufficient documentation

## 2020-01-28 DIAGNOSIS — M519 Unspecified thoracic, thoracolumbar and lumbosacral intervertebral disc disorder: Secondary | ICD-10-CM | POA: Insufficient documentation

## 2020-02-01 ENCOUNTER — Other Ambulatory Visit: Payer: Self-pay | Admitting: General Surgery

## 2020-02-02 ENCOUNTER — Other Ambulatory Visit: Payer: Self-pay

## 2020-02-02 ENCOUNTER — Ambulatory Visit
Admission: RE | Admit: 2020-02-02 | Discharge: 2020-02-02 | Disposition: A | Discharge status: Home / Self Care | Payer: Medicare HMO | Source: Ambulatory Visit | Attending: Orthopedic Surgery | Admitting: Orthopedic Surgery

## 2020-02-02 DIAGNOSIS — G8929 Other chronic pain: Secondary | ICD-10-CM

## 2020-02-02 DIAGNOSIS — M1712 Unilateral primary osteoarthritis, left knee: Secondary | ICD-10-CM | POA: Diagnosis present

## 2020-02-02 DIAGNOSIS — M25361 Other instability, right knee: Secondary | ICD-10-CM | POA: Diagnosis present

## 2020-02-02 DIAGNOSIS — M2392 Unspecified internal derangement of left knee: Secondary | ICD-10-CM

## 2020-02-02 DIAGNOSIS — M25562 Pain in left knee: Secondary | ICD-10-CM | POA: Insufficient documentation

## 2020-02-09 ENCOUNTER — Ambulatory Visit: Payer: Medicare HMO | Admitting: Oncology

## 2020-05-13 ENCOUNTER — Other Ambulatory Visit
Admission: RE | Admit: 2020-05-13 | Discharge: 2020-05-13 | Disposition: A | Discharge status: Home / Self Care | Payer: Medicare HMO | Source: Ambulatory Visit | Attending: Internal Medicine | Admitting: Internal Medicine

## 2020-05-13 ENCOUNTER — Other Ambulatory Visit: Payer: Self-pay

## 2020-05-13 DIAGNOSIS — Z20822 Contact with and (suspected) exposure to covid-19: Secondary | ICD-10-CM | POA: Insufficient documentation

## 2020-05-13 DIAGNOSIS — Z01818 Encounter for other preprocedural examination: Secondary | ICD-10-CM | POA: Diagnosis present

## 2020-05-13 LAB — SARS CORONAVIRUS 2 (TAT 6-24 HRS): SARS Coronavirus 2: NEGATIVE

## 2020-05-18 ENCOUNTER — Encounter: Admission: RE | Payer: Self-pay | Source: Home / Self Care

## 2020-05-18 ENCOUNTER — Ambulatory Visit: Admission: RE | Admit: 2020-05-18 | Payer: Medicare HMO | Source: Home / Self Care | Admitting: Internal Medicine

## 2020-05-18 SURGERY — COLONOSCOPY WITH PROPOFOL
Anesthesia: General

## 2020-06-09 ENCOUNTER — Ambulatory Visit (INDEPENDENT_AMBULATORY_CARE_PROVIDER_SITE_OTHER): Payer: Medicare HMO | Admitting: Nurse Practitioner

## 2020-06-09 ENCOUNTER — Encounter (INDEPENDENT_AMBULATORY_CARE_PROVIDER_SITE_OTHER): Payer: Self-pay | Admitting: Nurse Practitioner

## 2020-06-09 ENCOUNTER — Other Ambulatory Visit: Payer: Self-pay

## 2020-06-09 VITALS — BP 121/75 | HR 83 | Ht 64.0 in | Wt 207.0 lb

## 2020-06-09 DIAGNOSIS — I8311 Varicose veins of right lower extremity with inflammation: Secondary | ICD-10-CM | POA: Diagnosis not present

## 2020-06-09 DIAGNOSIS — I8312 Varicose veins of left lower extremity with inflammation: Secondary | ICD-10-CM

## 2020-06-09 DIAGNOSIS — I10 Essential (primary) hypertension: Secondary | ICD-10-CM

## 2020-06-09 NOTE — Progress Notes (Signed)
Subjective:    Patient ID: Tammy Hatfield, female    DOB: 12/20/1945, 74 y.o.   MRN: [id number] Chief Complaint  Patient presents with  . New Patient (Initial Visit)    Varicose veins    The patient is seen for evaluation of symptomatic varicose veins.  Patient was previously treated for her varicose veins in 2016 with an endovenous ablative to the left lower extremity.  Initially the patient was scheduled to have both legs treated but due to her husband's worsening dementia she was unable to proceed with further treatment at the time.  However, the patient noticed recently that she began to have bleeding of her varicosities in the right lower extremity after a very small traumatic event.  She also notices worsening large varicosities in the left lower extremity.  The patient relates burning and stinging which worsened steadily throughout the course of the day, particularly with standing. The patient also notes an aching and throbbing pain over the varicosities, particularly with prolonged dependent positions. The symptoms are significantly improved with elevation.  The patient also notes that during hot weather the symptoms are greatly intensified. The patient states the pain from the varicose veins interferes with work, daily exercise, shopping and household maintenance. At this point, the symptoms are persistent and severe enough that they're having a negative impact on lifestyle and are interfering with daily activities.  There is no history of DVT, PE or superficial thrombophlebitis. There is history of ulceration or hemorrhage. The patient denies a significant family history of varicose veins.    The patient has worn graduated compression in the past. At the present time the patient has been using over-the-counter analgesics. There is a previous history of endovenous laser ablation of her left lower extremity sometime in 2016.    Review of Systems  Neurological: Positive for  numbness.  Hematological: Bruises/bleeds easily.  All other systems reviewed and are negative.      Objective:   Physical Exam Vitals reviewed.  HENT:     Head: Normocephalic.  Cardiovascular:     Rate and Rhythm: Normal rate and regular rhythm.     Pulses: Normal pulses.          Dorsalis pedis pulses are 2+ on the right side and 2+ on the left side.       Posterior tibial pulses are 2+ on the right side and 2+ on the left side.     Comments: Scattered spider varicosities bilaterally Pulmonary:     Effort: Pulmonary effort is normal.  Musculoskeletal:     Right lower leg: No edema.     Left lower leg: No edema.  Skin:    Capillary Refill: Capillary refill takes more than 3 seconds.  Neurological:     Mental Status: She is alert and oriented to person, place, and time.  Psychiatric:        Mood and Affect: Mood normal.        Behavior: Behavior normal.        Thought Content: Thought content normal.        Judgment: Judgment normal.     BP 121/75   Pulse 83   Ht 5\' 4"  (1.626 m)   Wt 207 lb (93.9 kg)   BMI 35.53 kg/m   Past Medical History:  Diagnosis Date  . Anginal pain ([place])   . Anxiety   . Arthritis    knees  . Barrett's esophagus   . Breast cancer ([place]) 2014  RT LUMPECTOMY  . Cancer [facility]) 2014   right breast - s\p Radiation and lumpectomy and LN dissection  . Chronic sinusitis   . Complication of anesthesia    hard to wake after Fentanyl  . COPD (chronic obstructive pulmonary disease) ([place]) 2012  . Coronary artery disease   . Degenerative lumbar disc   . Diabetes mellitus without complication ([place])   . Dysphagia   . Esophageal reflux   . Fatty liver   . History of Helicobacter pylori infection   . Hypercholesteremia   . Hyperlipidemia   . Hypertension   . Personal history of radiation therapy   . Pneumonia   . Radiation 2014   RT BREAST CA  . Sleep apnea    CPAP  . Ulcer, stomach peptic    history of  . Vertigo    last episode  2017(approx)    Social History   Socioeconomic History  . Marital status: Widowed    Spouse name: Not on file  . Number of children: Not on file  . Years of education: Not on file  . Highest education level: Not on file  Occupational History  . Not on file  Tobacco Use  . Smoking status: Former Smoker    Packs/day: 2.00    Years: 30.00    Pack years: 60.00    Quit date: 02/01/2000    Years since quitting: 20.3  . Smokeless tobacco: Never Used  . Tobacco comment: 07/18/2017 former smoker quit 1999  Vaping Use  . Vaping Use: Never used  Substance and Sexual Activity  . Alcohol use: No    Alcohol/week: 0.0 standard drinks    Comment: may have drink on Holidays  . Drug use: No  . Sexual activity: Yes    Birth control/protection: Post-menopausal  Other Topics Concern  . Not on file  Social History Narrative  . Not on file   Social Determinants of Health   Financial Resource Strain:   . Difficulty of Paying Living Expenses: Not on file  Food Insecurity:   . Worried About Charity fundraiser in the Last Year: Not on file  . Ran Out of Food in the Last Year: Not on file  Transportation Needs:   . Lack of Transportation (Medical): Not on file  . Lack of Transportation (Non-Medical): Not on file  Physical Activity:   . Days of Exercise per Week: Not on file  . Minutes of Exercise per Session: Not on file  Stress:   . Feeling of Stress : Not on file  Social Connections:   . Frequency of Communication with Friends and Family: Not on file  . Frequency of Social Gatherings with Friends and Family: Not on file  . Attends Religious Services: Not on file  . Active Member of Clubs or Organizations: Not on file  . Attends Archivist Meetings: Not on file  . Marital Status: Not on file  Intimate Partner Violence:   . Fear of Current or Ex-Partner: Not on file  . Emotionally Abused: Not on file  . Physically Abused: Not on file  . Sexually Abused: Not on file     Past Surgical History:  Procedure Laterality Date  . ABDOMINAL HYSTERECTOMY    . BREAST BIOPSY Right 1985   NEG  . BREAST EXCISIONAL BIOPSY Right 2014   [facility] and DCIS  . BREAST LUMPECTOMY Right 2014  . BUNIONECTOMY Bilateral 1980  . CATARACT EXTRACTION W/PHACO Left 04/01/2018   Procedure: CATARACT EXTRACTION PHACO  AND INTRAOCULAR LENS PLACEMENT ([place]) LEFT DIABETES IVA TOPICAL;  Surgeon: [name], MD;  Location: [place];  Service: Ophthalmology;  Laterality: Left;  Diabetic - insulin and oral meds sleep apnea  . CATARACT EXTRACTION W/PHACO Right 04/30/2018   Procedure: CATARACT EXTRACTION PHACO AND INTRAOCULAR LENS PLACEMENT ([place]) RIGHT DIABETIC;  Surgeon: [name], MD;  Location: [place];  Service: Ophthalmology;  Laterality: Right;  Diabetic - insulin sleep apnea  . CHOLECYSTECTOMY    . CORONARY ANGIOPLASTY    . CORONARY STENT INTERVENTION N/A 07/05/2017   Procedure: CORONARY STENT INTERVENTION;  Surgeon: [name], MD;  Location: [place] [facility];  Service: Cardiovascular;  Laterality: N/A;  . LAPAROSCOPIC TUBAL LIGATION Bilateral 1967  . LEFT HEART CATH AND CORONARY ANGIOGRAPHY N/A 07/05/2017   Procedure: LEFT HEART CATH AND CORONARY ANGIOGRAPHY;  Surgeon: [name], MD;  Location: [place] [facility];  Service: Cardiovascular;  Laterality: N/A;  . LEFT HEART CATH AND CORONARY ANGIOGRAPHY Left 09/18/2018   Procedure: Left Heart Cath w/ Coronary Angiography;  Surgeon: [name], MD;  Location: [place] [facility];  Service: Cardiovascular;  Laterality: Left;  . LEFT HEART CATH AND CORONARY ANGIOGRAPHY Left 02/25/2019   Procedure: LEFT HEART CATH AND CORONARY ANGIOGRAPHY;  Surgeon: Isaias Cowman, MD;  Location: [place] [facility];  Service: Cardiovascular;  Laterality: Left;  . SHOULDER ARTHROSCOPY WITH ROTATOR CUFF REPAIR Right 02/09/2015   Procedure: SHOULDER ARTHROSCOPY WITH ROTATOR CUFF REPAIR,release  long head biceps tendon,subacromial decompression.;  Surgeon: Leanor Kail, MD;  Location: [facility];  Service: Orthopedics;  Laterality: Right;  . UMBILICAL HERNIA REPAIR N/A   . UVULOPALATOPHARYNGOPLASTY N/A 2001    Family History  Problem Relation Age of Onset  . Breast cancer Neg Hx     Allergies  Allergen Reactions  . Donepezil Other (See Comments)    Patient has severe nightmares   . Amoxicillin Other (See Comments)    Yeast infection DID THE REACTION INVOLVE: Swelling of the face/tongue/throat, SOB, or low BP? No Sudden or severe rash/hives, skin peeling, or the inside of the mouth or nose? No Did it require medical treatment? No When did it last happen?within the past 10 years If all above answers are "NO", may proceed with cephalosporin use.  . Avelox [Moxifloxacin] Other (See Comments)    Muscle pain  . Benazepril Cough  . Bupropion     Dizziness, Headache  . Byetta 10 Mcg Pen [Exenatide] Diarrhea  . Isosorbide Swelling    Headaches, issues with balance  . Metformin Diarrhea  . Neurontin [Gabapentin] Other (See Comments)    Mouth blisters, joint pain, depression   . Pantoprazole     Other reaction(s): Abdominal Pain  . Sulfa Antibiotics Other (See Comments)    Headache  . Ceclor [Cefaclor] Rash       Assessment & Plan:   1. Varicose veins of both lower extremities with inflammation  Recommend:  The patient has large symptomatic varicose veins that are painful and associated with swelling and episodes of bleeding.  I have had a long discussion with the patient regarding  varicose veins and why they cause symptoms.  Patient will continue wearing graduated compression stockings class 1 on a daily basis, beginning first thing in the morning and removing them in the evening. The patient is instructed specifically not to sleep in the stockings.    The patient  will also begin using over-the-counter analgesics such as Motrin 600 mg po TID to help  control  the symptoms.    In addition, behavioral modification including elevation during the day will be initiated.    We will have the patient return at her convenience for noninvasive studies in order to reevaluate the patient's venous reflux and determine what appropriate intervention is needed at this time.  2. Essential hypertension Continue antihypertensive medications as already ordered, these medications have been reviewed and there are no changes at this time.    Current Outpatient Medications on File Prior to Visit  Medication Sig Dispense Refill  . aspirin EC 81 MG tablet Take 81 mg by mouth daily.    . isosorbide mononitrate (IMDUR) 30 MG 24 hr tablet Take 1 tablet by mouth daily.    Marland Kitchen lamoTRIgine (LAMICTAL) 25 MG tablet Take 25 mg in the morning and 50 mg at night for 2 weeks then increase to 50 mg two times a day    . losartan (COZAAR) 50 MG tablet Take 50 mg by mouth every morning.     . metoprolol succinate (TOPROL-XL) 25 MG 24 hr tablet Take 75 mg by mouth daily.    . nitroGLYCERIN (NITROSTAT) 0.4 MG SL tablet Place 1 tablet (0.4 mg total) under the tongue every 5 (five) minutes as needed for chest pain. 30 tablet 0  . ONETOUCH VERIO test strip 1 each 3 (three) times daily.    . pantoprazole (PROTONIX) 20 MG tablet Take 20 mg by mouth daily.    Marland Kitchen PARoxetine (PAXIL) 40 MG tablet Take 40 mg by mouth 2 (two) times daily.     . Semaglutide,0.25 or 0.5MG /DOS, 2 MG/1.5ML SOPN Take 0.25 mg weekly for 4 weeks then adjust to 0.5 mg weekly.    . traMADol (ULTRAM) 50 MG tablet Take 50-100 mg by mouth 2 (two) times daily as needed for moderate pain.     . valACYclovir (VALTREX) 500 MG tablet Take 500 mg by mouth 2 (two) times daily as needed (fever blisters).     . Cholecalciferol (VITAMIN D3) 25 MCG (1000 UT) CAPS Take 1,000 Units by mouth daily. (Patient not taking: Reported on 06/09/2020)    . cyanocobalamin 1000 MCG tablet Take by mouth. (Patient not taking: Reported on 06/09/2020)    .  donepezil (ARICEPT) 5 MG tablet Take 5 mg by mouth at bedtime. (Patient not taking: Reported on 06/09/2020)    . fexofenadine (ALLEGRA) 180 MG tablet Take 180 mg by mouth daily. (Patient not taking: Reported on 06/09/2020)    . furosemide (LASIX) 20 MG tablet Take 20 mg by mouth daily as needed. (Patient not taking: Reported on 06/09/2020)    . ibuprofen (ADVIL,MOTRIN) 200 MG tablet Take 400 mg by mouth daily as needed for headache or moderate pain. (Patient not taking: Reported on 06/09/2020)    . insulin degludec (TRESIBA FLEXTOUCH) 200 UNIT/ML FlexTouch Pen Inject 45 Units into the skin daily. (Patient not taking: Reported on 06/09/2020)     No current facility-administered medications on file prior to visit.    There are no Patient Instructions on file for this visit. No follow-ups on file.   Kris Hartmann, NP

## 2020-06-23 ENCOUNTER — Other Ambulatory Visit: Payer: Self-pay | Admitting: Orthopedic Surgery

## 2020-06-23 ENCOUNTER — Other Ambulatory Visit (HOSPITAL_COMMUNITY): Payer: Self-pay | Admitting: Orthopedic Surgery

## 2020-06-23 DIAGNOSIS — G8929 Other chronic pain: Secondary | ICD-10-CM

## 2020-06-27 ENCOUNTER — Other Ambulatory Visit (INDEPENDENT_AMBULATORY_CARE_PROVIDER_SITE_OTHER): Payer: Self-pay | Admitting: Nurse Practitioner

## 2020-06-27 DIAGNOSIS — I8312 Varicose veins of left lower extremity with inflammation: Secondary | ICD-10-CM

## 2020-06-27 DIAGNOSIS — I8311 Varicose veins of right lower extremity with inflammation: Secondary | ICD-10-CM

## 2020-06-27 DIAGNOSIS — Z9889 Other specified postprocedural states: Secondary | ICD-10-CM

## 2020-06-29 ENCOUNTER — Other Ambulatory Visit: Payer: Self-pay

## 2020-06-29 ENCOUNTER — Ambulatory Visit (INDEPENDENT_AMBULATORY_CARE_PROVIDER_SITE_OTHER): Payer: Medicare HMO

## 2020-06-29 ENCOUNTER — Ambulatory Visit (INDEPENDENT_AMBULATORY_CARE_PROVIDER_SITE_OTHER): Payer: Medicare HMO | Admitting: Nurse Practitioner

## 2020-06-29 VITALS — BP 126/85 | HR 77 | Ht 64.0 in | Wt 210.0 lb

## 2020-06-29 DIAGNOSIS — I8311 Varicose veins of right lower extremity with inflammation: Secondary | ICD-10-CM | POA: Diagnosis not present

## 2020-06-29 DIAGNOSIS — E7800 Pure hypercholesterolemia, unspecified: Secondary | ICD-10-CM | POA: Diagnosis not present

## 2020-06-29 DIAGNOSIS — I10 Essential (primary) hypertension: Secondary | ICD-10-CM | POA: Diagnosis not present

## 2020-06-29 DIAGNOSIS — Z9889 Other specified postprocedural states: Secondary | ICD-10-CM | POA: Diagnosis not present

## 2020-06-29 DIAGNOSIS — I8312 Varicose veins of left lower extremity with inflammation: Secondary | ICD-10-CM

## 2020-07-04 ENCOUNTER — Encounter (INDEPENDENT_AMBULATORY_CARE_PROVIDER_SITE_OTHER): Payer: Self-pay | Admitting: Nurse Practitioner

## 2020-07-04 ENCOUNTER — Other Ambulatory Visit: Payer: Self-pay | Admitting: Surgery

## 2020-07-04 NOTE — Progress Notes (Signed)
Subjective:    Patient ID: Tammy Hatfield, female    DOB: 17-May-1946, 74 y.o.   MRN: [id number] Chief Complaint  Patient presents with  . Follow-up    pt con. bil ven reflux     The patient returns for followup evaluation after the initial visit. The patient continues to have pain in the lower extremities with dependency. The pain is lessened with elevation. Graduated compression stockings, Class I (20-30 mmHg), have been worn but the stockings do not eliminate the leg pain. Over-the-counter analgesics do not improve the symptoms. The degree of discomfort continues to interfere with daily activities. The patient notes the pain in the legs is causing problems with daily exercise, at the workplace and even with household activities and maintenance such as standing in the kitchen preparing meals and doing dishes.   The patient has worn medical grade 1 compression stockings since 2016.  The patient previously had her left great saphenous vein ablated in 2016 however due to family issues she was unable to have the right great saphenous vein treated.  Venous ultrasound shows normal deep venous system, no evidence of acute or chronic DVT.  Superficial reflux is present in the great saphenous vein at the proximal thigh extending to the knee and the right lower extremity.  There is also reflux in the short saphenous vein of the right lower extremity.   Review of Systems  Cardiovascular: Positive for leg swelling.  All other systems reviewed and are negative.      Objective:   Physical Exam Vitals reviewed.  HENT:     Head: Normocephalic.  Cardiovascular:     Rate and Rhythm: Normal rate and regular rhythm.     Pulses: Normal pulses.     Comments: Large varicosities right lower extremity Pulmonary:     Effort: Pulmonary effort is normal.  Musculoskeletal:        General: Tenderness present.     Right lower leg: Edema present.  Neurological:     Mental Status: She is alert.    Psychiatric:        Mood and Affect: Mood normal.        Behavior: Behavior normal.        Thought Content: Thought content normal.        Judgment: Judgment normal.     BP 126/85   Pulse 77   Ht 5\' 4"  (1.626 m)   Wt 210 lb (95.3 kg)   BMI 36.05 kg/m   Past Medical History:  Diagnosis Date  . Anginal pain ([place])   . Anxiety   . Arthritis    knees  . Barrett's esophagus   . Breast cancer ([place]) 2014   RT LUMPECTOMY  . Cancer [facility]) 2014   right breast - s\p Radiation and lumpectomy and LN dissection  . Chronic sinusitis   . Complication of anesthesia    hard to wake after Fentanyl  . COPD (chronic obstructive pulmonary disease) ([place]) 2012  . Coronary artery disease   . Degenerative lumbar disc   . Diabetes mellitus without complication ([place])   . Dysphagia   . Esophageal reflux   . Fatty liver   . History of Helicobacter pylori infection   . Hypercholesteremia   . Hyperlipidemia   . Hypertension   . Personal history of radiation therapy   . Pneumonia   . Radiation 2014   RT BREAST CA  . Sleep apnea    CPAP  . Ulcer, stomach peptic  history of  . Vertigo    last episode 2017(approx)    Social History   Socioeconomic History  . Marital status: Widowed    Spouse name: Not on file  . Number of children: Not on file  . Years of education: Not on file  . Highest education level: Not on file  Occupational History  . Not on file  Tobacco Use  . Smoking status: Former Smoker    Packs/day: 2.00    Years: 30.00    Pack years: 60.00    Quit date: 02/01/2000    Years since quitting: 20.4  . Smokeless tobacco: Never Used  . Tobacco comment: 07/18/2017 former smoker quit 1999  Vaping Use  . Vaping Use: Never used  Substance and Sexual Activity  . Alcohol use: No    Alcohol/week: 0.0 standard drinks    Comment: may have drink on Holidays  . Drug use: No  . Sexual activity: Yes    Birth control/protection: Post-menopausal  Other Topics Concern  . Not on  file  Social History Narrative  . Not on file   Social Determinants of Health   Financial Resource Strain:   . Difficulty of Paying Living Expenses: Not on file  Food Insecurity:   . Worried About Charity fundraiser in the Last Year: Not on file  . Ran Out of Food in the Last Year: Not on file  Transportation Needs:   . Lack of Transportation (Medical): Not on file  . Lack of Transportation (Non-Medical): Not on file  Physical Activity:   . Days of Exercise per Week: Not on file  . Minutes of Exercise per Session: Not on file  Stress:   . Feeling of Stress : Not on file  Social Connections:   . Frequency of Communication with Friends and Family: Not on file  . Frequency of Social Gatherings with Friends and Family: Not on file  . Attends Religious Services: Not on file  . Active Member of Clubs or Organizations: Not on file  . Attends Archivist Meetings: Not on file  . Marital Status: Not on file  Intimate Partner Violence:   . Fear of Current or Ex-Partner: Not on file  . Emotionally Abused: Not on file  . Physically Abused: Not on file  . Sexually Abused: Not on file    Past Surgical History:  Procedure Laterality Date  . ABDOMINAL HYSTERECTOMY    . BREAST BIOPSY Right 1985   NEG  . BREAST EXCISIONAL BIOPSY Right 2014   [facility] and DCIS  . BREAST LUMPECTOMY Right 2014  . BUNIONECTOMY Bilateral 1980  . CATARACT EXTRACTION W/PHACO Left 04/01/2018   Procedure: CATARACT EXTRACTION PHACO AND INTRAOCULAR LENS PLACEMENT ([place]) LEFT DIABETES IVA TOPICAL;  Surgeon: [name], MD;  Location: [place];  Service: Ophthalmology;  Laterality: Left;  Diabetic - insulin and oral meds sleep apnea  . CATARACT EXTRACTION W/PHACO Right 04/30/2018   Procedure: CATARACT EXTRACTION PHACO AND INTRAOCULAR LENS PLACEMENT ([place]) RIGHT DIABETIC;  Surgeon: [name], MD;  Location: [place];  Service: Ophthalmology;  Laterality: Right;  Diabetic -  insulin sleep apnea  . CHOLECYSTECTOMY    . CORONARY ANGIOPLASTY    . CORONARY STENT INTERVENTION N/A 07/05/2017   Procedure: CORONARY STENT INTERVENTION;  Surgeon: [name], MD;  Location: [place] [facility];  Service: Cardiovascular;  Laterality: N/A;  . LAPAROSCOPIC TUBAL LIGATION Bilateral 1967  . LEFT HEART CATH AND CORONARY ANGIOGRAPHY N/A 07/05/2017  Procedure: LEFT HEART CATH AND CORONARY ANGIOGRAPHY;  Surgeon: [name], MD;  Location: [place] [facility];  Service: Cardiovascular;  Laterality: N/A;  . LEFT HEART CATH AND CORONARY ANGIOGRAPHY Left 09/18/2018   Procedure: Left Heart Cath w/ Coronary Angiography;  Surgeon: [name], MD;  Location: [place] [facility];  Service: Cardiovascular;  Laterality: Left;  . LEFT HEART CATH AND CORONARY ANGIOGRAPHY Left 02/25/2019   Procedure: LEFT HEART CATH AND CORONARY ANGIOGRAPHY;  Surgeon: Isaias Cowman, MD;  Location: [place] [facility];  Service: Cardiovascular;  Laterality: Left;  . SHOULDER ARTHROSCOPY WITH ROTATOR CUFF REPAIR Right 02/09/2015   Procedure: SHOULDER ARTHROSCOPY WITH ROTATOR CUFF REPAIR,release long head biceps tendon,subacromial decompression.;  Surgeon: Leanor Kail, MD;  Location: [facility];  Service: Orthopedics;  Laterality: Right;  . UMBILICAL HERNIA REPAIR N/A   . UVULOPALATOPHARYNGOPLASTY N/A 2001    Family History  Problem Relation Age of Onset  . Breast cancer Neg Hx     Allergies  Allergen Reactions  . Donepezil Other (See Comments)    Patient has severe nightmares   . Amoxicillin Other (See Comments)    Yeast infection DID THE REACTION INVOLVE: Swelling of the face/tongue/throat, SOB, or low BP? No Sudden or severe rash/hives, skin peeling, or the inside of the mouth or nose? No Did it require medical treatment? No When did it last happen?within the past 10 years If all above answers are "NO", may proceed with cephalosporin use.  . Avelox [Moxifloxacin] Other  (See Comments)    Muscle pain  . Benazepril Cough  . Bupropion     Dizziness, Headache  . Byetta 10 Mcg Pen [Exenatide] Diarrhea  . Isosorbide Swelling    Headaches, issues with balance  . Metformin Diarrhea  . Neurontin [Gabapentin] Other (See Comments)    Mouth blisters, joint pain, depression   . Pantoprazole     Other reaction(s): Abdominal Pain  . Sulfa Antibiotics Other (See Comments)    Headache  . Ceclor [Cefaclor] Rash       Assessment & Plan:   1. Benign essential hypertension Continue antihypertensive medications as already ordered, these medications have been reviewed and there are no changes at this time.   2. Varicose veins of both lower extremities with inflammation Recommend  I have reviewed my previous  discussion with the patient regarding  varicose veins and why they cause symptoms. Patient will continue  wearing graduated compression stockings class 1 on a daily basis, beginning first thing in the morning and removing them in the evening.    In addition, behavioral modification including elevation during the day was again discussed and this will continue.  The patient has utilized over the counter pain medications and has been exercising.  However, at this time conservative therapy has not alleviated the patient's symptoms of leg pain and swelling  Recommend: laser ablation of the right  great and short saphenous veins to eliminate the symptoms of pain and swelling of the lower extremities caused by the severe superficial venous reflux disease.   However the patient does have upcoming knee surgery. We will have the patient contact her office when she is done with her knee surgery and is been through physical therapy so that we can submit for laser at that time.  3. Pure hypercholesterolemia Continue statin as ordered and reviewed, no changes at this time    Current Outpatient Medications on File Prior to Visit  Medication Sig Dispense Refill  .  aspirin EC 81  MG tablet Take 81 mg by mouth daily.    . Cholecalciferol (VITAMIN D3) 25 MCG (1000 UT) CAPS Take 1,000 Units by mouth daily.     . clobetasol ointment (TEMOVATE) 0.05 % Apply topically.    . cyanocobalamin 1000 MCG tablet Take by mouth.     . donepezil (ARICEPT) 5 MG tablet Take 5 mg by mouth at bedtime.     . fexofenadine (ALLEGRA) 180 MG tablet Take 180 mg by mouth daily.     . furosemide (LASIX) 20 MG tablet Take 20 mg by mouth daily as needed.     Marland Kitchen ibuprofen (ADVIL,MOTRIN) 200 MG tablet Take 400 mg by mouth daily as needed for headache or moderate pain.     Marland Kitchen insulin degludec (TRESIBA FLEXTOUCH) 200 UNIT/ML FlexTouch Pen Inject 45 Units into the skin daily.     . isosorbide mononitrate (IMDUR) 30 MG 24 hr tablet Take 1 tablet by mouth daily.    Marland Kitchen lamoTRIgine (LAMICTAL) 25 MG tablet Take 25 mg in the morning and 50 mg at night for 2 weeks then increase to 50 mg two times a day    . losartan (COZAAR) 50 MG tablet Take 50 mg by mouth every morning.     . metoprolol succinate (TOPROL-XL) 25 MG 24 hr tablet Take 75 mg by mouth daily.    . nitroGLYCERIN (NITROSTAT) 0.4 MG SL tablet Place 1 tablet (0.4 mg total) under the tongue every 5 (five) minutes as needed for chest pain. 30 tablet 0  . ONETOUCH VERIO test strip 1 each 3 (three) times daily.    . pantoprazole (PROTONIX) 20 MG tablet Take 20 mg by mouth daily.    Marland Kitchen PARoxetine (PAXIL) 40 MG tablet Take 40 mg by mouth 2 (two) times daily.     . Semaglutide,0.25 or 0.5MG /DOS, 2 MG/1.5ML SOPN Take 0.25 mg weekly for 4 weeks then adjust to 0.5 mg weekly.    . traMADol (ULTRAM) 50 MG tablet Take 50-100 mg by mouth 2 (two) times daily as needed for moderate pain.     . valACYclovir (VALTREX) 500 MG tablet Take 500 mg by mouth 2 (two) times daily as needed (fever blisters).      No current facility-administered medications on file prior to visit.    There are no Patient Instructions on file for this visit. No follow-ups on  file.   Kris Hartmann, NP

## 2020-07-07 ENCOUNTER — Ambulatory Visit
Admission: RE | Admit: 2020-07-07 | Discharge: 2020-07-07 | Disposition: A | Discharge status: Home / Self Care | Payer: Medicare HMO | Source: Ambulatory Visit | Attending: Orthopedic Surgery | Admitting: Orthopedic Surgery

## 2020-07-07 ENCOUNTER — Other Ambulatory Visit: Payer: Self-pay

## 2020-07-07 DIAGNOSIS — M5442 Lumbago with sciatica, left side: Secondary | ICD-10-CM | POA: Diagnosis not present

## 2020-07-07 DIAGNOSIS — G8929 Other chronic pain: Secondary | ICD-10-CM | POA: Diagnosis present

## 2020-07-08 ENCOUNTER — Other Ambulatory Visit
Admission: RE | Admit: 2020-07-08 | Discharge: 2020-07-08 | Disposition: A | Discharge status: Home / Self Care | Payer: Medicare HMO | Source: Ambulatory Visit | Attending: Gastroenterology | Admitting: Gastroenterology

## 2020-07-08 DIAGNOSIS — Z01812 Encounter for preprocedural laboratory examination: Secondary | ICD-10-CM | POA: Insufficient documentation

## 2020-07-08 DIAGNOSIS — Z20822 Contact with and (suspected) exposure to covid-19: Secondary | ICD-10-CM | POA: Diagnosis not present

## 2020-07-09 LAB — SARS CORONAVIRUS 2 (TAT 6-24 HRS): SARS Coronavirus 2: NEGATIVE

## 2020-07-12 ENCOUNTER — Encounter
Admission: RE | Disposition: A | Discharge status: Home / Self Care | Payer: Self-pay | Source: Home / Self Care | Attending: Gastroenterology

## 2020-07-12 ENCOUNTER — Ambulatory Visit: Payer: Medicare HMO | Admitting: Anesthesiology

## 2020-07-12 ENCOUNTER — Ambulatory Visit
Admission: RE | Admit: 2020-07-12 | Discharge: 2020-07-12 | Disposition: A | Discharge status: Home / Self Care | Payer: Medicare HMO | Attending: Gastroenterology | Admitting: Gastroenterology

## 2020-07-12 ENCOUNTER — Encounter: Payer: Self-pay | Admitting: *Deleted

## 2020-07-12 ENCOUNTER — Other Ambulatory Visit: Payer: Self-pay

## 2020-07-12 DIAGNOSIS — Z881 Allergy status to other antibiotic agents status: Secondary | ICD-10-CM | POA: Diagnosis not present

## 2020-07-12 DIAGNOSIS — R1310 Dysphagia, unspecified: Secondary | ICD-10-CM | POA: Diagnosis not present

## 2020-07-12 DIAGNOSIS — Z1211 Encounter for screening for malignant neoplasm of colon: Secondary | ICD-10-CM | POA: Diagnosis present

## 2020-07-12 DIAGNOSIS — Z794 Long term (current) use of insulin: Secondary | ICD-10-CM | POA: Diagnosis not present

## 2020-07-12 DIAGNOSIS — K2100 Gastro-esophageal reflux disease with esophagitis, without bleeding: Secondary | ICD-10-CM | POA: Diagnosis not present

## 2020-07-12 DIAGNOSIS — Z880 Allergy status to penicillin: Secondary | ICD-10-CM | POA: Diagnosis not present

## 2020-07-12 DIAGNOSIS — K5730 Diverticulosis of large intestine without perforation or abscess without bleeding: Secondary | ICD-10-CM | POA: Insufficient documentation

## 2020-07-12 DIAGNOSIS — Z882 Allergy status to sulfonamides status: Secondary | ICD-10-CM | POA: Diagnosis not present

## 2020-07-12 DIAGNOSIS — K635 Polyp of colon: Secondary | ICD-10-CM | POA: Insufficient documentation

## 2020-07-12 DIAGNOSIS — Z8719 Personal history of other diseases of the digestive system: Secondary | ICD-10-CM | POA: Diagnosis not present

## 2020-07-12 DIAGNOSIS — Z800 Family history of malignant neoplasm of digestive organs: Secondary | ICD-10-CM | POA: Diagnosis not present

## 2020-07-12 DIAGNOSIS — Z86010 Personal history of colonic polyps: Secondary | ICD-10-CM | POA: Diagnosis not present

## 2020-07-12 DIAGNOSIS — Z79899 Other long term (current) drug therapy: Secondary | ICD-10-CM | POA: Insufficient documentation

## 2020-07-12 DIAGNOSIS — K449 Diaphragmatic hernia without obstruction or gangrene: Secondary | ICD-10-CM | POA: Insufficient documentation

## 2020-07-12 DIAGNOSIS — Z7982 Long term (current) use of aspirin: Secondary | ICD-10-CM | POA: Diagnosis not present

## 2020-07-12 DIAGNOSIS — K2270 Barrett's esophagus without dysplasia: Secondary | ICD-10-CM | POA: Diagnosis not present

## 2020-07-12 DIAGNOSIS — Z888 Allergy status to other drugs, medicaments and biological substances status: Secondary | ICD-10-CM | POA: Diagnosis not present

## 2020-07-12 HISTORY — PX: ESOPHAGOGASTRODUODENOSCOPY: SHX5428

## 2020-07-12 HISTORY — PX: COLONOSCOPY: SHX5424

## 2020-07-12 LAB — GLUCOSE, CAPILLARY: Glucose-Capillary: 121 mg/dL — ABNORMAL HIGH (ref 70–99)

## 2020-07-12 SURGERY — EGD (ESOPHAGOGASTRODUODENOSCOPY)
Anesthesia: General

## 2020-07-12 MED ORDER — PHENYLEPHRINE HCL (PRESSORS) 10 MG/ML IV SOLN
INTRAVENOUS | Status: DC | PRN
  Administered 2020-07-12 (×2): 100 ug via INTRAVENOUS

## 2020-07-12 MED ORDER — PROPOFOL 10 MG/ML IV BOLUS
INTRAVENOUS | Status: DC | PRN
  Administered 2020-07-12: 40 mg via INTRAVENOUS
  Administered 2020-07-12: 20 mg via INTRAVENOUS

## 2020-07-12 MED ORDER — LIDOCAINE HCL (PF) 2 % IJ SOLN
INTRAMUSCULAR | Status: DC | PRN
  Administered 2020-07-12: 80 mg via INTRADERMAL

## 2020-07-12 MED ORDER — SODIUM CHLORIDE 0.9 % IV SOLN: INTRAVENOUS | Status: DC

## 2020-07-12 MED ORDER — PROPOFOL 500 MG/50ML IV EMUL
INTRAVENOUS | Status: DC | PRN
  Administered 2020-07-12: 150 ug/kg/min via INTRAVENOUS

## 2020-07-12 NOTE — Anesthesia Procedure Notes (Signed)
Date/Time: 07/12/2020 9:07 AM Performed by: Nelda Marseille, CRNA Pre-anesthesia Checklist: Patient identified, Emergency Drugs available, Suction available, Patient being monitored and Timeout performed Oxygen Delivery Method: Nasal cannula

## 2020-07-12 NOTE — Anesthesia Preprocedure Evaluation (Addendum)
Anesthesia Evaluation  Patient identified by MRN, date of birth, ID band Patient awake    Reviewed: Allergy & Precautions, NPO status , Patient's Chart, lab work & pertinent test results  History of Anesthesia Complications Negative for: history of anesthetic complications  Airway Mallampati: I  TM Distance: >3 FB Neck ROM: Full    Dental no notable dental hx. (+) Teeth Intact   Pulmonary sleep apnea and Continuous Positive Airway Pressure Ventilation , COPD, Patient abstained from smoking.Not current smoker, former smoker,  S/p UPPP, says she thinks her OSA has been worse since it. However, stopped wearing her cpap after her cardiac stent in 2020 as her dyspnea improved   Pulmonary exam normal breath sounds clear to auscultation       Cardiovascular Exercise Tolerance: Good METShypertension, + CAD, + Cardiac Stents and +CHF  (-) Past MI (-) dysrhythmias + Valvular Problems/Murmurs  Rhythm:Regular Rate:Normal - Systolic murmurs Stents, last placed in 2020 TTE 2016: - Left ventricle: The cavity size was normal. Systolic function was  normal. The estimated ejection fraction was 60%. Wall motion was  normal; there were no regional wall motion abnormalities. Doppler  parameters are consistent with abnormal left ventricular  relaxation (grade 1 diastolic dysfunction).  - Aortic valve: There was mild regurgitation. Valve area (Vmax):  1.83 cm^2.  - Mitral valve: There was mild regurgitation.  - Atrial septum: No defect or patent foramen ovale was identified.  - Tricuspid valve: There was moderate regurgitation.    Neuro/Psych PSYCHIATRIC DISORDERS Anxiety negative neurological ROS     GI/Hepatic PUD, GERD  Controlled and Medicated,(+)     (-) substance abuse  ,   Endo/Other  diabetes  Renal/GU negative Renal ROS     Musculoskeletal   Abdominal   Peds  Hematology   Anesthesia Other Findings Past Medical  History: No date: Anginal pain ([place]) No date: Anxiety No date: Arthritis     Comment:  knees No date: Barrett's esophagus 2014: Breast cancer ([place])     Comment:  RT LUMPECTOMY 2014: Cancer ([place])     Comment:  right breast - s\p Radiation and lumpectomy and LN               dissection No date: Chronic sinusitis No date: Complication of anesthesia     Comment:  hard to wake after Fentanyl 2012: COPD (chronic obstructive pulmonary disease) ([place]) No date: Coronary artery disease No date: Degenerative lumbar disc No date: Diabetes mellitus without complication (HCC) No date: Dysphagia No date: Esophageal reflux No date: Fatty liver No date: History of Helicobacter pylori infection No date: Hypercholesteremia No date: Hyperlipidemia No date: Hypertension No date: Personal history of radiation therapy No date: Pneumonia 2014: Radiation     Comment:  RT BREAST CA No date: Sleep apnea     Comment:  CPAP No date: Ulcer, stomach peptic     Comment:  history of No date: Vertigo     Comment:  last episode 2017(approx)  Reproductive/Obstetrics                            Anesthesia Physical Anesthesia Plan  ASA: III  Anesthesia Plan: General   Post-op Pain Management:    Induction: Intravenous  PONV Risk Score and Plan: 3 and Ondansetron, Propofol infusion and TIVA  Airway Management Planned: Nasal Cannula  Additional Equipment: None  Intra-op Plan:   Post-operative Plan:   Informed Consent: I have reviewed the patients History  and Physical, chart, labs and discussed the procedure including the risks, benefits and alternatives for the proposed anesthesia with the patient or authorized representative who has indicated his/her understanding and acceptance.     Dental advisory given  Plan Discussed with: CRNA and Surgeon  Anesthesia Plan Comments: (Discussed risks of anesthesia with patient, including possibility of difficulty with spontaneous  ventilation under anesthesia necessitating airway intervention, PONV, and rare risks such as cardiac or respiratory or neurological events. Patient understands.)        Anesthesia Quick Evaluation

## 2020-07-12 NOTE — Op Note (Signed)
[facility] Gastroenterology Patient Name: Tammy Hatfield Procedure Date: 07/12/2020 8:36 AM MRN: [id number] Account #: [id number] Date of Birth: 10-Oct-1945 Admit Type: Outpatient Age: 74 Room: [facility] ENDO ROOM 1 Gender: Female Note Status: Finalized Procedure:             Upper GI endoscopy Indications:           Dysphagia, Gastro-esophageal reflux disease, Barrett's                         esophagus Providers:             Andrey Farmer MD, MD Medicines:             Monitored Anesthesia Care Complications:         No immediate complications. Estimated blood loss:                         Minimal. Procedure:             Pre-Anesthesia Assessment:                        - Prior to the procedure, a History and Physical was                         performed, and patient medications and allergies were                         reviewed. The patient is competent. The risks and                         benefits of the procedure and the sedation options and                         risks were discussed with the patient. All questions                         were answered and informed consent was obtained.                         Patient identification and proposed procedure were                         verified by the physician, the nurse, the anesthetist                         and the technician in the endoscopy suite. Mental                         Status Examination: alert and oriented. Airway                         Examination: normal oropharyngeal airway and neck                         mobility. Respiratory Examination: clear to                         auscultation. CV Examination: normal. Prophylactic  Antibiotics: The patient does not require prophylactic                         antibiotics. Prior Anticoagulants: The patient has                         taken no previous anticoagulant or antiplatelet                         agents. ASA Grade  Assessment: III - A patient with                         severe systemic disease. After reviewing the risks and                         benefits, the patient was deemed in satisfactory                         condition to undergo the procedure. The anesthesia                         plan was to use monitored anesthesia care (MAC).                         Immediately prior to administration of medications,                         the patient was re-assessed for adequacy to receive                         sedatives. The heart rate, respiratory rate, oxygen                         saturations, blood pressure, adequacy of pulmonary                         ventilation, and response to care were monitored                         throughout the procedure. The physical status of the                         patient was re-assessed after the procedure.                        After obtaining informed consent, the endoscope was                         passed under direct vision. Throughout the procedure,                         the patient's blood pressure, pulse, and oxygen                         saturations were monitored continuously. The Endoscope                         was introduced through the mouth, and advanced to the  second part of duodenum. The upper GI endoscopy was                         accomplished without difficulty. The patient tolerated                         the procedure well. Findings:      No endoscopic abnormality was evident in the esophagus to explain the       patient's complaint of dysphagia.      The esophagus and gastroesophageal junction were examined with white       light. There were esophageal mucosal changes secondary to established       short-segment Barrett's disease. These changes involved the mucosa       extending to the Z-line. Scattered islands of squamous mucosa were       present. Mucosa was biopsied with a cold forceps for  histology in a       targeted manner in the lower third of the esophagus. One specimen bottle       was sent to pathology. Estimated blood loss was minimal.      A small hiatal hernia was present.      The entire examined stomach was normal.      The examined duodenum was normal. Impression:            - No endoscopic esophageal abnormality to explain                         patient's dysphagia.                        - Esophageal mucosal changes secondary to established                         short-segment Barrett's disease. Biopsied.                        - Small hiatal hernia.                        - Normal stomach.                        - Normal examined duodenum. Recommendation:        - Discharge patient to home.                        - Resume previous diet.                        - Continue present medications.                        - Await pathology results.                        - Repeat upper endoscopy for surveillance based on                         pathology results.                        - Return to referring physician as previously  scheduled. Procedure Code(s):     --- Professional ---                        [phone], Esophagogastroduodenoscopy, flexible,                         transoral; with biopsy, single or multiple Diagnosis Code(s):     --- Professional ---                        R13.10, Dysphagia, unspecified                        K22.70, Barrett's esophagus without dysplasia                        K44.9, Diaphragmatic hernia without obstruction or                         gangrene                        K21.9, Gastro-esophageal reflux disease without                         esophagitis CPT copyright 2019 American Medical Association. All rights reserved. The codes documented in this report are preliminary and upon coder review may  be revised to meet current compliance requirements. Andrey Farmer, MD Andrey Farmer MD,  MD 07/12/2020 9:47:05 AM Number of Addenda: 0 Note Initiated On: 07/12/2020 8:36 AM Estimated Blood Loss:  Estimated blood loss was minimal.      [facility]

## 2020-07-12 NOTE — Transfer of Care (Signed)
Immediate Anesthesia Transfer of Care Note  Patient: Tammy Hatfield  Procedure(s) Performed: ESOPHAGOGASTRODUODENOSCOPY (EGD) (N/A ) COLONOSCOPY (N/A )  Patient Location: PACU  Anesthesia Type:General  Level of Consciousness: awake and sedated  Airway & Oxygen Therapy: Patient Spontanous Breathing and Patient connected to nasal cannula oxygen  Post-op Assessment: Report given to RN and Post -op Vital signs reviewed and stable  Post vital signs: Reviewed and stable  Last Vitals:  Vitals Value Taken Time  BP 92/56 07/12/20 0947  Temp 36.3 C 07/12/20 0945  Pulse 71 07/12/20 0951  Resp 14 07/12/20 0951  SpO2 100 % 07/12/20 0951  Vitals shown include unvalidated device data.  Last Pain:  Vitals:   07/12/20 0945  TempSrc: Temporal  PainSc: Asleep         Complications: No complications documented.

## 2020-07-12 NOTE — H&P (Signed)
Outpatient short stay form Pre-procedure 07/12/2020 8:37 AM Tammy Miyamoto MD, MPH  Primary Physician: Dr. Edwina Barth  Reason for visit:  Dysphagia and Surveillance  History of present illness:   74 y/o with reported history of BE's but not on biopsies and family history of colon cancer in her mother in her 3's possibly here for EGD/Colonoscopy. Not on blood thinners. History of hysterectomy and ventral hernia repair. History of diverticulitis earlier this year but no pain currently.    Current Facility-Administered Medications:  .  0.9 %  sodium chloride infusion, , Intravenous, Continuous, Vasili Fok, Hilton Cork, MD  Medications Prior to Admission  Medication Sig Dispense Refill Last Dose  . aspirin EC 81 MG tablet Take 81 mg by mouth daily.   07/11/2020 at 0800  . calcium carbonate (TUMS - DOSED IN MG ELEMENTAL CALCIUM) 500 MG chewable tablet Chew 1,500 mg by mouth daily as needed for indigestion or heartburn.   07/11/2020 at 0800  . Carboxymethylcellul-Glycerin (LUBRICATING EYE DROPS OP) Place 1 drop into both eyes daily as needed (dry eyes).   Past Week at Unknown time  . Cholecalciferol (VITAMIN D3) 25 MCG (1000 UT) CAPS Take 1,000 Units by mouth daily.    Past Week at Unknown time  . cyanocobalamin 1000 MCG tablet Take 1,000 mcg by mouth daily.    Past Week at Unknown time  . fexofenadine (ALLEGRA) 180 MG tablet Take 180 mg by mouth daily as needed for allergies.    Past Week at Unknown time  . ibuprofen (ADVIL,MOTRIN) 200 MG tablet Take 400 mg by mouth daily as needed for headache or moderate pain.    Past Week at Unknown time  . insulin degludec (TRESIBA FLEXTOUCH) 200 UNIT/ML FlexTouch Pen Inject 66 Units into the skin at bedtime.    07/11/2020 at 0800  . isosorbide mononitrate (IMDUR) 30 MG 24 hr tablet Take 30 mg by mouth daily.    07/11/2020 at 0800  . losartan (COZAAR) 50 MG tablet Take 50 mg by mouth every morning.    07/12/2020 at 0600  . metoprolol succinate (TOPROL-XL) 50 MG 24  hr tablet Take 50 mg by mouth daily. Take with or immediately following a meal.   07/12/2020 at 0600  . nitroGLYCERIN (NITROSTAT) 0.4 MG SL tablet Place 1 tablet (0.4 mg total) under the tongue every 5 (five) minutes as needed for chest pain. 30 tablet 0 Past Month at Unknown time  . ONETOUCH VERIO test strip 1 each 3 (three) times daily.   07/12/2020 at Unknown time  . pantoprazole (PROTONIX) 20 MG tablet Take 20 mg by mouth daily.   07/12/2020 at 0600  . PARoxetine (PAXIL) 20 MG tablet Take 20 mg by mouth daily.   07/11/2020 at 0800  . Semaglutide,0.25 or 0.5MG /DOS, 2 MG/1.5ML SOPN Inject 1 mg as directed every Sunday.      . traMADol (ULTRAM) 50 MG tablet Take 50-100 mg by mouth every 6 (six) hours as needed for moderate pain.    07/11/2020 at 0800  . valACYclovir (VALTREX) 500 MG tablet Take 500 mg by mouth 2 (two) times daily as needed (fever blisters).    Past Week at Unknown time     Allergies  Allergen Reactions  . Avelox [Moxifloxacin] Other (See Comments)    debilitating muscle pain  . Donepezil Other (See Comments)    Patient has severe nightmares   . Amoxicillin Other (See Comments)    Yeast infection DID THE REACTION INVOLVE: Swelling of the face/tongue/throat, SOB, or low  BP? No Sudden or severe rash/hives, skin peeling, or the inside of the mouth or nose? No Did it require medical treatment? No When did it last happen?within the past 10 years If all above answers are "NO", may proceed with cephalosporin use.  . Benazepril Cough  . Bupropion     Dizziness, Headache  . Byetta 10 Mcg Pen [Exenatide] Diarrhea  . Isosorbide Swelling    Headaches, issues with balance  . Metformin Diarrhea  . Neurontin [Gabapentin] Other (See Comments)    Mouth blisters, joint pain, depression   . Pantoprazole      Abdominal Pain  . Sulfa Antibiotics Other (See Comments)    Headache  . Ceclor [Cefaclor] Rash     Past Medical History:  Diagnosis Date  . Anginal pain ([place])   .  Anxiety   . Arthritis    knees  . Barrett's esophagus   . Breast cancer ([place]) 2014   RT LUMPECTOMY  . Cancer [facility]) 2014   right breast - s\p Radiation and lumpectomy and LN dissection  . Chronic sinusitis   . Complication of anesthesia    hard to wake after Fentanyl  . COPD (chronic obstructive pulmonary disease) ([place]) 2012  . Coronary artery disease   . Degenerative lumbar disc   . Diabetes mellitus without complication ([place])   . Dysphagia   . Esophageal reflux   . Fatty liver   . History of Helicobacter pylori infection   . Hypercholesteremia   . Hyperlipidemia   . Hypertension   . Personal history of radiation therapy   . Pneumonia   . Radiation 2014   RT BREAST CA  . Sleep apnea    CPAP  . Ulcer, stomach peptic    history of  . Vertigo    last episode 2017(approx)    Review of systems:  Otherwise negative.    Physical Exam  Gen: Alert, oriented. Appears stated age.  HEENT: PERRLA. Lungs: No respiratory distress CV: RRR Abd: soft, benign, no masses. Ext: No edema.     Planned procedures: Proceed with EGD/colonoscopy. The patient understands the nature of the planned procedure, indications, risks, alternatives and potential complications including but not limited to bleeding, infection, perforation, damage to internal organs and possible oversedation/side effects from anesthesia. The patient agrees and gives consent to proceed.  Please refer to procedure notes for findings, recommendations and patient disposition/instructions.     Tammy Miyamoto MD, MPH Gastroenterology 07/12/2020  8:37 AM

## 2020-07-12 NOTE — Anesthesia Postprocedure Evaluation (Signed)
Anesthesia Post Note  Patient: Tammy Hatfield  Procedure(s) Performed: ESOPHAGOGASTRODUODENOSCOPY (EGD) (N/A ) COLONOSCOPY (N/A )  Patient location during evaluation: Endoscopy Anesthesia Type: General Level of consciousness: awake and alert Pain management: pain level controlled Vital Signs Assessment: post-procedure vital signs reviewed and stable Respiratory status: spontaneous breathing, nonlabored ventilation, respiratory function stable and patient connected to nasal cannula oxygen Cardiovascular status: blood pressure returned to baseline and stable Postop Assessment: no apparent nausea or vomiting Anesthetic complications: no   No complications documented.   Last Vitals:  Vitals:   07/12/20 0829 07/12/20 0945  BP: (!) 146/65 (!) 92/56  Pulse: 74   Resp: 18   Temp: (!) 36.2 C (!) 36.3 C  SpO2: 96%     Last Pain:  Vitals:   07/12/20 1015  TempSrc:   PainSc: 0-No pain                 Arita Miss

## 2020-07-12 NOTE — Op Note (Signed)
[facility] Gastroenterology Patient Name: Tammy Hatfield Procedure Date: 07/12/2020 8:34 AM MRN: [id number] Account #: [id number] Date of Birth: [birth year] Admit Type: Outpatient Age: 74 Room: [facility] ENDO ROOM 1 Gender: Female Note Status: Finalized Procedure:             Colonoscopy Indications:           High risk colon cancer surveillance: Personal history                         of colonic polyps, Family history of colon cancer Providers:             Andrey Farmer MD, MD Medicines:             Monitored Anesthesia Care Complications:         No immediate complications. Estimated blood loss:                         Minimal. Procedure:             Pre-Anesthesia Assessment:                        - Prior to the procedure, a History and Physical was                         performed, and patient medications and allergies were                         reviewed. The patient is competent. The risks and                         benefits of the procedure and the sedation options and                         risks were discussed with the patient. All questions                         were answered and informed consent was obtained.                         Patient identification and proposed procedure were                         verified by the physician, the nurse, the anesthetist                         and the technician in the endoscopy suite. Mental                         Status Examination: alert and oriented. Airway                         Examination: normal oropharyngeal airway and neck                         mobility. Respiratory Examination: clear to                         auscultation. CV Examination: normal. Prophylactic  Antibiotics: The patient does not require prophylactic                         antibiotics. Prior Anticoagulants: The patient has                         taken no previous anticoagulant or antiplatelet                          agents. ASA Grade Assessment: III - A patient with                         severe systemic disease. After reviewing the risks and                         benefits, the patient was deemed in satisfactory                         condition to undergo the procedure. The anesthesia                         plan was to use monitored anesthesia care (MAC).                         Immediately prior to administration of medications,                         the patient was re-assessed for adequacy to receive                         sedatives. The heart rate, respiratory rate, oxygen                         saturations, blood pressure, adequacy of pulmonary                         ventilation, and response to care were monitored                         throughout the procedure. The physical status of the                         patient was re-assessed after the procedure.                        After obtaining informed consent, the colonoscope was                         passed under direct vision. Throughout the procedure,                         the patient's blood pressure, pulse, and oxygen                         saturations were monitored continuously. The                         Colonoscope was introduced through the anus and  advanced to the the cecum, identified by appendiceal                         orifice and ileocecal valve. The colonoscopy was                         technically difficult and complex due to restricted                         mobility of the colon. Successful completion of the                         procedure was aided by withdrawing the scope and                         replacing with the pediatric colonoscope. The patient                         tolerated the procedure well. The quality of the bowel                         preparation was good. Findings:      The perianal and digital rectal examinations were normal.      A 1 mm  polyp was found in the appendiceal orifice. The polyp was       sessile. The polyp was removed with a jumbo cold forceps. Resection and       retrieval were complete. Estimated blood loss was minimal.      A single large-mouthed diverticulum was found in the ascending colon.      Many small and large-mouthed diverticula were found in the sigmoid colon.      The exam was otherwise without abnormality on direct and retroflexion       views. Impression:            - One 1 mm polyp at the appendiceal orifice, removed                         with a jumbo cold forceps. Resected and retrieved.                        - Diverticulosis in the ascending colon.                        - Diverticulosis in the sigmoid colon.                        - The examination was otherwise normal on direct and                         retroflexion views. Recommendation:        - Discharge patient to home.                        - Resume previous diet.                        - Continue present medications.                        -  Await pathology results.                        - Repeat colonoscopy in 5 years for surveillance if                         benefits outweigh risks based on clinical status.                        - Return to referring physician as previously                         scheduled. Procedure Code(s):     --- Professional ---                        [phone], Colonoscopy, flexible; with biopsy, single or                         multiple Diagnosis Code(s):     --- Professional ---                        Z86.010, Personal history of colonic polyps                        K63.5, Polyp of colon                        Z80.0, Family history of malignant neoplasm of                         digestive organs                        K57.30, Diverticulosis of large intestine without                         perforation or abscess without bleeding CPT copyright 2019 American Medical Association. All rights  reserved. The codes documented in this report are preliminary and upon coder review may  be revised to meet current compliance requirements. Andrey Farmer, MD Andrey Farmer MD, MD 07/12/2020 9:51:41 AM Number of Addenda: 0 Note Initiated On: 07/12/2020 8:34 AM Scope Withdrawal Time: 0 hours 8 minutes 53 seconds  Total Procedure Duration: 0 hours 25 minutes 49 seconds  Estimated Blood Loss:  Estimated blood loss was minimal.      [facility]

## 2020-07-12 NOTE — Interval H&P Note (Signed)
History and Physical Interval Note:  07/12/2020 8:39 AM  Tammy Hatfield  has presented today for surgery, with the diagnosis of PERSONAL HX.OF [place].  The various methods of treatment have been discussed with the patient and family. After consideration of risks, benefits and other options for treatment, the patient has consented to  Procedure(s): ESOPHAGOGASTRODUODENOSCOPY (EGD) (N/A) COLONOSCOPY (N/A) as a surgical intervention.  The patient's history has been reviewed, patient examined, no change in status, stable for surgery.  I have reviewed the patient's chart and labs.  Questions were answered to the patient's satisfaction.     [name]  Ok to proceed with EGD/Colonoscopy

## 2020-07-13 ENCOUNTER — Ambulatory Visit
Admission: RE | Admit: 2020-07-13 | Discharge: 2020-07-13 | Disposition: A | Discharge status: Home / Self Care | Payer: Medicare HMO | Source: Ambulatory Visit | Attending: Oncology | Admitting: Oncology

## 2020-07-13 ENCOUNTER — Encounter
Admission: RE | Admit: 2020-07-13 | Discharge: 2020-07-13 | Disposition: A | Discharge status: Home / Self Care | Payer: Medicare HMO | Source: Ambulatory Visit | Attending: Surgery | Admitting: Surgery

## 2020-07-13 ENCOUNTER — Other Ambulatory Visit: Payer: Self-pay

## 2020-07-13 DIAGNOSIS — Z1231 Encounter for screening mammogram for malignant neoplasm of breast: Secondary | ICD-10-CM | POA: Insufficient documentation

## 2020-07-13 DIAGNOSIS — Z853 Personal history of malignant neoplasm of breast: Secondary | ICD-10-CM | POA: Diagnosis not present

## 2020-07-13 DIAGNOSIS — C50211 Malignant neoplasm of upper-inner quadrant of right female breast: Secondary | ICD-10-CM

## 2020-07-13 HISTORY — DX: Personal history of other diseases of the digestive system: Z87.19

## 2020-07-13 HISTORY — DX: Hemorrhagic condition, unspecified: D69.9

## 2020-07-13 HISTORY — DX: Spontaneous ecchymoses: R23.3

## 2020-07-13 HISTORY — DX: Other skin changes: R23.8

## 2020-07-13 HISTORY — DX: Dyspnea, unspecified: R06.00

## 2020-07-13 LAB — CBC WITH DIFFERENTIAL/PLATELET
Abs Immature Granulocytes: 0.03 10*3/uL (ref 0.00–0.07)
Basophils Absolute: 0.1 10*3/uL (ref 0.0–0.1)
Basophils Relative: 1 %
Eosinophils Absolute: 0.2 10*3/uL (ref 0.0–0.5)
Eosinophils Relative: 3 %
HCT: 40 % (ref 36.0–46.0)
Hemoglobin: 13.2 g/dL (ref 12.0–15.0)
Immature Granulocytes: 0 %
Lymphocytes Relative: 30 %
Lymphs Abs: 2 10*3/uL (ref 0.7–4.0)
MCH: 29.7 pg (ref 26.0–34.0)
MCHC: 33 g/dL (ref 30.0–36.0)
MCV: 90.1 fL (ref 80.0–100.0)
Monocytes Absolute: 0.4 10*3/uL (ref 0.1–1.0)
Monocytes Relative: 7 %
Neutro Abs: 4 10*3/uL (ref 1.7–7.7)
Neutrophils Relative %: 59 %
Platelets: 212 10*3/uL (ref 150–400)
RBC: 4.44 MIL/uL (ref 3.87–5.11)
RDW: 13.2 % (ref 11.5–15.5)
WBC: 6.7 10*3/uL (ref 4.0–10.5)
nRBC: 0 % (ref 0.0–0.2)

## 2020-07-13 LAB — URINALYSIS, ROUTINE W REFLEX MICROSCOPIC
Bilirubin Urine: NEGATIVE
Glucose, UA: 50 mg/dL — AB
Hgb urine dipstick: NEGATIVE
Ketones, ur: NEGATIVE mg/dL
Leukocytes,Ua: NEGATIVE
Nitrite: NEGATIVE
Protein, ur: NEGATIVE mg/dL
Specific Gravity, Urine: 1.015 (ref 1.005–1.030)
pH: 5 (ref 5.0–8.0)

## 2020-07-13 LAB — COMPREHENSIVE METABOLIC PANEL
ALT: 12 U/L (ref 0–44)
AST: 17 U/L (ref 15–41)
Albumin: 3.9 g/dL (ref 3.5–5.0)
Alkaline Phosphatase: 84 U/L (ref 38–126)
Anion gap: 10 (ref 5–15)
BUN: 18 mg/dL (ref 8–23)
CO2: 25 mmol/L (ref 22–32)
Calcium: 9 mg/dL (ref 8.9–10.3)
Chloride: 102 mmol/L (ref 98–111)
Creatinine, Ser: 1.12 mg/dL — ABNORMAL HIGH (ref 0.44–1.00)
GFR, Estimated: 52 mL/min — ABNORMAL LOW (ref >60)
Glucose, Bld: 176 mg/dL — ABNORMAL HIGH (ref 70–99)
Potassium: 3.9 mmol/L (ref 3.5–5.1)
Sodium: 137 mmol/L (ref 135–145)
Total Bilirubin: 0.6 mg/dL (ref 0.3–1.2)
Total Protein: 6.8 g/dL (ref 6.5–8.1)

## 2020-07-13 LAB — TYPE AND SCREEN
ABO/RH(D): A POS
Antibody Screen: NEGATIVE

## 2020-07-13 LAB — SURGICAL PCR SCREEN
MRSA, PCR: NEGATIVE
Staphylococcus aureus: NEGATIVE

## 2020-07-13 LAB — SURGICAL PATHOLOGY

## 2020-07-13 NOTE — Progress Notes (Signed)
[facility] Perioperative Services: Pre-Admission/Anesthesia Testing   Date: 07/13/20 Name: Tammy Hatfield MRN:   [id number]  Re: Consideration of preoperative prophylactic antibiotic change   Request sent to: Poggi, Marshall Cork, MD (routed and/or faxed via [facility])  Planned Surgical Procedure(s):    Case: [id number] Date/Time: 07/21/20 0715   Procedure: TOTAL KNEE ARTHROPLASTY (Left Knee)   Anesthesia type: Choice   Pre-op diagnosis: Primary osteoarthritisi of left knee.   Location: [facility] OR ROOM 03 / [facility]ESIA [facility]   Surgeons: Corky Mull, MD     Notes: 1. Patient has a documented intolerance to AMOXICILLIN and CEFACLOR . Advising that PCN has caused her to experience vulvovaginal candidiasis in the past. . Advising that CEFACLOR has caused her to experience a low severity rash in the past.   2. Received has taken cephalosporins with no documented complications . Oral CEPHALEXIN prescribed on 01/18/2016 . Ophthalmic CEFUROXIME prescribed on 04/01/2018 and 04/30/2018  3. Screened as appropriate for cephalosporin use during medication reconciliation . No immediate angioedema, dysphagia, SOB, anaphylaxis symptoms. . No severe rash involving mucous membranes or skin necrosis. . No hospital admissions related to side effects of PCN/cephalosporin use.  . No documented reaction to PCN or cephalosporin in the last 10 years.  Request:  As an evidence based approach to reducing the rate of incidence for post-operative SSI and the development of MDROs, could an agent with narrower coverage for preoperative prophylaxis in this patient's upcoming surgical course be considered?  1. Currently ordered preoperative prophylactic ABX: clindamycin.   2. Specifically requesting change to cephalosporin (CEFAZOLIN).   3. Please communicate decision with me and I will change the orders in Epic as per your direction.   Things to consider:  Many patients report that  they were "allergic" to PCN earlier in life, however this does not translate into a true lifelong allergy. Patients can lose sensitivity to specific IgE antibodies over time if PCN is avoided (Kleris & Lugar, 2019).   Up to 10% of the adult population and 15% of hospitalized patients report an allergy to PCN, however clinical studies suggest that 90% of those reporting an allergy can tolerate PCN antibiotics (Kleris & Lugar, 2019).   Cross-sensitivity between PCN and cephalosporins has been documented as being as high as 10%, however this estimation included data believed to have been collected in a setting where there was contamination. Newer data suggests that the prevalence of cross-sensitivity between PCN and cephalosporins is actually estimated to be closer to 1% (Hermanides et al., 2018).    Patients labeled as PCN allergic, whether they are truly allergic or not, have been found to have inferior outcomes in terms of rates of serious infection, and these patients tend to have longer hospital stays ([place], 2019).   Treatment related secondary infections, such as Clostridioides difficile, have been linked to the improper use of broad spectrum antibiotics in patients improperly labeled as PCN allergic (Kleris & Lugar, 2019).   Anaphylaxis from cephalosporins is rare and the evidence suggests that there is no increased risk of an anaphylactic type reaction when cephalosporins are used in a PCN allergic patient (Pichichero, 2006).  Citations: Hermanides J, Lemkes BA, Prins Pearla Dubonnet MW, Terreehorst I. Presumed ?-Lactam Allergy and Cross-reactivity in the Operating Theater: A Practical Approach. Anesthesiology. 2018 Aug;129(2):335-342. doi: 10.1097/ALN.0000000000002252. PMID: 97673419.  Kleris, Ridge[place]., & Lugar, P. L. (2019). Things We Do For No Reason: Failing to Question a Penicillin Allergy History. Journal of  hospital medicine, 14(10), [phone]. Advance online publication.  https://www.wallace-middleton.info/  Pichichero, M. E. (2006). Cephalosporins can be prescribed safely for penicillin-allergic patients. Journal of family medicine, 55(2), 106-112. Accessed: https://cdn.mdedge.com/files/s67fs-public/Document/September-2017/5502JFP_AppliedEvidence1.pdf   Honor Loh, MSN, APRN, FNP-C, CEN [facility]  Peri-operative Services Nurse Practitioner 07/13/20 3:31 PM

## 2020-07-13 NOTE — Pre-Procedure Instructions (Signed)
Progress Notes - documented in this encounter Tammy Cowman, MD - 12/22/2019 10:30 AM EDT Formatting of this note is different from the original. Established Patient Visit   Chief Complaint: Chief Complaint  Patient presents with  . Follow-up  3 months  . Shortness of Breath  when walking, feels alot of pressure. says it is getting worse.  Date of Service: 12/22/2019 Date of Birth: [birth year] PCP: Velna Ochs, MD  History of Present Illness: Ms. Pennings is a 74 y.o.female patient who returns today for  1. DES mid LAD 07/05/2017 at [facility] 2. DES mid left circumflex, and proximal LAD 10/07/2018 [facility] 3. Residual 70% stenosis RCA, 90% stenosis distal OM 3 4. Essential hypertension 5. Hyperlipidemia 6. Type 2 diabetes 7. Sleep apnea 8. Patent stents prox/mid LAD, mid LCx, 02/25/2019  Recent Lexiscan Myoview 11/19/2018 revealed LV ejection fraction of 64% with lateral wall ischemia. The patient underwent cardiac catheterization 02/25/2019 which revealed patent stents proximal and mid D, and mid left circumflex, with 75% stenosis OM 3, and 70% stenosis distal RCA. Left ventriculography revealed normal LV function with elevated LVEDP of 31 and furosemide was increased to 40 mg daily with clinical improvement.  Patient returns today, reports "doing fair". She continues to experience occasional episodes of chest discomfort with moderate levels of exertion, with such activities as having intercourse. She has chronic exertional dyspnea which is stable and unchanged. Symptoms have persisted as the beginning following PCI 07/05/2017 and 10/07/2018. She denies palpitations or heart racing. She denies peripheral edema. The patient does complain of easy bruising secondary to dual antiplatelet therapy. The patient is active but does not do any structured exercise during COVID-19 pandemic.  The patient has essential hypertension, systolic blood pressure mildly elevated currently on metoprolol  succinate and losartan, which are well tolerated without apparent side effects. The patient follows a low-sodium, no added salt diet.  The patient has hyperlipidemia, on rosuvastatin 40 mg daily. The patient cut her dose in half to 20 mg daily due to muscle ache. The patient follows a low-cholesterol, low-fat diet.  The patient has type 2 diabetes, on insulin, and glipizide, which are well tolerated without apparent side effects, followed by her primary care provider. The patient follows a low carbohydrate, [name] diet.  Past Medical and Surgical History  Past Medical History Past Medical History:  Diagnosis Date  . Abdominal hernia  . Acute diastolic congestive heart failure (CMS-HCC) 02/22/2015  . Anxiety  . Aortic atherosclerosis (CMS-HCC)  ON CT imaging 05/02/2016  . Breast cancer (CMS-HCC) 2014  Follows with Dr Grayland Ormond, s/p radiation, on anastrozole  . Cataracts, bilateral  . CHF (congestive heart failure) (CMS-HCC)  . Chronic kidney disease  . Chronic sinusitis  . Colon polyp  . COPD (chronic obstructive pulmonary disease) (CMS-HCC)  . Coronary artery disease involving native coronary artery of native heart without angina pectoris 07/10/2017  S/p stent 06/2017, s/p angioplasty 02/2019  . Depression  . Diverticulosis of large intestine without hemorrhage 07/03/2016  . Esophageal reflux  . Essential hypertension, benign  . Fissure, anal 08/23/2017  . H. pylori infection  . History of abnormal cervical Pap smear 2021  . History of Barrett's esophagus 08/23/2017  . Lateral epicondylitis of right elbow 11/10/2015  . Lumbar disc disease  . Neuropathy  . Osteoarthritis of both knees  . Peptic ulcer  History of  . Pure hypercholesterolemia  . Sleep apnea  with previous pharyngeal surgery  . Type II or unspecified type diabetes mellitus without mention of  complication, uncontrolled  with diabetic peripheral neuropathy   Past Surgical History She has a past surgical history that  includes Tubal ligation; laparoscopy diagnostic (1981); Hernia repair; Knee arthroscopy (Right, 11/2005); Resection Lateral Pharyngeal Wall W/Local Advancement Flap; Bunionectomy; UPPP for sleep apnea (1999); Lumpectomy for breast cancer; Colonoscopy (10/03/1992); Colonoscopy (03/19/1997); Sigmoidoscopy Flexible (04/08/2000); Sigmoidoscopy Flexible (01/22/2007); egd (07/07/1990); egd (03/19/1997); egd (11/01/2000); egd (01/09/2007); egd (03/30/2011); Colonoscopy (04/12/2014); egd (04/12/2014); Arthroscopic subacronial decompression plus release of the long head biceps tendon followed by arthroscopic rotator cuff repair (Right, 6.1.16); Polypectomy; Cholecystectomy; Gastric fundoplication; Sigmoidoscopy; Dilation and curettage, diagnostic / therapeutic; Blepharoplasty; Mastectomy; Tonsillectomy (?); Colon surgery; Colonoscopy; Upper gastrointestinal endoscopy; and Hysterectomy (1985).   Medications and Allergies  Current Medications  Current Outpatient Medications  Medication Sig Dispense Refill  . aspirin 81 MG chewable tablet Take 1 tablet by mouth once daily  . fexofenadine (ALLEGRA) 180 MG tablet Take 180 mg by mouth once daily  . FUROsemide (LASIX) 20 MG tablet Take 1 tablet (20 mg total) by mouth once daily 30 tablet 11  . glipiZIDE (GLUCOTROL) 10 MG tablet TAKE 1 TABLET BY MOUTH TWICE DAILY BEFORE MEAL(S) 180 tablet 3  . insulin NPH-REGULAR (HUMULIN 70/30) 100 unit/mL (70-30) injection Inject 25 units before breakfast and 30 units before supper 20 mL 0  . losartan (COZAAR) 50 MG tablet TAKE 1 TABLET BY MOUTH ONCE DAILY - STOP BENAZEPRIL 90 tablet 1  . miscellaneous medical supply Misc AutoPAP 5-15 cm H20 1 each 0  . nitroGLYcerin (NITROSTAT) 0.4 MG SL tablet Place 0.4 mg under the tongue every 5 (five) minutes as needed for Chest pain May take up to 3 doses.  Glory Rosebush VERIO FLEX START kit Use to check blood sugars daily 1 each 0  . ONETOUCH VERIO TEST STRIPS test strip USE 1 STRIP TO CHECK  GLUCOSE THREE TIMES DAILY 300 each 0  . PARoxetine (PAXIL) 20 MG tablet Take 1 tablet (20 mg total) by mouth once daily 30 tablet 11  . rivastigmine tartrate (EXELON) 1.5 MG capsule Take 1 capsule (1.5 mg total) by mouth 2 (two) times daily with meals 60 capsule 11  . tiZANidine (ZANAFLEX) 2 MG tablet Take 1 tablet (2 mg total) by mouth nightly as needed 30 tablet 1  . traMADoL (ULTRAM) 50 mg tablet TAKE 1 TABLET BY MOUTH EVERY 6 HOURS AS NEEDED 100 tablet 0  . valACYclovir (VALTREX) 500 MG tablet TAKE 1 TABLET BY MOUTH TWICE DAILY FOR 3 DAYS AS NEEDED FOR FLARE 12 tablet 5  . isosorbide mononitrate (IMDUR) 30 MG ER tablet Take 1 tablet (30 mg total) by mouth once daily 30 tablet 11  . metoprolol succinate (TOPROL-XL) 50 MG XL tablet Take 1.5 tablets (75 mg total) by mouth once daily 45 tablet 11   No current facility-administered medications for this visit.   Allergies: Metformin, Amoxicillin, Benazepril, Bupropion, Byetta [exenatide], Crestor [rosuvastatin], Gabapentin, Moxifloxacin, Pantoprazole, Simvastatin, Statins-hmg-coa reductase inhibitors, Sulfa (sulfonamide antibiotics), Sulfasalazine, and Cefaclor  Social and Family History  Social History reports that she quit smoking about 21 years ago. Her smoking use included cigarettes. She started smoking about 59 years ago. She has a 20.00 pack-year smoking history. She has never used smokeless tobacco. She reports previous alcohol use. She reports that she does not use drugs.  Family History Family History  Problem Relation Age of Onset  . Emphysema Mother  . Colon cancer Mother 76  . Cancer Mother  died of ca of colon  . Alcohol abuse Mother  .  Anxiety Mother  . Depression Mother  . Obesity Mother  . Colon polyps Sister  . Colon cancer Maternal Aunt  . Colon cancer Maternal Aunt  . High blood pressure (Hypertension) Son  . Back Pain Son  . Thyroid disease Daughter  . Colon cancer Paternal Aunt  . Stroke Paternal Aunt  . Colon  polyps Maternal Aunt  . Asthma Maternal Grandmother  . Diabetes Maternal Grandmother  . High blood pressure (Hypertension) Maternal Grandmother  . Osteoarthritis Maternal Grandmother  . Diabetes type II Maternal Grandmother  . Glaucoma Maternal Grandmother  . Obesity Maternal Grandmother  . Myasthenia gravis Daughter  . Colon cancer Daughter  . Skin cancer Daughter  . Thyroid disease Daughter  . Hyperlipidemia (Elevated cholesterol) Daughter  . High blood pressure (Hypertension) Daughter  . Obesity Daughter  . Colon cancer Paternal Grandfather   Review of Systems   Review of Systems: The patient reports chest pain, with associated shortness of breath, without orthopnea, with an episode of paroxysmal nocturnal dyspnea, with recent pedal edema, without palpitations, heart racing, presyncope, syncope, with fatigue, with exercise intolerance. Review of 10 Systems is negative except as described above.  Physical Examination   Vitals:BP 140/72  Pulse 87  Ht 162.6 cm ($RemoveB'5\' 4"'gieFzJbD$ )  Wt 99.8 kg (220 lb)  SpO2 95%  BMI 37.76 kg/m  Ht:162.6 cm ($RemoveBefo'5\' 4"'JrJhJUFojjF$ ) Wt:99.8 kg (220 lb) BHA:LPFX surface area is 2.12 meters squared. Body mass index is 37.76 kg/m.  General: Alert and oriented. Well-appearing. No acute distress. HEENT: Pupils equally reactive to light and accomodation  Neck: Supple, no JVD Lungs: Normal effort of breathing; clear to auscultation bilaterally; no wheezes, rales, rhonchi Heart: Regular rate and rhythm. No murmur, rub, or gallop Abdomen: nondistended Extremities: no cyanosis, clubbing, or edema Peripheral Pulses: 2+ radial Skin: Warm, dry, no diaphoresis  Assessment   74 y.o. female with  1. Essential hypertension, benign  2. Chronic diastolic congestive heart failure (CMS-HCC)  3. Coronary artery disease of native artery of native heart with stable angina pectoris (CMS-HCC)  4. Type 2 diabetes mellitus with vascular disease (CMS-HCC)  5. Sleep apnea, unspecified type   6. Chronic obstructive pulmonary disease, unspecified COPD type (CMS-HCC)  7. Obstructive sleep apnea  8. Controlled type 2 diabetes mellitus with complication, with long-term current use of insulin (CMS-HCC)  9. S/P drug eluting coronary stent placement  10. SOB (shortness of breath) on exertion  11. Pure hypercholesterolemia  12. Statin intolerance   74 year old female with multivessel coronary artery disease, status post multiple stents, most recently in 09/2018 at [facility], with 52-month history of progressive exertional chest discomfort and shortness of breath similar to symptoms she experienced prior to recent PCI. Recent Lexiscan Myoview revealed normal left ventricular function, with lateral wall ischemia. Repeat cardiac catheterization 02/25/2019 revealed patent stents proximal mid LAD and mid left circumflex. The patient then used to experience occasional episodes of chest pain with moderate levels of exertion. The patient has essential hypertension, systolic blood pressure mildly elevated today on current BP medications. The patient complains of easy bruising.  Plan   1. DC Plavix 2. Counseled patient about low-sodium diet 3. DASH diet printed instructions given to the patient 4. Counseled patient about low-cholesterol diet 5. Continue rosuvastatin 20 mg daily for hyperlipidemia management 6. Low-fat and cholesterol diet printed instructions given to the patient 7. Diabetes diet printed instructions given to the patient 8. Continue low-dose aspirin 9. Uptitrate metoprolol succinate 75 mg daily 10. Advised patient to exercise more regularly 11.  Advised patient to lose weight 12. Add isosorbide mononitrate 30 mg daily 13. Return to clinic for follow-up in 2 months  No orders of the defined types were placed in this encounter.  Return in about 2 months (around 02/21/2020).  Tammy Cowman, MD PhD [facility]   Electronically signed by Tammy Cowman, MD at 12/22/2019 11:03 AM  EDT  Plan of Treatment - documented as of this encounter Upcoming Encounters Upcoming Encounters  Date Type Specialty Care Team Description  01/23/2020 Procedure visit Neurology Ray Church, [place]  [facility]-Neurology  [place], [place] [address]  [phone]  [phone] ([phone]    01/26/2020 Office Visit Endocrinology Solum, Felipa Evener, MD  [place]  [facility] [place], [place] [address]  [phone]  [phone] (Fax)    02/16/2020 Procedure visit Physical Medicine and Rehabilitation [place], [place]  [place], [place] [address]  [phone]  [phone] (Fax)    02/18/2020 Office Visit Gastroenterology Kathline Magic, [place]  [place], [place] [address]  [phone]  [phone] (Fax)    Ok Edwards, NP  [place]  [facility] [place], [place] [address]  [phone]  [phone] ([phone]    02/22/2020 Office Visit Cardiology Tammy Cowman, MD  [address]  [facility]Cardiology  [place], [place] [address]  [phone]  [phone] (Fax)    03/21/2020 Procedure visit Physical Medicine and Rehabilitation [place], [place]  [place], [place] [address]  [phone]  [phone] (Fax)    04/11/2020 Office Visit Physical Medicine and Rehabilitation [place], [place]  [place], [place] [address]  [phone]  [phone] (Fax)    [place], [place], [place]  [place], [place] [address]  [phone]  [phone] (Fax)    05/24/2020 Office Visit Neurology Ray Church, [place]  [facility]  [place], [place] 11/21/2021  [phone]  [phone] ([address]    [place], [place], [place] [address]  [phone]  [phone] (Fax)    Goals - documented as of this encounter Goal Patient Goal Type Associated Problems Recent Progress Patient-Stated? Author  Lose Weight  Weight  On track (07/08/2018 9:37 AM EDT) Yes Eilleen Kempf, CMA  Note:   Formatting of this note might be different from the original. Pt has set a weight loss goal. She would like to lose 20#'s within the next year. Pt plans to achieve this gaol by increasing exercise. She would like to work out at least 3 days per week. She will also work on creating a healthy meal plan.   Lose Weight  Weight   Yes [place], Caryl Pina, CMA  Visit Diagnoses - documented in this encounter Diagnosis  Essential hypertension, benign - Primary   Chronic diastolic congestive heart failure (CMS-HCC)   Coronary artery disease of native artery of native heart with stable angina pectoris (CMS-HCC)   Type 2 diabetes mellitus with vascular disease (CMS-HCC)   Sleep apnea, unspecified type   Chronic obstructive pulmonary disease, unspecified COPD type (CMS-HCC)   Obstructive sleep apnea  Obstructive sleep apnea (adult) (pediatric)   Controlled type 2 diabetes mellitus with complication, with long-term current use of insulin (CMS-HCC)   S/P drug eluting coronary stent placement   SOB (shortness of breath) on exertion  Shortness of breath   Pure hypercholesterolemia  Statin intolerance   Discontinued Medications - documented as of this encounter Medication Sig Discontinue Reason Start Date End Date  lancets (ONETOUCH DELICA LANCETS) 30 gauge Misc  Use 1 each 3 (three) times a day Alternate therapy 12/30/2017 12/22/2019  clopidogrel (PLAVIX) 75 mg tablet  Take 75 mg by mouth once daily No longer indicated  12/22/2019  Images Patient Demographics  Patient Address Communication Language Race / Ethnicity Marital Status  [address] [place], [place] [address] [phone]  (Mobile) [phone] (Home) [email or web address] English (Preferred) Dema Severin / Not Hispanic or Latino Widowed  Patient Contacts  Contact Name Contact Address Communication Relationship to Patient  Hiilei Gerst [address] [place], [place] [address] [phone] (Home) Son or Daughter, Emergency Contact  Document Information  Primary Care Provider Other Service Providers Document Coverage Dates  Velna Ochs, MD (Jul. 22, 2019July 22, 2019 - Present) [phone] (Work) [phone] (Fax) [place], [place] [address] Internal Medicine   Kingsley Callander, RN (Registered Nurse)  [facility] [address] [place], [place] [address] Apr. 13, 2021April 13, 2021   [place] [address] [place], [place] [address]   Encounter Providers Encounter Date  Tammy Cowman, MD (Attending) [phone] (Work) [phone] (Fax) [place] [facility] [place],  [address] Cardiovascular Disease Apr. 13, 2021April 13, 2021    Show All Sections

## 2020-07-13 NOTE — Patient Instructions (Addendum)
Your procedure is scheduled on: 07-21-20 THURSDAY Report to Day Surgery on the 2nd floor of the [facility]. To find out your arrival time, please call [phone] between 1PM - 3PM on:07-20-20 WEDNESDAY  REMEMBER: Instructions that are not followed completely may result in serious medical risk, up to and including death; or upon the discretion of your surgeon and anesthesiologist your surgery may need to be rescheduled.  Do not eat food after midnight the night before surgery.  No gum chewing, lozengers or hard candies.  You may however, drink WATER up to 2 hours before you are scheduled to arrive for your surgery. Do not drink anything within 2 hours of your scheduled arrival time.  Type 1 and Type 2 diabetics should only drink water.  TAKE THESE MEDICATIONS THE MORNING OF SURGERY WITH A SIP OF WATER: -METOPROLOL (TOPROL) -IMDUR (ISOSORBIDE) -PAXIL (PAROXETINE) -PROTONIX (PANTOPRAZOLE)-take one the night before and one on the morning of surgery - helps to prevent nausea after surgery.)  Take 1/2 of usual insulin dose the night before surgery and none on the morning of surgery-TAKE HALF OF YOUR TRESIBA THE NIGHT BEFORE YOUR SURGERY (33 UNITS) AND NO INSULIN THE MORNING OF SURGERY  Follow recommendations from Cardiologist, Pulmonologist or PCP regarding stopping Aspirin, Coumadin, Plavix, Eliquis, Pradaxa, or Pletal-ASPIRIN WAS STOPPED LAST WEEK  One week prior to surgery: Stop Anti-inflammatories (NSAIDS) such as Advil, Aleve, Ibuprofen, Motrin, Naproxen, Naprosyn and Aspirin based products such as Excedrin, Goodys Powder, BC Powder-OK TO TAKE TYLENOL/TRAMADOL IF NEEDED  Stop ANY OVER THE COUNTER supplements until after surgery.  No Alcohol for 24 hours before or after surgery.  No Smoking including e-cigarettes for 24 hours prior to surgery.  No chewable tobacco products for at least 6 hours prior to surgery.  No nicotine patches on the day of surgery.  Do not use any  "recreational" drugs for at least a week prior to your surgery.  Please be advised that the combination of cocaine and anesthesia may have negative outcomes, up to and including death. If you test positive for cocaine, your surgery will be cancelled.  On the morning of surgery brush your teeth with toothpaste and water, you may rinse your mouth with mouthwash if you wish. Do not swallow any toothpaste or mouthwash.  Do not wear jewelry, make-up, hairpins, clips or nail polish.  Do not wear lotions, powders, or perfumes.   Do not shave 48 hours prior to surgery.   Contact lenses, hearing aids and dentures may not be worn into surgery.  Do not bring valuables to the hospital. [facility] is not responsible for any missing/lost belongings or valuables.   Use CHG Soap as directed on instruction sheet.  Notify your doctor if there is any change in your medical condition (cold, fever, infection).  Wear comfortable clothing (specific to your surgery type) to the hospital.  Plan for stool softeners for home use; pain medications have a tendency to cause constipation. You can also help prevent constipation by eating foods high in fiber such as fruits and vegetables and drinking plenty of fluids as your diet allows.  After surgery, you can help prevent lung complications by doing breathing exercises.  Take deep breaths and cough every 1-2 hours. Your doctor may order a device called an Incentive Spirometer to help you take deep breaths. When coughing or sneezing, hold a pillow firmly against your incision with both hands. This is called "splinting." Doing this helps protect your incision. It also decreases belly discomfort.  If you are being admitted to the hospital overnight, leave your suitcase in the car. After surgery it may be brought to your room.  If you are being discharged the day of surgery, you will not be allowed to drive home. You will need a responsible adult (18 years or older)  to drive you home and stay with you that night.   If you are taking public transportation, you will need to have a responsible adult (18 years or older) with you. Please confirm with your physician that it is acceptable to use public transportation.   Please call the [facility] at [phone] if you have any questions about these instructions.  Visitation Policy:  Patients undergoing a surgery or procedure may have one family member or support person with them as long as that person is not COVID-19 positive or experiencing its symptoms.  That person may remain in the waiting area during the procedure.  Inpatient Visitation Update:   In an effort to ensure the safety of our team members and our patients, we are implementing a change to our visitation policy:  Effective Monday, Aug. 9, at 7 a.m., inpatients will be allowed one support person.  o The support person may change daily.  o The support person must pass our screening, gel in and out, and wear a mask at all times, including in the patient's room.  o Patients must also wear a mask when staff or their support person are in the room.  o Masking is required regardless of vaccination status.  Systemwide, no visitors 17 or younger.

## 2020-07-14 NOTE — Progress Notes (Signed)
  [facility] Perioperative Services: Pre-Admission/Anesthesia Testing     Date: 07/14/20  Name: Tammy Hatfield MRN:   [id number]  Re: Change in ABX for upcoming surgery   Case: [id number] Date/Time: 07/21/20 0715   Procedure: TOTAL KNEE ARTHROPLASTY (Left Knee)   Anesthesia type: Choice   Pre-op diagnosis: Primary osteoarthritisi of left knee.   Location: [facility] OR ROOM 03 / [place] ORS FOR ANESTHESIA GROUP   Surgeons: Corky Mull, MD    Primary attending surgeon was consulted regarding consideration of therapeutic change in antimicrobial agent being used for preoperative prophylaxis in this patient's upcoming surgical case. Following analysis of the risk versus benefits, Dr. Roland Rack, Marshall Cork, MD advising that it would be acceptable to discontinue the ordered clindamycin and place an order for cefazolin 2 gm IV on call to the OR. Orders for this patient were amended by me following collaborative conversation with attending surgeon.  Honor Loh, MSN, APRN, FNP-C, CEN [facility]  Peri-operative Services Nurse Practitioner Phone: [phone] 07/14/20 3:20 PM

## 2020-07-19 ENCOUNTER — Other Ambulatory Visit
Admission: RE | Admit: 2020-07-19 | Discharge: 2020-07-19 | Disposition: A | Discharge status: Home / Self Care | Payer: Medicare HMO | Source: Ambulatory Visit | Attending: Surgery | Admitting: Surgery

## 2020-07-19 ENCOUNTER — Other Ambulatory Visit: Payer: Self-pay

## 2020-07-19 DIAGNOSIS — Z01812 Encounter for preprocedural laboratory examination: Secondary | ICD-10-CM | POA: Insufficient documentation

## 2020-07-19 DIAGNOSIS — Z20822 Contact with and (suspected) exposure to covid-19: Secondary | ICD-10-CM | POA: Insufficient documentation

## 2020-07-20 LAB — SARS CORONAVIRUS 2 (TAT 6-24 HRS): SARS Coronavirus 2: NEGATIVE

## 2020-07-21 ENCOUNTER — Inpatient Hospital Stay: Payer: Medicare HMO

## 2020-07-21 ENCOUNTER — Inpatient Hospital Stay: Payer: Medicare HMO | Admitting: Urgent Care

## 2020-07-21 ENCOUNTER — Encounter
Admission: RE | Disposition: A | Discharge status: Skilled Nursing Facility | Payer: Self-pay | Source: Home / Self Care | Attending: Surgery

## 2020-07-21 ENCOUNTER — Encounter: Payer: Self-pay | Admitting: Surgery

## 2020-07-21 ENCOUNTER — Inpatient Hospital Stay
Admission: RE | Admit: 2020-07-21 | Discharge: 2020-07-26 | DRG: 470 | Disposition: A | Discharge status: Skilled Nursing Facility | Payer: Medicare HMO | Attending: Surgery | Admitting: Surgery

## 2020-07-21 ENCOUNTER — Other Ambulatory Visit: Payer: Self-pay

## 2020-07-21 DIAGNOSIS — Z794 Long term (current) use of insulin: Secondary | ICD-10-CM

## 2020-07-21 DIAGNOSIS — Z7982 Long term (current) use of aspirin: Secondary | ICD-10-CM | POA: Diagnosis not present

## 2020-07-21 DIAGNOSIS — E785 Hyperlipidemia, unspecified: Secondary | ICD-10-CM | POA: Diagnosis present

## 2020-07-21 DIAGNOSIS — K5730 Diverticulosis of large intestine without perforation or abscess without bleeding: Secondary | ICD-10-CM | POA: Diagnosis present

## 2020-07-21 DIAGNOSIS — Z853 Personal history of malignant neoplasm of breast: Secondary | ICD-10-CM | POA: Diagnosis not present

## 2020-07-21 DIAGNOSIS — Z8619 Personal history of other infectious and parasitic diseases: Secondary | ICD-10-CM | POA: Diagnosis not present

## 2020-07-21 DIAGNOSIS — Z823 Family history of stroke: Secondary | ICD-10-CM

## 2020-07-21 DIAGNOSIS — I2510 Atherosclerotic heart disease of native coronary artery without angina pectoris: Secondary | ICD-10-CM | POA: Diagnosis present

## 2020-07-21 DIAGNOSIS — E7800 Pure hypercholesterolemia, unspecified: Secondary | ICD-10-CM | POA: Diagnosis present

## 2020-07-21 DIAGNOSIS — Z811 Family history of alcohol abuse and dependence: Secondary | ICD-10-CM

## 2020-07-21 DIAGNOSIS — I130 Hypertensive heart and chronic kidney disease with heart failure and stage 1 through stage 4 chronic kidney disease, or unspecified chronic kidney disease: Secondary | ICD-10-CM | POA: Diagnosis present

## 2020-07-21 DIAGNOSIS — I700 Atherosclerosis of aorta: Secondary | ICD-10-CM | POA: Diagnosis present

## 2020-07-21 DIAGNOSIS — Z923 Personal history of irradiation: Secondary | ICD-10-CM | POA: Diagnosis not present

## 2020-07-21 DIAGNOSIS — Z87891 Personal history of nicotine dependence: Secondary | ICD-10-CM | POA: Diagnosis not present

## 2020-07-21 DIAGNOSIS — K219 Gastro-esophageal reflux disease without esophagitis: Secondary | ICD-10-CM | POA: Diagnosis present

## 2020-07-21 DIAGNOSIS — Z8711 Personal history of peptic ulcer disease: Secondary | ICD-10-CM | POA: Diagnosis not present

## 2020-07-21 DIAGNOSIS — E1122 Type 2 diabetes mellitus with diabetic chronic kidney disease: Secondary | ICD-10-CM | POA: Diagnosis present

## 2020-07-21 DIAGNOSIS — Z20822 Contact with and (suspected) exposure to covid-19: Secondary | ICD-10-CM | POA: Diagnosis present

## 2020-07-21 DIAGNOSIS — Z79899 Other long term (current) drug therapy: Secondary | ICD-10-CM | POA: Diagnosis not present

## 2020-07-21 DIAGNOSIS — G4730 Sleep apnea, unspecified: Secondary | ICD-10-CM | POA: Diagnosis present

## 2020-07-21 DIAGNOSIS — K760 Fatty (change of) liver, not elsewhere classified: Secondary | ICD-10-CM | POA: Diagnosis present

## 2020-07-21 DIAGNOSIS — I5032 Chronic diastolic (congestive) heart failure: Secondary | ICD-10-CM | POA: Diagnosis present

## 2020-07-21 DIAGNOSIS — Z8249 Family history of ischemic heart disease and other diseases of the circulatory system: Secondary | ICD-10-CM | POA: Diagnosis not present

## 2020-07-21 DIAGNOSIS — N189 Chronic kidney disease, unspecified: Secondary | ICD-10-CM | POA: Diagnosis present

## 2020-07-21 DIAGNOSIS — Z8371 Family history of colonic polyps: Secondary | ICD-10-CM

## 2020-07-21 DIAGNOSIS — Z96652 Presence of left artificial knee joint: Secondary | ICD-10-CM

## 2020-07-21 DIAGNOSIS — M1712 Unilateral primary osteoarthritis, left knee: Secondary | ICD-10-CM | POA: Diagnosis present

## 2020-07-21 DIAGNOSIS — Z833 Family history of diabetes mellitus: Secondary | ICD-10-CM

## 2020-07-21 DIAGNOSIS — F419 Anxiety disorder, unspecified: Secondary | ICD-10-CM | POA: Diagnosis present

## 2020-07-21 DIAGNOSIS — J329 Chronic sinusitis, unspecified: Secondary | ICD-10-CM | POA: Diagnosis present

## 2020-07-21 DIAGNOSIS — Z8719 Personal history of other diseases of the digestive system: Secondary | ICD-10-CM

## 2020-07-21 DIAGNOSIS — Z90710 Acquired absence of both cervix and uterus: Secondary | ICD-10-CM

## 2020-07-21 DIAGNOSIS — K2270 Barrett's esophagus without dysplasia: Secondary | ICD-10-CM | POA: Diagnosis present

## 2020-07-21 DIAGNOSIS — J449 Chronic obstructive pulmonary disease, unspecified: Secondary | ICD-10-CM | POA: Diagnosis present

## 2020-07-21 DIAGNOSIS — F32A Depression, unspecified: Secondary | ICD-10-CM | POA: Diagnosis present

## 2020-07-21 DIAGNOSIS — M545 Low back pain, unspecified: Secondary | ICD-10-CM

## 2020-07-21 DIAGNOSIS — Z818 Family history of other mental and behavioral disorders: Secondary | ICD-10-CM

## 2020-07-21 DIAGNOSIS — Z808 Family history of malignant neoplasm of other organs or systems: Secondary | ICD-10-CM

## 2020-07-21 DIAGNOSIS — Z800 Family history of malignant neoplasm of digestive organs: Secondary | ICD-10-CM

## 2020-07-21 DIAGNOSIS — Z9049 Acquired absence of other specified parts of digestive tract: Secondary | ICD-10-CM

## 2020-07-21 DIAGNOSIS — Z825 Family history of asthma and other chronic lower respiratory diseases: Secondary | ICD-10-CM

## 2020-07-21 HISTORY — PX: TOTAL KNEE ARTHROPLASTY: SHX125

## 2020-07-21 LAB — HEMOGLOBIN A1C
Hgb A1c MFr Bld: 6.9 % — ABNORMAL HIGH (ref 4.8–5.6)
Mean Plasma Glucose: 151.33 mg/dL

## 2020-07-21 LAB — ABO/RH: ABO/RH(D): A POS

## 2020-07-21 LAB — GLUCOSE, CAPILLARY
Glucose-Capillary: 175 mg/dL — ABNORMAL HIGH (ref 70–99)
Glucose-Capillary: 145 mg/dL — ABNORMAL HIGH (ref 70–99)
Glucose-Capillary: 194 mg/dL — ABNORMAL HIGH (ref 70–99)
Glucose-Capillary: 162 mg/dL — ABNORMAL HIGH (ref 70–99)

## 2020-07-21 SURGERY — ARTHROPLASTY, KNEE, TOTAL
Anesthesia: Choice | Site: Knee | Laterality: Left

## 2020-07-21 MED ORDER — TRANEXAMIC ACID 1000 MG/10ML IV SOLN
INTRAVENOUS | Status: DC | PRN
  Administered 2020-07-21: 1000 mg via TOPICAL

## 2020-07-21 MED ORDER — MAGNESIUM HYDROXIDE 400 MG/5ML PO SUSP
30.0000 mL | Freq: Every day | ORAL | Status: DC | PRN
  Administered 2020-07-24 – 2020-07-25 (×2): 30 mL via ORAL
  Filled 2020-07-21 (×3): qty 30

## 2020-07-21 MED ORDER — DIPHENHYDRAMINE HCL 12.5 MG/5ML PO ELIX
12.5000 mg | ORAL_SOLUTION | ORAL | Status: DC | PRN
  Filled 2020-07-21: qty 10

## 2020-07-21 MED ORDER — LOSARTAN POTASSIUM 50 MG PO TABS: 50.0000 mg | ORAL_TABLET | ORAL | Status: DC

## 2020-07-21 MED ORDER — FENTANYL CITRATE (PF) 100 MCG/2ML IJ SOLN
INTRAMUSCULAR | Status: AC
  Filled 2020-07-21: qty 2

## 2020-07-21 MED ORDER — FENTANYL CITRATE (PF) 100 MCG/2ML IJ SOLN
25.0000 ug | INTRAMUSCULAR | Status: DC | PRN
  Administered 2020-07-21 (×2): 25 ug via INTRAVENOUS
  Administered 2020-07-21: 50 ug via INTRAVENOUS

## 2020-07-21 MED ORDER — ISOSORBIDE MONONITRATE ER 30 MG PO TB24
30.0000 mg | ORAL_TABLET | ORAL | Status: DC
  Administered 2020-07-22 – 2020-07-26 (×5): 30 mg via ORAL
  Filled 2020-07-21 (×5): qty 1

## 2020-07-21 MED ORDER — HYDROMORPHONE HCL 1 MG/ML IJ SOLN
INTRAMUSCULAR | Status: AC
  Filled 2020-07-21: qty 1

## 2020-07-21 MED ORDER — OXYCODONE HCL 5 MG/5ML PO SOLN: 5.0000 mg | Freq: Once | ORAL | Status: AC | PRN

## 2020-07-21 MED ORDER — KETOROLAC TROMETHAMINE 15 MG/ML IJ SOLN: 15.0000 mg | Freq: Once | INTRAMUSCULAR | Status: AC

## 2020-07-21 MED ORDER — NITROGLYCERIN 0.4 MG SL SUBL: 0.4000 mg | SUBLINGUAL_TABLET | SUBLINGUAL | Status: DC | PRN

## 2020-07-21 MED ORDER — CARBOXYMETHYLCELLUL-GLYCERIN 0.5-0.9 % OP SOLN
1.0000 [drp] | Freq: Every day | OPHTHALMIC | Status: DC | PRN
  Filled 2020-07-21: qty 15

## 2020-07-21 MED ORDER — PROPOFOL 500 MG/50ML IV EMUL
INTRAVENOUS | Status: AC
  Filled 2020-07-21: qty 50

## 2020-07-21 MED ORDER — SODIUM CHLORIDE 0.9 % IV SOLN: INTRAVENOUS | Status: DC

## 2020-07-21 MED ORDER — VALACYCLOVIR HCL 500 MG PO TABS
500.0000 mg | ORAL_TABLET | Freq: Two times a day (BID) | ORAL | Status: DC | PRN
  Filled 2020-07-21: qty 1

## 2020-07-21 MED ORDER — METOCLOPRAMIDE HCL 10 MG PO TABS: 5.0000 mg | ORAL_TABLET | Freq: Three times a day (TID) | ORAL | Status: DC | PRN

## 2020-07-21 MED ORDER — ONDANSETRON HCL 4 MG/2ML IJ SOLN: 4.0000 mg | Freq: Four times a day (QID) | INTRAMUSCULAR | Status: DC | PRN

## 2020-07-21 MED ORDER — FLEET ENEMA 7-19 GM/118ML RE ENEM: 1.0000 | ENEMA | Freq: Once | RECTAL | Status: DC | PRN

## 2020-07-21 MED ORDER — CEFAZOLIN SODIUM-DEXTROSE 2-4 GM/100ML-% IV SOLN
INTRAVENOUS | Status: AC
  Filled 2020-07-21: qty 100

## 2020-07-21 MED ORDER — PANTOPRAZOLE SODIUM 20 MG PO TBEC
20.0000 mg | DELAYED_RELEASE_TABLET | ORAL | Status: DC
  Administered 2020-07-22 – 2020-07-26 (×5): 20 mg via ORAL
  Filled 2020-07-21 (×6): qty 1

## 2020-07-21 MED ORDER — SODIUM CHLORIDE 0.9 % IV SOLN
INTRAVENOUS | Status: DC | PRN
  Administered 2020-07-21: 60 mL

## 2020-07-21 MED ORDER — TRAMADOL HCL 50 MG PO TABS
50.0000 mg | ORAL_TABLET | Freq: Four times a day (QID) | ORAL | Status: DC | PRN
  Administered 2020-07-22 – 2020-07-23 (×2): 50 mg via ORAL
  Filled 2020-07-21 (×2): qty 1

## 2020-07-21 MED ORDER — SODIUM CHLORIDE 0.9 % IV SOLN
INTRAVENOUS | Status: DC | PRN
  Administered 2020-07-21: 50 ug/min via INTRAVENOUS

## 2020-07-21 MED ORDER — ONDANSETRON HCL 4 MG PO TABS: 4.0000 mg | ORAL_TABLET | Freq: Four times a day (QID) | ORAL | Status: DC | PRN

## 2020-07-21 MED ORDER — TRANEXAMIC ACID 1000 MG/10ML IV SOLN
INTRAVENOUS | Status: AC
  Filled 2020-07-21: qty 10

## 2020-07-21 MED ORDER — SODIUM CHLORIDE FLUSH 0.9 % IV SOLN
INTRAVENOUS | Status: AC
  Filled 2020-07-21: qty 40

## 2020-07-21 MED ORDER — ENOXAPARIN SODIUM 40 MG/0.4ML ~~LOC~~ SOLN
40.0000 mg | SUBCUTANEOUS | Status: DC
  Administered 2020-07-22 – 2020-07-26 (×5): 40 mg via SUBCUTANEOUS
  Filled 2020-07-21 (×5): qty 0.4

## 2020-07-21 MED ORDER — BUPIVACAINE-EPINEPHRINE (PF) 0.5% -1:200000 IJ SOLN
INTRAMUSCULAR | Status: DC | PRN
  Administered 2020-07-21: 30 mL

## 2020-07-21 MED ORDER — VITAMIN D3 25 MCG (1000 UNIT) PO TABS
1000.0000 [IU] | ORAL_TABLET | Freq: Every day | ORAL | Status: DC
  Administered 2020-07-22 – 2020-07-26 (×5): 1000 [IU] via ORAL
  Filled 2020-07-21 (×12): qty 1

## 2020-07-21 MED ORDER — ONDANSETRON HCL 4 MG/2ML IJ SOLN
INTRAMUSCULAR | Status: DC | PRN
  Administered 2020-07-21: 4 mg via INTRAVENOUS

## 2020-07-21 MED ORDER — CEFAZOLIN SODIUM-DEXTROSE 2-4 GM/100ML-% IV SOLN
2.0000 g | Freq: Four times a day (QID) | INTRAVENOUS | Status: AC
  Administered 2020-07-21 – 2020-07-22 (×3): 2 g via INTRAVENOUS
  Filled 2020-07-21 (×2): qty 100

## 2020-07-21 MED ORDER — PHENYLEPHRINE HCL (PRESSORS) 10 MG/ML IV SOLN
INTRAVENOUS | Status: AC
  Filled 2020-07-21: qty 1

## 2020-07-21 MED ORDER — LORATADINE 10 MG PO TABS
10.0000 mg | ORAL_TABLET | Freq: Every day | ORAL | Status: DC
  Administered 2020-07-22 – 2020-07-26 (×5): 10 mg via ORAL
  Filled 2020-07-21 (×5): qty 1

## 2020-07-21 MED ORDER — INSULIN DEGLUDEC 200 UNIT/ML ~~LOC~~ SOPN: 66.0000 [IU] | PEN_INJECTOR | Freq: Every day | SUBCUTANEOUS | Status: DC

## 2020-07-21 MED ORDER — KETOROLAC TROMETHAMINE 15 MG/ML IJ SOLN
INTRAMUSCULAR | Status: AC
  Administered 2020-07-21: 15 mg via INTRAVENOUS
  Filled 2020-07-21: qty 1

## 2020-07-21 MED ORDER — INSULIN ASPART 100 UNIT/ML ~~LOC~~ SOLN
0.0000 [IU] | Freq: Three times a day (TID) | SUBCUTANEOUS | Status: DC
  Administered 2020-07-21 – 2020-07-23 (×4): 4 [IU] via SUBCUTANEOUS
  Administered 2020-07-23: 7 [IU] via SUBCUTANEOUS
  Administered 2020-07-23: 4 [IU] via SUBCUTANEOUS
  Administered 2020-07-24 (×2): 7 [IU] via SUBCUTANEOUS
  Administered 2020-07-24: 4 [IU] via SUBCUTANEOUS
  Administered 2020-07-25: 15 [IU] via SUBCUTANEOUS
  Administered 2020-07-25: 7 [IU] via SUBCUTANEOUS
  Administered 2020-07-25 – 2020-07-26 (×2): 4 [IU] via SUBCUTANEOUS
  Administered 2020-07-26: 7 [IU] via SUBCUTANEOUS
  Filled 2020-07-21 (×13): qty 1

## 2020-07-21 MED ORDER — SEMAGLUTIDE(0.25 OR 0.5MG/DOS) 2 MG/1.5ML ~~LOC~~ SOPN: 1.0000 mg | PEN_INJECTOR | SUBCUTANEOUS | Status: DC

## 2020-07-21 MED ORDER — METOPROLOL SUCCINATE ER 25 MG PO TB24: 50.0000 mg | ORAL_TABLET | ORAL | Status: DC

## 2020-07-21 MED ORDER — DEXAMETHASONE SODIUM PHOSPHATE 4 MG/ML IJ SOLN
INTRAMUSCULAR | Status: DC | PRN
  Administered 2020-07-21: 4 mg via INTRAVENOUS

## 2020-07-21 MED ORDER — INSULIN DEGLUDEC 100 UNIT/ML ~~LOC~~ SOPN: 33.0000 [IU] | PEN_INJECTOR | Freq: Every day | SUBCUTANEOUS | Status: DC

## 2020-07-21 MED ORDER — ACETAMINOPHEN 10 MG/ML IV SOLN
INTRAVENOUS | Status: DC | PRN
  Administered 2020-07-21: 1000 mg via INTRAVENOUS

## 2020-07-21 MED ORDER — INSULIN GLARGINE 100 UNIT/ML ~~LOC~~ SOLN
33.0000 [IU] | Freq: Every day | SUBCUTANEOUS | Status: AC
  Administered 2020-07-21: 33 [IU] via SUBCUTANEOUS
  Filled 2020-07-21: qty 0.33

## 2020-07-21 MED ORDER — CALCIUM CARBONATE ANTACID 500 MG PO CHEW
1500.0000 mg | CHEWABLE_TABLET | Freq: Every day | ORAL | Status: DC | PRN
  Filled 2020-07-21: qty 3

## 2020-07-21 MED ORDER — OXYCODONE HCL 5 MG PO TABS
5.0000 mg | ORAL_TABLET | Freq: Once | ORAL | Status: AC | PRN
  Administered 2020-07-21: 5 mg via ORAL

## 2020-07-21 MED ORDER — OXYCODONE HCL 5 MG PO TABS
ORAL_TABLET | ORAL | Status: AC
  Filled 2020-07-21: qty 1

## 2020-07-21 MED ORDER — KETOROLAC TROMETHAMINE 15 MG/ML IJ SOLN
7.5000 mg | Freq: Four times a day (QID) | INTRAMUSCULAR | Status: AC
  Administered 2020-07-21 – 2020-07-22 (×3): 7.5 mg via INTRAVENOUS
  Filled 2020-07-21 (×4): qty 1

## 2020-07-21 MED ORDER — FENTANYL CITRATE (PF) 100 MCG/2ML IJ SOLN
INTRAMUSCULAR | Status: DC | PRN
  Administered 2020-07-21: 50 ug via INTRAVENOUS

## 2020-07-21 MED ORDER — BUPIVACAINE HCL (PF) 0.5 % IJ SOLN
INTRAMUSCULAR | Status: AC
  Filled 2020-07-21: qty 10

## 2020-07-21 MED ORDER — METOCLOPRAMIDE HCL 5 MG/ML IJ SOLN: 5.0000 mg | Freq: Three times a day (TID) | INTRAMUSCULAR | Status: DC | PRN

## 2020-07-21 MED ORDER — INSULIN GLARGINE 100 UNIT/ML ~~LOC~~ SOLN
66.0000 [IU] | Freq: Every day | SUBCUTANEOUS | Status: DC
  Administered 2020-07-22 – 2020-07-25 (×4): 66 [IU] via SUBCUTANEOUS
  Filled 2020-07-21 (×6): qty 0.66

## 2020-07-21 MED ORDER — ACETAMINOPHEN 10 MG/ML IV SOLN
INTRAVENOUS | Status: AC
  Filled 2020-07-21: qty 100

## 2020-07-21 MED ORDER — OXYCODONE HCL 5 MG PO TABS
5.0000 mg | ORAL_TABLET | ORAL | Status: DC | PRN
  Administered 2020-07-21: 5 mg via ORAL
  Administered 2020-07-22: 10 mg via ORAL
  Filled 2020-07-21: qty 2
  Filled 2020-07-21: qty 1

## 2020-07-21 MED ORDER — HYDROMORPHONE HCL 1 MG/ML IJ SOLN
0.2500 mg | INTRAMUSCULAR | Status: DC | PRN
  Administered 2020-07-22 – 2020-07-23 (×3): 0.5 mg via INTRAVENOUS
  Filled 2020-07-21 (×3): qty 1

## 2020-07-21 MED ORDER — ONDANSETRON HCL 4 MG/2ML IJ SOLN: 4.0000 mg | Freq: Once | INTRAMUSCULAR | Status: DC | PRN

## 2020-07-21 MED ORDER — PROPOFOL 500 MG/50ML IV EMUL
INTRAVENOUS | Status: DC | PRN
  Administered 2020-07-21: 50 ug/kg/min via INTRAVENOUS

## 2020-07-21 MED ORDER — BUPIVACAINE LIPOSOME 1.3 % IJ SUSP
INTRAMUSCULAR | Status: AC
  Filled 2020-07-21: qty 20

## 2020-07-21 MED ORDER — DOCUSATE SODIUM 100 MG PO CAPS
100.0000 mg | ORAL_CAPSULE | Freq: Two times a day (BID) | ORAL | Status: DC
  Administered 2020-07-21 – 2020-07-26 (×10): 100 mg via ORAL
  Filled 2020-07-21 (×10): qty 1

## 2020-07-21 MED ORDER — HYDROMORPHONE HCL 1 MG/ML IJ SOLN
0.5000 mg | INTRAMUSCULAR | Status: DC | PRN
  Administered 2020-07-21 (×3): 0.5 mg via INTRAVENOUS

## 2020-07-21 MED ORDER — CHLORHEXIDINE GLUCONATE CLOTH 2 % EX PADS
6.0000 | MEDICATED_PAD | Freq: Every day | CUTANEOUS | Status: DC
  Administered 2020-07-21: 6 via TOPICAL

## 2020-07-21 MED ORDER — ACETAMINOPHEN 500 MG PO TABS
1000.0000 mg | ORAL_TABLET | Freq: Four times a day (QID) | ORAL | Status: AC
  Administered 2020-07-21 – 2020-07-22 (×3): 1000 mg via ORAL
  Filled 2020-07-21 (×3): qty 2

## 2020-07-21 MED ORDER — CEFAZOLIN SODIUM-DEXTROSE 2-4 GM/100ML-% IV SOLN
2.0000 g | Freq: Once | INTRAVENOUS | Status: AC
  Administered 2020-07-21: 2 g via INTRAVENOUS

## 2020-07-21 MED ORDER — CHLORHEXIDINE GLUCONATE 0.12 % MT SOLN
OROMUCOSAL | Status: AC
  Administered 2020-07-21: 15 mL via OROMUCOSAL
  Filled 2020-07-21: qty 15

## 2020-07-21 MED ORDER — VITAMIN B-12 1000 MCG PO TABS
1000.0000 ug | ORAL_TABLET | Freq: Every day | ORAL | Status: DC
  Administered 2020-07-22 – 2020-07-26 (×4): 1000 ug via ORAL
  Filled 2020-07-21 (×5): qty 1

## 2020-07-21 MED ORDER — BUPIVACAINE HCL (PF) 0.5 % IJ SOLN
INTRAMUSCULAR | Status: DC | PRN
  Administered 2020-07-21: 2.5 mL

## 2020-07-21 MED ORDER — ASPIRIN EC 81 MG PO TBEC
81.0000 mg | DELAYED_RELEASE_TABLET | Freq: Every day | ORAL | Status: DC
  Administered 2020-07-22 – 2020-07-26 (×5): 81 mg via ORAL
  Filled 2020-07-21 (×5): qty 1

## 2020-07-21 MED ORDER — ONDANSETRON HCL 4 MG/2ML IJ SOLN
INTRAMUSCULAR | Status: AC
  Filled 2020-07-21: qty 2

## 2020-07-21 MED ORDER — ACETAMINOPHEN 10 MG/ML IV SOLN: 1000.0000 mg | Freq: Once | INTRAVENOUS | Status: DC | PRN

## 2020-07-21 MED ORDER — PAROXETINE HCL 20 MG PO TABS
20.0000 mg | ORAL_TABLET | ORAL | Status: DC
  Administered 2020-07-22 – 2020-07-26 (×5): 20 mg via ORAL
  Filled 2020-07-21 (×5): qty 1

## 2020-07-21 MED ORDER — ORAL CARE MOUTH RINSE: 15.0000 mL | Freq: Once | OROMUCOSAL | Status: AC

## 2020-07-21 MED ORDER — BISACODYL 10 MG RE SUPP
10.0000 mg | Freq: Every day | RECTAL | Status: DC | PRN
  Administered 2020-07-26: 10 mg via RECTAL
  Filled 2020-07-21 (×2): qty 1

## 2020-07-21 MED ORDER — CHLORHEXIDINE GLUCONATE 0.12 % MT SOLN: 15.0000 mL | Freq: Once | OROMUCOSAL | Status: AC

## 2020-07-21 MED ORDER — BUPIVACAINE-EPINEPHRINE (PF) 0.5% -1:200000 IJ SOLN
INTRAMUSCULAR | Status: AC
  Filled 2020-07-21: qty 30

## 2020-07-21 MED ORDER — ACETAMINOPHEN 325 MG PO TABS
325.0000 mg | ORAL_TABLET | Freq: Four times a day (QID) | ORAL | Status: DC | PRN
  Administered 2020-07-23 – 2020-07-24 (×2): 325 mg via ORAL
  Filled 2020-07-21 (×2): qty 1

## 2020-07-21 SURGICAL SUPPLY — 66 items
APL PRP STRL LF DISP 70% ISPRP (MISCELLANEOUS) ×1
BLADE SAW SAG 25X90X1.19 (BLADE) ×3 IMPLANT
BLADE SURG SZ20 CARB STEEL (BLADE) ×3 IMPLANT
BNDG CMPR STD VLCR NS LF 5.8X6 (GAUZE/BANDAGES/DRESSINGS) ×1
BNDG ELASTIC 6X5.8 VLCR NS LF (GAUZE/BANDAGES/DRESSINGS) ×3 IMPLANT
BONE VNGD MED NAIL (MISCELLANEOUS) ×3 IMPLANT
BRNG TIB 71X10 ANT STAB MDLR (Insert) ×1 IMPLANT
CANISTER SUCT 1200ML W/VALVE (MISCELLANEOUS) ×3 IMPLANT
CANISTER SUCT 3000ML PPV (MISCELLANEOUS) ×3 IMPLANT
CEMENT BONE R 1X40 (Cement) ×6 IMPLANT
CEMENT VACUUM MIXING SYSTEM (MISCELLANEOUS) ×3 IMPLANT
CHLORAPREP W/TINT 26 (MISCELLANEOUS) ×3 IMPLANT
COOLER POLAR GLACIER W/PUMP (MISCELLANEOUS) ×3 IMPLANT
COVER MAYO STAND REUSABLE (DRAPES) ×3 IMPLANT
COVER WAND RF STERILE (DRAPES) ×3 IMPLANT
CUFF TOURN SGL QUICK 24 (TOURNIQUET CUFF)
CUFF TOURN SGL QUICK 30 (TOURNIQUET CUFF)
CUFF TOURN SGL QUICK 34 (TOURNIQUET CUFF) ×3
CUFF TRNQT CYL 24X4X16.5-23 (TOURNIQUET CUFF) IMPLANT
CUFF TRNQT CYL 30X4X21-28X (TOURNIQUET CUFF) IMPLANT
CUFF TRNQT CYL 34X4.125X (TOURNIQUET CUFF) ×1 IMPLANT
DRAPE 3/4 80X56 (DRAPES) ×3 IMPLANT
DRAPE IMP U-DRAPE 54X76 (DRAPES) ×3 IMPLANT
DRSG MEPILEX SACRM 8.7X9.8 (GAUZE/BANDAGES/DRESSINGS) IMPLANT
DRSG OPSITE POSTOP 4X10 (GAUZE/BANDAGES/DRESSINGS) ×3 IMPLANT
DRSG OPSITE POSTOP 4X8 (GAUZE/BANDAGES/DRESSINGS) ×3 IMPLANT
ELECT REM PT RETURN 9FT ADLT (ELECTROSURGICAL) ×3
ELECTRODE REM PT RTRN 9FT ADLT (ELECTROSURGICAL) ×1 IMPLANT
FEMORAL CR LEFT 65MM (Joint) ×3 IMPLANT
GLOVE BIO SURGEON STRL SZ7.5 (GLOVE) ×12 IMPLANT
GLOVE BIO SURGEON STRL SZ8 (GLOVE) ×12 IMPLANT
GLOVE BIOGEL PI IND STRL 8 (GLOVE) ×1 IMPLANT
GLOVE BIOGEL PI INDICATOR 8 (GLOVE) ×2
GLOVE INDICATOR 8.0 STRL GRN (GLOVE) ×3 IMPLANT
GOWN STRL REUS W/ TWL LRG LVL3 (GOWN DISPOSABLE) ×1 IMPLANT
GOWN STRL REUS W/ TWL XL LVL3 (GOWN DISPOSABLE) ×1 IMPLANT
GOWN STRL REUS W/TWL LRG LVL3 (GOWN DISPOSABLE) ×3
GOWN STRL REUS W/TWL XL LVL3 (GOWN DISPOSABLE) ×3
HOLDER FOLEY CATH W/STRAP (MISCELLANEOUS) IMPLANT
HOOD PEEL AWAY FLYTE STAYCOOL (MISCELLANEOUS) ×12 IMPLANT
INSERT TIB BEARING 71 10 (Insert) ×3 IMPLANT
KIT TURNOVER KIT A (KITS) ×3 IMPLANT
MANIFOLD NEPTUNE II (INSTRUMENTS) ×3 IMPLANT
NEEDLE SPNL 20GX3.5 QUINCKE YW (NEEDLE) ×3 IMPLANT
NS IRRIG 1000ML POUR BTL (IV SOLUTION) ×3 IMPLANT
PACK TOTAL KNEE (MISCELLANEOUS) ×3 IMPLANT
PAD WRAPON POLAR KNEE (MISCELLANEOUS) ×1 IMPLANT
PATELLA STD 34X8.5 (Orthopedic Implant) ×3 IMPLANT
PENCIL SMOKE EVACUATOR (MISCELLANEOUS) ×3 IMPLANT
PLATE KNEE TIBIAL 71MM FIXED (Plate) ×3 IMPLANT
PULSAVAC PLUS IRRIG FAN TIP (DISPOSABLE) ×3
SOL .9 NS 3000ML IRR  AL (IV SOLUTION) ×2
SOL .9 NS 3000ML IRR AL (IV SOLUTION) ×1
SOL .9 NS 3000ML IRR UROMATIC (IV SOLUTION) ×1 IMPLANT
STAPLER SKIN PROX 35W (STAPLE) ×3 IMPLANT
SUCTION FRAZIER HANDLE 10FR (MISCELLANEOUS) ×2
SUCTION TUBE FRAZIER 10FR DISP (MISCELLANEOUS) ×1 IMPLANT
SUT VIC AB 0 CT1 36 (SUTURE) ×9 IMPLANT
SUT VIC AB 2-0 CT1 27 (SUTURE) ×9
SUT VIC AB 2-0 CT1 TAPERPNT 27 (SUTURE) ×3 IMPLANT
SYR 10ML LL (SYRINGE) ×3 IMPLANT
SYR 20ML LL LF (SYRINGE) ×3 IMPLANT
SYR 30ML LL (SYRINGE) IMPLANT
TIP FAN IRRIG PULSAVAC PLUS (DISPOSABLE) ×1 IMPLANT
TRAY FOLEY MTR SLVR 16FR STAT (SET/KITS/TRAYS/PACK) IMPLANT
WRAPON POLAR PAD KNEE (MISCELLANEOUS) ×3

## 2020-07-21 NOTE — Evaluation (Signed)
Physical Therapy Evaluation Patient Details Name: Tammy Hatfield MRN: [id number] DOB: 12/20/1945 Today's Date: 07/21/2020   History of Present Illness  admitted for acute hospitalization s/p L TKR (07/21/20), WBAT.  Clinical Impression  Upon evaluation, patient alert and oriented; follows commands, eager for participation with session as able.  Rates L knee pain 5/10 at rest; 7/10 with activity and knee flexion therex.  Demonstrates fair/good post-op strength (3-/5) and ROM (8-78 degrees) to L LE; mild difficulty with isolated L quad activation due to pain.  Currently requiring min assist for bed mobility; min assist for sit/stand, basic transfers with RW.  Decreased active use of L LE with movement transitions and with closed-chain activities.  Gait deferred due to pain, fatigue.  Will continue to assess/progress as able; anticipate consistent progression towards mobility goals. Would benefit from skilled PT to address above deficits and promote optimal return to PLOF; recommend transition to STR upon discharge from acute hospitalization.     Follow Up Recommendations SNF    Equipment Recommendations  Rolling walker with 5" wheels;3in1 (PT)    Recommendations for Other Services       Precautions / Restrictions Precautions Precautions: Fall Restrictions Weight Bearing Restrictions: Yes LLE Weight Bearing: Weight bearing as tolerated      Mobility  Bed Mobility Overal bed mobility: Needs Assistance Bed Mobility: Supine to Sit;Sit to Supine     Supine to sit: Min assist Sit to supine: Min assist   General bed mobility comments: assist for L LE management    Transfers Overall transfer level: Needs assistance Equipment used: Rolling walker (2 wheeled) Transfers: Sit to/from Stand Sit to Stand: Min assist         General transfer comment: tends/prefers to pull on RW despite cuing from therapist; limited active use of L LE with movement transition  Ambulation/Gait              General Gait Details: patient declined due to pain, fatigue  Stairs            Wheelchair Mobility    Modified Rankin (Stroke Patients Only)       Balance Overall balance assessment: Needs assistance Sitting-balance support: No upper extremity supported;Feet supported Sitting balance-Leahy Scale: Good     Standing balance support: Bilateral upper extremity supported Standing balance-Leahy Scale: Fair                               Pertinent Vitals/Pain Pain Assessment: 0-10 Pain Score: 5  Pain Location: L knee Pain Descriptors / Indicators: Aching;Guarding;Grimacing Pain Intervention(s): Limited activity within patient's tolerance;Monitored during session;Repositioned    Home Living Family/patient expects to be discharged to:: Private residence Living Arrangements: Alone   Type of Home: Apartment (3rd level apartment, elevator access) Home Access: Elevator     Home Layout: One level        Prior Function Level of Independence: Independent         Comments: Mod indep with ADLs, household and community mobilization; intermittent use of SPC as needed.  Denies recent fall history.  + driving.     Hand Dominance        Extremity/Trunk Assessment   Upper Extremity Assessment Upper Extremity Assessment: Overall WFL for tasks assessed    Lower Extremity Assessment Lower Extremity Assessment:  (L LE grossly at least 3-/5, L knee ROM 8-78 degrees, limited by pain.  R LE grossly WFL)  Communication   Communication: No difficulties  Cognition Arousal/Alertness: Awake/alert Behavior During Therapy: WFL for tasks assessed/performed                                   General Comments: very motivated to participate, progress with session as able      General Comments      Exercises Total Joint Exercises Goniometric ROM: L knee: 8-78 degrees, act assist Other Exercises Other Exercises: L LE supine  therex, 1x10, act assist ROM: ankle pumps, quad sets, SAQs, heel slides, hip abduct/adduct.  Difficulty with isolated quad activation due to pain. Other Exercises: In standing, worked to facilitate closed-chain L TKR; progressed towards lateral weight shifting to increased active use/WBing R LE.  Cga/min assist for balance throughout.   Assessment/Plan    PT Assessment Patient needs continued PT services  PT Problem List Decreased strength;Decreased range of motion;Decreased cognition;Decreased activity tolerance;Decreased balance;Decreased mobility;Decreased coordination;Decreased knowledge of use of DME;Decreased safety awareness;Decreased skin integrity;Pain       PT Treatment Interventions DME instruction;Gait training;Functional mobility training;Therapeutic activities;Therapeutic exercise;Balance training;Stair training;Cognitive remediation;Patient/family education    PT Goals (Current goals can be found in the Care Plan section)  Acute Rehab PT Goals Patient Stated Goal: to do what I have to do PT Goal Formulation: With patient Time For Goal Achievement: 08/04/20 Potential to Achieve Goals: Good    Frequency BID   Barriers to discharge        Co-evaluation               AM-PAC PT "6 Clicks" Mobility  Outcome Measure Help needed turning from your back to your side while in a flat bed without using bedrails?: A Little Help needed moving from lying on your back to sitting on the side of a flat bed without using bedrails?: A Little Help needed moving to and from a bed to a chair (including a wheelchair)?: A Little Help needed standing up from a chair using your arms (e.g., wheelchair or bedside chair)?: A Little Help needed to walk in hospital room?: A Lot Help needed climbing 3-5 steps with a railing? : A Lot 6 Click Score: 16    End of Session Equipment Utilized During Treatment: Gait belt Activity Tolerance: Patient tolerated treatment well Patient left: in  bed;with call bell/phone within reach Nurse Communication: Mobility status PT Visit Diagnosis: Muscle weakness (generalized) (M62.81);Difficulty in walking, not elsewhere classified (R26.2);Pain Pain - Right/Left: Left Pain - part of body: Knee    Time: 1515-1550 PT Time Calculation (min) (ACUTE ONLY): 35 min   Charges:   PT Evaluation $PT Eval Moderate Complexity: 1 Mod PT Treatments $Therapeutic Exercise: 8-22 mins $Therapeutic Activity: 8-22 mins       Oriel Rumbold H. Owens Shark, PT, DPT, NCS 07/21/20, 4:51 PM [phone]

## 2020-07-21 NOTE — Transfer of Care (Signed)
Immediate Anesthesia Transfer of Care Note  Patient: Tammy Hatfield  Procedure(s) Performed: TOTAL KNEE ARTHROPLASTY (Left Knee)  Patient Location: PACU  Anesthesia Type:Spinal  Level of Consciousness: awake, alert  and oriented  Airway & Oxygen Therapy: Patient Spontanous Breathing and Patient connected to face mask oxygen  Post-op Assessment: Report given to RN and Post -op Vital signs reviewed and stable  Post vital signs: Reviewed and stable  Last Vitals:  Vitals Value Taken Time  BP 92/50 07/21/20 1004  Temp 36.1 C 07/21/20 1004  Pulse 71 07/21/20 1007  Resp 16 07/21/20 1007  SpO2 95 % 07/21/20 1007  Vitals shown include unvalidated device data.  Last Pain:  Vitals:   07/21/20 0613  TempSrc: Oral  PainSc: 8          Complications: No complications documented.

## 2020-07-21 NOTE — Anesthesia Postprocedure Evaluation (Signed)
Anesthesia Post Note  Patient: SHATIMA ZALAR  Procedure(s) Performed: TOTAL KNEE ARTHROPLASTY (Left Knee)  Patient location during evaluation: PACU Anesthesia Type: Combined General/Spinal Level of consciousness: oriented and awake and alert Pain management: pain level controlled Vital Signs Assessment: post-procedure vital signs reviewed and stable Respiratory status: spontaneous breathing, respiratory function stable and patient connected to nasal cannula oxygen Cardiovascular status: blood pressure returned to baseline and stable Postop Assessment: no headache, no backache and no apparent nausea or vomiting Anesthetic complications: no   No complications documented.   Last Vitals:  Vitals:   07/21/20 1049 07/21/20 1115  BP: (!) 125/57   Pulse: 69 69  Resp: 13 13  Temp:  (!) 36.4 C  SpO2: 96% 95%    Last Pain:  Vitals:   07/21/20 1115  TempSrc:   PainSc: 0-No pain    LLE Motor Response: Purposeful movement (07/21/20 1113) LLE Sensation: Tingling (07/21/20 1113) RLE Motor Response: Purposeful movement (07/21/20 1113) RLE Sensation: Tingling (07/21/20 1113)      Arita Miss

## 2020-07-21 NOTE — Anesthesia Procedure Notes (Signed)
Spinal  Patient location during procedure: OR Start time: 07/21/2020 7:32 AM End time: 07/21/2020 7:45 AM Staffing Performed: anesthesiologist and resident/CRNA  Anesthesiologist: Arita Miss, MD Resident/CRNA: Tollie Eth, CRNA Preanesthetic Checklist Completed: patient identified, IV checked, site marked, risks and benefits discussed, surgical consent, monitors and equipment checked, pre-op evaluation and timeout performed Spinal Block Patient position: sitting Prep: Betadine Patient monitoring: heart rate, continuous pulse ox, blood pressure and cardiac monitor Approach: midline Location: L4-5 Injection technique: single-shot Needle Needle type: Introducer and Quincke  Needle gauge: 22 G Needle length: 9 cm Additional Notes Negative paresthesia. Negative blood return. Positive free-flowing CSF. Expiration date of kit checked and confirmed. Patient tolerated procedure well, without complications.

## 2020-07-21 NOTE — H&P (Signed)
History of Present Illness: Tammy Hatfield is a 74 y.o.female who is being referred by Reche Dixon, PA-C, for left knee pain. The symptoms began several years ago and developed without any specific cause or injury. The patient has been treated over the years by Dr. Jefm Bryant with steroid injections which she states have provided good temporary relief of her symptoms. Over the past few months, her symptoms have worsened and the steroid injections are no longer effective, prompting her to be referred to me for further evaluation and treatment. She reports no pain at rest, but notes that the pain can be moderate to severe with certain activities or motions. The pain is located along the medial aspect of the knee. The pain is described as aching, stabbing and throbbing. The symptoms are aggravated using stairs, at higher levels of activity, walking and standing pivot. She also describes no mechanical symptoms. She has associated swelling and no deformity. She has tried acetaminophen, over-the-counter medications and steroid injections with temporary partial relief.  Current Outpatient Medications: . aspirin 81 MG chewable tablet Take 1 tablet by mouth once daily  . cholecalciferol (CHOLECALCIFEROL) 1000 unit tablet Take 1,000 Units by mouth once daily  . cyanocobalamin (VITAMIN B12) 1000 MCG tablet Take 1,000 mcg by mouth once daily  . fexofenadine (ALLEGRA) 180 MG tablet Take 180 mg by mouth once daily  . FUROsemide (LASIX) 20 MG tablet Take 1 tablet (20 mg total) by mouth once daily 30 tablet 11  . insulin DEGLUDEC (TRESIBA FLEXTOUCH U-200) pen injector (concentration 200 units/mL) Inject 44 Units subcutaneously once daily 6 mL 11  . isosorbide mononitrate (IMDUR) 30 MG ER tablet Take 2 tablets (60 mg total) by mouth once daily 30 tablet 11  . losartan (COZAAR) 50 MG tablet TAKE 1 TABLET BY MOUTH ONCE DAILY - STOP BENAZEPRIL 90 tablet 0  . metoprolol succinate (TOPROL-XL) 50 MG XL tablet Take 1.5 tablets  (75 mg total) by mouth once daily 45 tablet 11  . miscellaneous medical supply Misc AutoPAP 5-15 cm H20 1 each 0  . nitroGLYcerin (NITROSTAT) 0.4 MG SL tablet Place 0.4 mg under the tongue every 5 (five) minutes as needed for Chest pain May take up to 3 doses.  Glory Rosebush VERIO FLEX START kit Use to check blood sugars daily 1 each 0  . ONETOUCH VERIO TEST STRIPS test strip USE 1 STRIP TO CHECK GLUCOSE THREE TIMES DAILY 300 each 0  . pantoprazole (PROTONIX) 20 MG DR tablet Take 20 mg by mouth once daily  . PARoxetine (PAXIL) 20 MG tablet Take 1 tablet (20 mg total) by mouth once daily 30 tablet 11  . semaglutide (OZEMPIC) 0.25 mg or 0.5 mg(2 mg/1.5 mL) pen injector Take 0.25 mg weekly for 4 weeks then adjust to 0.5 mg weekly. 1.5 mL 5  . traMADoL (ULTRAM) 50 mg tablet TAKE 1 TABLET BY MOUTH EVERY 6 HOURS AS NEEDED 100 tablet 0  . valACYclovir (VALTREX) 500 MG tablet TAKE 1 TABLET BY MOUTH TWICE DAILY FOR 3 DAYS AS NEEDED FOR FLARE 12 tablet 5   Allergies:  . Donepezil Hallucination  Patient has severe nightmares  . Metformin Diarrhea  . Amoxicillin Other (See Comments)  Yeast infection Reaction: Yeast infection Yeast infection DID THE REACTION INVOLVE: Swelling of the face/tongue/throat, SOB, or low BP? No Sudden or severe rash/hives, skin peeling, or the inside of the mouth or nose? No Did it require medical treatment? No When did it last happen?within the past 10 years If all  above answers are "NO", may proceed with cephalosporin use.  . Benazepril Cough  . Bupropion Other (See Comments)  Other reaction(s): Dizziness, Headache  . Byetta [Exenatide] Diarrhea  . Crestor [Rosuvastatin] Muscle Pain  . Gabapentin Other (See Comments)  Mouth blisters, joint pain, depression Mouth blisters, joint pain, depression Reaction: Mouth blister, joint pain and depression Mouth blisters, joint pain, depression  . Moxifloxacin Muscle Pain and Other (See Comments)  Reaction: Muscle  pain Muscle pain  . Pantoprazole Abdominal Pain  . Simvastatin Unknown  . Statins-Hmg-Coa Reductase Inhibitors Other (See Comments)  Reaction: Mouth blisters and joint pain  . Sulfa (Sulfonamide Antibiotics) Headache and Other (See Comments)  Headache  . Sulfasalazine Other (See Comments)  Reaction: Headache . Cefaclor Rash   Past Medical History:  . Abdominal hernia  . Acute diastolic congestive heart failure (CMS-HCC) 02/22/2015  . Anxiety  . Aortic atherosclerosis (CMS-HCC)  ON CT imaging 05/02/2016  . Breast cancer (CMS-HCC) 2014  Follows with Dr Grayland Ormond, s/p radiation, on anastrozole  . Cataracts, bilateral  . CHF (congestive heart failure) (CMS-HCC)  . Chronic kidney disease  . Chronic sinusitis  . Colon polyp  . COPD (chronic obstructive pulmonary disease) (CMS-HCC)  . Coronary artery disease involving native coronary artery of native heart without angina pectoris 07/10/2017  S/p stent 06/2017, s/p angioplasty 02/2019  . Depression  . Diverticulosis of large intestine without hemorrhage 07/03/2016  . Esophageal reflux  . Essential hypertension, benign  . Fissure, anal 08/23/2017  . H. pylori infection  . History of abnormal cervical Pap smear 2021  . History of Barrett's esophagus 08/23/2017  . Lateral epicondylitis of right elbow 11/10/2015  . Lumbar disc disease  . Neuropathy  . Osteoarthritis of both knees  . Peptic ulcer  History of  . Pure hypercholesterolemia  . Sleep apnea  with previous pharyngeal surgery  . Type II or unspecified type diabetes mellitus without mention of complication, uncontrolled  with diabetic peripheral neuropathy   Past Surgical History:  . Arthroscopic subacronial decompression plus release of the long head biceps tendon followed by arthroscopic rotator cuff repair Right 6.1.16  . BLEPHAROPLASTY  . Bunionectomy  . CHOLECYSTECTOMY  . COLON SURGERY  polp biopsy  . COLONOSCOPY 10/03/1992  Hyperplastic Polyps  . COLONOSCOPY  03/19/1997  Incomplete, Unable to advance scope  . COLONOSCOPY 04/12/2014  Adenomatous Polyps, FHCC (Mother), FH Colon Polyps: [facility] 04/2019 Recall ltr mailed  . COLONOSCOPY  . DILATION AND CURETTAGE, DIAGNOSTIC / THERAPEUTIC  . EGD 07/07/1990  . EGD 03/19/1997  . EGD 11/01/2000  . EGD 01/09/2007  Barrett's Esophagus  . EGD 03/30/2011  [facility] Barrett's Esophagus  . EGD 04/12/2014  [place] Barrett's Esophagus: [facility] 04/2019 Recall ltr mailed  . GASTRIC FUNDOPLICATION  . HERNIA REPAIR  . HYSTERECTOMY 1985  Dr Laverta [place]  . KNEE ARTHROSCOPY Right 11/2005  . LAPAROSCOPY DIAGNOSTIC 1981  . Lumpectomy for breast cancer  . MASTECTOMY  lumpectomy, limp node from auxilary  . POLYPECTOMY  . RESECTION LATERAL PHARYNGEAL WALL W/LOCAL ADVANCEMENT FLAP  . SIGMOIDOSCOPY  . SIGMOIDOSCOPY FLEXIBLE 04/08/2000  . SIGMOIDOSCOPY FLEXIBLE 01/22/2007  [place] (Mother) FH Colon Polyps (Sister)  . TONSILLECTOMY ?  . TUBAL LIGATION  . UPPER GASTROINTESTINAL ENDOSCOPY  . UPPP for sleep apnea 1999   Family History:  . Emphysema Mother  . Colon cancer Mother 96  . Cancer Mother  died of ca of colon  . Alcohol abuse Mother  . Anxiety Mother  . Depression Mother  . Obesity  Mother  . Colon polyps Sister  . Colon cancer Maternal Aunt  . Colon cancer Maternal Aunt  . High blood pressure (Hypertension) Son  . Back Pain Son  . Thyroid disease Daughter  . Colon cancer Paternal Aunt  . Stroke Paternal Aunt  . Colon polyps Maternal Aunt  . Asthma Maternal Grandmother  . Diabetes Maternal Grandmother  . High blood pressure (Hypertension) Maternal Grandmother  . Osteoarthritis Maternal Grandmother  . Diabetes type II Maternal Grandmother  . Glaucoma Maternal Grandmother  . Obesity Maternal Grandmother  . Myasthenia gravis Daughter  . Colon cancer Daughter  . Skin cancer Daughter  . Thyroid disease Daughter  . Hyperlipidemia (Elevated cholesterol) Daughter  . High blood pressure (Hypertension) Daughter  .  Obesity Daughter  . Colon cancer Paternal Grandfather   Social History:   Socioeconomic History:  Marland Kitchen Marital status: Widowed  Spouse name: Not on file  . Number of children: 3  . Years of education: Not on file  . Highest education level: Not on file  Occupational History  . Not on file  Tobacco Use  . Smoking status: Former Smoker  Packs/day: 1.00  Years: 20.00  Pack years: 20.00  Types: Cigarettes  Start date: 09/10/1960  Quit date: 09/10/1998  Years since quitting: 21.5  . Smokeless tobacco: Never Used  . Tobacco comment: stoppped 1999  Vaping Use  . Vaping Use: Never used  Substance and Sexual Activity  . Alcohol use: Not Currently  Alcohol/week: 0.0 standard drinks  . Drug use: No  . Sexual activity: Not Currently  Partners: Male  Birth control/protection: None  Comment: Oral sex only  Other Topics Concern  . Not on file  Social History Narrative  Retired Therapist, sports  married (husband has dementia and multiple CVAs and is my pt)  Former smoker  no etoh   Social Determinants of Health:   Emergency planning/management officer Strain:  . Difficulty of Paying Living Expenses:  Food Insecurity:  . Worried About Charity fundraiser in the Last Year:  . Arboriculturist in the Last Year:  Transportation Needs:  . Film/video editor (Medical):  Marland Kitchen Lack of Transportation (Non-Medical):   Review of Systems:  A comprehensive 14 point ROS was performed, reviewed, and the pertinent orthopaedic findings are documented in the HPI.  Physical Exam: Vitals:  03/23/20 1344  BP: 98/60  Height: 162.6 cm ($RemoveB'5\' 4"'[id number]$ )  PainSc: 0-No pain  PainLoc: Knee   General/Constitutional: Pleasant overweight elderly female in no acute distress. Neuro/Psych: Normal mood and affect, oriented to person, place and time. Eyes: Non-icteric. Pupils are equal, round, and reactive to light, and exhibit synchronous movement. Lymphatic: No palpable adenopathy. Respiratory: Lungs are clear to auscultation. No wheezes and  Non-labored breathing Cardiovascular: Regular rate and rhythm, no murmurs. No edema, swelling or tenderness, except as noted in detailed exam. Vascular: No edema, swelling or tenderness, except as noted in detailed exam. Integumentary: No impressive skin lesions present, except as noted in detailed exam. Musculoskeletal: Unremarkable, except as noted in detailed exam.  Left knee exam: GAIT: mild limp and uses no assistive devices. ALIGNMENT: mild varus SKIN: Varicose veins, otherwise unremarkable SWELLING: minimal EFFUSION: trace WARMTH: no warmth TENDERNESS: moderate tenderness over the medial joint line, mild tenderness along lateral joint line ROM: 10 to 100 degrees with mild soreness at the extremes of flexion and extension McMURRAY'S: equivocal PATELLOFEMORAL: normal tracking with no peri-patellar tenderness and negative apprehension sign CREPITUS: no LACHMAN'S: negative PIVOT SHIFT: negative ANTERIOR DRAWER: negative  POSTERIOR DRAWER: negative VARUS/VALGUS: positive pseudolaxity to varus stressing  She is neurovascularly intact to the left lower extremity and foot.  Knee Imaging, external: Left knee: A recent MRI scan of the left knee is available for review. By report, the scan demonstrates evidence of advanced "disintegration" of the medial meniscus with "degeneration of the remnants of the anterior posterior horn". The lateral meniscus is intact as are the cruciate and collateral ligaments. There are advanced degenerative changes of the medial compartment and mild degenerative changes of the lateral and patellofemoral compartments. Both the films and report were reviewed by myself and discussed with the patient.  Assessment:  Primary osteoarthritis of left knee.   Plan: The treatment options were discussed with the patient. In addition, patient educational materials were provided regarding the diagnosis and treatment options. The patient is quite frustrated by her symptoms  and functional limitations, and is ready to consider more aggressive treatment options. Therefore, I have recommended a surgical procedure, specifically a left total knee arthroplasty. The procedure was discussed with the patient, as were the potential risks (including bleeding, infection, nerve and/or blood vessel injury, persistent or recurrent pain, loosening and/or failure of the components, dislocation, need for further surgery, blood clots, strokes, heart attacks and/or arhythmias, pneumonia, etc.) and benefits. The patient states his/her understanding and wishes to proceed. All of the patient's questions and concerns were answered. She can call any time with further concerns. She will follow up post-surgery, routine.    H&P reviewed and patient re-examined. No changes.

## 2020-07-21 NOTE — Op Note (Signed)
07/21/2020  9:58 AM  Patient:   Tammy Hatfield  Pre-Op Diagnosis:   Degenerative joint disease, left knee.  Post-Op Diagnosis:   Same  Procedure:   Left TKA using all-cemented [employer] Vanguard system with a 65 mm PCR femur, a 71 mm tibial tray with a 10 mm anterior stabilized E-poly insert, and a 34 x 8.5 mm all-poly 3-pegged domed patella.  Surgeon:   Pascal Lux, MD  Assistant:   Cameron Proud, PA-C; Sherlene Shams, PA-S  Anesthesia:   Spinal  Findings:   As above  Complications:   None  EBL:   20 cc  Fluids:   800 cc crystalloid  UOP:   None  TT:   90 minutes at 300 mmHg  Drains:   None  Closure:   Staples  Implants:   As above  Brief Clinical Note:   The patient is a 74 year old female with a long history of progressively worsening left knee pain. The patient's symptoms have progressed despite medications, activity modification, injections, etc. The patient's history and examination were consistent with advanced degenerative joint disease of the left knee confirmed by plain radiographs. The patient presents at this time for a left total knee arthroplasty.  Procedure:   The patient was brought into the operating room. After adequate spinal anesthesia was obtained, the patient was lain in the supine position before the left lower extremity was prepped with ChloraPrep solution and draped sterilely. Preoperative antibiotics were administered. After verifying the proper laterality with a surgical timeout, the limb was exsanguinated with an Esmarch and the tourniquet inflated to 300 mmHg. A standard anterior approach to the knee was made through an approximately 7 inch incision. The incision was carried down through the subcutaneous tissues to expose superficial retinaculum. This was split the length of the incision and the medial flap elevated sufficiently to expose the medial retinaculum. The medial retinaculum was incised, leaving a 3-4 mm cuff of tissue on the patella. This  was extended distally along the medial border of the patellar tendon and proximally through the medial third of the quadriceps tendon. A subtotal fat pad excision was performed before the soft tissues were elevated off the anteromedial and anterolateral aspects of the proximal tibia to the level of the collateral ligaments. The anterior portions of the medial and lateral menisci were removed, as was the anterior cruciate ligament. With the knee flexed to 90, the external tibial guide was positioned and the appropriate proximal tibial cut made. This piece was taken to the back table where it was measured and found to be optimally replicated by a 71 mm component.  Attention was directed to the distal femur. The intramedullary canal was accessed through a 3/8" drill hole. The intramedullary guide was inserted and positioned in order to obtain a neutral flexion gap. The intercondylar block was positioned with care taken to avoid notching the anterior cortex of the femur. The appropriate cut was made. Next, the distal cutting block was placed at 5 of valgus alignment. Using the 9 mm slot, the distal cut was made. The distal femur was measured and found to be optimally replicated by the 65 mm component. The 65 mm 4-in-1 cutting block was positioned and first the posterior, then the posterior chamfer, the anterior chamfer, and finally the anterior cuts were made. At this point, the posterior portions medial and lateral menisci were removed. A trial reduction was performed using the appropriate femoral and tibial components with the 10 mm insert. This demonstrated excellent  stability to varus and valgus stressing both in flexion and extension while permitting full extension. Patella tracking was assessed and found to be excellent. Therefore, the tibial guide position was marked on the proximal tibia. The patella thickness was measured and found to be 22 mm. Therefore, the appropriate cut was made. The patellar surface  was measured and found to be optimally replicated by the 34 mm component. The three peg holes were drilled in place before the trial button was inserted. Patella tracking was assessed and found to be excellent, passing the "no thumb test". The lug holes were drilled into the distal femur before the trial component was removed, leaving only the tibial tray. The keel was then created using the appropriate tower, reamer, and punch.  The bony surfaces were prepared for cementing by irrigating them thoroughly with sterile saline solution via the jet lavage system. A bone plug was fashioned from some of the bone that had been removed previously and used to plug the distal femoral canal. In addition, 20 cc of Exparel diluted out to 60 cc with normal saline and 30 cc of 0.5% Sensorcaine were injected into the postero-medial and postero-lateral aspects of the knee, the medial and lateral gutter regions, and the peri-incisional tissues to help with postoperative analgesia. Meanwhile, the cement was being mixed on the back table. When it was ready, the tibial tray was cemented in first. The excess cement was removed using [employer] fast streamer. Next, the femoral component was impacted into place. Again, the excess cement was removed using [employer] fast streamer. The 10 mm trial insert was positioned and the knee brought into extension while the cement hardened. Finally, the patella was cemented into place and secured using the patellar clamp. Again, the excess cement was removed using [employer] fast streamer. Once the cement had hardened, the knee was placed through a range of motion with the findings as described above. Therefore, the trial insert was removed and, after verifying that no cement had been retained posteriorly, the permanent 10 mm anterior stabilized E-polyethylene insert was positioned and secured using the appropriate key locking mechanism. Again the knee was placed through a range of motion with the findings as described  above.  The wound was copiously irrigated with sterile saline solution using the jet lavage system before the quadriceps tendon and retinacular layer were reapproximated using #0 Vicryl interrupted sutures. The superficial retinacular layer also was closed using a running #0 Vicryl suture. A total of 10 cc of transexemic acid (TXA) was injected intra-articularly before the subcutaneous tissues were closed in several layers using 2-0 Vicryl interrupted sutures. The skin was closed using staples. A sterile honeycomb dressing was applied to the skin before the leg was wrapped with an Ace wrap to accommodate the Polar Care device. The patient was then awakened and returned to the recovery room in satisfactory condition after tolerating the procedure well.

## 2020-07-21 NOTE — Anesthesia Preprocedure Evaluation (Signed)
Anesthesia Evaluation  Patient identified by MRN, date of birth, ID band Patient awake    Reviewed: Allergy & Precautions, NPO status , Patient's Chart, lab work & pertinent test results  History of Anesthesia Complications Negative for: history of anesthetic complications  Airway Mallampati: I  TM Distance: >3 FB Neck ROM: Full    Dental no notable dental hx. (+) Teeth Intact   Pulmonary sleep apnea , COPD, Patient abstained from smoking.Not current smoker, former smoker,  S/p UPPP, says she thinks her OSA has been worse since it. However, stopped wearing her cpap after her cardiac stent in 2020 as her dyspnea improved   Pulmonary exam normal breath sounds clear to auscultation       Cardiovascular Exercise Tolerance: Good METShypertension, + CAD, + Cardiac Stents and +CHF  (-) Past MI (-) dysrhythmias + Valvular Problems/Murmurs  Rhythm:Regular Rate:Normal - Systolic murmurs Stents, last placed in 2020 TTE 2016: - Left ventricle: The cavity size was normal. Systolic function was  normal. The estimated ejection fraction was 60%. Wall motion was  normal; there were no regional wall motion abnormalities. Doppler  parameters are consistent with abnormal left ventricular  relaxation (grade 1 diastolic dysfunction).  - Aortic valve: There was mild regurgitation. Valve area (Vmax):  1.83 cm^2.  - Mitral valve: There was mild regurgitation.  - Atrial septum: No defect or patent foramen ovale was identified.  - Tricuspid valve: There was moderate regurgitation.    Neuro/Psych PSYCHIATRIC DISORDERS Anxiety negative neurological ROS     GI/Hepatic PUD, GERD  Controlled and Medicated,(+)     (-) substance abuse  ,   Endo/Other  diabetes  Renal/GU negative Renal ROS     Musculoskeletal   Abdominal   Peds  Hematology  (+) Blood dyscrasia, , Not on blood thinners anymore (took for 1 year after stent)    Anesthesia Other Findings Past Medical History: No date: Anginal pain ([place]) No date: Anxiety No date: Arthritis     Comment:  knees No date: Barrett's esophagus 2014: Breast cancer ([place])     Comment:  RT LUMPECTOMY 2014: Cancer ([place])     Comment:  right breast - s\p Radiation and lumpectomy and LN               dissection No date: Chronic sinusitis No date: Complication of anesthesia     Comment:  hard to wake after Fentanyl 2012: COPD (chronic obstructive pulmonary disease) ([place]) No date: Coronary artery disease No date: Degenerative lumbar disc No date: Diabetes mellitus without complication (HCC) No date: Dysphagia No date: Esophageal reflux No date: Fatty liver No date: History of Helicobacter pylori infection No date: Hypercholesteremia No date: Hyperlipidemia No date: Hypertension No date: Personal history of radiation therapy No date: Pneumonia 2014: Radiation     Comment:  RT BREAST CA No date: Sleep apnea     Comment:  CPAP No date: Ulcer, stomach peptic     Comment:  history of No date: Vertigo     Comment:  last episode 2017(approx)  Reproductive/Obstetrics                             Anesthesia Physical  Anesthesia Plan  ASA: III  Anesthesia Plan: General/Spinal   Post-op Pain Management:    Induction: Intravenous  PONV Risk Score and Plan: 3 and Ondansetron, Dexamethasone, Propofol infusion, TIVA and Treatment may vary due to age or medical condition  Airway Management Planned: Natural  Airway  Additional Equipment: None  Intra-op Plan:   Post-operative Plan:   Informed Consent: I have reviewed the patients History and Physical, chart, labs and discussed the procedure including the risks, benefits and alternatives for the proposed anesthesia with the patient or authorized representative who has indicated his/her understanding and acceptance.       Plan Discussed with: CRNA and Surgeon  Anesthesia Plan Comments:  (Discussed R/B/A of neuraxial anesthesia technique with patient: - rare risks of spinal/epidural hematoma, nerve damage, infection - Risk of PDPH - Risk of nausea and vomiting - Risk of conversion to general anesthesia and its associated risks, including sore throat, damage to lips/teeth/oropharynx, and rare risks such as cardiac and respiratory events.  Patient voiced understanding.)        Anesthesia Quick Evaluation

## 2020-07-21 NOTE — Progress Notes (Signed)
ERROR IN CHARTING - PT DID NOT ARRIVE TO POSTOP

## 2020-07-22 ENCOUNTER — Encounter: Payer: Self-pay | Admitting: Surgery

## 2020-07-22 LAB — BASIC METABOLIC PANEL
Anion gap: 6 (ref 5–15)
BUN: 19 mg/dL (ref 8–23)
CO2: 27 mmol/L (ref 22–32)
Calcium: 8.3 mg/dL — ABNORMAL LOW (ref 8.9–10.3)
Chloride: 103 mmol/L (ref 98–111)
Creatinine, Ser: 0.84 mg/dL (ref 0.44–1.00)
GFR, Estimated: >60 mL/min (ref >60)
Glucose, Bld: 175 mg/dL — ABNORMAL HIGH (ref 70–99)
Potassium: 4.3 mmol/L (ref 3.5–5.1)
Sodium: 136 mmol/L (ref 135–145)

## 2020-07-22 LAB — CBC
HCT: 34.8 % — ABNORMAL LOW (ref 36.0–46.0)
Hemoglobin: 11.5 g/dL — ABNORMAL LOW (ref 12.0–15.0)
MCH: 29.9 pg (ref 26.0–34.0)
MCHC: 33 g/dL (ref 30.0–36.0)
MCV: 90.6 fL (ref 80.0–100.0)
Platelets: 167 10*3/uL (ref 150–400)
RBC: 3.84 MIL/uL — ABNORMAL LOW (ref 3.87–5.11)
RDW: 13.1 % (ref 11.5–15.5)
WBC: 8.9 10*3/uL (ref 4.0–10.5)
nRBC: 0 % (ref 0.0–0.2)

## 2020-07-22 LAB — GLUCOSE, CAPILLARY
Glucose-Capillary: 170 mg/dL — ABNORMAL HIGH (ref 70–99)
Glucose-Capillary: 151 mg/dL — ABNORMAL HIGH (ref 70–99)
Glucose-Capillary: 120 mg/dL — ABNORMAL HIGH (ref 70–99)
Glucose-Capillary: 179 mg/dL — ABNORMAL HIGH (ref 70–99)

## 2020-07-22 MED ORDER — ONDANSETRON HCL 4 MG PO TABS: 4.0000 mg | ORAL_TABLET | Freq: Four times a day (QID) | ORAL | 0 refills | Status: DC | PRN

## 2020-07-22 MED ORDER — ENOXAPARIN SODIUM 40 MG/0.4ML ~~LOC~~ SOLN: 40.0000 mg | SUBCUTANEOUS | 0 refills | Status: DC

## 2020-07-22 MED ORDER — OXYCODONE HCL 5 MG PO TABS
5.0000 mg | ORAL_TABLET | ORAL | Status: DC | PRN
  Administered 2020-07-22 – 2020-07-23 (×2): 10 mg via ORAL
  Filled 2020-07-22 (×2): qty 2

## 2020-07-22 MED ORDER — TRAMADOL HCL 50 MG PO TABS: 50.0000 mg | ORAL_TABLET | Freq: Four times a day (QID) | ORAL | 0 refills | Status: DC | PRN

## 2020-07-22 MED ORDER — OXYCODONE HCL 5 MG PO TABS
5.0000 mg | ORAL_TABLET | ORAL | 0 refills | Status: DC | PRN
Start: 2020-07-22 — End: 2020-07-25

## 2020-07-22 NOTE — NC FL2 (Signed)
[place] LEVEL OF CARE SCREENING TOOL     IDENTIFICATION  Patient Name: Tammy Hatfield Birthdate: 10-11-1945 Sex: female Admission Date (Current Location): 07/21/2020  [place] and [place] Number:  Engineering geologist and Address:  [facility], [address] [place], [place] [address]      Provider Number: [id number]  Attending Physician Name and Address:  Corky Mull, MD  Relative Name and Phone Number:  Janei Scheff [phone]    Current Level of Care: Hospital Recommended Level of Care: [place] Prior Approval Number:    Date Approved/Denied:   PASRR Number: [id number]  Discharge Plan: SNF    Current Diagnoses: Patient Active Problem List   Diagnosis Date Noted  . Status post total knee replacement using cement, left 07/21/2020  . Benign essential hypertension 01/28/2020  . Breast cancer ([place]) 01/28/2020  . Lumbar disc disease 01/28/2020  . Pure hypercholesterolemia 01/28/2020  . Statin intolerance 11/25/2019  . SOB (shortness of breath) on exertion 09/17/2019  . Unstable angina ([place]) 09/16/2018  . Long-term insulin use ([place]) 07/08/2018  . Uncontrolled type 2 diabetes mellitus with hyperglycemia ([place]) 07/08/2018  . Obesity (BMI 35.0-39.9 without comorbidity) 06/12/2018  . Fissure, anal 08/23/2017  . History of Barrett's esophagus 08/23/2017  . Other constipation 08/23/2017  . Coronary artery disease of native artery of native heart with stable angina pectoris ([place]) 07/10/2017  . Coronary artery disease involving native coronary artery of native heart without angina pectoris 07/10/2017  . Chest pain 07/05/2017  . S/P drug eluting coronary stent placement 07/05/2017  . Primary osteoarthritis of right knee 04/15/2017  . Primary cancer of upper inner quadrant of right female breast ([place]) 12/31/2016  . Sinus congestion 11/13/2016  . Diverticulosis of large intestine without hemorrhage  07/03/2016  . Lateral epicondylitis of right elbow 11/10/2015  . Left knee pain 10/20/2015  . Primary osteoarthritis of left knee 04/28/2015  . Status post rotator cuff repair 03/10/2015  . CHF (congestive heart failure) ([place]) 02/11/2015  . OSA on CPAP 02/11/2015  . COPD (chronic obstructive pulmonary disease) ([place]) 02/11/2015  . Former smoker 02/11/2015  . HTN (hypertension) 02/11/2015  . Diabetes ([place]) 02/11/2015  . Rotator cuff rupture 02/11/2015  . Hyperlipidemia 02/11/2015  . GERD (gastroesophageal reflux disease) 02/11/2015  . PUD (peptic ulcer disease) 02/11/2015  . Dysphagia 02/11/2015  . ARF (acute renal failure) ([place]) 02/11/2015  . Impingement syndrome, shoulder, right 07/27/2014    Orientation RESPIRATION BLADDER Height & Weight     Self, Time, Situation, Place  Normal Continent, External catheter Weight: 95.3 kg Height:  5\' 4"  (162.6 cm)  BEHAVIORAL SYMPTOMS/MOOD NEUROLOGICAL BOWEL NUTRITION STATUS      Continent Diet (Heart Healthy, Carb Modified)  AMBULATORY STATUS COMMUNICATION OF NEEDS Skin   Extensive Assist Verbally Surgical wounds                       Personal Care Assistance Level of Assistance  Bathing, Feeding, Dressing Bathing Assistance: Limited assistance Feeding assistance: Independent Dressing Assistance: Limited assistance     Functional Limitations Info  Sight, Hearing, Speech Sight Info: Adequate Hearing Info: Adequate Speech Info: Adequate    SPECIAL CARE FACTORS FREQUENCY  PT (By licensed PT), OT (By licensed OT)                    Contractures Contractures Info: Not present    Additional Factors Info  Code Status, Allergies Code Status Info: Full  Allergies Info: Donepezil, Avalox, Amoxicillin, Benazipril, Buproprion, Byetta, Isosorbide, Metformin, Neurontin, Pantoprazole, Sulfa, Ceclor           Current Medications (07/22/2020):  This is the current hospital active medication list Current Facility-Administered  Medications  Medication Dose Route Frequency Provider Last Rate Last Admin  . 0.9 %  sodium chloride infusion   Intravenous Continuous Poggi, Marshall Cork, MD 75 mL/hr at 07/22/20 0600 Rate Verify at 07/22/20 0600  . acetaminophen (TYLENOL) tablet 1,000 mg  1,000 mg Oral Q6H Poggi, Marshall Cork, MD   1,000 mg at 07/22/20 4967  . acetaminophen (TYLENOL) tablet 325-650 mg  325-650 mg Oral Q6H PRN Poggi, Marshall Cork, MD      . aspirin EC tablet 81 mg  81 mg Oral Daily Poggi, Marshall Cork, MD      . bisacodyl (DULCOLAX) suppository 10 mg  10 mg Rectal Daily PRN Poggi, Marshall Cork, MD      . calcium carbonate (TUMS - dosed in mg elemental calcium) chewable tablet 1,500 mg  1,500 mg Oral Daily PRN Poggi, Marshall Cork, MD      . carboxymethylcellul-glycerin (REFRESH OPTIVE) 0.5-0.9 % ophthalmic solution 1 drop  1 drop Both Eyes Daily PRN Poggi, Marshall Cork, MD      . Chlorhexidine Gluconate Cloth 2 % PADS 6 each  6 each Topical Daily Poggi, Marshall Cork, MD   6 each at 07/21/20 2101  . cholecalciferol (VITAMIN D) tablet 1,000 Units  1,000 Units Oral Daily Poggi, Marshall Cork, MD      . diphenhydrAMINE (BENADRYL) 12.5 MG/5ML elixir 12.5-25 mg  12.5-25 mg Oral Q4H PRN Poggi, Marshall Cork, MD      . docusate sodium (COLACE) capsule 100 mg  100 mg Oral BID Corky Mull, MD   100 mg at 07/21/20 2126  . enoxaparin (LOVENOX) injection 40 mg  40 mg Subcutaneous Q24H Poggi, Marshall Cork, MD   40 mg at 07/22/20 0814  . HYDROmorphone (DILAUDID) injection 0.25-0.5 mg  0.25-0.5 mg Intravenous Q4H PRN Poggi, Marshall Cork, MD      . insulin aspart (novoLOG) injection 0-20 Units  0-20 Units Subcutaneous TID WC Poggi, Marshall Cork, MD   4 Units at 07/22/20 [phone]  . insulin glargine (LANTUS) injection 66 Units  66 Units Subcutaneous QHS Benn Moulder, RPH      . isosorbide mononitrate (IMDUR) 24 hr tablet 30 mg  30 mg Oral BH-q7a Poggi, Marshall Cork, MD   30 mg at 07/22/20 3846  . ketorolac (TORADOL) 15 MG/ML injection 7.5 mg  7.5 mg Intravenous Q6H Poggi, Marshall Cork, MD   7.5 mg at 07/22/20 6599   . loratadine (CLARITIN) tablet 10 mg  10 mg Oral Daily Poggi, Marshall Cork, MD      . magnesium hydroxide (MILK OF MAGNESIA) suspension 30 mL  30 mL Oral Daily PRN Poggi, Marshall Cork, MD      . metoCLOPramide (REGLAN) tablet 5-10 mg  5-10 mg Oral Q8H PRN Poggi, Marshall Cork, MD       Or  . metoCLOPramide (REGLAN) injection 5-10 mg  5-10 mg Intravenous Q8H PRN Poggi, Marshall Cork, MD      . nitroGLYCERIN (NITROSTAT) SL tablet 0.4 mg  0.4 mg Sublingual Q5 min PRN Poggi, Marshall Cork, MD      . ondansetron (ZOFRAN) tablet 4 mg  4 mg Oral Q6H PRN Poggi, Marshall Cork, MD       Or  . ondansetron (ZOFRAN) injection 4 mg  4 mg Intravenous Q6H  PRN Poggi, Marshall Cork, MD      . oxyCODONE (Oxy IR/ROXICODONE) immediate release tablet 5-10 mg  5-10 mg Oral Q4H PRN Poggi, Marshall Cork, MD   5 mg at 07/21/20 2147  . pantoprazole (PROTONIX) EC tablet 20 mg  20 mg Oral Ivette Loyal, Marshall Cork, MD   20 mg at 07/22/20 6701  . PARoxetine (PAXIL) tablet 20 mg  20 mg Oral Ivette Loyal, Marshall Cork, MD   20 mg at 07/22/20 4103  . [START ON 07/24/2020] Semaglutide(0.25 or 0.5MG /DOS) SOPN 1 mg  1 mg Injection Q Sun Poggi, Marshall Cork, MD      . sodium phosphate (FLEET) 7-19 GM/118ML enema 1 enema  1 enema Rectal Once PRN Poggi, Marshall Cork, MD      . traMADol Veatrice Bourbon) tablet 50 mg  50 mg Oral Q6H PRN Poggi, Marshall Cork, MD      . valACYclovir (VALTREX) tablet 500 mg  500 mg Oral BID PRN Poggi, Marshall Cork, MD      . vitamin B-12 (CYANOCOBALAMIN) tablet 1,000 mcg  1,000 mcg Oral Daily Poggi, Marshall Cork, MD         Discharge Medications: Please see discharge summary for a list of discharge medications.  Relevant Imaging Results:  Relevant Lab Results:   Additional Information SS# [id number]  Shelbie Ammons, RN

## 2020-07-22 NOTE — Progress Notes (Signed)
Subjective: 1 Day Post-Op Procedure(s) (LRB): TOTAL KNEE ARTHROPLASTY (Left) Patient reports pain as moderate.   Patient is well, and has had no acute complaints or problems PT and care management to assist with discharge planning, currently plan is for d/c to SNF based on PT yesterday. Negative for chest pain and shortness of breath Fever: no Gastrointestinal:NEgative for nausea and vomiting  Objective: Vital signs in last 24 hours: Temp:  [97 F (36.1 C)-98.8 F (37.1 C)] 98.8 F (37.1 C) (11/12 0741) Pulse Rate:  [67-87] 81 (11/12 0741) Resp:  [8-22] 15 (11/12 0741) BP: (112-150)/(46-99) 143/52 (11/12 0741) SpO2:  [89 %-99 %] 92 % (11/12 0741)  Intake/Output from previous day:  Intake/Output Summary (Last 24 hours) at 07/22/2020 0751 Last data filed at 07/22/2020 0733 Gross per 24 hour  Intake 1873.75 ml  Output 2150 ml  Net -276.25 ml    Intake/Output this shift: Total I/O In: -  Out: 200 [Urine:200]  Labs: Recent Labs    07/22/20 0606  HGB 11.5*   Recent Labs    07/22/20 0606  WBC 8.9  RBC 3.84*  HCT 34.8*  PLT 167   Recent Labs    07/22/20 0606  NA 136  K 4.3  CL 103  CO2 27  BUN 19  CREATININE 0.84  GLUCOSE 175*  CALCIUM 8.3*   No results for input(s): LABPT, INR in the last 72 hours.   EXAM General - Patient is Alert, Appropriate and Oriented Extremity - ABD soft Sensation intact distally Intact pulses distally Dorsiflexion/Plantar flexion intact Incision: dressing C/D/I No cellulitis present Dressing/Incision - clean, dry, no drainage Motor Function - intact, moving foot and toes well on exam.   Past Medical History:  Diagnosis Date  . Anginal pain ([place])   . Anxiety   . Arthritis    knees  . Barrett's esophagus   . Bleeds easily ([place])   . Breast cancer ([place]) 2014   RT LUMPECTOMY  . Bruises easily   . Cancer [facility]) 2014   right breast - s\p Radiation and lumpectomy and LN dissection  . Chronic sinusitis   . Complication  of anesthesia    hard to wake after Fentanyl  . COPD (chronic obstructive pulmonary disease) ([place]) 2012  . Coronary artery disease    4 STENTS  . Degenerative lumbar disc   . Diabetes mellitus without complication ([place])   . Dysphagia   . Dyspnea    WITH EXERTION  . Esophageal reflux   . Fatty liver   . History of Helicobacter pylori infection   . History of hiatal hernia   . Hypercholesteremia   . Hyperlipidemia   . Hypertension   . Personal history of radiation therapy   . Pneumonia   . Radiation 2014   RT BREAST CA  . Sleep apnea    NO CPAP-HAD A UPPP  . Ulcer, stomach peptic    history of  . Vertigo    last episode 2017(approx)    Assessment/Plan: 1 Day Post-Op Procedure(s) (LRB): TOTAL KNEE ARTHROPLASTY (Left) Active Problems:   Status post total knee replacement using cement, left  Estimated body mass index is 36.05 kg/m as calculated from the following:   Height as of this encounter: 5\' 4"  (1.626 m).   Weight as of this encounter: 95.3 kg. Advance diet Up with therapy D/C IV fluids   Labs reviewed this AM. Continue with therapy today. Care management to assist with discharge planning. Begin working on [employer], currently passing gas without  pain. Possible d/c home today depending on progress, may need rehab in which case she will likely need to stay admitted thru the weekend.  DVT Prophylaxis - Lovenox, Foot Pumps and TED hose Weight-Bearing as tolerated to left leg  J. Cameron Proud, PA-C [facility] Orthopa[facility] 07/22/2020, 7:51 AM

## 2020-07-22 NOTE — Progress Notes (Signed)
Physical Therapy Treatment Patient Details Name: Tammy Hatfield MRN: [id number] DOB: [birth year] Today's Date: 07/22/2020    History of Present Illness admitted for acute hospitalization s/p L TKR (07/21/20), WBAT.    PT Comments    Able to initiate gait training with RW, cga/min assist for safety; cuing for progression towards reciprocal stepping pattern, L TKE in loading phases of gait.  Patient pleased with progress; additional distance limited by pain.    Follow Up Recommendations  SNF     Equipment Recommendations  Rolling walker with 5" wheels;3in1 (PT)    Recommendations for Other Services       Precautions / Restrictions Precautions Precautions: Fall Restrictions Weight Bearing Restrictions: Yes LLE Weight Bearing: Weight bearing as tolerated    Mobility  Bed Mobility Overal bed mobility: Needs Assistance Bed Mobility: Supine to Sit     Supine to sit: Min assist     General bed mobility comments: assist for L LE management  Transfers Overall transfer level: Needs assistance Equipment used: Rolling walker (2 wheeled) Transfers: Sit to/from Stand Sit to Stand: Min guard         General transfer comment: cuing for hand placement to prevent pulling on RW  Ambulation/Gait Ambulation/Gait assistance: Min guard Gait Distance (Feet): 130 Feet Assistive device: Rolling walker (2 wheeled)       General Gait Details: step to progressing to progressive step through gait pattern; decreased stance time L LE, min cuing for L TKE in loading phases of gait cycle. Mild instability L knee in terminal stance   Stairs             Wheelchair Mobility    Modified Rankin (Stroke Patients Only)       Balance Overall balance assessment: Needs assistance Sitting-balance support: No upper extremity supported;Feet supported Sitting balance-Leahy Scale: Good     Standing balance support: Bilateral upper extremity supported Standing balance-Leahy  Scale: Fair                              Cognition Arousal/Alertness: Awake/alert Behavior During Therapy: WFL for tasks assessed/performed Overall Cognitive Status: Within Functional Limits for tasks assessed                                        Exercises Total Joint Exercises Goniometric ROM: L knee: 4-83 degrees, act assist Other Exercises Other Exercises: Seated LE therex, 1x12, act assist ROM: ankle pumps, LAQs, knee flexion    General Comments        Pertinent Vitals/Pain Pain Assessment: Faces Faces Pain Scale: Hurts even more Pain Location: L knee Pain Descriptors / Indicators: Aching;Guarding;Grimacing Pain Intervention(s): Limited activity within patient's tolerance;Monitored during session;Premedicated before session;Repositioned    Home Living                      Prior Function            PT Goals (current goals can now be found in the care plan section) Acute Rehab PT Goals Patient Stated Goal: to do what I have to do PT Goal Formulation: With patient Time For Goal Achievement: 08/04/20 Potential to Achieve Goals: Good Progress towards PT goals: Progressing toward goals    Frequency    BID      PT Plan Current plan remains appropriate    Co-evaluation  AM-PAC PT "6 Clicks" Mobility   Outcome Measure  Help needed turning from your back to your side while in a flat bed without using bedrails?: A Little Help needed moving from lying on your back to sitting on the side of a flat bed without using bedrails?: A Little Help needed moving to and from a bed to a chair (including a wheelchair)?: A Little Help needed standing up from a chair using your arms (e.g., wheelchair or bedside chair)?: A Little Help needed to walk in hospital room?: A Lot Help needed climbing 3-5 steps with a railing? : A Lot 6 Click Score: 16    End of Session Equipment Utilized During Treatment: Gait belt Activity  Tolerance: Patient tolerated treatment well Patient left: in chair;with call bell/phone within reach;with chair alarm set Nurse Communication: Mobility status PT Visit Diagnosis: Muscle weakness (generalized) (M62.81);Difficulty in walking, not elsewhere classified (R26.2);Pain Pain - Right/Left: Left Pain - part of body: Knee     Time: 0912-0945 PT Time Calculation (min) (ACUTE ONLY): 33 min  Charges:  $Gait Training: 8-22 mins $Therapeutic Exercise: 8-22 mins                      Lyndsy Gilberto H. Owens Shark, PT, DPT, NCS 07/22/20, 4:12 PM [phone]

## 2020-07-22 NOTE — Discharge Summary (Addendum)
Physician Discharge Summary  Patient ID: Tammy Hatfield MRN: [id number] DOB/AGE: 1946-08-16 74 y.o.  Admit date: 07/21/2020 Discharge date: 07/26/20  Admission Diagnoses:  Status post total knee replacement using cement, left [Z96.652]  Discharge Diagnoses: Patient Active Problem List   Diagnosis Date Noted  . Status post total knee replacement using cement, left 07/21/2020  . Benign essential hypertension 01/28/2020  . Breast cancer ([place]) 01/28/2020  . Lumbar disc disease 01/28/2020  . Pure hypercholesterolemia 01/28/2020  . Statin intolerance 11/25/2019  . SOB (shortness of breath) on exertion 09/17/2019  . Unstable angina ([place]) 09/16/2018  . Long-term insulin use ([place]) 07/08/2018  . Uncontrolled type 2 diabetes mellitus with hyperglycemia ([place]) 07/08/2018  . Obesity (BMI 35.0-39.9 without comorbidity) 06/12/2018  . Fissure, anal 08/23/2017  . History of Barrett's esophagus 08/23/2017  . Other constipation 08/23/2017  . Coronary artery disease of native artery of native heart with stable angina pectoris ([place]) 07/10/2017  . Coronary artery disease involving native coronary artery of native heart without angina pectoris 07/10/2017  . Chest pain 07/05/2017  . S/P drug eluting coronary stent placement 07/05/2017  . Primary osteoarthritis of right knee 04/15/2017  . Primary cancer of upper inner quadrant of right female breast ([place]) 12/31/2016  . Sinus congestion 11/13/2016  . Diverticulosis of large intestine without hemorrhage 07/03/2016  . Lateral epicondylitis of right elbow 11/10/2015  . Left knee pain 10/20/2015  . Primary osteoarthritis of left knee 04/28/2015  . Status post rotator cuff repair 03/10/2015  . CHF (congestive heart failure) ([place]) 02/11/2015  . OSA on CPAP 02/11/2015  . COPD (chronic obstructive pulmonary disease) ([place]) 02/11/2015  . Former smoker 02/11/2015  . HTN (hypertension) 02/11/2015  . Diabetes ([place]) 02/11/2015  . Rotator cuff rupture  02/11/2015  . Hyperlipidemia 02/11/2015  . GERD (gastroesophageal reflux disease) 02/11/2015  . PUD (peptic ulcer disease) 02/11/2015  . Dysphagia 02/11/2015  . ARF (acute renal failure) ([place]) 02/11/2015  . Impingement syndrome, shoulder, right 07/27/2014   Past Medical History:  Diagnosis Date  . Anginal pain ([place])   . Anxiety   . Arthritis    knees  . Barrett's esophagus   . Bleeds easily ([place])   . Breast cancer ([place]) 2014   RT LUMPECTOMY  . Bruises easily   . Cancer [facility]) 2014   right breast - s\p Radiation and lumpectomy and LN dissection  . Chronic sinusitis   . Complication of anesthesia    hard to wake after Fentanyl  . COPD (chronic obstructive pulmonary disease) ([place]) 2012  . Coronary artery disease    4 STENTS  . Degenerative lumbar disc   . Diabetes mellitus without complication ([place])   . Dysphagia   . Dyspnea    WITH EXERTION  . Esophageal reflux   . Fatty liver   . History of Helicobacter pylori infection   . History of hiatal hernia   . Hypercholesteremia   . Hyperlipidemia   . Hypertension   . Personal history of radiation therapy   . Pneumonia   . Radiation 2014   RT BREAST CA  . Sleep apnea    NO CPAP-HAD A UPPP  . Ulcer, stomach peptic    history of  . Vertigo    last episode 2017(approx)   Transfusion: None.   Consultants (if any):   Discharged Condition: Improved  Hospital Course: Tammy Hatfield is an 74 y.o. female who was admitted 07/21/2020 with a diagnosis of primary osteoarthritis of the left knee and went to  the operating room on 07/21/2020 and underwent the above named procedures.    Surgeries: Procedure(s): TOTAL KNEE ARTHROPLASTY on 07/21/2020 Patient tolerated the surgery well. Taken to PACU where she was stabilized and then transferred to the orthopedic floor.  Started on Lovenox 40mg  q 24 hrs. Foot pumps applied bilaterally at 80 mm. Heels elevated on bed with rolled towels. No evidence of DVT. Negative [place]. Physical  therapy started on day #1 for gait training and transfer. OT started day #1 for ADL and assisted devices.  Patient's IV was d/c on POD2.  Implants: Left TKA using all-cemented [employer] Vanguard system with a 65 mm PCR femur, a 71 mm tibial tray with a 10 mm anterior stabilized E-poly insert, and a 34 x 8.5 mm all-poly 3-pegged domed patella.  She was given perioperative antibiotics:  Anti-infectives (From admission, onward)   Start     Dose/Rate Route Frequency Ordered Stop   07/21/20 1345  ceFAZolin (ANCEF) IVPB 2g/100 mL premix        2 g 200 mL/hr over 30 Minutes Intravenous Every 6 hours 07/21/20 1142 07/22/20 0242   07/21/20 1143  valACYclovir (VALTREX) tablet 500 mg        500 mg Oral 2 times daily PRN 07/21/20 1143     07/21/20 0615  ceFAZolin (ANCEF) IVPB 2g/100 mL premix        2 g 200 mL/hr over 30 Minutes Intravenous  Once 07/21/20 0604 07/21/20 0813    .  She was given sequential compression devices, early ambulation, and Lovenox for DVT prophylaxis.  She benefited maximally from the hospital stay and there were no complications.    Recent vital signs:  Vitals:   07/26/20 0744 07/26/20 1155  BP: (!) 156/58 (!) 154/62  Pulse: 80 85  Resp: 17 16  Temp: 98.2 F (36.8 C) 97.8 F (36.6 C)  SpO2: 99% 96%    Recent laboratory studies:  Lab Results  Component Value Date   HGB 11.0 (L) 07/23/2020   HGB 11.5 (L) 07/22/2020   HGB 13.2 07/13/2020   Lab Results  Component Value Date   WBC 9.2 07/23/2020   PLT 159 07/23/2020   Lab Results  Component Value Date   INR 1.07 02/13/2015   Lab Results  Component Value Date   NA 126 (L) 07/24/2020   K 4.1 07/24/2020   CL 96 (L) 07/24/2020   CO2 23 07/24/2020   BUN 48 (H) 07/24/2020   CREATININE 1.00 07/24/2020   GLUCOSE 116 (H) 07/24/2020   Discharge Medications:   Allergies as of 07/26/2020      Reactions   Avelox [moxifloxacin] Other (See Comments)   debilitating muscle pain   Donepezil Other (See Comments)    Patient has severe nightmares    Amoxicillin Other (See Comments)   Yeast infection - tolerated cefazolin before DID THE REACTION INVOLVE: Swelling of the face/tongue/throat, SOB, or low BP? No Sudden or severe rash/hives, skin peeling, or the inside of the mouth or nose? No Did it require medical treatment? No When did it last happen?within the past 10 years If all above answers are "NO", may proceed with cephalosporin use.   Benazepril Cough   Bupropion    Dizziness, Headache   Byetta 10 Mcg Pen [exenatide] Diarrhea   Isosorbide Swelling   Headaches, issues with balance   Metformin Diarrhea   Neurontin [gabapentin] Other (See Comments)   Mouth blisters, joint pain, depression   Pantoprazole     Abdominal Pain  Sulfa Antibiotics Other (See Comments)   Headache   Ceclor [cefaclor] Rash   Tolerated cefazolin before.      Medication List    TAKE these medications   aspirin EC 81 MG tablet Take 81 mg by mouth daily.   calcium carbonate 500 MG chewable tablet Commonly known as: TUMS - dosed in mg elemental calcium Chew 1,500 mg by mouth daily as needed for indigestion or heartburn.   cyanocobalamin 1000 MCG tablet Take 1,000 mcg by mouth daily.   enoxaparin 40 MG/0.4ML injection Commonly known as: LOVENOX Inject 0.4 mLs (40 mg total) into the skin daily.   fexofenadine 180 MG tablet Commonly known as: ALLEGRA Take 180 mg by mouth daily as needed for allergies.   ibuprofen 200 MG tablet Commonly known as: ADVIL Take 400 mg by mouth daily as needed for headache or moderate pain.   insulin aspart 100 UNIT/ML injection Commonly known as: novoLOG Inject 3 Units into the skin 3 (three) times daily with meals.   isosorbide mononitrate 30 MG 24 hr tablet Commonly known as: IMDUR Take 30 mg by mouth every morning.   losartan 50 MG tablet Commonly known as: COZAAR Take 50 mg by mouth every morning.   LUBRICATING EYE DROPS OP Place 1 drop into both eyes daily  as needed (dry eyes).   metoprolol succinate 50 MG 24 hr tablet Commonly known as: TOPROL-XL Take 50 mg by mouth every morning. Take with or immediately following a meal.   nitroGLYCERIN 0.4 MG SL tablet Commonly known as: NITROSTAT Place 1 tablet (0.4 mg total) under the tongue every 5 (five) minutes as needed for chest pain.   ondansetron 4 MG tablet Commonly known as: ZOFRAN Take 1 tablet (4 mg total) by mouth every 6 (six) hours as needed for nausea.   OneTouch Verio test strip Generic drug: glucose blood 1 each 3 (three) times daily.   oxyCODONE 5 MG immediate release tablet Commonly known as: Oxy IR/ROXICODONE Take 1-2 tablets (5-10 mg total) by mouth every 4 (four) hours as needed for moderate pain.   pantoprazole 20 MG tablet Commonly known as: PROTONIX Take 20 mg by mouth every morning.   PARoxetine 20 MG tablet Commonly known as: PAXIL Take 20 mg by mouth every morning.   pregabalin 75 MG capsule Commonly known as: LYRICA Take 1 capsule (75 mg total) by mouth 2 (two) times daily.   Semaglutide(0.25 or 0.5MG /DOS) 2 MG/1.5ML Sopn Inject 1 mg as directed every Sunday.   traMADol 50 MG tablet Commonly known as: ULTRAM Take 1 tablet (50 mg total) by mouth every 6 (six) hours as needed for moderate pain. What changed: how much to take   Tresiba FlexTouch 200 UNIT/ML FlexTouch Pen Generic drug: insulin degludec Inject 66 Units into the skin at bedtime.   valACYclovir 500 MG tablet Commonly known as: VALTREX Take 500 mg by mouth 2 (two) times daily as needed (fever blisters).   Vitamin D3 25 MCG (1000 UT) Caps Take 1,000 Units by mouth daily.            Durable Medical Equipment  (From admission, onward)         Start     Ordered   07/21/20 1143  DME Bedside commode  Once       Question:  Patient needs a bedside commode to treat with the following condition  Answer:  Status post total knee replacement using cement, left   07/21/20 1142   07/21/20  1143  DME  3 n 1  Once        07/21/20 1142   07/21/20 1143  DME Walker rolling  Once       Question Answer Comment  Walker: With 5 Inch Wheels   Patient needs a walker to treat with the following condition Status post total knee replacement using cement, left      07/21/20 1142         Diagnostic Studies: DG Lumbar Spine 2-3 Views  Result Date: 07/23/2020 CLINICAL DATA:  74 year old female with low back pain EXAM: LUMBAR SPINE - 2-3 VIEW COMPARISON:  MRI lumbar spine 07/07/2020 FINDINGS: No evidence of acute fracture. The vertebral body heights are maintained. Multilevel degenerative disc disease is again noted and better evaluated on the relatively recent prior MRI. Significant loss of disc space height at L3-L4 and L5-S1 with more moderate loss of disc space height at L4-L5. Bilateral facet arthropathy throughout the mid and lower lumbar spine. Atherosclerotic calcifications visualized in the abdominal aorta without evidence of aneurysm. No lytic or blastic osseous lesion. Mild levoconvex scoliosis with the apex at L3. Surgical clips in the right upper quadrant suggest prior cholecystectomy. IMPRESSION: 1. No evidence of acute fracture or malalignment. 2. Multilevel degenerative disc disease and facet arthropathy as seen on recent prior lumbar spine MRI. 3. Mild levoconvex scoliosis, likely degenerative in nature. Electronically Signed   By: Jacqulynn Cadet M.D.   On: 07/23/2020 10:07   MR LUMBAR SPINE WO CONTRAST  Result Date: 07/07/2020 CLINICAL DATA:  Lower back and left buttock pain. EXAM: MRI LUMBAR SPINE WITHOUT CONTRAST TECHNIQUE: Multiplanar, multisequence MR imaging of the lumbar spine was performed. No intravenous contrast was administered. COMPARISON:  01/05/2020 CT abdomen pelvis. 02/11/2004 MRI lumbar spine report. FINDINGS: Segmentation:  Standard. Alignment: Straightening of lordosis. Minimal grade 1 L2-3 retrolisthesis. Vertebrae: Modic type 2 endplate degenerative changes  most prominent at the L3-4 level. No focal osseous lesion. Conus medullaris and cauda equina: Conus extends to the L1 level. Conus and cauda equina appear normal. Disc levels: Multilevel desiccation and disc space loss most prominent at the L3-4 level. L1-2: Mild disc bulge with superimposed left foraminal protrusion. Prominent ligamentum flavum. Bilateral facet hypertrophy. Patent spinal canal and neural foramen. L2-3: Disc bulge with shallow left foraminal protrusion. Superimposed inferiorly migrated right paracentral extrusion abutting the descending right L3 nerve root. Prominent ligamentum flavum. Bilateral facet hypertrophy. Mild spinal canal and bilateral neural foraminal narrowing. L3-4: Disc bulge with superimposed right paracentral, right subarticular and left foraminal protrusions. Partial effacement of the lateral recesses. Abutment of the descending left greater than right L4 nerve root. Bilateral facet hypertrophy. Mild spinal canal and moderate bilateral neural foraminal narrowing. L4-5: Disc bulge with superimposed left foraminal/extraforaminal protrusion. Partial effacement of the left lateral recess. Bilateral facet hypertrophy. Patent spinal canal. Mild right and moderate left neural foraminal narrowing. L5-S1: Disc bulge and bilateral facet hypertrophy. Superimposed left paracentral protrusion/annular fissuring abutting the descending left S1 nerve root. Patent spinal canal. Moderate left and mild right neural foraminal narrowing. Paraspinal and other soft tissues: Negative. IMPRESSION: Inferiorly migrated right L2-3 paracentral extrusion abutting the descending right L3 nerve root with mild spinal canal and bilateral neural foraminal narrowing. Moderate bilateral L3-4 and left L4-5, L5-S1 neural foraminal narrowing. Left L5-S1 paracentral protrusion/annular fissuring abutting the descending left S1 nerve root. Electronically Signed   By: Primitivo Gauze M.D.   On: 07/07/2020 15:26   DG  Knee Left Port  Result Date: 07/21/2020 CLINICAL DATA:  74 year old female with  total knee arthroplasty EXAM: PORTABLE LEFT KNEE - 1-2 VIEW COMPARISON:  None. FINDINGS: Surgical changes of left knee arthroplasty. Surgical staples in the overlying soft tissues. Gas within the surgical bed. No acute fracture. Components are aligned. No radiopaque foreign body. IMPRESSION: Early surgical changes of left knee arthroplasty without complicating features Electronically Signed   By: Corrie Mckusick D.O.   On: 07/21/2020 10:36   MM 3D SCREEN BREAST BILATERAL  Result Date: 07/14/2020 CLINICAL DATA:  Screening. EXAM: DIGITAL SCREENING BILATERAL MAMMOGRAM WITH TOMO AND CAD COMPARISON:  Previous exam(s). ACR Breast Density Category b: There are scattered areas of fibroglandular density. FINDINGS: There are no findings suspicious for malignancy. Images were processed with CAD. IMPRESSION: No mammographic evidence of malignancy. A result letter of this screening mammogram will be mailed directly to the patient. RECOMMENDATION: Screening mammogram in one year. (Code:SM-B-01Y) BI-RADS CATEGORY  1: Negative. Electronically Signed   By: Lajean Manes M.D.   On: 07/14/2020 13:53   VAS [place] LOWER EXTREMITY VENOUS REFLUX  Result Date: 07/01/2020  Lower Venous Reflux Study Indications: Pain, Swelling, and varicosities.  Risk Factors: Surgery HX: Left GSV ablation. Comparison Study: 09/24/2017 Performing Technologist: Charlane Ferretti RT (R)(VS)  Examination Guidelines: A complete evaluation includes B-mode imaging, spectral Doppler, color Doppler, and power Doppler as needed of all accessible portions of each vessel. Bilateral testing is considered an integral part of a complete examination. Limited examinations for reoccurring indications may be performed as noted. The reflux portion of the exam is performed with the patient in reverse Trendelenburg. Significant venous reflux is defined as >500 ms in the superficial venous system,  and >1 second in the deep venous system. Venous Reflux Times +--------------+---------+----------+-----------+------------+--------+ RIGHT         Reflux NoReflux YesReflux TimeDiameter cmsComments +--------------+---------+----------+-----------+------------+--------+ CFV           no                                                 +--------------+---------+----------+-----------+------------+--------+ FV mid        no                                                 +--------------+---------+----------+-----------+------------+--------+ Popliteal     no                                                 +--------------+---------+----------+-----------+------------+--------+ GSV at SFJ    no                                .60              +--------------+---------+----------+-----------+------------+--------+ GSV prox thigh            yes      1753 ma      .61              +--------------+---------+----------+-----------+------------+--------+ GSV mid thigh             yes      1680 ms      .  63              +--------------+---------+----------+-----------+------------+--------+ GSV dist thigh            yes      2274 ms      .53              +--------------+---------+----------+-----------+------------+--------+ GSV at knee               yes      3161 ms      .41              +--------------+---------+----------+-----------+------------+--------+ GSV prox calf                                   .32              +--------------+---------+----------+-----------+------------+--------+ SSV Pop Fossa             yes      4526 ms      .31              +--------------+---------+----------+-----------+------------+--------+  +--------------+---------+----------+--------+------------+--------------------+ LEFT          Reflux NoReflux Yes Reflux Diameter cmsComments                                                Time                                    +--------------+---------+----------+--------+------------+--------------------+ CFV           no                                                          +--------------+---------+----------+--------+------------+--------------------+ FV mid        no                                                          +--------------+---------+----------+--------+------------+--------------------+ Popliteal     no                                                          +--------------+---------+----------+--------+------------+--------------------+ GSV at SFJ                yes    1636 ms     .77                          +--------------+---------+----------+--------+------------+--------------------+ GSV prox thigh                                       prior  ablation/stripping   +--------------+---------+----------+--------+------------+--------------------+ GSV mid thigh                                        prior                                                                     ablation/stripping   +--------------+---------+----------+--------+------------+--------------------+ GSV dist thigh                                       prior                                                                     ablation/stripping   +--------------+---------+----------+--------+------------+--------------------+ GSV at knee                                          prior                                                                     ablation/stripping   +--------------+---------+----------+--------+------------+--------------------+ SSV Pop Fossa no                             .34                          +--------------+---------+----------+--------+------------+--------------------+  Summary: Bilateral: - No evidence of deep vein thrombosis seen in the  lower extremities, bilaterally, from the common femoral through the popliteal veins. - No evidence of deep venous insufficiency seen bilaterally in the lower extremity.  Right: - Venous reflux is noted in the right greater saphenous vein in the thigh. - Venous reflux is noted in the right short saphenous vein.  Left: - Venous reflux is noted in the left sapheno-femoral junction.  *See table(s) above for measurements and observations. Electronically signed by Leotis Pain MD on 07/01/2020 at 12:20:53 PM.    Final    Disposition:  Plan for discharge to SNF today pending a bowel movement and approval from insurance and bed placement.   Contact information for follow-up providers    Reche Dixon, PA-C Follow up in 14 day(s).   Specialty: Orthopedic Surgery Why: Staple removal. Contact information: [address] [place] [address] [phone]            Contact information for after-discharge care    Destination    [facility] Preferred SNF .  Service: Skilled Nursing Contact information: [address] [place] [phone]                 Signed: Judson Roch PA-C 07/26/2020, 1:47 PM

## 2020-07-22 NOTE — Progress Notes (Signed)
Pt was offered IV pain medications r/t pain level, however, pt refused wanted oral.

## 2020-07-22 NOTE — Discharge Instructions (Signed)
Diet: As you were doing prior to hospitalization   Shower:  May shower but keep the wounds dry, use an occlusive plastic wrap, NO SOAKING IN TUB.  If the bandage gets wet, change with a clean dry gauze.  Dressing:  You may change your dressing as needed. Change the dressing with sterile gauze dressing.    Activity:  Increase activity slowly as tolerated, but follow the weight bearing instructions below.  No lifting or driving for 6 weeks.  Weight Bearing:   Weight bearing as tolerated to left lower extremity  To prevent constipation: you may use a stool softener such as -  Colace (over the counter) 100 mg by mouth twice a day  Drink plenty of fluids (prune juice may be helpful) and high fiber foods Miralax (over the counter) for constipation as needed.    Itching:  If you experience itching with your medications, try taking only a single pain pill, or even half a pain pill at a time.  You may take up to 10 pain pills per day, and you can also use benadryl over the counter for itching or also to help with sleep.   Precautions:  If you experience chest pain or shortness of breath - call 911 immediately for transfer to the hospital emergency department!!  If you develop a fever greater that 101 F, purulent drainage from wound, increased redness or drainage from wound, or calf pain-Call [place]                                              Follow- Up Appointment:  Please call for an appointment to be seen in 2 weeks at [facility]

## 2020-07-22 NOTE — Progress Notes (Signed)
Physical Therapy Treatment Patient Details Name: Tammy Hatfield MRN: [id number] DOB: July 31, 1946 Today's Date: 07/22/2020    History of Present Illness admitted for acute hospitalization s/p L TKR (07/21/20), WBAT.    PT Comments    Completing transfers with improved carry-over of technique, less physical assist this date.  Progressive increase in gait distance, completing full lap around nursing station (200') with cga/close sup.  Very slow and deliberate, but mechanics improving with distance.  Continues to endorse significant pain with initial movement transitions during sessions; anticipate continued improvement with consistent pain control.    Follow Up Recommendations  SNF     Equipment Recommendations  Rolling walker with 5" wheels;3in1 (PT)    Recommendations for Other Services       Precautions / Restrictions Precautions Precautions: Fall Restrictions Weight Bearing Restrictions: Yes LLE Weight Bearing: Weight bearing as tolerated    Mobility  Bed Mobility Overal bed mobility: Needs Assistance Bed Mobility: Supine to Sit;Sit to Supine     Supine to sit: Min assist Sit to supine: Min assist   General bed mobility comments: difficulty with active elevation of L LE with bed mobility  Transfers Overall transfer level: Needs assistance Equipment used: Rolling walker (2 wheeled) Transfers: Sit to/from Stand Sit to Stand: Min guard         General transfer comment: cuing for hand placement to prevent pulling on RW  Ambulation/Gait Ambulation/Gait assistance: Min guard;Supervision Gait Distance (Feet): 200 Feet Assistive device: Rolling walker (2 wheeled)       General Gait Details: continued progression towards reciprocal stepping pattern; cuing for postural extension, walker placement/advancement and continuous stepping pattern as able.  Slow and guarded, but no overt buckling or LOB; improving L knee control/stability in loading.   Stairs              Wheelchair Mobility    Modified Rankin (Stroke Patients Only)       Balance Overall balance assessment: Needs assistance Sitting-balance support: No upper extremity supported;Feet supported Sitting balance-Leahy Scale: Good     Standing balance support: Bilateral upper extremity supported Standing balance-Leahy Scale: Fair                              Cognition Arousal/Alertness: Awake/alert Behavior During Therapy: WFL for tasks assessed/performed Overall Cognitive Status: Within Functional Limits for tasks assessed                                        Exercises Total Joint Exercises Goniometric ROM: L knee: 4-83 degrees, act assist Other Exercises Other Exercises: Toilet transfer, SPT with RW, cga/min assist; sit/stand from [facility] with RW, cga/min assist.  Standing balance for hygiene, cga/close sup with RW. Other Exercises: Patient requesting to don personal undergarments, requiring min/mod assist to thread over LEs.  Reviewed appropriate dressing techniques, min assist to integrate.  Sit/stand and standing balance from edge of bed, cga/clsoe sup with RW, for pulling briefs.    General Comments        Pertinent Vitals/Pain Pain Assessment: Faces Faces Pain Scale: Hurts even more Pain Location: L knee Pain Descriptors / Indicators: Aching;Guarding;Grimacing Pain Intervention(s): Limited activity within patient's tolerance;Monitored during session;Premedicated before session;Repositioned    Home Living                      Prior  Function            PT Goals (current goals can now be found in the care plan section) Acute Rehab PT Goals Patient Stated Goal: to do what I have to do PT Goal Formulation: With patient Time For Goal Achievement: 08/04/20 Potential to Achieve Goals: Good Progress towards PT goals: Progressing toward goals    Frequency    BID      PT Plan Current plan remains appropriate     Co-evaluation              AM-PAC PT "6 Clicks" Mobility   Outcome Measure  Help needed turning from your back to your side while in a flat bed without using bedrails?: A Little Help needed moving from lying on your back to sitting on the side of a flat bed without using bedrails?: A Little Help needed moving to and from a bed to a chair (including a wheelchair)?: A Little Help needed standing up from a chair using your arms (e.g., wheelchair or bedside chair)?: A Little Help needed to walk in hospital room?: A Little Help needed climbing 3-5 steps with a railing? : A Lot 6 Click Score: 17    End of Session Equipment Utilized During Treatment: Gait belt Activity Tolerance: Patient tolerated treatment well Patient left: in chair;with call bell/phone within reach;with chair alarm set Nurse Communication: Mobility status PT Visit Diagnosis: Muscle weakness (generalized) (M62.81);Difficulty in walking, not elsewhere classified (R26.2);Pain Pain - Right/Left: Left Pain - part of body: Knee     Time: 5400-8676 PT Time Calculation (min) (ACUTE ONLY): 43 min  Charges:  $Gait Training: 8-22 mins $Therapeutic Exercise: 8-22 mins $Therapeutic Activity: 23-37 mins                     Jimmie Rueter H. Owens Shark, PT, DPT, NCS 07/22/20, 4:31 PM [phone]

## 2020-07-22 NOTE — TOC Initial Note (Signed)
Transition of Care [facility]) - Initial/Assessment Note    Patient Details  Name: Tammy Hatfield MRN: [id number] Date of Birth: May 15, 1946  [facility] CM/SW Contact:    Shelbie Ammons, RN Phone Number: 07/22/2020, 8:37 AM  Clinical Narrative:     RNCM met with patient in room, patient sitting up having just finished with morning medications. Patient reports to feeling some better today after left total knee replacement yesterday. Patient is normally independent at home, she lives in a single level apartment. Patient reports that it is her plan to go to SNF at discharge. She reports that she does not have any help in the home, her apartment is small and she thinks she will need that extra help for a little while. Patient is agreeable to bed search and only requests that it not be [facility] or [facility] due to her late husband having been there. RNCM verified PASSR, completed FL-2 and sent bed request.              Expected Discharge Plan: Skilled Nursing Facility Barriers to Discharge: No Barriers Identified   Patient Goals and CMS Choice        Expected Discharge Plan and Services Expected Discharge Plan: [place] Acute Care Choice: Nursing Home Living arrangements for the past 2 months: Apartment                                      Prior Living Arrangements/Services Living arrangements for the past 2 months: Apartment Lives with:: Self Patient language and need for interpreter reviewed:: Yes Do you feel safe going back to the place where you live?: Yes      Need for Family Participation in Patient Care: Yes (Comment) Care giver support system in place?: Yes (comment)   Criminal Activity/Legal Involvement Pertinent to Current Situation/Hospitalization: No - Comment as needed  Activities of Daily Living Home Assistive Devices/Equipment: Eyeglasses, Cane (specify quad or straight), Walker (specify type) ADL Screening (condition at  time of admission) Patient's cognitive ability adequate to safely complete daily activities?: Yes Is the patient deaf or have difficulty hearing?: No Does the patient have difficulty seeing, even when wearing glasses/contacts?: No Does the patient have difficulty concentrating, remembering, or making decisions?: No Patient able to express need for assistance with ADLs?: Yes Does the patient have difficulty dressing or bathing?: No Independently performs ADLs?: Yes (appropriate for developmental age) Does the patient have difficulty walking or climbing stairs?: Yes Weakness of Legs: None Weakness of Arms/Hands: None  Permission Sought/Granted                  Emotional Assessment Appearance:: Appears stated age Attitude/Demeanor/Rapport: Engaged Affect (typically observed): Appropriate, Calm Orientation: : Oriented to Self, Oriented to Place, Oriented to  Time, Oriented to Situation Alcohol / Substance Use: Not Applicable Psych Involvement: No (comment)  Admission diagnosis:  Status post total knee replacement using cement, left [Z96.652] Patient Active Problem List   Diagnosis Date Noted  . Status post total knee replacement using cement, left 07/21/2020  . Benign essential hypertension 01/28/2020  . Breast cancer ([place]) 01/28/2020  . Lumbar disc disease 01/28/2020  . Pure hypercholesterolemia 01/28/2020  . Statin intolerance 11/25/2019  . SOB (shortness of breath) on exertion 09/17/2019  . Unstable angina ([place]) 09/16/2018  . Long-term insulin use ([place]) 07/08/2018  . Uncontrolled type 2 diabetes mellitus  with hyperglycemia ([place]) 07/08/2018  . Obesity (BMI 35.0-39.9 without comorbidity) 06/12/2018  . Fissure, anal 08/23/2017  . History of Barrett's esophagus 08/23/2017  . Other constipation 08/23/2017  . Coronary artery disease of native artery of native heart with stable angina pectoris ([place]) 07/10/2017  . Coronary artery disease involving native coronary artery of native  heart without angina pectoris 07/10/2017  . Chest pain 07/05/2017  . S/P drug eluting coronary stent placement 07/05/2017  . Primary osteoarthritis of right knee 04/15/2017  . Primary cancer of upper inner quadrant of right female breast ([place]) 12/31/2016  . Sinus congestion 11/13/2016  . Diverticulosis of large intestine without hemorrhage 07/03/2016  . Lateral epicondylitis of right elbow 11/10/2015  . Left knee pain 10/20/2015  . Primary osteoarthritis of left knee 04/28/2015  . Status post rotator cuff repair 03/10/2015  . CHF (congestive heart failure) ([place]) 02/11/2015  . OSA on CPAP 02/11/2015  . COPD (chronic obstructive pulmonary disease) ([place]) 02/11/2015  . Former smoker 02/11/2015  . HTN (hypertension) 02/11/2015  . Diabetes ([place]) 02/11/2015  . Rotator cuff rupture 02/11/2015  . Hyperlipidemia 02/11/2015  . GERD (gastroesophageal reflux disease) 02/11/2015  . PUD (peptic ulcer disease) 02/11/2015  . Dysphagia 02/11/2015  . ARF (acute renal failure) ([place]) 02/11/2015  . Impingement syndrome, shoulder, right 07/27/2014   PCP:  Baxter Hire, MD Pharmacy:   [facility] [address] [place] - [place] [address] [place] [address] Phone: [phone] Fax: [phone]     Social Determinants of Health (SDOH) Interventions    Readmission Risk Interventions No flowsheet data found.

## 2020-07-23 ENCOUNTER — Inpatient Hospital Stay: Payer: Medicare HMO

## 2020-07-23 LAB — CBC
HCT: 32.8 % — ABNORMAL LOW (ref 36.0–46.0)
Hemoglobin: 11 g/dL — ABNORMAL LOW (ref 12.0–15.0)
MCH: 29.9 pg (ref 26.0–34.0)
MCHC: 33.5 g/dL (ref 30.0–36.0)
MCV: 89.1 fL (ref 80.0–100.0)
Platelets: 159 10*3/uL (ref 150–400)
RBC: 3.68 MIL/uL — ABNORMAL LOW (ref 3.87–5.11)
RDW: 13.1 % (ref 11.5–15.5)
WBC: 9.2 10*3/uL (ref 4.0–10.5)
nRBC: 0 % (ref 0.0–0.2)

## 2020-07-23 LAB — BASIC METABOLIC PANEL
Anion gap: 8 (ref 5–15)
BUN: 11 mg/dL (ref 8–23)
CO2: 29 mmol/L (ref 22–32)
Calcium: 8.5 mg/dL — ABNORMAL LOW (ref 8.9–10.3)
Chloride: 94 mmol/L — ABNORMAL LOW (ref 98–111)
Creatinine, Ser: 0.74 mg/dL (ref 0.44–1.00)
GFR, Estimated: >60 mL/min (ref >60)
Glucose, Bld: 187 mg/dL — ABNORMAL HIGH (ref 70–99)
Potassium: 4 mmol/L (ref 3.5–5.1)
Sodium: 131 mmol/L — ABNORMAL LOW (ref 135–145)

## 2020-07-23 LAB — GLUCOSE, CAPILLARY
Glucose-Capillary: 180 mg/dL — ABNORMAL HIGH (ref 70–99)
Glucose-Capillary: 192 mg/dL — ABNORMAL HIGH (ref 70–99)
Glucose-Capillary: 225 mg/dL — ABNORMAL HIGH (ref 70–99)
Glucose-Capillary: 192 mg/dL — ABNORMAL HIGH (ref 70–99)

## 2020-07-23 MED ORDER — PREGABALIN 75 MG PO CAPS
75.0000 mg | ORAL_CAPSULE | Freq: Two times a day (BID) | ORAL | Status: DC
  Administered 2020-07-23 – 2020-07-26 (×7): 75 mg via ORAL
  Filled 2020-07-23 (×7): qty 1

## 2020-07-23 MED ORDER — HYDROMORPHONE HCL 1 MG/ML IJ SOLN
0.2500 mg | INTRAMUSCULAR | Status: DC | PRN
  Administered 2020-07-23: 0.5 mg via INTRAVENOUS
  Filled 2020-07-23: qty 1

## 2020-07-23 MED ORDER — OXYCODONE HCL 5 MG PO TABS
15.0000 mg | ORAL_TABLET | ORAL | Status: DC | PRN
  Administered 2020-07-23: 15 mg via ORAL
  Filled 2020-07-23: qty 3

## 2020-07-23 MED ORDER — TRAMADOL HCL 50 MG PO TABS
100.0000 mg | ORAL_TABLET | Freq: Four times a day (QID) | ORAL | Status: DC
  Administered 2020-07-23: 100 mg via ORAL
  Filled 2020-07-23: qty 2

## 2020-07-23 MED ORDER — METHOCARBAMOL 500 MG PO TABS
750.0000 mg | ORAL_TABLET | Freq: Four times a day (QID) | ORAL | Status: DC | PRN
  Administered 2020-07-23 – 2020-07-25 (×2): 750 mg via ORAL
  Filled 2020-07-23 (×2): qty 2

## 2020-07-23 MED ORDER — OXYCODONE HCL 5 MG PO TABS
5.0000 mg | ORAL_TABLET | ORAL | Status: DC | PRN
  Administered 2020-07-23 – 2020-07-26 (×7): 5 mg via ORAL
  Filled 2020-07-23 (×7): qty 1

## 2020-07-23 NOTE — Progress Notes (Addendum)
  Subjective: 2 Days Post-Op Procedure(s) (LRB): TOTAL KNEE ARTHROPLASTY (Left) Patient reports pain as severe, primarily in the low back and leg.  She is doing somewhat better since receiving her pain medicine, but she was still unable to participate in PT  Plan is to go Skilled nursing facility after hospital stay. Negative for chest pain and shortness of breath Fever: no Gastrointestinal: negative for nausea and vomiting.  Patient has not had a bowel movement.  Objective: Vital signs in last 24 hours: Temp:  [97.9 F (36.6 C)-99.5 F (37.5 C)] 98.9 F (37.2 C) (11/13 1119) Pulse Rate:  [74-94] 79 (11/13 1119) Resp:  [17-20] 18 (11/13 1119) BP: (110-157)/(53-84) 130/63 (11/13 1119) SpO2:  [88 %-97 %] 95 % (11/13 1119)  Intake/Output from previous day:  Intake/Output Summary (Last 24 hours) at 07/23/2020 1136 Last data filed at 07/23/2020 1100 Gross per 24 hour  Intake 1179.85 ml  Output 550 ml  Net 629.85 ml    Intake/Output this shift: Total I/O In: 1179.9 [I.V.:1179.9] Out: 550 [Urine:550]  Labs: Recent Labs    07/22/20 0606 07/23/20 0547  HGB 11.5* 11.0*   Recent Labs    07/22/20 0606 07/23/20 0547  WBC 8.9 9.2  RBC 3.84* 3.68*  HCT 34.8* 32.8*  PLT 167 159   Recent Labs    07/22/20 0606 07/23/20 0547  NA 136 131*  K 4.3 4.0  CL 103 94*  CO2 27 29  BUN 19 11  CREATININE 0.84 0.74  GLUCOSE 175* 187*  CALCIUM 8.3* 8.5*   No results for input(s): LABPT, INR in the last 72 hours.   EXAM General - Patient is Alert, Appropriate and Oriented Extremity - Neurovascular intact Dorsiflexion/Plantar flexion intact Compartment soft Dressing/Incision -mild sanguinous drainage noted over honeycomb Motor Function - intact, moving foot and toes well on exam.  Gastrointestinal- soft, nontender and active bowel sounds   Assessment/Plan: 2 Days Post-Op Procedure(s) (LRB): TOTAL KNEE ARTHROPLASTY (Left) Active Problems:   Status post total knee  replacement using cement, left  Estimated body mass index is 36.05 kg/m as calculated from the following:   Height as of this encounter: 5\' 4"  (1.626 m).   Weight as of this encounter: 95.3 kg. Advance diet Up with therapy Discharge to SNF pending bed placement.   Message sent to [facility] regarding likely need for SNF placement.  Unfortunately, there are no additional suitable treatments for the patient's back pain at this time. Pain medicine has already been increased per Dr. Rudene Christians. She will likely benefit from ambulation.   DVT Prophylaxis - Lovenox, Ted hose and SCDs Weight-Bearing as tolerated to left leg  Cassell Smiles, PA-C [facility] 07/23/2020, 11:36 AM

## 2020-07-23 NOTE — Progress Notes (Signed)
Patient request pain med for pain level of 4 on a scale of 0-10. Oxy 5mg  given PO.

## 2020-07-23 NOTE — Progress Notes (Addendum)
Physical Therapy Treatment Patient Details Name: Tammy Hatfield MRN: [id number] DOB: 06/27/1946 Today's Date: 07/23/2020    History of Present Illness admitted for acute hospitalization s/p L TKR (07/21/20), WBAT.    PT Comments    Patient agrees to PT treatment; she reports she had a very bad night and has had pain medicine. She has 1/5 LLE hip flex strength. She is requiring max A with bed mobility supine to sit and sit to supine. She is not able to tolerate sitting due to back and L leg pain. She is sobbing into her washcloth and unable to participate in more mobility. Patient will continue to benefit from skilled PT to improve mobility and strength.   Follow Up Recommendations  SNF     Equipment Recommendations  Rolling walker with 5" wheels    Recommendations for Other Services       Precautions / Restrictions Precautions Precautions: Fall Restrictions Weight Bearing Restrictions: Yes LLE Weight Bearing: Weight bearing as tolerated    Mobility  Bed Mobility Overal bed mobility: Needs Assistance Bed Mobility: Supine to Sit;Sit to Supine     Supine to sit: Max assist Sit to supine: Max assist   General bed mobility comments: back and LLE are hurting; patient is sobbing and covering her face with a washcloth due to pain  Transfers Overall transfer level:  (unable)                  Ambulation/Gait Ambulation/Gait assistance:  (unable)               Stairs             Wheelchair Mobility    Modified Rankin (Stroke Patients Only)       Balance Overall balance assessment: Modified Independent Sitting-balance support: No upper extremity supported Sitting balance-Leahy Scale: Good     Standing balance support:  (NT)                                Cognition Arousal/Alertness: Awake/alert Behavior During Therapy: WFL for tasks assessed/performed Overall Cognitive Status: Within Functional Limits for tasks assessed                                         Exercises Total Joint Exercises Goniometric ROM: L knee 0-75 deg Other Exercises Other Exercises: Seated LE therex, 1x12, act assist ROM: ankle pumps, LAQs, knee flexion    General Comments        Pertinent Vitals/Pain Pain Assessment: 0-10 Faces Pain Scale: Hurts worst Pain Location: L knee Pain Descriptors / Indicators: Aching Pain Intervention(s): Limited activity within patient's tolerance    Home Living                      Prior Function            PT Goals (current goals can now be found in the care plan section) Acute Rehab PT Goals Patient Stated Goal: to do what I have to do PT Goal Formulation: With patient Time For Goal Achievement: 08/04/20 Potential to Achieve Goals: Good Progress towards PT goals: Progressing toward goals    Frequency    BID      PT Plan Current plan remains appropriate    Co-evaluation  AM-PAC PT "6 Clicks" Mobility   Outcome Measure  Help needed turning from your back to your side while in a flat bed without using bedrails?: Total Help needed moving from lying on your back to sitting on the side of a flat bed without using bedrails?: Total Help needed moving to and from a bed to a chair (including a wheelchair)?: Total Help needed standing up from a chair using your arms (e.g., wheelchair or bedside chair)?: Total Help needed to walk in hospital room?: Total Help needed climbing 3-5 steps with a railing? : Total 6 Click Score: 6    End of Session Equipment Utilized During Treatment: Gait belt Activity Tolerance: Patient limited by pain   Nurse Communication: Mobility status PT Visit Diagnosis: Muscle weakness (generalized) (M62.81);Difficulty in walking, not elsewhere classified (R26.2) Pain - Right/Left: Left Pain - part of body: Knee     Time: 7670-1100 PT Time Calculation (min) (ACUTE ONLY): 25 min  Charges:  $Therapeutic  Activity: 23-37 mins             Alanson Puls, PT DPT 07/23/2020, 10:57 AM

## 2020-07-23 NOTE — Progress Notes (Signed)
Patient's son is here and requesting patient to be on heart monitor with O2, states that patient does not do well with pain medication. Dr Rudene Christians on call, paged, waiting on callback.

## 2020-07-23 NOTE — Progress Notes (Signed)
PT Cancellation Note  Patient Details Name: Tammy Hatfield MRN: [id number] DOB: 1945/11/14   Cancelled Treatment:    Reason Eval/Treat Not Completed: Medical issues which prohibited therapy. Patient's son is here and requesting patient to be on heart monitor with O2, states that patient does not do well with pain medication. Dr Rudene Christians on call, paged, waiting on callback. Patient is sleepy and not able be awake enough to participate.    Alanson Puls, [place] DPT 07/23/2020, 3:55 PM

## 2020-07-24 LAB — BASIC METABOLIC PANEL
Anion gap: 7 (ref 5–15)
BUN: 48 mg/dL — ABNORMAL HIGH (ref 8–23)
CO2: 23 mmol/L (ref 22–32)
Calcium: 7.8 mg/dL — ABNORMAL LOW (ref 8.9–10.3)
Chloride: 96 mmol/L — ABNORMAL LOW (ref 98–111)
Creatinine, Ser: 1 mg/dL (ref 0.44–1.00)
GFR, Estimated: 59 mL/min — ABNORMAL LOW (ref >60)
Glucose, Bld: 116 mg/dL — ABNORMAL HIGH (ref 70–99)
Potassium: 4.1 mmol/L (ref 3.5–5.1)
Sodium: 126 mmol/L — ABNORMAL LOW (ref 135–145)

## 2020-07-24 LAB — GLUCOSE, CAPILLARY
Glucose-Capillary: 237 mg/dL — ABNORMAL HIGH (ref 70–99)
Glucose-Capillary: 200 mg/dL — ABNORMAL HIGH (ref 70–99)
Glucose-Capillary: 232 mg/dL — ABNORMAL HIGH (ref 70–99)
Glucose-Capillary: 226 mg/dL — ABNORMAL HIGH (ref 70–99)

## 2020-07-24 NOTE — Progress Notes (Signed)
  Subjective: 3 Days Post-Op Procedure(s) (LRB): TOTAL KNEE ARTHROPLASTY (Left) Patient reports pain as improved.  She is more talkative today. She was able to participate in bed PT earlier. Plan is to go Rehab after hospital stay. Negative for chest pain and shortness of breath Fever: no Gastrointestinal: negative for nausea and vomiting.  Patient has not had a bowel movement.  Objective: Vital signs in last 24 hours: Temp:  [98.1 F (36.7 C)-99 F (37.2 C)] 98.3 F (36.8 C) (11/14 0804) Pulse Rate:  [79-96] 96 (11/14 0804) Resp:  [16-18] 17 (11/14 0804) BP: (127-163)/(49-67) 127/49 (11/14 0804) SpO2:  [93 %-97 %] 96 % (11/14 0930)  Intake/Output from previous day:  Intake/Output Summary (Last 24 hours) at 07/24/2020 1052 Last data filed at 07/24/2020 0949 Gross per 24 hour  Intake 240 ml  Output 2750 ml  Net -2510 ml    Intake/Output this shift: Total I/O In: 240 [P.O.:240] Out: -   Labs: Recent Labs    07/22/20 0606 07/23/20 0547  HGB 11.5* 11.0*   Recent Labs    07/22/20 0606 07/23/20 0547  WBC 8.9 9.2  RBC 3.84* 3.68*  HCT 34.8* 32.8*  PLT 167 159   Recent Labs    07/23/20 0547 07/24/20 0430  NA 131* 126*  K 4.0 4.1  CL 94* 96*  CO2 29 23  BUN 11 48*  CREATININE 0.74 1.00  GLUCOSE 187* 116*  CALCIUM 8.5* 7.8*   No results for input(s): LABPT, INR in the last 72 hours.   EXAM General - Patient is Alert, Appropriate and Oriented Extremity - Neurovascular intact Dorsiflexion/Plantar flexion intact Compartment soft Dressing/Incision -minimal dried sanguinous drainage noted over honeycomb Motor Function - intact, moving foot and toes well on exam.  Cardiovascular- Regular rate and rhythm, no murmurs/rubs/gallops Respiratory- Lungs clear to auscultation bilaterally Gastrointestinal- soft, nontender and active bowel sounds   Assessment/Plan: 3 Days Post-Op Procedure(s) (LRB): TOTAL KNEE ARTHROPLASTY (Left) Active Problems:   Status post  total knee replacement using cement, left  Estimated body mass index is 36.05 kg/m as calculated from the following:   Height as of this encounter: 5\' 4"  (1.626 m).   Weight as of this encounter: 95.3 kg. Advance diet Up with therapy Discharge to SNF pending insurance approval and bed placement.  TED hose placed on operative leg.  Patient does need to have a BM prior to d/c. Spoke to nursing about giving milk of magnesia.  DVT Prophylaxis - Lovenox, Ted hose and SCDs Weight-Bearing as tolerated to left leg  Cassell Smiles, PA-C [facility] 07/24/2020, 10:52 AM

## 2020-07-24 NOTE — Progress Notes (Signed)
Physical Therapy Treatment Patient Details Name: Tammy Hatfield MRN: [id number] DOB: 08-Jul-1946 Today's Date: 07/24/2020    History of Present Illness admitted for acute hospitalization s/p L TKR (07/21/20), WBAT.    PT Comments    Pt reported feeling a bit better today.  Participated in exercises as described below.  Remains very guareded with ROM ex allowing only minimal ROM due to pain.  To EOB with max a x 1 for LE management and assist for upper body despite HOB being raised.  Slow with frequent rest breaks during transition. Once seated EOB she is eventually able to gain her balance with BLE and BUE support.  She is able to tolerate sitting for 15 minutes with only minimal LAQ with AA/PROM.  She does not feel as she can stand or transfer to chair today due to pain.  Lateral scooting in sitting towards HOB with max a x 1.  Returned to supine with Max a x 1 per her request due to fatigue.  SNF remains appropriate.   Follow Up Recommendations  SNF     Equipment Recommendations  Rolling walker with 5" wheels    Recommendations for Other Services       Precautions / Restrictions Precautions Precautions: Fall Restrictions Weight Bearing Restrictions: Yes LLE Weight Bearing: Weight bearing as tolerated    Mobility  Bed Mobility Overal bed mobility: Needs Assistance Bed Mobility: Supine to Sit;Sit to Supine     Supine to sit: Mod assist Sit to supine: Max assist   General bed mobility comments: assist for all aspects of mobility  Transfers                 General transfer comment: declined trying due to pain  Ambulation/Gait                 Stairs             Wheelchair Mobility    Modified Rankin (Stroke Patients Only)       Balance Overall balance assessment: Needs assistance Sitting-balance support: Feet supported;Bilateral upper extremity supported Sitting balance-Leahy Scale: Fair Sitting balance - Comments: guarded balance due  to pain                                    Cognition Arousal/Alertness: Awake/alert Behavior During Therapy: WFL for tasks assessed/performed Overall Cognitive Status: Within Functional Limits for tasks assessed                                        Exercises Total Joint Exercises Goniometric ROM: 0-75 - limited by pain Other Exercises Other Exercises: Seated LE therex, 1x12, act assist ROM: ankle pumps, LAQs, knee flexion    General Comments        Pertinent Vitals/Pain Pain Assessment: 0-10 Pain Score: 8  Pain Location: L knee Pain Descriptors / Indicators: Aching;Sore;Guarding Pain Intervention(s): Limited activity within patient's tolerance;Monitored during session;Premedicated before session;Repositioned;Ice applied    Home Living                      Prior Function            PT Goals (current goals can now be found in the care plan section) Progress towards PT goals: Not progressing toward goals - comment    Frequency  BID      PT Plan      Co-evaluation              AM-PAC PT "6 Clicks" Mobility   Outcome Measure  Help needed turning from your back to your side while in a flat bed without using bedrails?: Total Help needed moving from lying on your back to sitting on the side of a flat bed without using bedrails?: Total Help needed moving to and from a bed to a chair (including a wheelchair)?: Total Help needed standing up from a chair using your arms (e.g., wheelchair or bedside chair)?: Total Help needed to walk in hospital room?: Total Help needed climbing 3-5 steps with a railing? : Total 6 Click Score: 6    End of Session Equipment Utilized During Treatment: Gait belt Activity Tolerance: Patient limited by pain Patient left: with chair alarm set;in bed;with bed alarm set;with call bell/phone within reach   PT Visit Diagnosis: Muscle weakness (generalized) (M62.81);Difficulty in walking, not  elsewhere classified (R26.2) Pain - Right/Left: Left Pain - part of body: Knee     Time: 1314-3888 PT Time Calculation (min) (ACUTE ONLY): 24 min  Charges:  $Therapeutic Exercise: 8-22 mins $Therapeutic Activity: 8-22 mins                    Chesley Noon, PTA 07/24/20, 10:40 AM

## 2020-07-25 LAB — GLUCOSE, CAPILLARY
Glucose-Capillary: 202 mg/dL — ABNORMAL HIGH (ref 70–99)
Glucose-Capillary: 194 mg/dL — ABNORMAL HIGH (ref 70–99)
Glucose-Capillary: 339 mg/dL — ABNORMAL HIGH (ref 70–99)
Glucose-Capillary: 225 mg/dL — ABNORMAL HIGH (ref 70–99)

## 2020-07-25 MED ORDER — PREGABALIN 75 MG PO CAPS: 75.0000 mg | ORAL_CAPSULE | Freq: Two times a day (BID) | ORAL | 0 refills | Status: DC

## 2020-07-25 MED ORDER — OXYCODONE HCL 5 MG PO TABS
5.0000 mg | ORAL_TABLET | ORAL | 0 refills | Status: DC | PRN
Start: 2020-07-25 — End: 2021-01-03

## 2020-07-25 MED ORDER — ONDANSETRON HCL 4 MG PO TABS: 4.0000 mg | ORAL_TABLET | Freq: Four times a day (QID) | ORAL | 0 refills | Status: DC | PRN

## 2020-07-25 MED ORDER — TRAMADOL HCL 50 MG PO TABS: 50.0000 mg | ORAL_TABLET | Freq: Four times a day (QID) | ORAL | 0 refills | Status: DC | PRN

## 2020-07-25 MED ORDER — ENOXAPARIN SODIUM 40 MG/0.4ML ~~LOC~~ SOLN: 40.0000 mg | SUBCUTANEOUS | 0 refills | Status: DC

## 2020-07-25 NOTE — Plan of Care (Signed)
  Problem: Education: Goal: Knowledge of General Education information will improve Description: Including pain rating scale, medication(s)/side effects and non-pharmacologic comfort measures Outcome: Progressing   Problem: Health Behavior/Discharge Planning: Goal: Ability to manage health-related needs will improve Outcome: Progressing   Problem: Clinical Measurements: Goal: Ability to maintain clinical measurements within normal limits will improve Outcome: Progressing Goal: Will remain free from infection Outcome: Progressing Goal: Diagnostic test results will improve Outcome: Progressing Goal: Respiratory complications will improve Outcome: Progressing Goal: Cardiovascular complication will be avoided Outcome: Progressing   Problem: Activity: Goal: Risk for activity intolerance will decrease Outcome: Progressing   Problem: Nutrition: Goal: Adequate nutrition will be maintained Outcome: Progressing   Problem: Elimination: Goal: Will not experience complications related to bowel motility Outcome: Progressing  laxa Problem: Safety: Goal: Ability to remain free from injury will improve Outcome: Progressing   Problem: Pain Managment: Goal: General experience of comfort will improve Outcome: Progressing   Problem: Skin Integrity: Goal: Risk for impaired skin integrity will decrease Outcome: Progressing   Problem: Education: Goal: Knowledge of the prescribed therapeutic regimen will improve Outcome: Progressing Goal: Individualized Educational Video(s) Outcome: Progressing  tive given  Goal: Will not experience complications related to urinary retention Outcome: Progressing

## 2020-07-25 NOTE — Progress Notes (Signed)
Physical Therapy Treatment Patient Details Name: Tammy Hatfield MRN: [id number] DOB: 14-Mar-1946 Today's Date: 07/25/2020    History of Present Illness admitted for acute hospitalization s/p L TKR (07/21/20), WBAT.    PT Comments    Feeling better.  Poor sleep.  Participated in exercises as described below.  She is able to get to EOB with mod a x 1.  Time to gain balance.  She stated she needs to try to use commode.  Stood and transferred with mod a x 1 with poor safety and sits quickly on commode.  After voiding, no BM. +2 called for safety to assist with transfer back to bed.  Slow cautious steps with poor posture and heavy cues.  Max a x 2 to return to supine. Pt elects to stay on left side upon return to bed.  Educated on need to remain supine for LLE position but she stated he needed to stay side lying for her back.    SNF remains appropriate for discharge given performance and tolerance today.   Follow Up Recommendations  SNF     Equipment Recommendations  Rolling walker with 5" wheels    Recommendations for Other Services       Precautions / Restrictions Precautions Precautions: Fall Restrictions Weight Bearing Restrictions: Yes LLE Weight Bearing: Weight bearing as tolerated    Mobility  Bed Mobility Overal bed mobility: Needs Assistance Bed Mobility: Supine to Sit;Sit to Supine     Supine to sit: Mod assist Sit to supine: Max assist;+2 for physical assistance   General bed mobility comments: assist for all aspects of mobility  Transfers Overall transfer level: Needs assistance Equipment used: Rolling walker (2 wheeled) Transfers: Sit to/from Stand Sit to Stand: Min assist;Mod assist;+2 physical assistance            Ambulation/Gait Ambulation/Gait assistance: Min assist;+2 physical assistance Gait Distance (Feet): 3 Feet Assistive device: Rolling walker (2 wheeled) Gait Pattern/deviations: Step-to pattern;Trunk flexed;Decreased step length -  right;Decreased step length - left;Decreased stance time - left Gait velocity: decreased   General Gait Details: hesitant steps to/from commode with poor posture and cues to correct   Stairs             Wheelchair Mobility    Modified Rankin (Stroke Patients Only)       Balance Overall balance assessment: Needs assistance Sitting-balance support: Feet supported Sitting balance-Leahy Scale: Fair     Standing balance support: Bilateral upper extremity supported Standing balance-Leahy Scale: Poor Standing balance comment: heavy reliance on walker due to pain                            Cognition Arousal/Alertness: Awake/alert Behavior During Therapy: WFL for tasks assessed/performed Overall Cognitive Status: Within Functional Limits for tasks assessed                                        Exercises Total Joint Exercises Goniometric ROM: 0-75 - limited by pain Other Exercises Other Exercises: Seated LE therex, 1x12, act assist ROM: ankle pumps, LAQs, knee flexion    General Comments        Pertinent Vitals/Pain Pain Assessment: Faces Faces Pain Scale: Hurts whole lot Pain Location: L knee Pain Descriptors / Indicators: Aching;Sore;Guarding Pain Intervention(s): Limited activity within patient's tolerance;Monitored during session;Patient requesting pain meds-RN notified;Ice applied    Home Living  Prior Function            PT Goals (current goals can now be found in the care plan section) Progress towards PT goals: Progressing toward goals    Frequency    BID      PT Plan      Co-evaluation              AM-PAC PT "6 Clicks" Mobility   Outcome Measure  Help needed turning from your back to your side while in a flat bed without using bedrails?: Total Help needed moving from lying on your back to sitting on the side of a flat bed without using bedrails?: A Lot Help needed moving to  and from a bed to a chair (including a wheelchair)?: A Lot Help needed standing up from a chair using your arms (e.g., wheelchair or bedside chair)?: A Lot Help needed to walk in hospital room?: Total Help needed climbing 3-5 steps with a railing? : Total 6 Click Score: 9    End of Session Equipment Utilized During Treatment: Gait belt Activity Tolerance: Patient limited by pain Patient left: with chair alarm set;in bed;with bed alarm set;with call bell/phone within reach   PT Visit Diagnosis: Muscle weakness (generalized) (M62.81);Difficulty in walking, not elsewhere classified (R26.2) Pain - Right/Left: Left Pain - part of body: Knee     Time: 7517-0017 PT Time Calculation (min) (ACUTE ONLY): 27 min  Charges:  $Therapeutic Exercise: 8-22 mins $Therapeutic Activity: 8-22 mins                    Chesley Noon, PTA 07/25/20, 10:34 AM

## 2020-07-25 NOTE — Progress Notes (Signed)
Physical Therapy Treatment Patient Details Name: Tammy Hatfield MRN: [id number] DOB: 08-13-1946 Today's Date: 07/25/2020    History of Present Illness admitted for acute hospitalization s/p L TKR (07/21/20), WBAT.    PT Comments    .Participated in exercises as described below.  To EOB with improved mobility this pm  Min a for LE management and rail and generally more comfortable.  She is able to remains sitting x 15 minutes this pm with continued seated tx.  She stands x 1 with emphasis on weight shifting left/right and posture.  She is able to remain standing x 2 minutes before asking to return to sitting.  Pain remains primary barrier for progression of mobility.  She asks for and is given a bedpan after session.     Follow Up Recommendations  SNF     Equipment Recommendations  Rolling walker with 5" wheels    Recommendations for Other Services       Precautions / Restrictions Precautions Precautions: Fall Restrictions Weight Bearing Restrictions: Yes LLE Weight Bearing: Weight bearing as tolerated    Mobility  Bed Mobility Overal bed mobility: Needs Assistance Bed Mobility: Supine to Sit;Sit to Supine     Supine to sit: Min assist;Mod assist Sit to supine: Mod assist;Max assist   General bed mobility comments: improved supine to sit this session.  Transfers Overall transfer level: Needs assistance Equipment used: Rolling walker (2 wheeled) Transfers: Sit to/from Stand Sit to Stand: Mod assist         General transfer comment: stood EOB with emphasis on posture and weight shifting. unable to progress gait  Ambulation/Gait                 Stairs             Wheelchair Mobility    Modified Rankin (Stroke Patients Only)       Balance Overall balance assessment: Needs assistance Sitting-balance support: Feet supported Sitting balance-Leahy Scale: Fair Sitting balance - Comments: guarded balance due to pain   Standing balance  support: Bilateral upper extremity supported Standing balance-Leahy Scale: Poor Standing balance comment: heavy reliance on walker due to pain                            Cognition Arousal/Alertness: Awake/alert Behavior During Therapy: WFL for tasks assessed/performed Overall Cognitive Status: Within Functional Limits for tasks assessed                                        Exercises Other Exercises Other Exercises: Seated LE therex, 1x12, act assist ROM: ankle pumps, LAQs, knee flexion  supine heel slides, ab/add, SLR, SAQ quad sets x 10    General Comments        Pertinent Vitals/Pain Pain Assessment: Faces Faces Pain Scale: Hurts even more Pain Location: L knee Pain Descriptors / Indicators: Aching;Sore;Guarding Pain Intervention(s): Limited activity within patient's tolerance;Monitored during session;Repositioned    Home Living                      Prior Function            PT Goals (current goals can now be found in the care plan section) Progress towards PT goals: Progressing toward goals    Frequency    BID      PT Plan Current plan  remains appropriate    Co-evaluation              AM-PAC PT "6 Clicks" Mobility   Outcome Measure  Help needed turning from your back to your side while in a flat bed without using bedrails?: A Lot Help needed moving from lying on your back to sitting on the side of a flat bed without using bedrails?: A Lot Help needed moving to and from a bed to a chair (including a wheelchair)?: A Lot Help needed standing up from a chair using your arms (e.g., wheelchair or bedside chair)?: A Lot Help needed to walk in hospital room?: Total Help needed climbing 3-5 steps with a railing? : Total 6 Click Score: 10    End of Session Equipment Utilized During Treatment: Gait belt Activity Tolerance: Patient limited by pain Patient left: in bed;with call bell/phone within reach;with bed alarm  set Nurse Communication: Mobility status;Other (comment) Pain - Right/Left: Left Pain - part of body: Knee     Time: 1747-1595 PT Time Calculation (min) (ACUTE ONLY): 34 min  Charges:  $Therapeutic Exercise: 8-22 mins $Therapeutic Activity: 8-22 mins                    Chesley Noon, PTA 07/25/20, 2:59 PM

## 2020-07-25 NOTE — Progress Notes (Signed)
  Subjective: 4 Days Post-Op Procedure(s) (LRB): TOTAL KNEE ARTHROPLASTY (Left) Patient reports pain as improved, was not able to do much with PT over the weekend due to issues with pain medication. Patient feels more responsive this AM. Plan is to go Rehab after hospital stay. Negative for chest pain and shortness of breath Fever: no Gastrointestinal: negative for nausea and vomiting.  Patient has not had a bowel movement.  Objective: Vital signs in last 24 hours: Temp:  [97.9 F (36.6 C)-99.6 F (37.6 C)] 98.2 F (36.8 C) (11/15 0451) Pulse Rate:  [85-96] 86 (11/15 0451) Resp:  [16-20] 18 (11/15 0004) BP: (127-160)/(49-62) 154/54 (11/15 0451) SpO2:  [96 %-97 %] 97 % (11/15 0451)  Intake/Output from previous day:  Intake/Output Summary (Last 24 hours) at 07/25/2020 0743 Last data filed at 07/25/2020 0500 Gross per 24 hour  Intake 360 ml  Output 800 ml  Net -440 ml    Intake/Output this shift: No intake/output data recorded.  Labs: Recent Labs    07/23/20 0547  HGB 11.0*   Recent Labs    07/23/20 0547  WBC 9.2  RBC 3.68*  HCT 32.8*  PLT 159   Recent Labs    07/23/20 0547 07/24/20 0430  NA 131* 126*  K 4.0 4.1  CL 94* 96*  CO2 29 23  BUN 11 48*  CREATININE 0.74 1.00  GLUCOSE 187* 116*  CALCIUM 8.5* 7.8*   No results for input(s): LABPT, INR in the last 72 hours.   EXAM General - Patient is Alert, Appropriate and Oriented Extremity - Neurovascular intact Sensation intact distally Dorsiflexion/Plantar flexion intact Compartment soft Dressing/Incision -minimal dried sanguinous drainage noted over honeycomb Motor Function - intact, moving foot and toes well on exam.  Cardiovascular- Regular rate and rhythm, no murmurs/rubs/gallops Respiratory- Lungs clear to auscultation bilaterally Gastrointestinal- soft, nontender and active bowel sounds  Assessment/Plan: 4 Days Post-Op Procedure(s) (LRB): TOTAL KNEE ARTHROPLASTY (Left) Active Problems:    Status post total knee replacement using cement, left  Estimated body mass index is 36.05 kg/m as calculated from the following:   Height as of this encounter: 5\' 4"  (1.626 m).   Weight as of this encounter: 95.3 kg. Advance diet Up with therapy Discharge to SNF pending insurance approval and bed placement.  TED hose placed on operative leg.  Patient has not had a BM yet, move on to FLEET enema today. Hopeful discharge to SNF today pending insurance auth and bed placement. Continue with therapy today, monitor amount of pain medicine received.   DVT Prophylaxis - Lovenox, Ted hose and SCDs Weight-Bearing as tolerated to left leg  J. Cameron Proud, PA-C [facility] 07/25/2020, 7:43 AM

## 2020-07-25 NOTE — TOC Progression Note (Signed)
Transition of Care (TOC) - Progression Note  ° ° °Patient Details  °Name: Tammy Hatfield °MRN: [id number] °Date of Birth: 12/05/1945 ° °Transition of Care (TOC) CM/SW Contact  ° D , RN °Phone Number: °07/25/2020, 11:10 AM ° °Clinical Narrative:   RNCM met with patient at bedside, patient resting quietly but easily arousable. Discussed with patient that her bed offers at this time are [facility] and [facility]. Patient called son in room and he was on speaker. Discussed recommendations and provided them with Medicare ratings for facilities. After discussion patient chooses [facility]. RNCM reached out to Tammy with [facility] and she will start insurance authorization.  ° ° ° °Expected Discharge Plan: Skilled Nursing Facility °Barriers to Discharge: No Barriers Identified ° °Expected Discharge Plan and Services °Expected Discharge Plan: Skilled Nursing Facility °  °  °Post Acute Care Choice: Nursing Home °Living arrangements for the past 2 months: Apartment °                °  °  °  °  °  °  °  °  °  °  ° ° °Social Determinants of Health (SDOH) Interventions °  ° °Readmission Risk Interventions °No flowsheet data found. ° °

## 2020-07-25 NOTE — Progress Notes (Signed)
Assumed care for patient from [place], [place]

## 2020-07-25 NOTE — Care Management Important Message (Signed)
Important Message  Patient Details  Name: Tammy Hatfield MRN: [id number] Date of Birth: 1945/12/24   Medicare Important Message Given:  Yes     Juliann Pulse A Madi Bonfiglio 07/25/2020, 12:13 PM

## 2020-07-26 LAB — SARS CORONAVIRUS 2 BY RT PCR (HOSPITAL ORDER, PERFORMED IN ~~LOC~~ HOSPITAL LAB): SARS Coronavirus 2: NEGATIVE

## 2020-07-26 LAB — GLUCOSE, CAPILLARY
Glucose-Capillary: 175 mg/dL — ABNORMAL HIGH (ref 70–99)
Glucose-Capillary: 232 mg/dL — ABNORMAL HIGH (ref 70–99)

## 2020-07-26 MED ORDER — INSULIN ASPART 100 UNIT/ML ~~LOC~~ SOLN: 3.0000 [IU] | Freq: Three times a day (TID) | SUBCUTANEOUS | Status: DC

## 2020-07-26 MED ORDER — INSULIN ASPART 100 UNIT/ML ~~LOC~~ SOLN: 3.0000 [IU] | Freq: Three times a day (TID) | SUBCUTANEOUS | 11 refills | Status: DC

## 2020-07-26 NOTE — Progress Notes (Signed)
Subjective: 5 Days Post-Op Procedure(s) (LRB): TOTAL KNEE ARTHROPLASTY (Left) Patient reports pain as mild this morning. Patient has not had a BM yet. Plan is to go Rehab after hospital stay. Plan is for discharge to [facility]. Negative for chest pain and shortness of breath Fever: no Gastrointestinal: negative for nausea and vomiting.    Objective: Vital signs in last 24 hours: Temp:  [97.8 F (36.6 C)-98.6 F (37 C)] 97.8 F (36.6 C) (11/16 1155) Pulse Rate:  [80-90] 85 (11/16 1155) Resp:  [16-18] 16 (11/16 1155) BP: (149-156)/(45-65) 154/62 (11/16 1155) SpO2:  [95 %-100 %] 96 % (11/16 1155)  Intake/Output from previous day:  Intake/Output Summary (Last 24 hours) at 07/26/2020 1340 Last data filed at 07/26/2020 0609 Gross per 24 hour  Intake --  Output 800 ml  Net -800 ml    Intake/Output this shift: No intake/output data recorded.  Labs: No results for input(s): HGB in the last 72 hours. No results for input(s): WBC, RBC, HCT, PLT in the last 72 hours. Recent Labs    07/24/20 0430  NA 126*  K 4.1  CL 96*  CO2 23  BUN 48*  CREATININE 1.00  GLUCOSE 116*  CALCIUM 7.8*   No results for input(s): LABPT, INR in the last 72 hours.  EXAM General - Patient is Alert, Appropriate and Oriented Extremity - Neurovascular intact Sensation intact distally Dorsiflexion/Plantar flexion intact Compartment soft Dressing/Incision -minimal dried sanguinous drainage noted over honeycomb Motor Function - intact, moving foot and toes well on exam.  Cardiovascular- Regular rate and rhythm, no murmurs/rubs/gallops Respiratory- Lungs clear to auscultation bilaterally Gastrointestinal- soft, nontender and active bowel sounds  Assessment/Plan: 5 Days Post-Op Procedure(s) (LRB): TOTAL KNEE ARTHROPLASTY (Left) Active Problems:   Status post total knee replacement using cement, left  Estimated body mass index is 36.05 kg/m as calculated from the following:   Height as of  this encounter: 5\' 4"  (1.626 m).   Weight as of this encounter: 95.3 kg. Advance diet Up with therapy Discharge to SNF today pending a bowel movement.  Patient has not had a BM yet, move on to FLEET enema today. Continue with therapy today, monitor amount of pain medicine received.   DVT Prophylaxis - Lovenox, Ted hose and SCDs Weight-Bearing as tolerated to left leg  J. Cameron Proud, PA-C [facility] 07/26/2020, 1:40 PM

## 2020-07-26 NOTE — Progress Notes (Signed)
Physical Therapy Treatment Patient Details Name: NANNETTE ZILL MRN: [id number] DOB: 11/19/1945 Today's Date: 07/26/2020    History of Present Illness admitted for acute hospitalization s/p L TKR (07/21/20), WBAT.    PT Comments    Patient alert, agreeable to PT, reported 3-4/10 at rest, but reported it spikes with mobility. Session limited due to pain and pt somewhat self limiting because of this. Several supine exercises performed in supine, AAROM for heel slides and SLR. Supine to sit with minA, and sit <> stand from EOB and from [facility] with minA RW and extended time. Pt with poor weight shift to LLE during transitional movements, very limited by pain. Pt voided on BSC and then transferred to recliner, repositioned for comfort. Pt did complain of L heel pain, RN notified; unable to visualize due to ted hose. The patient would benefit from further skilled PT intervention to progress towards goals as able. Recommendation remains appropriate.         Follow Up Recommendations  SNF     Equipment Recommendations  Rolling walker with 5" wheels    Recommendations for Other Services       Precautions / Restrictions Precautions Precautions: Fall;Knee Precaution Booklet Issued: No Restrictions Weight Bearing Restrictions: Yes LLE Weight Bearing: Weight bearing as tolerated    Mobility  Bed Mobility Overal bed mobility: Needs Assistance Bed Mobility: Supine to Sit     Supine to sit: Min assist     General bed mobility comments: LLE assist  Transfers Overall transfer level: Needs assistance Equipment used: Rolling walker (2 wheeled) Transfers: Sit to/from [facility] Sit to Stand: Min assist Stand pivot transfers: Min assist       General transfer comment: minA from EOB and from [facility], heavy reliance on UE support  Ambulation/Gait                 Stairs             Wheelchair Mobility    Modified Rankin (Stroke Patients Only)        Balance Overall balance assessment: Needs assistance Sitting-balance support: Feet supported Sitting balance-Leahy Scale: Fair Sitting balance - Comments: guarded balance due to pain   Standing balance support: Bilateral upper extremity supported Standing balance-Leahy Scale: Poor Standing balance comment: heavy reliance on walker due to pain                            Cognition Arousal/Alertness: Awake/alert Behavior During Therapy: WFL for tasks assessed/performed Overall Cognitive Status: Within Functional Limits for tasks assessed                                        Exercises Total Joint Exercises Ankle Circles/Pumps: AROM;Both;10 reps;Strengthening Quad Sets: AROM;Strengthening;Left;10 reps Heel Slides: AAROM;Left;10 reps Hip ABduction/ADduction: AROM;Strengthening;Left;10 reps Straight Leg Raises: AAROM;Strengthening;Left;10 reps    General Comments        Pertinent Vitals/Pain Pain Assessment: Faces Faces Pain Scale: Hurts even more Pain Location: L knee Pain Descriptors / Indicators: Aching;Sore;Guarding;Grimacing;Moaning Pain Intervention(s): Limited activity within patient's tolerance;Monitored during session;Repositioned;Ice applied;Premedicated before session    Home Living                      Prior Function            PT Goals (current goals can now be found  in the care plan section) Progress towards PT goals: Progressing toward goals    Frequency    BID      PT Plan Current plan remains appropriate    Co-evaluation              AM-PAC PT "6 Clicks" Mobility   Outcome Measure  Help needed turning from your back to your side while in a flat bed without using bedrails?: A Lot Help needed moving from lying on your back to sitting on the side of a flat bed without using bedrails?: A Lot Help needed moving to and from a bed to a chair (including a wheelchair)?: A Lot Help needed standing up from a  chair using your arms (e.g., wheelchair or bedside chair)?: A Lot Help needed to walk in hospital room?: A Lot Help needed climbing 3-5 steps with a railing? : Total 6 Click Score: 11    End of Session Equipment Utilized During Treatment: Gait belt Activity Tolerance: Patient limited by pain Patient left: in chair;with chair alarm set;with call bell/phone within reach Nurse Communication: Mobility status PT Visit Diagnosis: Muscle weakness (generalized) (M62.81);Difficulty in walking, not elsewhere classified (R26.2);Other abnormalities of gait and mobility (R26.89) Pain - Right/Left: Left Pain - part of body: Knee     Time: 1916-6060 PT Time Calculation (min) (ACUTE ONLY): 45 min  Charges:  $Therapeutic Exercise: 38-52 mins                     Lieutenant Diego PT, DPT 1:02 PM,07/26/20

## 2020-07-26 NOTE — Plan of Care (Signed)
Problem: Education: Goal: Knowledge of General Education information will improve Description: Including pain rating scale, medication(s)/side effects and non-pharmacologic comfort measures 07/26/2020 1651 by Trula Slade, RN Outcome: Adequate for Discharge 07/26/2020 0957 by Trula Slade, RN Outcome: Progressing 07/26/2020 0956 by Trula Slade, RN Outcome: Progressing   Problem: Health Behavior/Discharge Planning: Goal: Ability to manage health-related needs will improve 07/26/2020 1651 by Trula Slade, RN Outcome: Adequate for Discharge 07/26/2020 0957 by Trula Slade, RN Outcome: Progressing 07/26/2020 0956 by Trula Slade, RN Outcome: Progressing   Problem: Clinical Measurements: Goal: Ability to maintain clinical measurements within normal limits will improve 07/26/2020 1651 by Trula Slade, RN Outcome: Adequate for Discharge 07/26/2020 0957 by Trula Slade, RN Outcome: Progressing 07/26/2020 0956 by Trula Slade, RN Outcome: Progressing Goal: Will remain free from infection 07/26/2020 1651 by Trula Slade, RN Outcome: Adequate for Discharge 07/26/2020 0957 by Trula Slade, RN Outcome: Progressing 07/26/2020 0956 by Trula Slade, RN Outcome: Progressing Goal: Diagnostic test results will improve 07/26/2020 1651 by Trula Slade, RN Outcome: Adequate for Discharge 07/26/2020 0957 by Trula Slade, RN Outcome: Progressing 07/26/2020 0956 by Trula Slade, RN Outcome: Progressing Goal: Respiratory complications will improve 07/26/2020 1651 by Trula Slade, RN Outcome: Adequate for Discharge 07/26/2020 0957 by Trula Slade, RN Outcome: Progressing 07/26/2020 0956 by Trula Slade, RN Outcome: Progressing Goal: Cardiovascular complication will be avoided 07/26/2020 1651 by Trula Slade, RN Outcome: Adequate for Discharge 07/26/2020 0957 by Trula Slade, RN Outcome:  Progressing 07/26/2020 0956 by Trula Slade, RN Outcome: Progressing   Problem: Activity: Goal: Risk for activity intolerance will decrease 07/26/2020 1651 by Trula Slade, RN Outcome: Adequate for Discharge 07/26/2020 0957 by Trula Slade, RN Outcome: Progressing 07/26/2020 0956 by Trula Slade, RN Outcome: Progressing   Problem: Nutrition: Goal: Adequate nutrition will be maintained 07/26/2020 1651 by Trula Slade, RN Outcome: Adequate for Discharge 07/26/2020 0957 by Trula Slade, RN Outcome: Progressing 07/26/2020 0956 by Trula Slade, RN Outcome: Progressing   Problem: Coping: Goal: Level of anxiety will decrease 07/26/2020 1651 by Trula Slade, RN Outcome: Adequate for Discharge 07/26/2020 0957 by Trula Slade, RN Outcome: Progressing 07/26/2020 0956 by Trula Slade, RN Outcome: Progressing   Problem: Elimination: Goal: Will not experience complications related to bowel motility 07/26/2020 1651 by Trula Slade, RN Outcome: Adequate for Discharge 07/26/2020 0957 by Trula Slade, RN Outcome: Progressing 07/26/2020 0956 by Trula Slade, RN Outcome: Progressing Goal: Will not experience complications related to urinary retention 07/26/2020 1651 by Trula Slade, RN Outcome: Adequate for Discharge 07/26/2020 0957 by Trula Slade, RN Outcome: Progressing 07/26/2020 0956 by Trula Slade, RN Outcome: Progressing   Problem: Pain Managment: Goal: General experience of comfort will improve 07/26/2020 1651 by Trula Slade, RN Outcome: Adequate for Discharge 07/26/2020 0957 by Trula Slade, RN Outcome: Progressing 07/26/2020 0956 by Trula Slade, RN Outcome: Progressing   Problem: Safety: Goal: Ability to remain free from injury will improve 07/26/2020 1651 by Trula Slade, RN Outcome: Adequate for Discharge 07/26/2020 0957 by Trula Slade, RN Outcome:  Progressing 07/26/2020 0956 by Trula Slade, RN Outcome: Progressing   Problem: Skin Integrity: Goal: Risk for impaired skin integrity will decrease 07/26/2020 1651 by Trula Slade, RN Outcome: Adequate for Discharge 07/26/2020 0957 by Trula Slade, RN Outcome: Progressing 07/26/2020 0956 by Trula Slade, RN  Outcome: Progressing   Problem: Education: Goal: Knowledge of the prescribed therapeutic regimen will improve 07/26/2020 1651 by Trula Slade, RN Outcome: Adequate for Discharge 07/26/2020 0957 by Trula Slade, RN Outcome: Progressing 07/26/2020 0956 by Trula Slade, RN Outcome: Progressing Goal: Individualized Educational Video(s) 07/26/2020 1651 by Trula Slade, RN Outcome: Adequate for Discharge 07/26/2020 0957 by Trula Slade, RN Outcome: Progressing 07/26/2020 0956 by Trula Slade, RN Outcome: Progressing

## 2020-07-26 NOTE — Progress Notes (Signed)
Inpatient Diabetes Program Recommendations  AACE/[name]: New Consensus Statement on Inpatient Glycemic Control (2015)  Target Ranges:  Prepandial:   less than 140 mg/dL      Peak postprandial:   less than 180 mg/dL (1-2 hours)      Critically ill patients:  140 - 180 mg/dL   Lab Results  Component Value Date   GLUCAP 175 (H) 07/26/2020   HGBA1C 6.9 (H) 07/21/2020    Review of Glycemic Control Results for Tammy, SCHERMERHORN A "[name]" (MRN [id number]) as of 07/26/2020 10:42  Ref. Range 07/25/2020 08:12 07/25/2020 12:12 07/25/2020 16:27 07/25/2020 21:20 07/26/2020 07:52  Glucose-Capillary Latest Ref Range: 70 - 99 mg/dL 202 (H) 194 (H) 339 (H) 225 (H) 175 (H)   Diabetes history: DM 2 Outpatient Diabetes medications:  Tresiba 66 units q HS Current orders for Inpatient glycemic control:  Lantus 66 units q HS Novolog resistant (0-20 units) tid with meals and HS  Inpatient Diabetes Program Recommendations:     Please add Novolog meal coverage 3 units tid with meals (hold if patient eats less than 50% or NPO).   Thanks,  Adah Perl, RN, BC-ADM Inpatient Diabetes Coordinator Pager [phone] (8a-5p)

## 2020-07-26 NOTE — Progress Notes (Addendum)
Physical Therapy Treatment Patient Details Name: Tammy Hatfield MRN: [id number] DOB: Aug 28, 1946 Today's Date: 07/26/2020    History of Present Illness admitted for acute hospitalization s/p L TKR (07/21/20), WBAT.    PT Comments    Pt was long sitting in bed upon arriving. She agrees to PT session. Was able to exit L side of bed, stand and ambulate 15 ft with RW. No LOB but is limited by need for BM. Does have successful BM. RN aware. She was able to perform AROM knee flex to 74 degrees but AAROM knee flex to 84 degrees. Overall pt is progressing well towards goals. Do recommend SNF at [place] to address deficits and improve safety with all ADLs.   Follow Up Recommendations  SNF     Equipment Recommendations  Rolling walker with 5" wheels    Recommendations for Other Services       Precautions / Restrictions Precautions Precautions: Fall;Knee Precaution Booklet Issued: Yes (comment) Restrictions Weight Bearing Restrictions: Yes LLE Weight Bearing: Weight bearing as tolerated    Mobility  Bed Mobility Overal bed mobility: Needs Assistance Bed Mobility: Supine to Sit     Supine to sit: Min assist Sit to supine: Max assist   General bed mobility comments: LLE assist  Transfers Overall transfer level: Needs assistance Equipment used: Rolling walker (2 wheeled) Transfers: Sit to/from Stand Sit to Stand: Min assist;Min guard Stand pivot transfers: Min assist       General transfer comment: Pt was able to STS from EOB 3 x during session  Ambulation/Gait Ambulation/Gait assistance: Min guard Gait Distance (Feet): 15 Feet Assistive device: Rolling walker (2 wheeled) Gait Pattern/deviations: Step-to pattern;Trunk flexed Gait velocity: decreased   General Gait Details: pt was able to ambulate 15 ft without LOB. does have antalgic step to pattern. limited distance due to pt needing to have BM.       Balance Overall balance assessment: Needs  assistance Sitting-balance support: Feet supported Sitting balance-Leahy Scale: Good Sitting balance - Comments: no LOB oin sitting   Standing balance support: Bilateral upper extremity supported Standing balance-Leahy Scale: Good Standing balance comment: heavy reliance on walker due to pain       Cognition Arousal/Alertness: Awake/alert Behavior During Therapy: WFL for tasks assessed/performed Overall Cognitive Status: Within Functional Limits for tasks assessed      General Comments: Pt is A and O x 4      Exercises Total Joint Exercises Ankle Circles/Pumps: AROM;Both;10 reps;Strengthening Quad Sets: AROM;Strengthening;Left;10 reps Heel Slides: AAROM;Left;10 reps Hip ABduction/ADduction: AROM;Strengthening;Left;10 reps Straight Leg Raises: AAROM;Strengthening;Left;10 reps Knee Flexion: AAROM;Left;Seated Goniometric ROM: 84 degrees flexion        Pertinent Vitals/Pain Pain Assessment: 0-10 Pain Score: 4  Faces Pain Scale: Hurts little more Pain Location: L knee Pain Descriptors / Indicators: Aching;Sore;Guarding;Grimacing;Moaning Pain Intervention(s): Limited activity within patient's tolerance;Monitored during session;Premedicated before session           PT Goals (current goals can now be found in the care plan section) Acute Rehab PT Goals Patient Stated Goal: go to rehab than home Progress towards PT goals: Progressing toward goals    Frequency    BID      PT Plan Current plan remains appropriate       AM-PAC PT "6 Clicks" Mobility   Outcome Measure  Help needed turning from your back to your side while in a flat bed without using bedrails?: A Lot Help needed moving from lying on your back to sitting on the side of a  flat bed without using bedrails?: A Lot Help needed moving to and from a bed to a chair (including a wheelchair)?: A Lot Help needed standing up from a chair using your arms (e.g., wheelchair or bedside chair)?: A Lot Help needed to  walk in hospital room?: A Lot Help needed climbing 3-5 steps with a railing? : Total 6 Click Score: 11    End of Session Equipment Utilized During Treatment: Oxygen Activity Tolerance: Patient tolerated treatment well Patient left: in bed;with call bell/phone within reach;with bed alarm set;with nursing/sitter in room Nurse Communication: Mobility status PT Visit Diagnosis: Muscle weakness (generalized) (M62.81);Difficulty in walking, not elsewhere classified (R26.2);Other abnormalities of gait and mobility (R26.89) Pain - Right/Left: Left Pain - part of body: Knee     Time: 1315-1350 PT Time Calculation (min) (ACUTE ONLY): 35 min  Charges:  $Gait Training: 8-22 mins $Therapeutic Exercise: 8-22 mins                     Julaine Fusi PTA 07/26/20, 4:29 PM

## 2020-07-26 NOTE — Plan of Care (Signed)
  Problem: Education: Goal: Knowledge of General Education information will improve Description: Including pain rating scale, medication(s)/side effects and non-pharmacologic comfort measures Outcome: Progressing   Problem: Health Behavior/Discharge Planning: Goal: Ability to manage health-related needs will improve Outcome: Progressing   Problem: Clinical Measurements: Goal: Ability to maintain clinical measurements within normal limits will improve Outcome: Progressing Goal: Will remain free from infection Outcome: Progressing Goal: Diagnostic test results will improve Outcome: Progressing Goal: Respiratory complications will improve Outcome: Progressing Goal: Cardiovascular complication will be avoided Outcome: Progressing   Problem: Activity: Goal: Risk for activity intolerance will decrease Outcome: Progressing   Problem: Coping: Goal: Level of anxiety will decrease 07/26/2020 0957 by Trula Slade, RN Outcome: Progressing 07/26/2020 0956 by Trula Slade, RN Outcome: Progressing   Problem: Elimination: Goal: Will not experience complications related to bowel motility 07/26/2020 0957 by Trula Slade, RN Outcome: Progressing 07/26/2020 0956 by Trula Slade, RN Outcome: Progressing Goal: Will not experience complications related to urinary retention 07/26/2020 0957 by Trula Slade, RN Outcome: Progressing 07/26/2020 0956 by Trula Slade, RN Outcome: Progressing   Problem: Safety: Goal: Ability to remain free from injury will improve 07/26/2020 0957 by Trula Slade, RN Outcome: Progressing 07/26/2020 0956 by Trula Slade, RN Outcome: Progressing   Problem: Skin Integrity: Goal: Risk for impaired skin integrity will decrease 07/26/2020 0957 by Trula Slade, RN Outcome: Progressing 07/26/2020 0956 by Trula Slade, RN Outcome: Progressing   Problem: Activity: Goal: Ability to avoid complications of mobility  impairment will improve 07/26/2020 0957 by Trula Slade, RN Outcome: Progressing 07/26/2020 0956 by Trula Slade, RN Outcome: Progressing Goal: Range of joint motion will improve 07/26/2020 0957 by Trula Slade, RN Outcome: Progressing 07/26/2020 0956 by Trula Slade, RN Outcome: Progressing   Problem: Education: Goal: Knowledge of the prescribed therapeutic regimen will improve 07/26/2020 0957 by Trula Slade, RN Outcome: Progressing 07/26/2020 0956 by Trula Slade, RN Outcome: Progressing Goal: Individualized Educational Video(s) 07/26/2020 0957 by Trula Slade, RN Outcome: Progressing 07/26/2020 0956 by Trula Slade, RN Outcome: Progressing

## 2020-07-27 NOTE — Progress Notes (Signed)
Patient discharged on 11/16 by RN Gerald Stabs.

## 2020-08-08 ENCOUNTER — Other Ambulatory Visit: Payer: Medicare HMO

## 2020-08-19 ENCOUNTER — Other Ambulatory Visit: Payer: Self-pay

## 2020-08-19 ENCOUNTER — Encounter: Payer: Self-pay | Admitting: Emergency Medicine

## 2020-08-19 ENCOUNTER — Emergency Department
Admission: EM | Admit: 2020-08-19 | Discharge: 2020-08-19 | Disposition: A | Discharge status: Home / Self Care | Payer: Medicare HMO | Attending: Emergency Medicine | Admitting: Emergency Medicine

## 2020-08-19 ENCOUNTER — Emergency Department: Payer: Medicare HMO

## 2020-08-19 DIAGNOSIS — Z87891 Personal history of nicotine dependence: Secondary | ICD-10-CM | POA: Diagnosis not present

## 2020-08-19 DIAGNOSIS — Z96652 Presence of left artificial knee joint: Secondary | ICD-10-CM | POA: Diagnosis not present

## 2020-08-19 DIAGNOSIS — Z951 Presence of aortocoronary bypass graft: Secondary | ICD-10-CM | POA: Diagnosis not present

## 2020-08-19 DIAGNOSIS — Z7982 Long term (current) use of aspirin: Secondary | ICD-10-CM | POA: Diagnosis not present

## 2020-08-19 DIAGNOSIS — E119 Type 2 diabetes mellitus without complications: Secondary | ICD-10-CM | POA: Diagnosis not present

## 2020-08-19 DIAGNOSIS — E860 Dehydration: Secondary | ICD-10-CM | POA: Insufficient documentation

## 2020-08-19 DIAGNOSIS — I10 Essential (primary) hypertension: Secondary | ICD-10-CM | POA: Insufficient documentation

## 2020-08-19 DIAGNOSIS — Z853 Personal history of malignant neoplasm of breast: Secondary | ICD-10-CM | POA: Insufficient documentation

## 2020-08-19 DIAGNOSIS — Z20822 Contact with and (suspected) exposure to covid-19: Secondary | ICD-10-CM | POA: Diagnosis not present

## 2020-08-19 DIAGNOSIS — K5730 Diverticulosis of large intestine without perforation or abscess without bleeding: Secondary | ICD-10-CM | POA: Diagnosis not present

## 2020-08-19 DIAGNOSIS — R110 Nausea: Secondary | ICD-10-CM | POA: Diagnosis present

## 2020-08-19 DIAGNOSIS — I25118 Atherosclerotic heart disease of native coronary artery with other forms of angina pectoris: Secondary | ICD-10-CM | POA: Diagnosis not present

## 2020-08-19 DIAGNOSIS — I110 Hypertensive heart disease with heart failure: Secondary | ICD-10-CM | POA: Insufficient documentation

## 2020-08-19 DIAGNOSIS — R1032 Left lower quadrant pain: Secondary | ICD-10-CM | POA: Diagnosis not present

## 2020-08-19 DIAGNOSIS — J449 Chronic obstructive pulmonary disease, unspecified: Secondary | ICD-10-CM | POA: Insufficient documentation

## 2020-08-19 DIAGNOSIS — I509 Heart failure, unspecified: Secondary | ICD-10-CM | POA: Diagnosis not present

## 2020-08-19 DIAGNOSIS — I700 Atherosclerosis of aorta: Secondary | ICD-10-CM | POA: Diagnosis not present

## 2020-08-19 DIAGNOSIS — R10814 Left lower quadrant abdominal tenderness: Secondary | ICD-10-CM | POA: Diagnosis not present

## 2020-08-19 DIAGNOSIS — Z9049 Acquired absence of other specified parts of digestive tract: Secondary | ICD-10-CM | POA: Insufficient documentation

## 2020-08-19 DIAGNOSIS — Z79899 Other long term (current) drug therapy: Secondary | ICD-10-CM | POA: Diagnosis not present

## 2020-08-19 DIAGNOSIS — K449 Diaphragmatic hernia without obstruction or gangrene: Secondary | ICD-10-CM | POA: Diagnosis not present

## 2020-08-19 DIAGNOSIS — R197 Diarrhea, unspecified: Secondary | ICD-10-CM | POA: Diagnosis not present

## 2020-08-19 DIAGNOSIS — Z794 Long term (current) use of insulin: Secondary | ICD-10-CM | POA: Diagnosis not present

## 2020-08-19 LAB — URINALYSIS, COMPLETE (UACMP) WITH MICROSCOPIC
Bacteria, UA: NONE SEEN
Bilirubin Urine: NEGATIVE
Glucose, UA: NEGATIVE mg/dL
Hgb urine dipstick: NEGATIVE
Ketones, ur: NEGATIVE mg/dL
Nitrite: NEGATIVE
Protein, ur: NEGATIVE mg/dL
Specific Gravity, Urine: 1.039 — ABNORMAL HIGH (ref 1.005–1.030)
Squamous Epithelial / HPF: NONE SEEN (ref 0–5)
pH: 6 (ref 5.0–8.0)

## 2020-08-19 LAB — COMPREHENSIVE METABOLIC PANEL
ALT: 10 U/L (ref 0–44)
AST: 14 U/L — ABNORMAL LOW (ref 15–41)
Albumin: 3.8 g/dL (ref 3.5–5.0)
Alkaline Phosphatase: 113 U/L (ref 38–126)
Anion gap: 11 (ref 5–15)
BUN: 20 mg/dL (ref 8–23)
CO2: 27 mmol/L (ref 22–32)
Calcium: 9.7 mg/dL (ref 8.9–10.3)
Chloride: 96 mmol/L — ABNORMAL LOW (ref 98–111)
Creatinine, Ser: 1.05 mg/dL — ABNORMAL HIGH (ref 0.44–1.00)
GFR, Estimated: 56 mL/min — ABNORMAL LOW (ref >60)
Glucose, Bld: 210 mg/dL — ABNORMAL HIGH (ref 70–99)
Potassium: 5 mmol/L (ref 3.5–5.1)
Sodium: 134 mmol/L — ABNORMAL LOW (ref 135–145)
Total Bilirubin: 0.8 mg/dL (ref 0.3–1.2)
Total Protein: 7.3 g/dL (ref 6.5–8.1)

## 2020-08-19 LAB — CBC
HCT: 40.5 % (ref 36.0–46.0)
Hemoglobin: 13.4 g/dL (ref 12.0–15.0)
MCH: 29.4 pg (ref 26.0–34.0)
MCHC: 33.1 g/dL (ref 30.0–36.0)
MCV: 88.8 fL (ref 80.0–100.0)
Platelets: 294 10*3/uL (ref 150–400)
RBC: 4.56 MIL/uL (ref 3.87–5.11)
RDW: 13.3 % (ref 11.5–15.5)
WBC: 7.6 10*3/uL (ref 4.0–10.5)
nRBC: 0 % (ref 0.0–0.2)

## 2020-08-19 LAB — RESP PANEL BY RT-PCR (FLU A&B, COVID) ARPGX2
Influenza A by PCR: NEGATIVE
Influenza B by PCR: NEGATIVE
SARS Coronavirus 2 by RT PCR: NEGATIVE

## 2020-08-19 LAB — LIPASE, BLOOD: Lipase: 26 U/L (ref 11–51)

## 2020-08-19 MED ORDER — METRONIDAZOLE 500 MG PO TABS: 500.0000 mg | ORAL_TABLET | Freq: Three times a day (TID) | ORAL | 0 refills | Status: DC

## 2020-08-19 MED ORDER — IOHEXOL 300 MG/ML  SOLN
100.0000 mL | Freq: Once | INTRAMUSCULAR | Status: AC | PRN
  Administered 2020-08-19: 100 mL via INTRAVENOUS
  Filled 2020-08-19: qty 100

## 2020-08-19 MED ORDER — NAPROXEN 500 MG PO TABS: 500.0000 mg | ORAL_TABLET | Freq: Two times a day (BID) | ORAL | 0 refills | Status: AC

## 2020-08-19 MED ORDER — NAPROXEN 500 MG PO TABS: 500.0000 mg | ORAL_TABLET | Freq: Two times a day (BID) | ORAL | 0 refills | Status: DC

## 2020-08-19 MED ORDER — ONDANSETRON 4 MG PO TBDP: 4.0000 mg | ORAL_TABLET | Freq: Three times a day (TID) | ORAL | 0 refills | Status: DC | PRN

## 2020-08-19 MED ORDER — METRONIDAZOLE 500 MG PO TABS: 500.0000 mg | ORAL_TABLET | Freq: Three times a day (TID) | ORAL | 0 refills | Status: AC

## 2020-08-19 MED ORDER — LACTATED RINGERS IV BOLUS
1000.0000 mL | Freq: Once | INTRAVENOUS | Status: AC
  Administered 2020-08-19: 1000 mL via INTRAVENOUS

## 2020-08-19 MED ORDER — ONDANSETRON HCL 4 MG/2ML IJ SOLN
4.0000 mg | Freq: Once | INTRAMUSCULAR | Status: AC
  Administered 2020-08-19: 4 mg via INTRAVENOUS
  Filled 2020-08-19: qty 2

## 2020-08-19 NOTE — ED Triage Notes (Signed)
Pt to ED via [facility] from home for nausea. Pt states that this has been going on over a week. Pt states that her appetite has been decreased. Pt states that she has not vomited but she has been dry heaving. Pt states that she saw her PCP last week and was started on antibiotic for UTI, pt states that she started her antibiotics yesterday. Pt is in NAD.

## 2020-08-19 NOTE — ED Provider Notes (Signed)
[facility] Emergency Department Provider Note  ____________________________________________  Time seen: Approximately 2:23 PM  I have reviewed the triage vital signs and the nursing notes.   HISTORY  Chief Complaint Nausea    HPI Tammy Hatfield is a 74 y.o. female with a history of diabetes, COPD, CAD, recent left knee replacement who reports having nausea since surgery which she attributes to oxycodone use.  However since going home from rehab [facility], she discontinued taking oxycodone and has still been having nausea and difficulty eating and drinking over the past week.  Also reports some diarrhea, body aches, chills, fatigue.  No known sick contacts.  She was started on doxycycline for a UTI by her doctor last week, but has since discontinued it, denies dysuria or flank pain.  Symptoms are constant, waxing and waning, no aggravating or alleviating factors.  She also notes altered sense of taste.      Past Medical History:  Diagnosis Date  . Anginal pain ([name])   . Anxiety   . Arthritis    knees  . Barrett's esophagus   . Bleeds easily ([name])   . Breast cancer ([name]) 2014   RT LUMPECTOMY  . Bruises easily   . Cancer [facility]) 2014   right breast - s\p Radiation and lumpectomy and LN dissection  . Chronic sinusitis   . Complication of anesthesia    hard to wake after Fentanyl  . COPD (chronic obstructive pulmonary disease) ([name]) 2012  . Coronary artery disease    4 STENTS  . Degenerative lumbar disc   . Diabetes mellitus without complication ([name])   . Dysphagia   . Dyspnea    WITH EXERTION  . Esophageal reflux   . Fatty liver   . History of Helicobacter pylori infection   . History of hiatal hernia   . Hypercholesteremia   . Hyperlipidemia   . Hypertension   . Personal history of radiation therapy   . Pneumonia   . Radiation 2014   RT BREAST CA  . Sleep apnea    NO CPAP-HAD A UPPP  . Ulcer, stomach peptic    history of  .  Vertigo    last episode 2017(approx)     Patient Active Problem List   Diagnosis Date Noted  . Status post total knee replacement using cement, left 07/21/2020  . Benign essential hypertension 01/28/2020  . Breast cancer ([place]) 01/28/2020  . Lumbar disc disease 01/28/2020  . Pure hypercholesterolemia 01/28/2020  . Statin intolerance 11/25/2019  . SOB (shortness of breath) on exertion 09/17/2019  . Unstable angina ([place]) 09/16/2018  . Long-term insulin use ([name]) 07/08/2018  . Uncontrolled type 2 diabetes mellitus with hyperglycemia ([name]) 07/08/2018  . Obesity (BMI 35.0-39.9 without comorbidity) 06/12/2018  . Fissure, anal 08/23/2017  . History of Barrett's esophagus 08/23/2017  . Other constipation 08/23/2017  . 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 Coronary artery disease involving native coronary artery of native heart without angina pectoris 07/10/2017  . Chest pain 07/05/2017  . S/P drug eluting coronary stent placement 07/05/2017  . Primary osteoarthritis of right knee 04/15/2017  . Primary cancer of upper inner quadrant of right female breast ([name]) 12/31/2016  . Sinus congestion 11/13/2016  . Diverticulosis of large intestine without hemorrhage 07/03/2016  . Lateral epicondylitis of right elbow 11/10/2015  . Left knee pain 10/20/2015  . Primary osteoarthritis of left knee 04/28/2015  . Status post rotator cuff  repair 03/10/2015  . CHF (congestive heart failure) ([name]) 02/11/2015  . OSA on CPAP 02/11/2015  . COPD (chronic obstructive pulmonary disease) ([name]) 02/11/2015  . Former smoker 02/11/2015  . HTN (hypertension) 02/11/2015  . Diabetes ([name]) 02/11/2015  . Rotator cuff rupture 02/11/2015  . Hyperlipidemia 02/11/2015  . GERD (gastroesophageal reflux disease) 02/11/2015  . PUD (peptic ulcer disease) 02/11/2015  . Dysphagia 02/11/2015  . ARF (acute renal failure) ([name]) 02/11/2015  . Impingement syndrome,  shoulder, right 07/27/2014     Past Surgical History:  Procedure Laterality Date  . ABDOMINAL HYSTERECTOMY    . BREAST BIOPSY Right 1985   NEG  . BREAST EXCISIONAL BIOPSY Right 2014   [name] and DCIS  . BREAST LUMPECTOMY Right 2014  . BUNIONECTOMY Bilateral 1980  . CATARACT EXTRACTION W/PHACO Left 04/01/2018   Procedure: CATARACT EXTRACTION PHACO AND INTRAOCULAR LENS PLACEMENT ([place]) LEFT DIABETES IVA TOPICAL;  Surgeon: [name], MD;  Location: [place];  Service: Ophthalmology;  Laterality: Left;  Diabetic - insulin and oral meds sleep apnea  . CATARACT EXTRACTION W/PHACO Right 04/30/2018   Procedure: CATARACT EXTRACTION PHACO AND INTRAOCULAR LENS PLACEMENT ([name]) RIGHT DIABETIC;  Surgeon: [name], MD;  Location: [place];  Service: Ophthalmology;  Laterality: Right;  Diabetic - insulin sleep apnea  . CHOLECYSTECTOMY    . COLONOSCOPY N/A 07/12/2020   Procedure: COLONOSCOPY;  Surgeon: [name], MD;  [facility] ENDOSCOPY;  Service: Endoscopy;  Laterality: N/A;  . CORONARY ANGIOPLASTY    . CORONARY STENT INTERVENTION N/A 07/05/2017   Procedure: CORONARY STENT INTERVENTION;  Surgeon: [name], MD;  Location: [place] CV [facility];  Service: Cardiovascular;  Laterality: N/A;  . ESOPHAGOGASTRODUODENOSCOPY N/A 07/12/2020   Procedure: ESOPHAGOGASTRODUODENOSCOPY (EGD);  Surgeon: [name], MD;  Location: [facility] ENDOSCOPY;  Service: Endoscopy;  Laterality: N/A;  . LAPAROSCOPIC TUBAL LIGATION Bilateral 1967  . LEFT HEART CATH AND CORONARY ANGIOGRAPHY N/A 07/05/2017   Procedure: LEFT HEART CATH AND CORONARY ANGIOGRAPHY;  Surgeon: [name], MD;  Location: [place] [facility];  Service: Cardiovascular;  Laterality: N/A;  . LEFT HEART CATH AND CORONARY ANGIOGRAPHY Left 09/18/2018   Procedure: Left Heart Cath w/ Coronary Angiography;  Surgeon: [name], MD;  Location: [place] [facility];  Service: Cardiovascular;   Laterality: Left;  . LEFT HEART CATH AND CORONARY ANGIOGRAPHY Left 02/25/2019   Procedure: LEFT HEART CATH AND CORONARY ANGIOGRAPHY;  Surgeon: Isaias Cowman, MD;  Location: [place] [facility];  Service: Cardiovascular;  Laterality: Left;  . SHOULDER ARTHROSCOPY WITH ROTATOR CUFF REPAIR Right 02/09/2015   Procedure: SHOULDER ARTHROSCOPY WITH ROTATOR CUFF REPAIR,release long head biceps tendon,subacromial decompression.;  Surgeon: Leanor Kail, MD;  Location: [facility];  Service: Orthopedics;  Laterality: Right;  . TOTAL KNEE ARTHROPLASTY Left 07/21/2020   Procedure: TOTAL KNEE ARTHROPLASTY;  Surgeon: Corky Mull, MD;  Location: [facility];  Service: Orthopedics;  Laterality: Left;  . UMBILICAL HERNIA REPAIR N/A   . UVULOPALATOPHARYNGOPLASTY       Prior to Admission medications   Medication Sig Start Date End Date Taking? Authorizing Provider  aspirin EC 81 MG tablet Take 81 mg by mouth daily.    [provider]  calcium carbonate (TUMS - DOSED IN MG ELEMENTAL CALCIUM) 500 MG chewable tablet Chew 1,500 mg by mouth daily as needed for indigestion or heartburn.    [provider]  Carboxymethylcellul-Glycerin (LUBRICATING EYE DROPS OP) Place 1 drop into both eyes daily as needed (dry eyes).  [provider]  Cholecalciferol (VITAMIN D3) 25 MCG (1000 UT) CAPS Take 1,000 Units by mouth daily.     [provider]  cyanocobalamin 1000 MCG tablet Take 1,000 mcg by mouth daily.     [provider]  enoxaparin (LOVENOX) 40 MG/0.4ML injection Inject 0.4 mLs (40 mg total) into the skin daily. 07/25/20   Lattie Corns, PA-C  fexofenadine (ALLEGRA) 180 MG tablet Take 180 mg by mouth daily as needed for allergies.     [provider]  ibuprofen (ADVIL,MOTRIN) 200 MG tablet Take 400 mg by mouth daily as needed for headache or moderate pain.     [provider]  insulin aspart (NOVOLOG) 100 UNIT/ML injection Inject 3 Units into the  skin 3 (three) times daily with meals. 07/26/20   Lattie Corns, PA-C  insulin degludec (TRESIBA FLEXTOUCH) 200 UNIT/ML FlexTouch Pen Inject 66 Units into the skin at bedtime.  01/26/20   [provider]  isosorbide mononitrate (IMDUR) 30 MG 24 hr tablet Take 30 mg by mouth every morning.  12/22/19 12/21/20  [provider]  losartan (COZAAR) 50 MG tablet Take 50 mg by mouth every morning.     [provider]  metoprolol succinate (TOPROL-XL) 50 MG 24 hr tablet Take 50 mg by mouth every morning. Take with or immediately following a meal.     [provider]  metroNIDAZOLE (FLAGYL) 500 MG tablet Take 1 tablet (500 mg total) by mouth 3 (three) times daily for 7 days. 08/19/20 08/26/20  Carrie Mew, MD  naproxen (NAPROSYN) 500 MG tablet Take 1 tablet (500 mg total) by mouth 2 (two) times daily with a meal for 3 days. 08/19/20 08/22/20  Carrie Mew, MD  nitroGLYCERIN (NITROSTAT) 0.4 MG SL tablet Place 1 tablet (0.4 mg total) under the tongue every 5 (five) minutes as needed for chest pain. 07/06/17   Bettey Costa, MD  ondansetron (ZOFRAN ODT) 4 MG disintegrating tablet Take 1 tablet (4 mg total) by mouth every 8 (eight) hours as needed for nausea or vomiting. 08/19/20   Carrie Mew, MD  ondansetron (ZOFRAN) 4 MG tablet Take 1 tablet (4 mg total) by mouth every 6 (six) hours as needed for nausea. 07/25/20   Lattie Corns, PA-C  [facility] VERIO test strip 1 each 3 (three) times daily. 12/22/19   [provider]  oxyCODONE (OXY IR/ROXICODONE) 5 MG immediate release tablet Take 1-2 tablets (5-10 mg total) by mouth every 4 (four) hours as needed for moderate pain. 07/25/20   Lattie Corns, PA-C  pantoprazole (PROTONIX) 20 MG tablet Take 20 mg by mouth every morning.     [provider]  PARoxetine (PAXIL) 20 MG tablet Take 20 mg by mouth every morning.     [provider]  pregabalin (LYRICA) 75 MG capsule Take 1  capsule (75 mg total) by mouth 2 (two) times daily. 07/25/20   Lattie Corns, PA-C  Semaglutide,0.25 or 0.5MG /DOS, 2 MG/1.5ML SOPN Inject 1 mg as directed every Sunday.  01/26/20   [provider]  traMADol (ULTRAM) 50 MG tablet Take 1 tablet (50 mg total) by mouth every 6 (six) hours as needed for moderate pain. 07/25/20   Lattie Corns, PA-C  valACYclovir (VALTREX) 500 MG tablet Take 500 mg by mouth 2 (two) times daily as needed (fever blisters).  02/22/17   [provider]     Allergies Avelox [moxifloxacin], Donepezil, Amoxicillin, Benazepril, Bupropion, [place]etta 10 mcg pen [exenatide], Isosorbide, Metformin, Neurontin [  gabapentin], Pantoprazole, Sulfa antibiotics, and Ceclor [cefaclor]   Family History  Problem Relation Age of Onset  . Breast cancer Neg Hx     Social History Social History   Tobacco Use  . Smoking status: Former Smoker    Packs/day: 2.00    Years: 30.00    Pack years: 60.00    Quit date: 02/01/2000    Years since quitting: 20.5  . Smokeless tobacco: Never Used  . Tobacco comment: 07/18/2017 former smoker quit 1999  Vaping Use  . Vaping Use: Never used  Substance Use Topics  . Alcohol use: No    Alcohol/week: 0.0 standard drinks    Comment: may have drink on Holidays  . Drug use: No    Review of Systems  Constitutional:   No fever positive chills.  ENT:   No sore throat. No rhinorrhea. Cardiovascular:   No chest pain or syncope. Respiratory:   No dyspnea or cough. Gastrointestinal:   Positive left-sided abdominal pain, nausea, diarrhea Musculoskeletal:   Negative for focal pain or swelling All other systems reviewed and are negative except as documented above in ROS and HPI.  ____________________________________________   PHYSICAL EXAM:  VITAL SIGNS: ED Triage Vitals  Enc Vitals Group     BP 08/19/20 1224 92/67     Pulse Rate 08/19/20 1224 100     Resp 08/19/20 1224 16     Temp 08/19/20 1224 97.7 F (36.5 C)      Temp Source 08/19/20 1224 Oral     SpO2 08/19/20 1224 97 %     Weight 08/19/20 1226 200 lb (90.7 kg)     Height 08/19/20 1226 5\' 4"  (1.626 m)     Head Circumference --      Peak Flow --      Pain Score 08/19/20 1226 0     Pain Loc --      Pain Edu? --      Excl. in [place]? --     Vital signs reviewed, nursing assessments reviewed.   Constitutional:   Alert and oriented. Non-toxic appearance. Eyes:   Conjunctivae are normal. EOMI. PERRL. ENT      Head:   Normocephalic and atraumatic.      Nose:   Wearing a mask.      Mouth/Throat:   Wearing a mask.      Neck:   No meningismus. Full ROM. Hematological/Lymphatic/Immunilogical:   No cervical lymphadenopathy. Cardiovascular:   RRR. Symmetric bilateral radial and DP pulses.  No murmurs. Cap refill less than 2 seconds. Respiratory:   Normal respiratory effort without tachypnea/retractions. Breath sounds are clear and equal bilaterally. No wheezes/rales/rhonchi. Gastrointestinal:   Soft with left-sided abdominal tenderness. Non distended. There is no CVA tenderness.  No rebound, rigidity, or guarding. Musculoskeletal:   Normal range of motion in all extremities. No joint effusions.  No lower extremity tenderness.  No edema. Neurologic:   Normal speech and language.  Motor grossly intact. No acute focal neurologic deficits are appreciated.  Skin:    Skin is warm, dry and intact. No rash noted.  No petechiae, purpura, or bullae.  ____________________________________________    LABS (pertinent positives/negatives) (all labs ordered are listed, but only abnormal results are displayed) Labs Reviewed  COMPREHENSIVE METABOLIC PANEL - Abnormal; Notable for the following components:      Result Value   Sodium 134 (*)    Chloride 96 (*)    Glucose, Bld 210 (*)    Creatinine, Ser 1.05 (*)  AST 14 (*)    GFR, Estimated 56 (*)    All other components within normal limits  URINALYSIS, COMPLETE (UACMP) WITH MICROSCOPIC - Abnormal; Notable for  the following components:   Color, Urine YELLOW (*)    APPearance CLEAR (*)    Specific Gravity, Urine 1.039 (*)    Leukocytes,Ua TRACE (*)    All other components within normal limits  RESP PANEL BY RT-PCR (FLU A&B, COVID) ARPGX2  LIPASE, BLOOD  CBC   ____________________________________________   EKG    ____________________________________________    RADIOLOGY  CT ABDOMEN PELVIS W CONTRAST  Result Date: 08/19/2020 CLINICAL DATA:  Nausea for 1 week EXAM: CT ABDOMEN AND PELVIS WITH CONTRAST TECHNIQUE: Multidetector CT imaging of the abdomen and pelvis was performed using the standard protocol following bolus administration of intravenous contrast. CONTRAST:  119mL OMNIPAQUE IOHEXOL 300 MG/ML  SOLN COMPARISON:  01/05/2020 FINDINGS: Lower chest: No acute abnormality.  Small hiatal hernia. Hepatobiliary: No focal liver abnormality is seen. Status post cholecystectomy. No biliary dilatation. Pancreas: Unremarkable. No pancreatic ductal dilatation or surrounding inflammatory changes. Spleen: Normal in size without significant abnormality. Adrenals/Urinary Tract: Adrenal glands are unremarkable. Kidneys are normal, without renal calculi, solid lesion, or hydronephrosis. Bladder is unremarkable. Stomach/Bowel: Stomach is within normal limits. Appendix appears normal. Descending and sigmoid diverticulosis. There is mild focal fat stranding about a diverticulum of the superior descending colon (series 2, image 40). Vascular/Lymphatic: Aortic atherosclerosis. No enlarged abdominal or pelvic lymph nodes. Reproductive: Status post hysterectomy. Other: No abdominal wall hernia or abnormality. No abdominopelvic ascites. Musculoskeletal: No acute or significant osseous findings. IMPRESSION: 1. Descending and sigmoid diverticulosis with mild focal fat stranding about a diverticulum of the superior descending colon, consistent with mild diverticulitis. No evidence of perforation or abscess. 2. Small hiatal  hernia. 3. Status post cholecystectomy and hysterectomy. Aortic Atherosclerosis (ICD10-I70.0). Electronically Signed   By: Eddie Candle M.D.   On: 08/19/2020 15:20    ____________________________________________   PROCEDURES Procedures  ____________________________________________  DIFFERENTIAL DIAGNOSIS   Diverticulitis, viral syndrome/COVID-19, constipation, dehydration, GERD  CLINICAL IMPRESSION / ASSESSMENT AND PLAN / ED COURSE  Medications ordered in the ED: Medications  lactated ringers bolus 1,000 mL (1,000 mLs Intravenous New Bag/Given 08/19/20 1408)  ondansetron (ZOFRAN) injection 4 mg (4 mg Intravenous Given 08/19/20 1409)  iohexol (OMNIPAQUE) 300 MG/ML solution 100 mL (100 mLs Intravenous Contrast Given 08/19/20 1446)    Pertinent labs & imaging results that were available during my care of the patient were reviewed by me and considered in my medical decision making (see chart for details).  Tammy Hatfield was evaluated in Emergency Department on 08/19/2020 for the symptoms described in the history of present illness. She was evaluated in the context of the global COVID-19 pandemic, which necessitated consideration that the patient might be at risk for infection with the SARS-CoV-2 virus that causes COVID-19. Institutional protocols and algorithms that pertain to the evaluation of patients at risk for COVID-19 are in a state of rapid change based on information released by regulatory bodies including the CDC and federal and state organizations. These policies and algorithms were followed during the patient's care in the ED.   Patient presents with nausea, abdominal tenderness.  Vital signs unremarkable, but with age, comorbidities, duration of symptoms and tenderness on exam, will need to obtain a CT scan of the abdomen pelvis.  Will give IV fluids for hydration and IV Zofran for the nausea.  Clinical Course as of 08/19/20 1647  Fri Aug 19, 2020  1641 Patient feels much  better.  Labs reassuring, urinalysis negative.  Covid negative.  CT essentially negative but suggestive of possible mild diverticulitis.  With her tenderness and nausea, will treat with a course of antibiotics and Zofran.  Stable for discharge to outpatient follow-up. [PS]    Clinical Course User Index [PS] Carrie Mew, MD     ____________________________________________   FINAL CLINICAL IMPRESSION(S) / ED DIAGNOSES    Final diagnoses:  Nausea  Dehydration     ED Discharge Orders         Ordered    metroNIDAZOLE (FLAGYL) 500 MG tablet  3 times daily        08/19/20 1646    ondansetron (ZOFRAN ODT) 4 MG disintegrating tablet  Every 8 hours PRN        08/19/20 1646    naproxen (NAPROSYN) 500 MG tablet  2 times daily with meals        08/19/20 1646          Portions of this note were generated with dragon dictation software. Dictation errors may occur despite best attempts at proofreading.   Carrie Mew, MD 08/19/20 [phone]

## 2020-09-05 ENCOUNTER — Ambulatory Visit
Admission: RE | Admit: 2020-09-05 | Discharge: 2020-09-05 | Disposition: A | Discharge status: Home / Self Care | Payer: Medicare HMO | Source: Ambulatory Visit | Attending: Surgery | Admitting: Surgery

## 2020-09-05 ENCOUNTER — Other Ambulatory Visit: Payer: Self-pay | Admitting: Surgery

## 2020-09-05 ENCOUNTER — Other Ambulatory Visit: Payer: Self-pay

## 2020-09-05 DIAGNOSIS — R0602 Shortness of breath: Secondary | ICD-10-CM | POA: Insufficient documentation

## 2020-09-05 DIAGNOSIS — M7989 Other specified soft tissue disorders: Secondary | ICD-10-CM

## 2020-09-14 DIAGNOSIS — I872 Venous insufficiency (chronic) (peripheral): Secondary | ICD-10-CM | POA: Insufficient documentation

## 2020-09-14 DIAGNOSIS — I82409 Acute embolism and thrombosis of unspecified deep veins of unspecified lower extremity: Secondary | ICD-10-CM | POA: Insufficient documentation

## 2020-09-14 NOTE — Progress Notes (Signed)
MRN : [id number]  Tammy Hatfield is a 75 y.o. ([birth year]) female who presents with chief complaint of No chief complaint on file. Marland Kitchen  History of Present Illness:   The patient presents to the office for evaluation of DVT.  DVT was identified at [facility] by Duplex ultrasound.  The initial symptoms were pain and left lower extremity swelling after knee replacement 07/21/2020.  Duplex ultrasound obtained for evaluation of this problem demonstrated nonocclusive thrombus in the left common femoral vein.  The patient notes the leg continues to be very painful with dependency and swells quite a bite.  However, she also notes her left leg pain extends up to her hip and to her lower back and that it wakes her up at night.  Symptoms are much better with elevation.  The patient notes minimal edema in the morning which steadily worsens throughout the day.    The patient has not been using compression therapy at this point.  No SOB or pleuritic chest pains.  No cough or hemoptysis.  No blood per rectum or blood in any sputum.  No excessive bruising per the patient.  Of note the patient has been evaluated prior for venous insufficiency and painful varicose veins.  Prior duplex ultrasound in this office demonstrated patent deep venous system with reflux in the great saphenous vein.  No outpatient medications have been marked as taking for the 09/15/20 encounter (Appointment) with Delana Meyer, Dolores Lory, MD.    Past Medical History:  Diagnosis Date  . Anginal pain ([place])   . Anxiety   . Arthritis    knees  . Barrett's esophagus   . Bleeds easily ([place])   . Breast cancer ([place]) 2014   RT LUMPECTOMY  . Bruises easily   . Cancer [facility]) 2014   right breast - s\p Radiation and lumpectomy and LN dissection  . Chronic sinusitis   . Complication of anesthesia    hard to wake after Fentanyl  . COPD (chronic obstructive pulmonary disease) ([place]) 2012  . Coronary artery disease    4 STENTS  . Degenerative  lumbar disc   . Diabetes mellitus without complication ([place])   . Dysphagia   . Dyspnea    WITH EXERTION  . Esophageal reflux   . Fatty liver   . History of Helicobacter pylori infection   . History of hiatal hernia   . Hypercholesteremia   . Hyperlipidemia   . Hypertension   . Personal history of radiation therapy   . Pneumonia   . Radiation 2014   RT BREAST CA  . Sleep apnea    NO CPAP-HAD A UPPP  . Ulcer, stomach peptic    history of  . Vertigo    last episode 2017(approx)    Past Surgical History:  Procedure Laterality Date  . ABDOMINAL HYSTERECTOMY    . BREAST BIOPSY Right 1985   NEG  . BREAST EXCISIONAL BIOPSY Right 2014   [facility] and DCIS  . BREAST LUMPECTOMY Right 2014  . BUNIONECTOMY Bilateral 1980  . CATARACT EXTRACTION W/PHACO Left 04/01/2018   Procedure: CATARACT EXTRACTION PHACO AND INTRAOCULAR LENS PLACEMENT ([place]) LEFT DIABETES IVA TOPICAL;  Surgeon: [name], MD;  Location: [place];  Service: Ophthalmology;  Laterality: Left;  Diabetic - insulin and oral meds sleep apnea  . CATARACT EXTRACTION W/PHACO Right 04/30/2018   Procedure: CATARACT EXTRACTION PHACO AND INTRAOCULAR LENS PLACEMENT ([place]) RIGHT DIABETIC;  Surgeon: [name], MD;  Location: [place];  Service: Ophthalmology;  Laterality: Right;  Diabetic - insulin sleep apnea  . CHOLECYSTECTOMY    . COLONOSCOPY N/A 07/12/2020   Procedure: COLONOSCOPY;  Surgeon: [name], MD;  Location: [facility] ENDOSCOPY;  Service: Endoscopy;  Laterality: N/A;  . CORONARY ANGIOPLASTY    . CORONARY STENT INTERVENTION N/A 07/05/2017   Procedure: CORONARY STENT INTERVENTION;  Surgeon: [name], MD;  Location: [place] CV LAB;  Service: Cardiovascular;  Laterality: N/A;  . ESOPHAGOGASTRODUODENOSCOPY N/A 07/12/2020   Procedure: ESOPHAGOGASTRODUODENOSCOPY (EGD);  Surgeon: [name], MD;  Location: [facility] ENDOSCOPY;  Service: Endoscopy;  Laterality: N/A;  .  LAPAROSCOPIC TUBAL LIGATION Bilateral 1967  . LEFT HEART CATH AND CORONARY ANGIOGRAPHY N/A 07/05/2017   Procedure: LEFT HEART CATH AND CORONARY ANGIOGRAPHY;  Surgeon: [name], MD;  Location: [place] [facility];  Service: Cardiovascular;  Laterality: N/A;  . LEFT HEART CATH AND CORONARY ANGIOGRAPHY Left 09/18/2018   Procedure: Left Heart Cath w/ Coronary Angiography;  Surgeon: [name], MD;  Location: [place] [facility];  Service: Cardiovascular;  Laterality: Left;  . LEFT HEART CATH AND CORONARY ANGIOGRAPHY Left 02/25/2019   Procedure: LEFT HEART CATH AND CORONARY ANGIOGRAPHY;  Surgeon: Isaias Cowman, MD;  Location: [place] [facility];  Service: Cardiovascular;  Laterality: Left;  . SHOULDER ARTHROSCOPY WITH ROTATOR CUFF REPAIR Right 02/09/2015   Procedure: SHOULDER ARTHROSCOPY WITH ROTATOR CUFF REPAIR,release long head biceps tendon,subacromial decompression.;  Surgeon: Leanor Kail, MD;  Location: [facility];  Service: Orthopedics;  Laterality: Right;  . TOTAL KNEE ARTHROPLASTY Left 07/21/2020   Procedure: TOTAL KNEE ARTHROPLASTY;  Surgeon: Corky Mull, MD;  Location: [facility];  Service: Orthopedics;  Laterality: Left;  . UMBILICAL HERNIA REPAIR N/A   . UVULOPALATOPHARYNGOPLASTY      Social History Social History   Tobacco Use  . Smoking status: Former Smoker    Packs/day: 2.00    Years: 30.00    Pack years: 60.00    Quit date: 02/01/2000    Years since quitting: 20.6  . Smokeless tobacco: Never Used  . Tobacco comment: 07/18/2017 former smoker quit 1999  Vaping Use  . Vaping Use: Never used  Substance Use Topics  . Alcohol use: No    Alcohol/week: 0.0 standard drinks    Comment: may have drink on Holidays  . Drug use: No    Family History Family History  Problem Relation Age of Onset  . Breast cancer Neg Hx     Allergies  Allergen Reactions  . Avelox [Moxifloxacin] Other (See Comments)    debilitating muscle pain  . Donepezil Other (See  Comments)    Patient has severe nightmares   . Amoxicillin Other (See Comments)    Yeast infection - tolerated cefazolin before DID THE REACTION INVOLVE: Swelling of the face/tongue/throat, SOB, or low BP? No Sudden or severe rash/hives, skin peeling, or the inside of the mouth or nose? No Did it require medical treatment? No When did it last happen?within the past 10 years If all above answers are "NO", may proceed with cephalosporin use.  . Benazepril Cough  . Bupropion     Dizziness, Headache  . Byetta 10 Mcg Pen [Exenatide] Diarrhea  . Isosorbide Swelling    Headaches, issues with balance  . Metformin Diarrhea  . Neurontin [Gabapentin] Other (See Comments)    Mouth blisters, joint pain, depression   . Pantoprazole      Abdominal Pain  . Sulfa Antibiotics Other (See Comments)    Headache  . Ceclor [Cefaclor]  Rash    Tolerated cefazolin before.     REVIEW OF SYSTEMS (Negative unless checked)  Constitutional: [] Weight loss  [] Fever  [] Chills Cardiac: [] Chest pain   [] Chest pressure   [] Palpitations   [] Shortness of breath when laying flat   [] Shortness of breath with exertion. Vascular:  [] Pain in legs with walking   [x] Pain in legs at rest  [x] History of DVT   [] Phlebitis   [] Swelling in legs   [x] Varicose veins   [] Non-healing ulcers Pulmonary:   [] Uses home oxygen   [] Productive cough   [] Hemoptysis   [] Wheeze  [] COPD   [] Asthma Neurologic:  [] Dizziness   [] Seizures   [] History of stroke   [] History of TIA  [] Aphasia   [] Vissual changes   [] Weakness or numbness in arm   [] Weakness or numbness in leg Musculoskeletal:   [] Joint swelling   [x] Joint pain   [x] Low back pain Hematologic:  [] Easy bruising  [] Easy bleeding   [] Hypercoagulable state   [] Anemic Gastrointestinal:  [] Diarrhea   [] Vomiting  [] Gastroesophageal reflux/heartburn   [] Difficulty swallowing. Genitourinary:  [] Chronic kidney disease   [] Difficult urination  [] Frequent urination   [] Blood in urine Skin:   [] Rashes   [] Ulcers  Psychological:  [] History of anxiety   []  History of major depression.  Physical Examination  There were no vitals filed for this visit. There is no height or weight on file to calculate BMI. Gen: WD/WN, NAD Head: [place]/AT, No temporalis wasting.  Ear/Nose/Throat: Hearing grossly intact, nares w/o erythema or drainage Eyes: PER, EOMI, sclera nonicteric.  Neck: Supple, no large masses.   Pulmonary:  Good air movement, no audible wheezing bilaterally, no use of accessory muscles.  Cardiac: RRR, no JVD Vascular: scattered varicosities present bilaterally.  Mild to moderate venous stasis changes to the legs bilaterally.  1-2+ soft pitting edema Vessel Right Left  Radial Palpable Palpable  PT Palpable Palpable  DP Palpable Palpable  Gastrointestinal: Non-distended. No guarding/no peritoneal signs.  Musculoskeletal: M/S 5/5 throughout.  No deformity or atrophy.  Neurologic: CN 2-12 intact. Symmetrical.  Speech is fluent. Motor exam as listed above. Psychiatric: Judgment intact, Mood & affect appropriate for pt's clinical situation. Dermatologic: Mild to moderate venous rashes or ulcers noted.  No changes consistent with cellulitis.  CBC Lab Results  Component Value Date   WBC 7.6 08/19/2020   HGB 13.4 08/19/2020   HCT 40.5 08/19/2020   MCV 88.8 08/19/2020   PLT 294 08/19/2020    BMET    Component Value Date/Time   NA 134 (L) 08/19/2020 1230   NA 135 (L) 05/28/2013 1527   K 5.0 08/19/2020 1230   K 4.5 05/28/2013 1527   CL 96 (L) 08/19/2020 1230   CL 104 05/28/2013 1527   CO2 27 08/19/2020 1230   CO2 30 05/28/2013 1527   GLUCOSE 210 (H) 08/19/2020 1230   GLUCOSE 112 (H) 05/28/2013 1527   BUN 20 08/19/2020 1230   BUN 17 05/28/2013 1527   CREATININE 1.05 (H) 08/19/2020 1230   CREATININE 0.82 05/28/2013 1527   CALCIUM 9.7 08/19/2020 1230   CALCIUM 8.9 05/28/2013 1527   GFRNONAA 56 (L) 08/19/2020 1230   GFRNONAA >60 05/28/2013 1527   GFRAA >60 07/06/2017  0627   GFRAA >60 05/28/2013 1527   CrCl cannot be calculated (Patient's most recent lab result is older than the maximum 21 days allowed.).  COAG Lab Results  Component Value Date   INR 1.07 02/13/2015   INR 1.20 02/12/2015   INR 1.19 02/11/2015  Radiology CT ABDOMEN PELVIS W CONTRAST  Result Date: 08/19/2020 CLINICAL DATA:  Nausea for 1 week EXAM: CT ABDOMEN AND PELVIS WITH CONTRAST TECHNIQUE: Multidetector CT imaging of the abdomen and pelvis was performed using the standard protocol following bolus administration of intravenous contrast. CONTRAST:  156mL OMNIPAQUE IOHEXOL 300 MG/ML  SOLN COMPARISON:  01/05/2020 FINDINGS: Lower chest: No acute abnormality.  Small hiatal hernia. Hepatobiliary: No focal liver abnormality is seen. Status post cholecystectomy. No biliary dilatation. Pancreas: Unremarkable. No pancreatic ductal dilatation or surrounding inflammatory changes. Spleen: Normal in size without significant abnormality. Adrenals/Urinary Tract: Adrenal glands are unremarkable. Kidneys are normal, without renal calculi, solid lesion, or hydronephrosis. Bladder is unremarkable. Stomach/Bowel: Stomach is within normal limits. Appendix appears normal. Descending and sigmoid diverticulosis. There is mild focal fat stranding about a diverticulum of the superior descending colon (series 2, image 40). Vascular/Lymphatic: Aortic atherosclerosis. No enlarged abdominal or pelvic lymph nodes. Reproductive: Status post hysterectomy. Other: No abdominal wall hernia or abnormality. No abdominopelvic ascites. Musculoskeletal: No acute or significant osseous findings. IMPRESSION: 1. Descending and sigmoid diverticulosis with mild focal fat stranding about a diverticulum of the superior descending colon, consistent with mild diverticulitis. No evidence of perforation or abscess. 2. Small hiatal hernia. 3. Status post cholecystectomy and hysterectomy. Aortic Atherosclerosis (ICD10-I70.0). Electronically Signed    By: Eddie Candle M.D.   On: 08/19/2020 15:20   US Venous Img Lower Unilateral Left (DVT)  Result Date: 09/05/2020 CLINICAL DATA:  Short of breath, left lower extremity swelling after knee replacement 07/21/2020 EXAM: LEFT LOWER EXTREMITY VENOUS DOPPLER ULTRASOUND TECHNIQUE: Gray-scale sonography with compression, as well as color and duplex ultrasound, were performed to evaluate the deep venous system(s) from the level of the common femoral vein through the popliteal and proximal calf veins. COMPARISON:  09/24/2017 FINDINGS: VENOUS Sonographic evaluation of the venous structures of the left lower extremity are obtained. There is nonocclusive thrombus extending from the left common femoral vein, through the femoral vein, and into the popliteal vein. These vessels are incompletely compressible, with echogenic eccentrically located mural thrombus. Partial color flow is identified. The calf veins are patent. Limited views of the contralateral common femoral vein are unremarkable. OTHER None. Limitations: none IMPRESSION: 1. Nonocclusive deep venous thrombosis extending from the left common femoral through the left popliteal vein. These results will be called to the ordering clinician or representative by the Radiologist Assistant, and communication documented in the PACS or [employer]. Electronically Signed   By: Randa Ngo M.D.   On: 09/05/2020 17:34     Assessment/Plan 1. Acute deep vein thrombosis (DVT) of femoral vein of left lower extremity (HCC) Recommend:   No surgery or intervention at this point in time.  IVC filter is not indicated at present.  Patient's duplex ultrasound of the venous system shows DVT from the popliteal to the femoral veins.  The patient is initiated on anticoagulation   Elevation was stressed, use of a recliner was discussed.  I have had a long discussion with the patient regarding DVT and post phlebitic changes such as swelling and why it  causes symptoms  such as pain.  The patient will wear graduated compression stockings class 1 (20-30 mmHg), beginning after three full days of anticoagulation, on a daily basis a prescription was given. The patient will  beginning wearing the stockings first thing in the morning and removing them in the evening. The patient is instructed specifically not to sleep in the stockings.  In addition, behavioral modification including elevation during  the day and avoidance of prolonged dependency will be initiated.    The patient will continue anticoagulation for now as there have not been any problems or complications at this point.   - VAS [place] LOWER EXTREMITY VENOUS (DVT); Future  2. Chronic venous insufficiency No surgery or intervention at this point in time.    I have had a long discussion with the patient regarding venous insufficiency and why it  causes symptoms. I have discussed with the patient the chronic skin changes that accompany venous insufficiency and the long term sequela such as infection and ulceration.  Patient will begin wearing graduated compression stockings class 1 (20-30 mmHg) or compression wraps on a daily basis a prescription was given. The patient will put the stockings on first thing in the morning and removing them in the evening. The patient is instructed specifically not to sleep in the stockings.    In addition, behavioral modification including several periods of elevation of the lower extremities during the day will be continued. I have demonstrated that proper elevation is a position with the ankles at heart level.  The patient is instructed to begin routine exercise, especially walking on a daily basis   3. Primary hypertension Continue antihypertensive medications as already ordered, these medications have been reviewed and there are no changes at this time.   4. Coronary artery disease of native artery of native heart with stable angina pectoris (HCC) Continue cardiac and  antihypertensive medications as already ordered and reviewed, no changes at this time.  Continue statin as ordered and reviewed, no changes at this time  Nitrates PRN for chest pain     Hortencia Pilar, MD  09/14/2020 4:32 PM

## 2020-09-15 ENCOUNTER — Encounter (INDEPENDENT_AMBULATORY_CARE_PROVIDER_SITE_OTHER): Payer: Self-pay | Admitting: Vascular Surgery

## 2020-09-15 ENCOUNTER — Ambulatory Visit (INDEPENDENT_AMBULATORY_CARE_PROVIDER_SITE_OTHER): Payer: Medicare HMO | Admitting: Vascular Surgery

## 2020-09-15 ENCOUNTER — Other Ambulatory Visit: Payer: Self-pay

## 2020-09-15 VITALS — BP 89/54 | HR 93 | Ht 64.0 in | Wt 195.0 lb

## 2020-09-15 DIAGNOSIS — I82412 Acute embolism and thrombosis of left femoral vein: Secondary | ICD-10-CM

## 2020-09-15 DIAGNOSIS — I10 Essential (primary) hypertension: Secondary | ICD-10-CM | POA: Diagnosis not present

## 2020-09-15 DIAGNOSIS — I872 Venous insufficiency (chronic) (peripheral): Secondary | ICD-10-CM | POA: Diagnosis not present

## 2020-09-15 DIAGNOSIS — I25118 Atherosclerotic heart disease of native coronary artery with other forms of angina pectoris: Secondary | ICD-10-CM

## 2020-12-14 ENCOUNTER — Emergency Department: Payer: Medicare HMO

## 2020-12-14 ENCOUNTER — Encounter: Payer: Self-pay | Admitting: Emergency Medicine

## 2020-12-14 ENCOUNTER — Observation Stay
Admission: EM | Admit: 2020-12-14 | Discharge: 2020-12-17 | Disposition: A | Discharge status: Home / Self Care | Payer: Medicare HMO | Attending: Internal Medicine | Admitting: Internal Medicine

## 2020-12-14 ENCOUNTER — Other Ambulatory Visit: Payer: Self-pay

## 2020-12-14 DIAGNOSIS — Z87891 Personal history of nicotine dependence: Secondary | ICD-10-CM | POA: Diagnosis not present

## 2020-12-14 DIAGNOSIS — I110 Hypertensive heart disease with heart failure: Secondary | ICD-10-CM | POA: Diagnosis not present

## 2020-12-14 DIAGNOSIS — E119 Type 2 diabetes mellitus without complications: Secondary | ICD-10-CM

## 2020-12-14 DIAGNOSIS — R109 Unspecified abdominal pain: Secondary | ICD-10-CM

## 2020-12-14 DIAGNOSIS — I509 Heart failure, unspecified: Secondary | ICD-10-CM | POA: Insufficient documentation

## 2020-12-14 DIAGNOSIS — Z853 Personal history of malignant neoplasm of breast: Secondary | ICD-10-CM

## 2020-12-14 DIAGNOSIS — Z955 Presence of coronary angioplasty implant and graft: Secondary | ICD-10-CM | POA: Diagnosis not present

## 2020-12-14 DIAGNOSIS — N10 Acute tubulo-interstitial nephritis: Secondary | ICD-10-CM

## 2020-12-14 DIAGNOSIS — Z20822 Contact with and (suspected) exposure to covid-19: Secondary | ICD-10-CM | POA: Diagnosis not present

## 2020-12-14 DIAGNOSIS — Z7982 Long term (current) use of aspirin: Secondary | ICD-10-CM | POA: Insufficient documentation

## 2020-12-14 DIAGNOSIS — Z7901 Long term (current) use of anticoagulants: Secondary | ICD-10-CM

## 2020-12-14 DIAGNOSIS — Z794 Long term (current) use of insulin: Secondary | ICD-10-CM | POA: Diagnosis not present

## 2020-12-14 DIAGNOSIS — I2510 Atherosclerotic heart disease of native coronary artery without angina pectoris: Secondary | ICD-10-CM | POA: Insufficient documentation

## 2020-12-14 DIAGNOSIS — R1084 Generalized abdominal pain: Principal | ICD-10-CM | POA: Insufficient documentation

## 2020-12-14 DIAGNOSIS — Z79899 Other long term (current) drug therapy: Secondary | ICD-10-CM | POA: Diagnosis not present

## 2020-12-14 DIAGNOSIS — Z96652 Presence of left artificial knee joint: Secondary | ICD-10-CM | POA: Diagnosis not present

## 2020-12-14 DIAGNOSIS — J449 Chronic obstructive pulmonary disease, unspecified: Secondary | ICD-10-CM | POA: Diagnosis not present

## 2020-12-14 DIAGNOSIS — N12 Tubulo-interstitial nephritis, not specified as acute or chronic: Secondary | ICD-10-CM

## 2020-12-14 DIAGNOSIS — I10 Essential (primary) hypertension: Secondary | ICD-10-CM | POA: Diagnosis present

## 2020-12-14 DIAGNOSIS — C787 Secondary malignant neoplasm of liver and intrahepatic bile duct: Secondary | ICD-10-CM

## 2020-12-14 LAB — CBC
HCT: 37.8 % (ref 36.0–46.0)
Hemoglobin: 12.2 g/dL (ref 12.0–15.0)
MCH: 29.2 pg (ref 26.0–34.0)
MCHC: 32.3 g/dL (ref 30.0–36.0)
MCV: 90.4 fL (ref 80.0–100.0)
Platelets: 208 10*3/uL (ref 150–400)
RBC: 4.18 MIL/uL (ref 3.87–5.11)
RDW: 13.9 % (ref 11.5–15.5)
WBC: 8.8 10*3/uL (ref 4.0–10.5)
nRBC: 0 % (ref 0.0–0.2)

## 2020-12-14 LAB — COMPREHENSIVE METABOLIC PANEL
ALT: 70 U/L — ABNORMAL HIGH (ref 0–44)
AST: 84 U/L — ABNORMAL HIGH (ref 15–41)
Albumin: 3.8 g/dL (ref 3.5–5.0)
Alkaline Phosphatase: 344 U/L — ABNORMAL HIGH (ref 38–126)
Anion gap: 10 (ref 5–15)
BUN: 15 mg/dL (ref 8–23)
CO2: 26 mmol/L (ref 22–32)
Calcium: 9.3 mg/dL (ref 8.9–10.3)
Chloride: 102 mmol/L (ref 98–111)
Creatinine, Ser: 0.96 mg/dL (ref 0.44–1.00)
GFR, Estimated: >60 mL/min (ref >60)
Glucose, Bld: 94 mg/dL (ref 70–99)
Potassium: 4.4 mmol/L (ref 3.5–5.1)
Sodium: 138 mmol/L (ref 135–145)
Total Bilirubin: 1.1 mg/dL (ref 0.3–1.2)
Total Protein: 6.9 g/dL (ref 6.5–8.1)

## 2020-12-14 LAB — URINALYSIS, COMPLETE (UACMP) WITH MICROSCOPIC
Bacteria, UA: NONE SEEN
Bilirubin Urine: NEGATIVE
Glucose, UA: NEGATIVE mg/dL
Hgb urine dipstick: NEGATIVE
Ketones, ur: NEGATIVE mg/dL
Nitrite: POSITIVE — AB
Protein, ur: NEGATIVE mg/dL
Specific Gravity, Urine: 1.032 — ABNORMAL HIGH (ref 1.005–1.030)
Squamous Epithelial / HPF: NONE SEEN (ref 0–5)
pH: 6 (ref 5.0–8.0)

## 2020-12-14 LAB — LIPASE, BLOOD: Lipase: 39 U/L (ref 11–51)

## 2020-12-14 MED ORDER — SODIUM CHLORIDE 0.9 % IV SOLN: 1.0000 g | Freq: Once | INTRAVENOUS | Status: DC

## 2020-12-14 MED ORDER — IOHEXOL 300 MG/ML  SOLN
100.0000 mL | Freq: Once | INTRAMUSCULAR | Status: AC | PRN
  Administered 2020-12-14: 100 mL via INTRAVENOUS

## 2020-12-14 MED ORDER — MORPHINE SULFATE (PF) 4 MG/ML IV SOLN
6.0000 mg | Freq: Once | INTRAVENOUS | Status: AC
  Administered 2020-12-14: 6 mg via INTRAVENOUS
  Filled 2020-12-14: qty 2

## 2020-12-14 NOTE — ED Provider Notes (Signed)
  Physical Exam  BP (!) 111/55   Pulse 96   Temp 98.4 F (36.9 C) (Oral)   Resp 19   Ht 5\' 4"  (1.626 m)   Wt 83.5 kg   SpO2 98%   BMI 31.58 kg/m   Physical Exam  ED Course/Procedures   Clinical Course as of 12/14/20 2353  Wed Dec 14, 2020  2315 Reassessed.  Patient little sleepy after morphine.  Continues to have tenderness on exam.  Daughter now at the bedside.  I discussed with him the CT results and we discussed likely metastatic cancer of some unknown etiology.  We discussed the possibility of observation admission versus outpatient management depending on her pain control, urinalysis.  Answered questions. [DS]  2325 Patient signed out to oncoming provider pending urinalysis and clinical improvement/pain control. [DS]    Clinical Course User Index [DS] Vladimir Crofts, MD    Procedures  MDM  11:50 PM  Assumed care.  Patient here with abdominal pain.  CT scan shows hepatic metastasis, possible pyelonephritis.  Previous history of breast cancer in remission.  Unknown primary source.  Urine pending.  Plan is to obtain urinalysis for further evaluation of pyelonephritis and continued pain control.  Patient may require admission.  12:00 AM  Pt's urine is nitrite positive with trace leukocytes, 11-20 white blood cells but no bacteria.  Suspect that she does have pyelonephritis based on this urinalysis.  We will add on a urine culture.  Will give Rocephin.  Given her age, comorbidities, will admit for treatment of pyelonephritis and further work-up for her hepatic metastatic disease.  She states her pain is a 0/10 as long as she is not moving.  Morphine seems to have worked well for her.  Patient's daughter is very reassured that she will be admitted to the hospital and did not feel comfortable taking her home given she was in so much pain when she arrived.  12:13 AM Discussed patient's case with hospitalist, Dr. Damita Dunnings.  I have recommended admission and patient (and family if present)  agree with this plan. Admitting physician will place admission orders.   I reviewed all nursing notes, vitals, pertinent previous records and reviewed/interpreted all EKGs, lab and urine results, imaging (as available).      Duell Holdren, Delice Bison, DO 12/15/20 [phone]

## 2020-12-14 NOTE — ED Notes (Signed)
Pt placed on 2L O2 via [place] at this time for comfort. MD Tamala Julian made aware at this time

## 2020-12-14 NOTE — ED Notes (Addendum)
Pt noted to be SOB at this time, speaking in short sentences. When asked it pt feels SOB pt stated that this is not new for her. Pt denied O2 use at home. MD Tamala Julian made aware at this time. MD Tamala Julian gave this RN verbal order for 1 view chest x-ray at this time. Order placed, MD at bedside.

## 2020-12-14 NOTE — ED Notes (Signed)
Pt placed on purewick to collect urine sample.

## 2020-12-14 NOTE — ED Provider Notes (Signed)
[facility] Emergency Department Provider Note ____________________________________________   Event Date/Time   First MD Initiated Contact with Patient 12/14/20 2159     (approximate)  I have reviewed the triage vital signs and the nursing notes.  HISTORY  Chief Complaint Abdominal Pain   HPI Tammy Hatfield is a 75 y.o. femalewho presents to the ED for evaluation of abdominal pain.  Chart review indicates history of DM on insulin, GERD, CAD, depression.  Patient is on Eliquis due to history of DVT.  Status post cholecystectomy.  COPD and CHF.   Remote history of breast cancer s/p lumpectomy.  She is s/p hysterectomy.  Her daughter reports that she may have had cervical cancer, precipitating the hysterectomy, but patient is uncertain of this.  Patient presents to the ED from home for evaluation of abdominal pain.  Chills at home with her son and is ambulatory independently without assistance device.   She presents to the ED with intermittent and global abdominal pain throughout the day today.  She reports she felt normal yesterday and this morning when she woke up, but she reports developing abdominal pain throughout the day today up to 10/10 intensity.  She reports the pain is scattered throughout her abdomen, occasionally to her RLQ, LLQ, and now more focally centered to her epigastrium and RUQ.  She denies any symptoms beyond the pain.  Denies fever, chest pain, cough or shortness of breath, denies emesis, nausea, diarrhea or stool changes.  Denies dysuria, hematuria, vaginal discharge or bleeding.  She reports last bowel movement was yesterday and constipated.  Past Medical History:  Diagnosis Date  . Anginal pain ([place])   . Anxiety   . Arthritis    knees  . Barrett's esophagus   . Bleeds easily ([place])   . Breast cancer ([place]) 2014   RT LUMPECTOMY  . Bruises easily   . Cancer [facility]) 2014   right breast - s\p Radiation and lumpectomy and LN dissection   . Chronic sinusitis   . Complication of anesthesia    hard to wake after Fentanyl  . COPD (chronic obstructive pulmonary disease) ([place]) 2012  . Coronary artery disease    4 STENTS  . Degenerative lumbar disc   . Diabetes mellitus without complication ([place])   . Dysphagia   . Dyspnea    WITH EXERTION  . Esophageal reflux   . Fatty liver   . History of Helicobacter pylori infection   . History of hiatal hernia   . Hypercholesteremia   . Hyperlipidemia   . Hypertension   . Personal history of radiation therapy   . Pneumonia   . Radiation 2014   RT BREAST CA  . Sleep apnea    NO CPAP-HAD A UPPP  . Ulcer, stomach peptic    history of  . Vertigo    last episode 2017(approx)    Patient Active Problem List   Diagnosis Date Noted  . DVT (deep venous thrombosis) ([place]) 09/14/2020  . Chronic venous insufficiency 09/14/2020  . Status post total knee replacement using cement, left 07/21/2020  . Benign essential hypertension 01/28/2020  . Breast cancer ([place]) 01/28/2020  . Lumbar disc disease 01/28/2020  . Pure hypercholesterolemia 01/28/2020  . Statin intolerance 11/25/2019  . SOB (shortness of breath) on exertion 09/17/2019  . Unstable angina ([place]) 09/16/2018  . Long-term insulin use ([place]) 07/08/2018  . Uncontrolled type 2 diabetes mellitus with hyperglycemia ([place]) 07/08/2018  . Obesity (BMI 35.0-39.9 without comorbidity) 06/12/2018  . Fissure,  anal 08/23/2017  . History of Barrett's esophagus 08/23/2017  . Other constipation 08/23/2017  . Coronary artery disease of native artery of native heart with stable angina pectoris ([place]) 07/10/2017  . Coronary artery disease involving native coronary artery of native heart without angina pectoris 07/10/2017  . Chest pain 07/05/2017  . S/P drug eluting coronary stent placement 07/05/2017  . Primary osteoarthritis of right knee 04/15/2017  . Primary cancer of upper inner quadrant of right female breast ([place]) 12/31/2016  . Sinus  congestion 11/13/2016  . Diverticulosis of large intestine without hemorrhage 07/03/2016  . Lateral epicondylitis of right elbow 11/10/2015  . Left knee pain 10/20/2015  . Primary osteoarthritis of left knee 04/28/2015  . Status post rotator cuff repair 03/10/2015  . CHF (congestive heart failure) ([place]) 02/11/2015  . OSA on CPAP 02/11/2015  . COPD (chronic obstructive pulmonary disease) ([place]) 02/11/2015  . Former smoker 02/11/2015  . HTN (hypertension) 02/11/2015  . Diabetes ([place]) 02/11/2015  . Rotator cuff rupture 02/11/2015  . Hyperlipidemia 02/11/2015  . GERD (gastroesophageal reflux disease) 02/11/2015  . PUD (peptic ulcer disease) 02/11/2015  . Dysphagia 02/11/2015  . ARF (acute renal failure) ([place]) 02/11/2015  . Impingement syndrome, shoulder, right 07/27/2014    Past Surgical History:  Procedure Laterality Date  . ABDOMINAL HYSTERECTOMY    . BREAST BIOPSY Right 1985   NEG  . BREAST EXCISIONAL BIOPSY Right 2014   [facility] and DCIS  . BREAST LUMPECTOMY Right 2014  . BUNIONECTOMY Bilateral 1980  . CATARACT EXTRACTION W/PHACO Left 04/01/2018   Procedure: CATARACT EXTRACTION PHACO AND INTRAOCULAR LENS PLACEMENT ([place]) LEFT DIABETES IVA TOPICAL;  Surgeon: [name], MD;  Location: [place];  Service: Ophthalmology;  Laterality: Left;  Diabetic - insulin and oral meds sleep apnea  . CATARACT EXTRACTION W/PHACO Right 04/30/2018   Procedure: CATARACT EXTRACTION PHACO AND INTRAOCULAR LENS PLACEMENT ([place]) RIGHT DIABETIC;  Surgeon: [name], MD;  Location: [place];  Service: Ophthalmology;  Laterality: Right;  Diabetic - insulin sleep apnea  . CHOLECYSTECTOMY    . COLONOSCOPY N/A 07/12/2020   Procedure: COLONOSCOPY;  Surgeon: [name], MD;  Location: [facility] ENDOSCOPY;  Service: Endoscopy;  Laterality: N/A;  . CORONARY ANGIOPLASTY    . CORONARY STENT INTERVENTION N/A 07/05/2017   Procedure: CORONARY STENT INTERVENTION;  Surgeon:  [name], MD;  Location: [place] CV LAB;  Service: Cardiovascular;  Laterality: N/A;  . ESOPHAGOGASTRODUODENOSCOPY N/A 07/12/2020   Procedure: ESOPHAGOGASTRODUODENOSCOPY (EGD);  Surgeon: [name], MD;  Location: [facility] ENDOSCOPY;  Service: Endoscopy;  Laterality: N/A;  . LAPAROSCOPIC TUBAL LIGATION Bilateral 1967  . LEFT HEART CATH AND CORONARY ANGIOGRAPHY N/A 07/05/2017   Procedure: LEFT HEART CATH AND CORONARY ANGIOGRAPHY;  Surgeon: [name], MD;  Location: [place] CV LAB;  Service: Cardiovascular;  Laterality: N/A;  . LEFT HEART CATH AND CORONARY ANGIOGRAPHY Left 09/18/2018   Procedure: Left Heart Cath w/ Coronary Angiography;  Surgeon: [name], MD;  Location: [place] CV LAB;  Service: Cardiovascular;  Laterality: Left;  . LEFT HEART CATH AND CORONARY ANGIOGRAPHY Left 02/25/2019   Procedure: LEFT HEART CATH AND CORONARY ANGIOGRAPHY;  Surgeon: Isaias Cowman, MD;  Location: [place] CV LAB;  Service: Cardiovascular;  Laterality: Left;  . SHOULDER ARTHROSCOPY WITH ROTATOR CUFF REPAIR Right 02/09/2015   Procedure: SHOULDER ARTHROSCOPY WITH ROTATOR CUFF REPAIR,release long head biceps tendon,subacromial decompression.;  Surgeon: Leanor Kail, MD;  Location: [facility] ORS;  Service: Orthopedics;  Laterality: Right;  . TOTAL KNEE  ARTHROPLASTY Left 07/21/2020   Procedure: TOTAL KNEE ARTHROPLASTY;  Surgeon: Corky Mull, MD;  Location: [facility] ORS;  Service: Orthopedics;  Laterality: Left;  . UMBILICAL HERNIA REPAIR N/A   . UVULOPALATOPHARYNGOPLASTY      Prior to Admission medications   Medication Sig Start Date End Date Taking? Authorizing Provider  aspirin EC 81 MG tablet Take 81 mg by mouth daily. Patient not taking: Reported on 09/15/2020    [provider]  calcium carbonate (TUMS - DOSED IN MG ELEMENTAL CALCIUM) 500 MG chewable tablet Chew 1,500 mg by mouth daily as needed for indigestion or heartburn.    [provider]   Carboxymethylcellul-Glycerin (LUBRICATING EYE DROPS OP) Place 1 drop into both eyes daily as needed (dry eyes). Patient not taking: Reported on 09/15/2020    [provider]  Cholecalciferol (VITAMIN D3) 25 MCG (1000 UT) CAPS Take 1,000 Units by mouth daily.  Patient not taking: Reported on 09/15/2020    [provider]  cyanocobalamin 1000 MCG tablet Take 1,000 mcg by mouth daily.  Patient not taking: Reported on 09/15/2020    [provider]  ELIQUIS 5 MG TABS tablet Take 5 mg by mouth 2 (two) times daily. 09/13/20   [provider]  fexofenadine (ALLEGRA) 180 MG tablet Take 180 mg by mouth daily as needed for allergies.  Patient not taking: Reported on 09/15/2020    [provider]  ibuprofen (ADVIL,MOTRIN) 200 MG tablet Take 400 mg by mouth daily as needed for headache or moderate pain.  Patient not taking: Reported on 09/15/2020    [provider]  insulin aspart (NOVOLOG) 100 UNIT/ML injection Inject 3 Units into the skin 3 (three) times daily with meals. Patient not taking: Reported on 09/15/2020 07/26/20   Lattie Corns, PA-C  insulin degludec (TRESIBA FLEXTOUCH) 200 UNIT/ML FlexTouch Pen Inject 66 Units into the skin at bedtime.  01/26/20   [provider]  isosorbide mononitrate (IMDUR) 30 MG 24 hr tablet Take 30 mg by mouth every morning.  12/22/19 12/21/20  [provider]  losartan (COZAAR) 50 MG tablet Take 50 mg by mouth every morning.     [provider]  metoprolol succinate (TOPROL-XL) 50 MG 24 hr tablet Take 50 mg by mouth every morning. Take with or immediately following a meal.    [provider]  nitroGLYCERIN (NITROSTAT) 0.4 MG SL tablet Place 1 tablet (0.4 mg total) under the tongue every 5 (five) minutes as needed for chest pain. 07/06/17   Bettey Costa, MD  ondansetron (ZOFRAN ODT) 4 MG disintegrating tablet Take 1 tablet (4 mg total) by mouth every 8 (eight) hours as needed for nausea or  vomiting. Patient not taking: Reported on 09/15/2020 08/19/20   Carrie Mew, MD  ondansetron (ZOFRAN) 4 MG tablet Take 1 tablet (4 mg total) by mouth every 6 (six) hours as needed for nausea. Patient not taking: Reported on 09/15/2020 07/25/20   Lattie Corns, PA-C  [facility] VERIO test strip 1 each 3 (three) times daily. 12/22/19   [provider]  oxyCODONE (OXY IR/ROXICODONE) 5 MG immediate release tablet Take 1-2 tablets (5-10 mg total) by mouth every 4 (four) hours as needed for moderate pain. Patient not taking: Reported on 09/15/2020 07/25/20   Lattie Corns, PA-C  pantoprazole (PROTONIX) 20 MG tablet Take 20 mg by mouth every morning.     [provider]  PARoxetine (PAXIL) 20 MG tablet Take 20 mg by mouth every morning.  [provider]  pregabalin (LYRICA) 75 MG capsule Take 1 capsule (75 mg total) by mouth 2 (two) times daily. Patient not taking: Reported on 09/15/2020 07/25/20   Lattie Corns, PA-C  Semaglutide,0.25 or 0.5MG /DOS, 2 MG/1.5ML SOPN Inject 1 mg as directed every Sunday.  01/26/20   [provider]  traMADol (ULTRAM) 50 MG tablet Take 1 tablet (50 mg total) by mouth every 6 (six) hours as needed for moderate pain. 07/25/20   Lattie Corns, PA-C  valACYclovir (VALTREX) 500 MG tablet Take 500 mg by mouth 2 (two) times daily as needed (fever blisters).  02/22/17   [provider]    Allergies Avelox [moxifloxacin], Oxycodone, Donepezil, Amoxicillin, Benazepril, Bupropion, [place] 10 mcg pen [exenatide], Isosorbide, Metformin, Neurontin [gabapentin], Pantoprazole, Sulfa antibiotics, and Ceclor [cefaclor]  Family History  Problem Relation Age of Onset  . Breast cancer Neg Hx     Social History Social History   Tobacco Use  . Smoking status: Former Smoker    Packs/day: 2.00    Years: 30.00    Pack years: 60.00    Quit date: 02/01/2000    Years since quitting: 20.8  . Smokeless tobacco: Never Used  .  Tobacco comment: 07/18/2017 former smoker quit 1999  Vaping Use  . Vaping Use: Never used  Substance Use Topics  . Alcohol use: No    Alcohol/week: 0.0 standard drinks    Comment: may have drink on Holidays  . Drug use: No    Review of Systems  Constitutional: No fever/chills Eyes: No visual changes. ENT: No sore throat. Cardiovascular: Denies chest pain. Respiratory: Denies shortness of breath. Gastrointestinal:   No nausea, no vomiting.  No diarrhea.  Positive for abdominal pain and constipation. Genitourinary: Negative for dysuria. Musculoskeletal: Negative for back pain. Skin: Negative for rash. Neurological: Negative for headaches, focal weakness or numbness.  ____________________________________________   PHYSICAL EXAM:  VITAL SIGNS: Vitals:   12/14/20 2200 12/14/20 2300  BP: (!) 138/48 (!) 111/55  Pulse: 84 96  Resp: (!) 26 19  Temp:    SpO2: 91% 98%     Constitutional: Alert and oriented. Well appearing and in no acute distress. Eyes: Conjunctivae are normal. PERRL. EOMI. Head: Atraumatic. Nose: No congestion/rhinnorhea. Mouth/Throat: Mucous membranes are moist.  Oropharynx non-erythematous. Neck: No stridor. No cervical spine tenderness to palpation. Cardiovascular: Tachycardic rate, regular rhythm. Grossly normal heart sounds.  Good peripheral circulation. Respiratory: Normal respiratory effort.  No retractions. Lungs CTAB. Gastrointestinal: Soft , nondistended. No CVA tenderness. Diffuse tenderness with voluntary guarding, primarily to the RUQ and epigastrium.  But also tender to the lower quadrants with guarding. Musculoskeletal: No lower extremity tenderness nor edema.  No joint effusions. No signs of acute trauma. Neurologic:  Normal speech and language. No gross focal neurologic deficits are appreciated. No gait instability noted. Skin:  Skin is warm, dry and intact. No rash noted. Psychiatric: Mood and affect are normal. Speech and behavior are  normal.  ____________________________________________   LABS (all labs ordered are listed, but only abnormal results are displayed)  Labs Reviewed  COMPREHENSIVE METABOLIC PANEL - Abnormal; Notable for the following components:      Result Value   AST 84 (*)    ALT 70 (*)    Alkaline Phosphatase 344 (*)    All other components within normal limits  SARS CORONAVIRUS 2 (TAT 6-24 HRS)  LIPASE, BLOOD  CBC  URINALYSIS, COMPLETE (UACMP) WITH MICROSCOPIC   ____________________________________________  12 Lead EKG  Sinus rhythm, rate  of 99 bpm.  Normal axis and intervals.  No evidence of acute ischemia. ____________________________________________  RADIOLOGY  ED MD interpretation: CT abdomen/pelvis reviewed by me with innumerable hepatic lesions concerning for metastatic disease  Official radiology report(s): CT ABDOMEN PELVIS W CONTRAST  Result Date: 12/14/2020 CLINICAL DATA:  75 year old female with abdominal pain. EXAM: CT ABDOMEN AND PELVIS WITH CONTRAST TECHNIQUE: Multidetector CT imaging of the abdomen and pelvis was performed using the standard protocol following bolus administration of intravenous contrast. CONTRAST:  122mL OMNIPAQUE IOHEXOL 300 MG/ML  SOLN COMPARISON:  CT abdomen pelvis dated 08/16/2020. FINDINGS: Lower chest: The visualized lung bases are clear. Coronary vascular calcifications noted. No intra-abdominal free air or free fluid. Hepatobiliary: Innumerable hypoenhancing lesions in the liver measuring up to 5 cm, new since the prior CT consistent with metastatic disease. There is irregularity and nodularity of the liver contour which may represent cirrhosis or pseudo cirrhosis. No intrahepatic biliary ductal dilatation. Cholecystectomy. No retained calcified stone noted in the central CBD. Pancreas: Unremarkable. No pancreatic ductal dilatation or surrounding inflammatory changes. Spleen: Normal in size without focal abnormality. Adrenals/Urinary Tract: The adrenal  glands unremarkable. There is no hydronephrosis on either side. There is apparent faint heterogeneous nephrogram in the inferior pole of the right kidney. Correlation with urinalysis recommended to exclude pyelonephritis. No drainable fluid collection or abscess. The visualized ureters and urinary bladder appear unremarkable. Stomach/Bowel: There is sigmoid diverticulosis and scattered colonic diverticula without active inflammatory changes. There is no bowel obstruction or active inflammation. The appendix is normal. Vascular/Lymphatic: Advanced aortoiliac atherosclerotic disease. The IVC is unremarkable. No portal venous gas. There is no adenopathy. Reproductive: Hysterectomy.  No adnexal masses. Other: None Musculoskeletal: Degenerative changes of the spine. No acute osseous pathology. IMPRESSION: 1. Innumerable hepatic metastatic disease, new since the prior CT. 2. Colonic diverticulosis. No bowel obstruction. Normal appendix. 3. Faint heterogeneous nephrogram in the inferior pole of the right kidney. Correlation with urinalysis recommended to exclude pyelonephritis. No drainable fluid collection or abscess. 4. Aortic Atherosclerosis (ICD10-I70.0). Electronically Signed   By: Anner Crete M.D.   On: 12/14/2020 22:57   DG Chest Portable 1 View  Result Date: 12/14/2020 CLINICAL DATA:  Shortness of breath EXAM: PORTABLE CHEST 1 VIEW COMPARISON:  None. FINDINGS: The heart size and mediastinal contours are within normal limits. Aortic knob calcifications are seen. Both lungs are clear. The visualized skeletal structures are unremarkable. IMPRESSION: No active disease. Electronically Signed   By: Prudencio Pair M.D.   On: 12/14/2020 22:25    ____________________________________________   PROCEDURES and INTERVENTIONS  Procedure(s) performed (including Critical Care):  .1-3 Lead EKG Interpretation Performed by: Vladimir Crofts, MD Authorized by: Vladimir Crofts, MD     Interpretation: normal     ECG rate:   92   ECG rate assessment: normal     Rhythm: sinus rhythm     Ectopy: none     Conduction: normal      Medications  morphine 4 MG/ML injection 6 mg (6 mg Intravenous Given 12/14/20 2223)  iohexol (OMNIPAQUE) 300 MG/ML solution 100 mL (100 mLs Intravenous Contrast Given 12/14/20 2238)    ____________________________________________   MDM / ED COURSE   75 year old woman with remote history of breast cancer since to the ED with 1 day of acute abdominal pain, found to have evidence of metastatic disease in her liver of uncertain etiology.  She presents tachycardic, likely due to her pain, resolving after analgesia and vitals otherwise normal on room air.  Exam with significant abdominal  tenderness, primarily to the RUQ, but is poorly localizing.  Blood work with mild transaminitis, otherwise unremarkable.  Urine pending at the time of signout.  Due to her diffuse pain, CT imaging of abdomen/pelvis obtained, and demonstrates evidence of metastatic disease accumulating in her liver.  No evidence of primary lesion within the study.  CXR demonstrates no infiltrates or disease.  I educate patient and daughter of the likelihood of cancer causing her pain, and we discussed an uncertain etiology of this.  Patient signed out to oncoming provider to follow-up on urinalysis and pain control.  If urinalysis unremarkable and her pain is controlled, then she may be reasonable for outpatient management after p.o. trial with oncology referral.  Considering CT results, if urinalysis demonstrates infectious features, I think she would benefit from admission the possibility of pyelonephritis, and for diagnostic work-up.   Clinical Course as of 12/14/20 2328  Wed Dec 14, 2020  2315 Reassessed.  Patient little sleepy after morphine.  Continues to have tenderness on exam.  Daughter now at the bedside.  I discussed with him the CT results and we discussed likely metastatic cancer of some unknown etiology.  We discussed the  possibility of observation admission versus outpatient management depending on her pain control, urinalysis.  Answered questions. [DS]  2325 Patient signed out to oncoming provider pending urinalysis and clinical improvement/pain control. [DS]    Clinical Course User Index [DS] Vladimir Crofts, MD    ____________________________________________   FINAL CLINICAL IMPRESSION(S) / ED DIAGNOSES  Final diagnoses:  Generalized abdominal pain     ED Discharge Orders    None       Lodema Parma Tamala Julian   Note:  This document was prepared using Dragon voice recognition software and may include unintentional dictation errors.   Vladimir Crofts, MD 12/14/20 2329

## 2020-12-14 NOTE — ED Triage Notes (Signed)
Pt to ED from home c/o epigastric pain like strong muscle spasms that started this afternoon.  Denies n/v/d or cough.  States taking deep breath makes it hurt worse, hx of lewybody dementia.  Seen by Dr. Josefa Half.  Pt also take Eliquis.

## 2020-12-14 NOTE — ED Notes (Signed)
Pt to CT at this time.

## 2020-12-15 ENCOUNTER — Observation Stay: Payer: Medicare HMO

## 2020-12-15 DIAGNOSIS — N10 Acute tubulo-interstitial nephritis: Secondary | ICD-10-CM | POA: Diagnosis not present

## 2020-12-15 DIAGNOSIS — C787 Secondary malignant neoplasm of liver and intrahepatic bile duct: Secondary | ICD-10-CM

## 2020-12-15 DIAGNOSIS — R109 Unspecified abdominal pain: Secondary | ICD-10-CM

## 2020-12-15 DIAGNOSIS — Z7901 Long term (current) use of anticoagulants: Secondary | ICD-10-CM

## 2020-12-15 DIAGNOSIS — N390 Urinary tract infection, site not specified: Secondary | ICD-10-CM | POA: Insufficient documentation

## 2020-12-15 DIAGNOSIS — Z853 Personal history of malignant neoplasm of breast: Secondary | ICD-10-CM

## 2020-12-15 LAB — HEMOGLOBIN A1C
Hgb A1c MFr Bld: 6.6 % — ABNORMAL HIGH (ref 4.8–5.6)
Mean Plasma Glucose: 142.72 mg/dL

## 2020-12-15 LAB — GLUCOSE, CAPILLARY
Glucose-Capillary: 60 mg/dL — ABNORMAL LOW (ref 70–99)
Glucose-Capillary: 74 mg/dL (ref 70–99)
Glucose-Capillary: 102 mg/dL — ABNORMAL HIGH (ref 70–99)
Glucose-Capillary: 121 mg/dL — ABNORMAL HIGH (ref 70–99)
Glucose-Capillary: 231 mg/dL — ABNORMAL HIGH (ref 70–99)

## 2020-12-15 LAB — CBC
HCT: 37.4 % (ref 36.0–46.0)
Hemoglobin: 12.2 g/dL (ref 12.0–15.0)
MCH: 30 pg (ref 26.0–34.0)
MCHC: 32.6 g/dL (ref 30.0–36.0)
MCV: 92.1 fL (ref 80.0–100.0)
Platelets: 175 10*3/uL (ref 150–400)
RBC: 4.06 MIL/uL (ref 3.87–5.11)
RDW: 14.2 % (ref 11.5–15.5)
WBC: 6.4 10*3/uL (ref 4.0–10.5)
nRBC: 0 % (ref 0.0–0.2)

## 2020-12-15 LAB — BASIC METABOLIC PANEL
Anion gap: 12 (ref 5–15)
BUN: 18 mg/dL (ref 8–23)
CO2: 27 mmol/L (ref 22–32)
Calcium: 9 mg/dL (ref 8.9–10.3)
Chloride: 99 mmol/L (ref 98–111)
Creatinine, Ser: 1.13 mg/dL — ABNORMAL HIGH (ref 0.44–1.00)
GFR, Estimated: 51 mL/min — ABNORMAL LOW (ref >60)
Glucose, Bld: 91 mg/dL (ref 70–99)
Potassium: 4 mmol/L (ref 3.5–5.1)
Sodium: 138 mmol/L (ref 135–145)

## 2020-12-15 LAB — SARS CORONAVIRUS 2 (TAT 6-24 HRS): SARS Coronavirus 2: NEGATIVE

## 2020-12-15 MED ORDER — ACETAMINOPHEN 650 MG RE SUPP
650.0000 mg | Freq: Four times a day (QID) | RECTAL | Status: DC | PRN
Start: 2020-12-15 — End: 2020-12-17

## 2020-12-15 MED ORDER — PAROXETINE HCL 20 MG PO TABS
20.0000 mg | ORAL_TABLET | Freq: Every day | ORAL | Status: DC
  Administered 2020-12-15 – 2020-12-17 (×3): 20 mg via ORAL
  Filled 2020-12-15 (×3): qty 1

## 2020-12-15 MED ORDER — INSULIN ASPART 100 UNIT/ML ~~LOC~~ SOLN
0.0000 [IU] | Freq: Three times a day (TID) | SUBCUTANEOUS | Status: DC
  Administered 2020-12-16 – 2020-12-17 (×3): 3 [IU] via SUBCUTANEOUS
  Filled 2020-12-15 (×3): qty 1

## 2020-12-15 MED ORDER — ONDANSETRON HCL 4 MG PO TABS
4.0000 mg | ORAL_TABLET | Freq: Four times a day (QID) | ORAL | Status: DC | PRN
Start: 2020-12-15 — End: 2020-12-17

## 2020-12-15 MED ORDER — INSULIN ASPART 100 UNIT/ML ~~LOC~~ SOLN
0.0000 [IU] | Freq: Every day | SUBCUTANEOUS | Status: DC
  Administered 2020-12-15: 2 [IU] via SUBCUTANEOUS
  Filled 2020-12-15: qty 1

## 2020-12-15 MED ORDER — APIXABAN 5 MG PO TABS
5.0000 mg | ORAL_TABLET | Freq: Two times a day (BID) | ORAL | Status: DC
  Administered 2020-12-15 – 2020-12-16 (×4): 5 mg via ORAL
  Filled 2020-12-15 (×4): qty 1

## 2020-12-15 MED ORDER — MORPHINE SULFATE (PF) 2 MG/ML IV SOLN: 2.0000 mg | INTRAVENOUS | Status: DC | PRN

## 2020-12-15 MED ORDER — ONDANSETRON HCL 4 MG/2ML IJ SOLN: 4.0000 mg | Freq: Four times a day (QID) | INTRAMUSCULAR | Status: DC | PRN

## 2020-12-15 MED ORDER — HYDROCODONE-ACETAMINOPHEN 5-325 MG PO TABS
1.0000 | ORAL_TABLET | ORAL | Status: DC | PRN
  Administered 2020-12-15 – 2020-12-16 (×4): 2 via ORAL
  Filled 2020-12-15 (×4): qty 2

## 2020-12-15 MED ORDER — METOPROLOL SUCCINATE ER 50 MG PO TB24
50.0000 mg | ORAL_TABLET | Freq: Every day | ORAL | Status: DC
  Administered 2020-12-15 – 2020-12-17 (×3): 50 mg via ORAL
  Filled 2020-12-15 (×3): qty 1

## 2020-12-15 MED ORDER — SODIUM CHLORIDE 0.9 % IV SOLN
1.0000 g | INTRAVENOUS | Status: DC
  Administered 2020-12-15 – 2020-12-17 (×2): 1 g via INTRAVENOUS
  Filled 2020-12-15: qty 1
  Filled 2020-12-15 (×3): qty 10

## 2020-12-15 MED ORDER — ACETAMINOPHEN 325 MG PO TABS
650.0000 mg | ORAL_TABLET | Freq: Four times a day (QID) | ORAL | Status: DC | PRN
  Administered 2020-12-17: 650 mg via ORAL
  Filled 2020-12-15: qty 2

## 2020-12-15 MED ORDER — NYSTATIN 100000 UNIT/GM EX POWD
Freq: Two times a day (BID) | CUTANEOUS | Status: DC
  Filled 2020-12-15: qty 15

## 2020-12-15 MED ORDER — LOSARTAN POTASSIUM 50 MG PO TABS
50.0000 mg | ORAL_TABLET | Freq: Every day | ORAL | Status: DC
  Administered 2020-12-15 – 2020-12-17 (×3): 50 mg via ORAL
  Filled 2020-12-15 (×3): qty 1

## 2020-12-15 NOTE — Progress Notes (Signed)
Patient arrived from [facility]

## 2020-12-15 NOTE — Progress Notes (Signed)
Patient states she will update son/family of room change.

## 2020-12-15 NOTE — Progress Notes (Addendum)
PROGRESS NOTE   HPI was taken from Dr. Damita Dunnings: Tammy Hatfield is a 75 y.o. female with medical history significant for HTN, insulin-dependent type 2 diabetes, depression, COPD history of breast cancer status post lumpectomy and XRT in 2015 on anastrozole, followed yearly by oncology, DVT on Eliquis, CAD, who presents to the ED for evaluation of generalized abdominal pain in the setting of prior hysterectomy and cholecystectomy.  Pain started on awakening on the day of arrival and is of 10 out of 10 intensity, generalized but mostly to the lower abdomen and epigastrium.  She has no associated nausea, vomiting or change in bowel habits denies dysuria.  Has no chest pain or shortness of breath, cough, fever or chills. ED course: On arrival: BP 138/48 with pulse 84, respirations 26 and O2 sat 98% on room air.  Blood work including CBC, CMP and lipase unremarkable except for elevated liver enzymes with AST/ALT of 84/70 and alk phos of 344.  Urinalysis with positive nitrites and trace leukocyte esterase. EKG, viewed and interpreted myself: NSR at 99 with no acute ST-T wave changes Imaging: Chest x-ray no active disease CT abdomen and pelvis: Innumerable hepatic metastatic disease new since prior CT December 2021.  Mild abnormality inferior pole of the right kidney that might suggest pyelonephritis.  Patient started on Rocephin.  Hospitalist consulted for admission patient was pain controlled by admission.   Tammy Hatfield  SNK:[id number] DOB: May 14, 1946 DOA: 12/14/2020 PCP: Baxter Hire, MD    Assessment & Plan:   Active Problems:   COPD (chronic obstructive pulmonary disease) (HCC)   HTN (hypertension)   Diabetes (HCC)   Chronic anticoagulation   Hepatic metastases (HCC)   Abdominal pain   History of breast cancer   Acute pyelonephritis   Acute pyelonephritis: positive UA and possible pyelonephritis seen on CT. Urine cx is pending.  Continue on IV rocephin   Hepatic metastases:  secondary to breast cancer. S/p lumpectomy and XRT in 2015, currently on anastrozole, followed yearly, last seen May 2021. Onco consulted (Dr. Grayland Ormond)   CAD: continue on home dose of metoprolol, losartan   Depression: severity unknown. Continue on home dose of paroxetine   DM2: likely poorly controlled. Continue on SSI w/ accuchecks   Hx of DVT: continue on eliquis   Transminitis: likely secondary to liver mets. Will continue to monitor      DVT prophylaxis: eliquis  Code Status: full  Family Communication:  Disposition Plan: likely d/c back home  Level of care: Med-Surg   Status is: Observation  The patient remains OBS appropriate and will d/c before 2 midnights.  Dispo: The patient is from: Home              Anticipated d/c is to: Home              Patient currently is not medically stable to d/c.   Difficult to place patient Yes    Consultants:   onco   Procedures:    Antimicrobials: ceftriaxone    Subjective: Pt c/o urinary urgency   Objective: Vitals:   12/15/20 0121 12/15/20 0505 12/15/20 0744 12/15/20 1147  BP: (!) 134/51 (!) 111/53 (!) 123/55 (!) 136/52  Pulse: 93 88 73 76  Resp: 18  18 16   Temp: 98.4 F (36.9 C) 98.1 F (36.7 C) 97.7 F (36.5 C) 97.8 F (36.6 C)  TempSrc: Oral Oral Oral Oral  SpO2: 96% 91% 93% 94%  Weight: 85.5 kg     Height: 5' 4"  (  1.626 m)       Intake/Output Summary (Last 24 hours) at 12/15/2020 1524 Last data filed at 12/15/2020 1007 Gross per 24 hour  Intake 780 ml  Output 0 ml  Net 780 ml   Filed Weights   12/14/20 1927 12/15/20 0121  Weight: 83.5 kg 85.5 kg    Examination:  General exam: Appears calm and comfortable  Respiratory system: Clear to auscultation. Respiratory effort normal. Cardiovascular system: S1 & S2 +. No rubs, gallops or clicks.  Gastrointestinal system: Abdomen is nondistended, soft and nontender. Normal bowel sounds heard. Central nervous system: Alert and oriented. Moves all 4  extremities  Psychiatry: Judgement and insight appear normal. Mood & affect appropriate.     Data Reviewed: I have personally reviewed following labs and imaging studies  CBC: Recent Labs  Lab 12/14/20 1931 12/15/20 0841  WBC 8.8 6.4  HGB 12.2 12.2  HCT 37.8 37.4  MCV 90.4 92.1  PLT 208 852   Basic Metabolic Panel: Recent Labs  Lab 12/14/20 1931 12/15/20 0841  NA 138 138  K 4.4 4.0  CL 102 99  CO2 26 27  GLUCOSE 94 91  BUN 15 18  CREATININE 0.96 1.13*  CALCIUM 9.3 9.0   GFR: Estimated Creatinine Clearance: 46.2 mL/min (A) (by C-G formula based on SCr of 1.13 mg/dL (H)). Liver Function Tests: Recent Labs  Lab 12/14/20 1931  AST 84*  ALT 70*  ALKPHOS 344*  BILITOT 1.1  PROT 6.9  ALBUMIN 3.8   Recent Labs  Lab 12/14/20 1931  LIPASE 39   No results for input(s): AMMONIA in the last 168 hours. Coagulation Profile: No results for input(s): INR, PROTIME in the last 168 hours. Cardiac Enzymes: No results for input(s): CKTOTAL, CKMB, CKMBINDEX, TROPONINI in the last 168 hours. BNP (last 3 results) No results for input(s): PROBNP in the last 8760 hours. HbA1C: Recent Labs    12/15/20 0521  HGBA1C 6.6*   CBG: Recent Labs  Lab 12/15/20 0745 12/15/20 0819 12/15/20 1148  GLUCAP 60* 74 102*   Lipid Profile: No results for input(s): CHOL, HDL, LDLCALC, TRIG, CHOLHDL, LDLDIRECT in the last 72 hours. Thyroid Function Tests: No results for input(s): TSH, T4TOTAL, FREET4, T3FREE, THYROIDAB in the last 72 hours. Anemia Panel: No results for input(s): VITAMINB12, FOLATE, FERRITIN, TIBC, IRON, RETICCTPCT in the last 72 hours. Sepsis Labs: No results for input(s): PROCALCITON, LATICACIDVEN in the last 168 hours.  Recent Results (from the past 240 hour(s))  SARS CORONAVIRUS 2 (TAT 6-24 HRS) Nasopharyngeal Nasopharyngeal Swab     Status: None   Collection Time: 12/14/20 11:01 PM   Specimen: Nasopharyngeal Swab  Result Value Ref Range Status   SARS  Coronavirus 2 NEGATIVE NEGATIVE Final    Comment: (NOTE) SARS-CoV-2 target nucleic acids are NOT DETECTED.  The SARS-CoV-2 RNA is generally detectable in upper and lower respiratory specimens during the acute phase of infection. Negative results do not preclude SARS-CoV-2 infection, do not rule out co-infections with other pathogens, and should not be used as the sole basis for treatment or other patient management decisions. Negative results must be combined with clinical observations, patient history, and epidemiological information. The expected result is Negative.  Fact Sheet for Patients: SugarRoll.be  Fact Sheet for Healthcare Providers: https://www.woods-mathews.com/  This test is not yet approved or cleared by the [place] FDA and  has been authorized for detection and/or diagnosis of SARS-CoV-2 by FDA under an Emergency Use Authorization (EUA). This EUA will remain  in effect (  meaning this test can be used) for the duration of the COVID-19 declaration under Se ction 564(b)(1) of the Act, 21 U.S.C. section 360bbb-3(b)(1), unless the authorization is terminated or revoked sooner.  Performed at [facility], [place] [address], [place], [place] [address]          Radiology Studies: CT ABDOMEN PELVIS W CONTRAST  Result Date: 12/14/2020 CLINICAL DATA:  75 year old female with abdominal pain. EXAM: CT ABDOMEN AND PELVIS WITH CONTRAST TECHNIQUE: Multidetector CT imaging of the abdomen and pelvis was performed using the standard protocol following bolus administration of intravenous contrast. CONTRAST:  141m OMNIPAQUE IOHEXOL 300 MG/ML  SOLN COMPARISON:  CT abdomen pelvis dated 08/16/2020. FINDINGS: Lower chest: The visualized lung bases are clear. Coronary vascular calcifications noted. No intra-abdominal free air or free fluid. Hepatobiliary: Innumerable hypoenhancing lesions in the liver measuring up to 5 cm, new since the  prior CT consistent with metastatic disease. There is irregularity and nodularity of the liver contour which may represent cirrhosis or pseudo cirrhosis. No intrahepatic biliary ductal dilatation. Cholecystectomy. No retained calcified stone noted in the central CBD. Pancreas: Unremarkable. No pancreatic ductal dilatation or surrounding inflammatory changes. Spleen: Normal in size without focal abnormality. Adrenals/Urinary Tract: The adrenal glands unremarkable. There is no hydronephrosis on either side. There is apparent faint heterogeneous nephrogram in the inferior pole of the right kidney. Correlation with urinalysis recommended to exclude pyelonephritis. No drainable fluid collection or abscess. The visualized ureters and urinary bladder appear unremarkable. Stomach/Bowel: There is sigmoid diverticulosis and scattered colonic diverticula without active inflammatory changes. There is no bowel obstruction or active inflammation. The appendix is normal. Vascular/Lymphatic: Advanced aortoiliac atherosclerotic disease. The IVC is unremarkable. No portal venous gas. There is no adenopathy. Reproductive: Hysterectomy.  No adnexal masses. Other: None Musculoskeletal: Degenerative changes of the spine. No acute osseous pathology. IMPRESSION: 1. Innumerable hepatic metastatic disease, new since the prior CT. 2. Colonic diverticulosis. No bowel obstruction. Normal appendix. 3. Faint heterogeneous nephrogram in the inferior pole of the right kidney. Correlation with urinalysis recommended to exclude pyelonephritis. No drainable fluid collection or abscess. 4. Aortic Atherosclerosis (ICD10-I70.0). Electronically Signed   By: AAnner CreteM.D.   On: 12/14/2020 22:57   DG Chest Portable 1 View  Result Date: 12/14/2020 CLINICAL DATA:  Shortness of breath EXAM: PORTABLE CHEST 1 VIEW COMPARISON:  None. FINDINGS: The heart size and mediastinal contours are within normal limits. Aortic knob calcifications are seen. Both  lungs are clear. The visualized skeletal structures are unremarkable. IMPRESSION: No active disease. Electronically Signed   By: BPrudencio PairM.D.   On: 12/14/2020 22:25        Scheduled Meds: . apixaban  5 mg Oral BID  . insulin aspart  0-15 Units Subcutaneous TID WC  . insulin aspart  0-5 Units Subcutaneous QHS  . losartan  50 mg Oral Daily  . metoprolol succinate  50 mg Oral Daily  . PARoxetine  20 mg Oral Daily   Continuous Infusions: . cefTRIAXone (ROCEPHIN)  IV Stopped (12/15/20 0138)     LOS: 0 days    Time spent:  33 mins    JWyvonnia Dusky MD [facility] Pager [phone]  If 7PM-7AM, please contact night-coverage 12/15/2020, 3:24 PM

## 2020-12-15 NOTE — Progress Notes (Signed)
Initial Nutrition Assessment  DOCUMENTATION CODES:   Obesity unspecified  INTERVENTION:   -MVI with minerals daily -Glucerna Shake po TID, each supplement provides 220 kcal and 10 grams of protein  NUTRITION DIAGNOSIS:   Increased nutrient needs related to acute illness (possible pyleonephritis and metastatic disease) as evidenced by estimated needs.  GOAL:   Patient will meet greater than or equal to 90% of their needs  MONITOR:   PO intake,Supplement acceptance,Labs,Weight trends,Skin,I & O's  REASON FOR ASSESSMENT:   Malnutrition Screening Tool    ASSESSMENT:   75 year old female with history of HTN, insulin-dependent type 2 diabetes, depression, breast cancer status post lumpectomy and XRT in 2015 on anastrozole, followed yearly by oncology, DVT on Eliquis, CAD, who presents to the ED for evaluation of generalized abdominal pain .  Pt admitted with abdominal pain.   Reviewed I/O's: +340 ml x 24 hours  Per MD notes, CT abdomen and pelvis showing innumerable hepatic metastatic disease and possible pyelonephritis.   Pt unavailable at time of visit. RD attempted to speak with pt via call to hospital room phone, however, unable to reach. RD unable to obtain further nutrition-related history or complete nutrition-focused physical exam at this time.   Pt with good meal intake. Noted PO 100%. Pt with hypoglycemic event this morning.   Reviewed wt hx; pt has experienced a 10.3% wt loss over the past 6 months, which is significant for time frame.    Lab Results  Component Value Date   HGBA1C 6.6 (H) 12/15/2020   PTA DM medications are 3 units insulin aspart TID with meals.   Labs reviewed: CBGS: 60-102 (inpatient orders for glycemic control are 0-5 units insulin aspart daily at bedtime and 0-15 units insulin aspart TID with meals).   Diet Order:   Diet Order            Diet heart healthy/carb modified Room service appropriate? Yes; Fluid consistency: Thin  Diet  effective now                 EDUCATION NEEDS:   No education needs have been identified at this time  Skin:  Skin Assessment: Reviewed RN Assessment  Last BM:  12/13/20  Height:   Ht Readings from Last 1 Encounters:  12/15/20 5\' 4"  (1.626 m)    Weight:   Wt Readings from Last 1 Encounters:  12/15/20 85.5 kg    Ideal Body Weight:  54.5 kg  BMI:  Body mass index is 32.37 kg/m.  Estimated Nutritional Needs:   Kcal:  2000-2200  Protein:  105-120 grams  Fluid:  > 2 L    Loistine Chance, RD, LDN, [place] Registered Dietitian II Certified Diabetes Care and Education Specialist Please refer to [facility] for RD and/or RD on-call/weekend/after hours pager

## 2020-12-15 NOTE — Progress Notes (Signed)
Hypoglycemic Event  CBG: 60  Treatment: 4 oz juice/soda  Symptoms: None  Follow-up CBG: [id number] CBG Result:74  Possible Reasons for Event: Unknown  Comments/MD notified:Dr. Winfred Leeds

## 2020-12-15 NOTE — H&P (Signed)
History and Physical    Tammy Hatfield YQM:[id number] DOB: Apr 21, 1946 DOA: 12/14/2020  PCP: Baxter Hire, MD   Patient coming from: Home  I have personally briefly reviewed patient's old medical records in [place]  Chief Complaint: Abdominal pain x1 day  HPI: Tammy Hatfield is a 75 y.o. female with medical history significant for HTN, insulin-dependent type 2 diabetes, depression, COPD history of breast cancer status post lumpectomy and XRT in 2015 on anastrozole, followed yearly by oncology, DVT on Eliquis, CAD, who presents to the ED for evaluation of generalized abdominal pain in the setting of prior hysterectomy and cholecystectomy.  Pain started on awakening on the day of arrival and is of 10 out of 10 intensity, generalized but mostly to the lower abdomen and epigastrium.  She has no associated nausea, vomiting or change in bowel habits denies dysuria.  Has no chest pain or shortness of breath, cough, fever or chills. ED course: On arrival: BP 138/48 with pulse 84, respirations 26 and O2 sat 98% on room air.  Blood work including CBC, CMP and lipase unremarkable except for elevated liver enzymes with AST/ALT of 84/70 and alk phos of 344.  Urinalysis with positive nitrites and trace leukocyte esterase. EKG, viewed and interpreted myself: NSR at 99 with no acute ST-T wave changes Imaging: Chest x-ray no active disease CT abdomen and pelvis: Innumerable hepatic metastatic disease new since prior CT December 2021.  Mild abnormality inferior pole of the right kidney that might suggest pyelonephritis.  Patient started on Rocephin.  Hospitalist consulted for admission patient was pain controlled by admission.  Review of Systems: As per HPI otherwise all other systems on review of systems negative.    Past Medical History:  Diagnosis Date  . Anginal pain ([place])   . Anxiety   . Arthritis    knees  . Barrett's esophagus   . Bleeds easily ([place])   . Breast cancer ([place])  2014   RT LUMPECTOMY  . Bruises easily   . Cancer [facility]) 2014   right breast - s\p Radiation and lumpectomy and LN dissection  . Chronic sinusitis   . Complication of anesthesia    hard to wake after Fentanyl  . COPD (chronic obstructive pulmonary disease) ([place]) 2012  . Coronary artery disease    4 STENTS  . Degenerative lumbar disc   . Diabetes mellitus without complication ([place])   . Dysphagia   . Dyspnea    WITH EXERTION  . Esophageal reflux   . Fatty liver   . History of Helicobacter pylori infection   . History of hiatal hernia   . Hypercholesteremia   . Hyperlipidemia   . Hypertension   . Personal history of radiation therapy   . Pneumonia   . Radiation 2014   RT BREAST CA  . Sleep apnea    NO CPAP-HAD A UPPP  . Ulcer, stomach peptic    history of  . Vertigo    last episode 2017(approx)    Past Surgical History:  Procedure Laterality Date  . ABDOMINAL HYSTERECTOMY    . BREAST BIOPSY Right 1985   NEG  . BREAST EXCISIONAL BIOPSY Right 2014   [facility] and DCIS  . BREAST LUMPECTOMY Right 2014  . BUNIONECTOMY Bilateral 1980  . CATARACT EXTRACTION W/PHACO Left 04/01/2018   Procedure: CATARACT EXTRACTION PHACO AND INTRAOCULAR LENS PLACEMENT ([place]) LEFT DIABETES IVA TOPICAL;  Surgeon: [name], MD;  Location: [facility];  Service: Ophthalmology;  Laterality: Left;  Diabetic - insulin and oral meds sleep apnea  . CATARACT EXTRACTION W/PHACO Right 04/30/2018   Procedure: CATARACT EXTRACTION PHACO AND INTRAOCULAR LENS PLACEMENT ([place]) RIGHT DIABETIC;  Surgeon: [name], MD;  Location: [facility];  Service: Ophthalmology;  Laterality: Right;  Diabetic - insulin sleep apnea  . CHOLECYSTECTOMY    . COLONOSCOPY N/A 07/12/2020   Procedure: COLONOSCOPY;  Surgeon: [name], MD;  Location: [facility] ENDOSCOPY;  Service: Endoscopy;  Laterality: N/A;  . CORONARY ANGIOPLASTY    . CORONARY STENT INTERVENTION N/A 07/05/2017   Procedure:  CORONARY STENT INTERVENTION;  Surgeon: [name], MD;  Location: [facility] CV LAB;  Service: Cardiovascular;  Laterality: N/A;  . ESOPHAGOGASTRODUODENOSCOPY N/A 07/12/2020   Procedure: ESOPHAGOGASTRODUODENOSCOPY (EGD);  Surgeon: [name], MD;  Location: [facility] ENDOSCOPY;  Service: Endoscopy;  Laterality: N/A;  . LAPAROSCOPIC TUBAL LIGATION Bilateral 1967  . LEFT HEART CATH AND CORONARY ANGIOGRAPHY N/A 07/05/2017   Procedure: LEFT HEART CATH AND CORONARY ANGIOGRAPHY;  Surgeon: [name], MD;  Location: [facility] CV LAB;  Service: Cardiovascular;  Laterality: N/A;  . LEFT HEART CATH AND CORONARY ANGIOGRAPHY Left 09/18/2018   Procedure: Left Heart Cath w/ Coronary Angiography;  Surgeon: [name], MD;  Location: [facility] CV LAB;  Service: Cardiovascular;  Laterality: Left;  . LEFT HEART CATH AND CORONARY ANGIOGRAPHY Left 02/25/2019   Procedure: LEFT HEART CATH AND CORONARY ANGIOGRAPHY;  Surgeon: Isaias Cowman, MD;  Location: [facility] CV LAB;  Service: Cardiovascular;  Laterality: Left;  . SHOULDER ARTHROSCOPY WITH ROTATOR CUFF REPAIR Right 02/09/2015   Procedure: SHOULDER ARTHROSCOPY WITH ROTATOR CUFF REPAIR,release long head biceps tendon,subacromial decompression.;  Surgeon: Leanor Kail, MD;  Location: [facility] ORS;  Service: Orthopedics;  Laterality: Right;  . TOTAL KNEE ARTHROPLASTY Left 07/21/2020   Procedure: TOTAL KNEE ARTHROPLASTY;  Surgeon: Corky Mull, MD;  Location: [facility] ORS;  Service: Orthopedics;  Laterality: Left;  . UMBILICAL HERNIA REPAIR N/A   . UVULOPALATOPHARYNGOPLASTY       reports that she quit smoking about 20 years ago. She has a 60.00 pack-year smoking history. She has never used smokeless tobacco. She reports that she does not drink alcohol and does not use drugs.  Allergies  Allergen Reactions  . Avelox [Moxifloxacin] Other (See Comments)    debilitating muscle pain  . Oxycodone     Other reaction(s): Hallucination  .  Donepezil Other (See Comments)    Patient has severe nightmares   . Amoxicillin Other (See Comments)    Yeast infection - tolerated cefazolin before DID THE REACTION INVOLVE: Swelling of the face/tongue/throat, SOB, or low BP? No Sudden or severe rash/hives, skin peeling, or the inside of the mouth or nose? No Did it require medical treatment? No When did it last happen?within the past 10 years If all above answers are "NO", may proceed with cephalosporin use.  . Benazepril Cough  . Bupropion     Dizziness, Headache  . Byetta 10 Mcg Pen [Exenatide] Diarrhea  . Isosorbide Swelling    Headaches, issues with balance  . Metformin Diarrhea  . Neurontin [Gabapentin] Other (See Comments)    Mouth blisters, joint pain, depression   . Pantoprazole      Abdominal Pain  . Sulfa Antibiotics Other (See Comments)    Headache  . Ceclor [Cefaclor] Rash    Tolerated cefazolin before.    Family History  Problem Relation Age of Onset  . Breast cancer Neg Hx       Prior  to Admission medications   Medication Sig Start Date End Date Taking? Authorizing Provider  aspirin EC 81 MG tablet Take 81 mg by mouth daily. Patient not taking: Reported on 09/15/2020    [provider]  calcium carbonate (TUMS - DOSED IN MG ELEMENTAL CALCIUM) 500 MG chewable tablet Chew 1,500 mg by mouth daily as needed for indigestion or heartburn.    [provider]  Carboxymethylcellul-Glycerin (LUBRICATING EYE DROPS OP) Place 1 drop into both eyes daily as needed (dry eyes). Patient not taking: Reported on 09/15/2020    [provider]  Cholecalciferol (VITAMIN D3) 25 MCG (1000 UT) CAPS Take 1,000 Units by mouth daily.  Patient not taking: Reported on 09/15/2020    [provider]  cyanocobalamin 1000 MCG tablet Take 1,000 mcg by mouth daily.  Patient not taking: Reported on 09/15/2020    [provider]  ELIQUIS 5 MG TABS tablet Take 5 mg by mouth 2 (two) times daily. 09/13/20    [provider]  fexofenadine (ALLEGRA) 180 MG tablet Take 180 mg by mouth daily as needed for allergies.  Patient not taking: Reported on 09/15/2020    [provider]  ibuprofen (ADVIL,MOTRIN) 200 MG tablet Take 400 mg by mouth daily as needed for headache or moderate pain.  Patient not taking: Reported on 09/15/2020    [provider]  insulin aspart (NOVOLOG) 100 UNIT/ML injection Inject 3 Units into the skin 3 (three) times daily with meals. Patient not taking: Reported on 09/15/2020 07/26/20   Lattie Corns, PA-C  insulin degludec (TRESIBA FLEXTOUCH) 200 UNIT/ML FlexTouch Pen Inject 66 Units into the skin at bedtime.  01/26/20   [provider]  isosorbide mononitrate (IMDUR) 30 MG 24 hr tablet Take 30 mg by mouth every morning.  12/22/19 12/21/20  [provider]  losartan (COZAAR) 50 MG tablet Take 50 mg by mouth every morning.     [provider]  metoprolol succinate (TOPROL-XL) 50 MG 24 hr tablet Take 50 mg by mouth every morning. Take with or immediately following a meal.    [provider]  nitroGLYCERIN (NITROSTAT) 0.4 MG SL tablet Place 1 tablet (0.4 mg total) under the tongue every 5 (five) minutes as needed for chest pain. 07/06/17   Bettey Costa, MD  ondansetron (ZOFRAN ODT) 4 MG disintegrating tablet Take 1 tablet (4 mg total) by mouth every 8 (eight) hours as needed for nausea or vomiting. Patient not taking: Reported on 09/15/2020 08/19/20   Carrie Mew, MD  ondansetron (ZOFRAN) 4 MG tablet Take 1 tablet (4 mg total) by mouth every 6 (six) hours as needed for nausea. Patient not taking: Reported on 09/15/2020 07/25/20   Lattie Corns, PA-C  [facility] VERIO test strip 1 each 3 (three) times daily. 12/22/19   [provider]  oxyCODONE (OXY IR/ROXICODONE) 5 MG immediate release tablet Take 1-2 tablets (5-10 mg total) by mouth every 4 (four) hours as needed for moderate pain. Patient not taking: Reported on  09/15/2020 07/25/20   Lattie Corns, PA-C  pantoprazole (PROTONIX) 20 MG tablet Take 20 mg by mouth every morning.     [provider]  PARoxetine (PAXIL) 20 MG tablet Take 20 mg by mouth every morning.     [provider]  pregabalin (LYRICA) 75 MG capsule Take 1 capsule (75 mg total) by mouth 2 (two) times daily. Patient not taking: Reported on 09/15/2020 07/25/20   Lattie Corns, PA-C  Semaglutide,0.25 or 0.5MG/DOS, 2 MG/1.5ML  SOPN Inject 1 mg as directed every Sunday.  01/26/20   [provider]  traMADol (ULTRAM) 50 MG tablet Take 1 tablet (50 mg total) by mouth every 6 (six) hours as needed for moderate pain. 07/25/20   Lattie Corns, PA-C  valACYclovir (VALTREX) 500 MG tablet Take 500 mg by mouth 2 (two) times daily as needed (fever blisters).  02/22/17   [provider]    Physical Exam: Vitals:   12/14/20 1927 12/14/20 2200 12/14/20 2300 12/15/20 0000  BP: (!) 143/66 (!) 138/48 (!) 111/55 126/61  Pulse: (!) 101 84 96 94  Resp: 18 (!) 26 19 (!) 22  Temp: 98.4 F (36.9 C)     TempSrc: Oral     SpO2: 94% 91% 98% 99%  Weight: 83.5 kg     Height: _0  (1.626 m)        Vitals:   12/14/20 1927 12/14/20 2200 12/14/20 2300 12/15/20 0000  BP: (!) 143/66 (!) 138/48 (!) 111/55 126/61  Pulse: (!) 101 84 96 94  Resp: 18 (!) 26 19 (!) 22  Temp: 98.4 F (36.9 C)     TempSrc: Oral     SpO2: 94% 91% 98% 99%  Weight: 83.5 kg     Height: _1  (1.626 m)         Constitutional: Alert and oriented x 3 . Not in any apparent distress HEENT:      Head: Normocephalic and atraumatic.         Eyes: PERLA, EOMI, Conjunctivae are normal. Sclera is non-icteric.       Mouth/Throat: Mucous membranes are moist.       Neck: Supple with no signs of meningismus. Cardiovascular: Regular rate and rhythm. No murmurs, gallops, or rubs. 2+ symmetrical distal pulses are present . No JVD. No LE edema Respiratory: Respiratory effort normal .Lungs sounds  clear bilaterally. No wheezes, crackles, or rhonchi.  Gastrointestinal: Soft, diffuse tenderness on palpation in epigastrium lower quadrants, and non distended with positive bowel sounds.  Genitourinary: No CVA tenderness. Musculoskeletal: Nontender with normal range of motion in all extremities. No cyanosis, or erythema of extremities. Neurologic:  Face is symmetric. Moving all extremities. No gross focal neurologic deficits . Skin: Skin is warm, dry.  No rash or ulcers Psychiatric: Mood and affect are normal    Labs on Admission: I have personally reviewed following labs and imaging studies  CBC: Recent Labs  Lab 12/14/20 1931  WBC 8.8  HGB 12.2  HCT 37.8  MCV 90.4  PLT 482   Basic Metabolic Panel: Recent Labs  Lab 12/14/20 1931  NA 138  K 4.4  CL 102  CO2 26  GLUCOSE 94  BUN 15  CREATININE 0.96  CALCIUM 9.3   GFR: Estimated Creatinine Clearance: 53.7 mL/min (by C-G formula based on SCr of 0.96 mg/dL). Liver Function Tests: Recent Labs  Lab 12/14/20 1931  AST 84*  ALT 70*  ALKPHOS 344*  BILITOT 1.1  PROT 6.9  ALBUMIN 3.8   Recent Labs  Lab 12/14/20 1931  LIPASE 39   No results for input(s): AMMONIA in the last 168 hours. Coagulation Profile: No results for input(s): INR, PROTIME in the last 168 hours. Cardiac Enzymes: No results for input(s): CKTOTAL, CKMB, CKMBINDEX, TROPONINI in the last 168 hours. BNP (last 3 results) No results for input(s): PROBNP in the last 8760 hours. HbA1C: No results for input(s): HGBA1C in the last 72 hours. CBG: No results for input(s): GLUCAP in the last 168  hours. Lipid Profile: No results for input(s): CHOL, HDL, LDLCALC, TRIG, CHOLHDL, LDLDIRECT in the last 72 hours. Thyroid Function Tests: No results for input(s): TSH, T4TOTAL, FREET4, T3FREE, THYROIDAB in the last 72 hours. Anemia Panel: No results for input(s): VITAMINB12, FOLATE, FERRITIN, TIBC, IRON, RETICCTPCT in the last 72 hours. Urine analysis:     Component Value Date/Time   COLORURINE YELLOW (A) 12/14/2020 2301   APPEARANCEUR CLEAR (A) 12/14/2020 2301   APPEARANCEUR Clear 09/09/2012 1640   LABSPEC 1.032 (H) 12/14/2020 2301   LABSPEC 1.009 09/09/2012 1640   PHURINE 6.0 12/14/2020 2301   GLUCOSEU NEGATIVE 12/14/2020 2301   GLUCOSEU Negative 09/09/2012 1640   HGBUR NEGATIVE 12/14/2020 2301   BILIRUBINUR NEGATIVE 12/14/2020 2301   BILIRUBINUR Negative 09/09/2012 1640   KETONESUR NEGATIVE 12/14/2020 2301   PROTEINUR NEGATIVE 12/14/2020 2301   NITRITE POSITIVE (A) 12/14/2020 2301   LEUKOCYTESUR TRACE (A) 12/14/2020 2301   LEUKOCYTESUR Negative 09/09/2012 1640    Radiological Exams on Admission: CT ABDOMEN PELVIS W CONTRAST  Result Date: 12/14/2020 CLINICAL DATA:  75 year old female with abdominal pain. EXAM: CT ABDOMEN AND PELVIS WITH CONTRAST TECHNIQUE: Multidetector CT imaging of the abdomen and pelvis was performed using the standard protocol following bolus administration of intravenous contrast. CONTRAST:  167m OMNIPAQUE IOHEXOL 300 MG/ML  SOLN COMPARISON:  CT abdomen pelvis dated 08/16/2020. FINDINGS: Lower chest: The visualized lung bases are clear. Coronary vascular calcifications noted. No intra-abdominal free air or free fluid. Hepatobiliary: Innumerable hypoenhancing lesions in the liver measuring up to 5 cm, new since the prior CT consistent with metastatic disease. There is irregularity and nodularity of the liver contour which may represent cirrhosis or pseudo cirrhosis. No intrahepatic biliary ductal dilatation. Cholecystectomy. No retained calcified stone noted in the central CBD. Pancreas: Unremarkable. No pancreatic ductal dilatation or surrounding inflammatory changes. Spleen: Normal in size without focal abnormality. Adrenals/Urinary Tract: The adrenal glands unremarkable. There is no hydronephrosis on either side. There is apparent faint heterogeneous nephrogram in the inferior pole of the right kidney. Correlation with  urinalysis recommended to exclude pyelonephritis. No drainable fluid collection or abscess. The visualized ureters and urinary bladder appear unremarkable. Stomach/Bowel: There is sigmoid diverticulosis and scattered colonic diverticula without active inflammatory changes. There is no bowel obstruction or active inflammation. The appendix is normal. Vascular/Lymphatic: Advanced aortoiliac atherosclerotic disease. The IVC is unremarkable. No portal venous gas. There is no adenopathy. Reproductive: Hysterectomy.  No adnexal masses. Other: None Musculoskeletal: Degenerative changes of the spine. No acute osseous pathology. IMPRESSION: 1. Innumerable hepatic metastatic disease, new since the prior CT. 2. Colonic diverticulosis. No bowel obstruction. Normal appendix. 3. Faint heterogeneous nephrogram in the inferior pole of the right kidney. Correlation with urinalysis recommended to exclude pyelonephritis. No drainable fluid collection or abscess. 4. Aortic Atherosclerosis (ICD10-I70.0). Electronically Signed   By: AAnner CreteM.D.   On: 12/14/2020 22:57   DG Chest Portable 1 View  Result Date: 12/14/2020 CLINICAL DATA:  Shortness of breath EXAM: PORTABLE CHEST 1 VIEW COMPARISON:  None. FINDINGS: The heart size and mediastinal contours are within normal limits. Aortic knob calcifications are seen. Both lungs are clear. The visualized skeletal structures are unremarkable. IMPRESSION: No active disease. Electronically Signed   By: BPrudencio PairM.D.   On: 12/14/2020 22:25     Assessment/Plan 75year old female with history of HTN, insulin-dependent type 2 diabetes, depression, breast cancer status post lumpectomy and XRT in 2015 on anastrozole, followed yearly by oncology, DVT on Eliquis, CAD, who presents to the ED for  evaluation of generalized abdominal pain .    Abdominal pain -Abdominal pain x1 day, generalized, severe  -CT abdomen and pelvis showing innumerable hepatic metastatic disease and possible  pyelonephritis -Pain control -Treat UTI as outlined below and consider oncology consult    Acute pyelonephritis -Abnormal urinalysis with possible pyelonephritis seen on CT -Continue Rocephin    Hepatic metastases ([place])   History of breast cancer -Patient is status post lumpectomy and XRT in 2015, currently on anastrozole, followed yearly, last seen May 2021 -Consider oncology consult in the a.m.  CAD -Continue home metoprolol, isosorbide, aspirin and Cozaar  Depression -Continue paroxetine pending med rec    Diabetes ([place]) -Sliding scale insulin coverage    Chronic anticoagulation History of DVT -Continue Eliquis    DVT prophylaxis: Eliquis Code Status: full code  Family Communication:  none  Disposition Plan: Back to previous home environment Consults called: none  Status: Observation    Athena Masse MD [facility]     12/15/2020, 12:24 AM

## 2020-12-15 NOTE — Plan of Care (Signed)

## 2020-12-16 ENCOUNTER — Telehealth: Payer: Self-pay

## 2020-12-16 ENCOUNTER — Other Ambulatory Visit: Payer: Self-pay

## 2020-12-16 DIAGNOSIS — R1084 Generalized abdominal pain: Secondary | ICD-10-CM | POA: Diagnosis not present

## 2020-12-16 DIAGNOSIS — E1100 Type 2 diabetes mellitus with hyperosmolarity without nonketotic hyperglycemic-hyperosmolar coma (NKHHC): Secondary | ICD-10-CM | POA: Diagnosis not present

## 2020-12-16 DIAGNOSIS — C787 Secondary malignant neoplasm of liver and intrahepatic bile duct: Secondary | ICD-10-CM

## 2020-12-16 DIAGNOSIS — Z7901 Long term (current) use of anticoagulants: Secondary | ICD-10-CM | POA: Diagnosis not present

## 2020-12-16 DIAGNOSIS — N10 Acute tubulo-interstitial nephritis: Secondary | ICD-10-CM | POA: Diagnosis not present

## 2020-12-16 LAB — COMPREHENSIVE METABOLIC PANEL
ALT: 52 U/L — ABNORMAL HIGH (ref 0–44)
AST: 65 U/L — ABNORMAL HIGH (ref 15–41)
Albumin: 3.1 g/dL — ABNORMAL LOW (ref 3.5–5.0)
Alkaline Phosphatase: 298 U/L — ABNORMAL HIGH (ref 38–126)
Anion gap: 9 (ref 5–15)
BUN: 18 mg/dL (ref 8–23)
CO2: 28 mmol/L (ref 22–32)
Calcium: 9 mg/dL (ref 8.9–10.3)
Chloride: 99 mmol/L (ref 98–111)
Creatinine, Ser: 0.87 mg/dL (ref 0.44–1.00)
GFR, Estimated: >60 mL/min (ref >60)
Glucose, Bld: 116 mg/dL — ABNORMAL HIGH (ref 70–99)
Potassium: 4.3 mmol/L (ref 3.5–5.1)
Sodium: 136 mmol/L (ref 135–145)
Total Bilirubin: 0.6 mg/dL (ref 0.3–1.2)
Total Protein: 6.2 g/dL — ABNORMAL LOW (ref 6.5–8.1)

## 2020-12-16 LAB — CBC
HCT: 35.3 % — ABNORMAL LOW (ref 36.0–46.0)
Hemoglobin: 11.5 g/dL — ABNORMAL LOW (ref 12.0–15.0)
MCH: 29.8 pg (ref 26.0–34.0)
MCHC: 32.6 g/dL (ref 30.0–36.0)
MCV: 91.5 fL (ref 80.0–100.0)
Platelets: 171 10*3/uL (ref 150–400)
RBC: 3.86 MIL/uL — ABNORMAL LOW (ref 3.87–5.11)
RDW: 13.8 % (ref 11.5–15.5)
WBC: 5.7 10*3/uL (ref 4.0–10.5)
nRBC: 0 % (ref 0.0–0.2)

## 2020-12-16 LAB — AFP TUMOR MARKER: AFP, Serum, Tumor Marker: 2.4 ng/mL (ref 0.0–9.2)

## 2020-12-16 LAB — GLUCOSE, CAPILLARY
Glucose-Capillary: 112 mg/dL — ABNORMAL HIGH (ref 70–99)
Glucose-Capillary: 185 mg/dL — ABNORMAL HIGH (ref 70–99)
Glucose-Capillary: 139 mg/dL — ABNORMAL HIGH (ref 70–99)
Glucose-Capillary: 165 mg/dL — ABNORMAL HIGH (ref 70–99)

## 2020-12-16 LAB — CANCER ANTIGEN 19-9: CA 19-9: 85 U/mL — ABNORMAL HIGH (ref 0–35)

## 2020-12-16 LAB — CEA: CEA: 206 ng/mL — ABNORMAL HIGH (ref 0.0–4.7)

## 2020-12-16 LAB — CANCER ANTIGEN 27.29: CA 27.29: 35 U/mL (ref 0.0–38.6)

## 2020-12-16 NOTE — Clinical Social Work Note (Signed)
Per mobility tech, patient dropped to 85% on room air. Potential discharge plan for tomorrow. Ordered oxygen through [facility].  Dayton Scrape, [place]

## 2020-12-16 NOTE — Telephone Encounter (Addendum)
Dr. Grayland Ormond has requested liver biopsy as outpatient. Remains in hospital under observation. (The patient remains OBS appropriate and will d/c before 2 midnights).  Order for biopsy placed and checklist faxed to scheduling. We have obtained permission to hold Eliquis, however, they do not want to start the hold until the biopsy date is arranged. Awaiting scheduling.  Per Dr. Grayland Ormond: Steffanie Dunn- I talked with [facility]- asa on hold now, stop eliquis 3 days prior to biopsy and restart as soon after as possible

## 2020-12-16 NOTE — Progress Notes (Signed)
Nutrition Follow-up  DOCUMENTATION CODES:   Obesity unspecified  INTERVENTION:   -Continue MVI with minerals daily -Continue Glucerna Shake po TID, each supplement provides 220 kcal and 10 grams of protein  NUTRITION DIAGNOSIS:   Increased nutrient needs related to acute illness (possible pyleonephritis and metastatic disease) as evidenced by estimated needs.  Ongoing  GOAL:   Patient will meet greater than or equal to 90% of their needs  Progressing   MONITOR:   PO intake,Supplement acceptance,Labs,Weight trends,Skin,I & O's  REASON FOR ASSESSMENT:   Malnutrition Screening Tool    ASSESSMENT:   75 year old female with history of HTN, insulin-dependent type 2 diabetes, depression, breast cancer status post lumpectomy and XRT in 2015 on anastrozole, followed yearly by oncology, DVT on Eliquis, CAD, who presents to the ED for evaluation of generalized abdominal pain .  Reviewed I/O's: +190 ml x 24 hours and +530 ml since admission  UOP: 250 ml x 24 hours  Pt ambulating in hall at time of visit.   Pt remains with good appetite. Noted meal completion 50-100%. Pt is consuming Glucerna supplements per MAR.   Per oncology notes, plan for liver biopsy as an outpatient. Per TOC notes, likely discharge home tomorrow.   Labs reviewed: CBGS: 112-231 (inpatient orders for glycemic control are 0-15 units insulin aspart TID with meals and 0-5 units insulin aspart daily at bedtime).   Diet Order:   Diet Order            Diet heart healthy/carb modified Room service appropriate? Yes; Fluid consistency: Thin  Diet effective now                 EDUCATION NEEDS:   No education needs have been identified at this time  Skin:  Skin Assessment: Reviewed RN Assessment  Last BM:  12/13/20  Height:   Ht Readings from Last 1 Encounters:  12/15/20 5\' 4"  (1.626 m)    Weight:   Wt Readings from Last 1 Encounters:  12/15/20 85.5 kg    Ideal Body Weight:  54.5 kg  BMI:   Body mass index is 32.37 kg/m.  Estimated Nutritional Needs:   Kcal:  2000-2200  Protein:  105-120 grams  Fluid:  > 2 L    Loistine Chance, RD, LDN, [place] Registered Dietitian II Certified Diabetes Care and Education Specialist Please refer to [facility] for RD and/or RD on-call/weekend/after hours pager

## 2020-12-16 NOTE — Progress Notes (Signed)
Mobility Specialist - Progress Note   12/16/20 1500  Mobility  Activity Ambulated in hall  Level of Assistance Modified independent, requires aide device or extra time  Assistive Device None  Distance Ambulated (ft) 180 ft  Mobility Response Tolerated well  Mobility performed by Mobility specialist  $Mobility charge 1 Mobility    Pre-mobility: 84 HR, 93% SpO2 During mobility: 94 HR, 85% SpO2 Post-mobility: 87 HR, 93% SpO2   Pt initially hesitant to session this date as she stated she had ambulated in hallway twice. Family at bedside, encouragement from daughter to participate and pt becomes agreeable. Pt ambulated without AD. No LOB. ModI. Noted some heavy breathing, c/o SOB. O2 desat to a low of 85% during activity on RA. PLB educated and utilized to bring sats up to 89% for remaining distance. Upon return to seated position, O2 sat up to 93%. Pt returned supine with LE elevated per pt request. Nurse notified of performance.    Kathee Delton Mobility Specialist 12/16/20, 3:18 PM

## 2020-12-16 NOTE — Plan of Care (Signed)

## 2020-12-16 NOTE — Care Management Obs Status (Signed)
[place] NOTIFICATION   Patient Details  Name: CLETA HEATLEY MRN: [id number] Date of Birth: 03-Feb-1946   Medicare Observation Status Notification Given:  Yes    Candie Chroman, LCSW 12/16/2020, 9:47 AM

## 2020-12-16 NOTE — Progress Notes (Signed)
PROGRESS NOTE    Tammy Hatfield  RKY:[id number] DOB: [birth year] DOA: 12/14/2020 PCP: Baxter Hire, MD    Brief Narrative:  HPI was taken from Dr. Damita Dunnings: Tammy Hatfield a 75 y.o.femalewith medical history significant forHTN, insulin-dependent type 2 diabetes, depression, COPD history of breast cancer status post lumpectomy and XRT in 2015 on anastrozole, followed yearly by oncology, DVT on Eliquis, CAD, who presents to the ED for evaluation of generalized abdominal pain in the setting of prior hysterectomy and cholecystectomy. Pain started on awakening on the day of arrival and is of 10 out of 10 intensity, generalized but mostly to the lower abdomen and epigastrium. She has no associated nausea, vomiting or change in bowel habits denies dysuria. Has no chest pain or shortness of breath, cough, fever or chills. ED course: On arrival: BP 138/48 with pulse 84, respirations 26 and O2 sat 98% on room air. Blood work including CBC, CMP and lipase unremarkable except for elevated liver enzymes with AST/ALT of 84/70 and alk phos of 344. Urinalysis with positive nitrites and trace leukocyte esterase. EKG, viewed and interpreted myself: NSR at 99 with no acute ST-T wave changes Imaging: Chest x-ray no active disease CT abdomen and pelvis: Innumerable hepatic metastatic disease new since prior CT December 2021. Mild abnormality inferior pole of the right kidney that might suggest pyelonephritis.  Patient started on Rocephin. Hospitalist consulted for admission patient was pain controlled by admission    Consultants:   Oncology  Procedures:   Antimicrobials:   Ceftriaxone   Subjective: With abdominal pain this am. No diarrhea. No nausea. Also c/o her urine is dark yellow but not bloody  Objective: Vitals:   12/16/20 0419 12/16/20 0808 12/16/20 1126 12/16/20 1155  BP: 135/63 (!) 151/58 (!) 130/47 105/74  Pulse: 76 85 78 88  Resp: _0 Temp: 98.4 F (36.9  C) 99.3 F (37.4 C) 98.5 F (36.9 C) 98 F (36.7 C)  TempSrc: Oral Oral Oral Oral  SpO2: 93% 95% 94% 96%  Weight:      Height:        Intake/Output Summary (Last 24 hours) at 12/16/2020 1315 Last data filed at 12/16/2020 1013 Gross per 24 hour  Intake 240 ml  Output 250 ml  Net -10 ml   Filed Weights   12/14/20 1927 12/15/20 0121  Weight: 83.5 kg 85.5 kg    Examination:  General exam: Appears calm and comfortable  Respiratory system: Clear to auscultation. Respiratory effort normal. Cardiovascular system: S1 & S2 heard, RRR. No JVD, murmurs, rubs, gallops or clicks.  Gastrointestinal system: Abdomen is nondistended, soft and nontender. Normal bowel sounds heard. Central nervous system: Alert and oriented.  Grossly Extremities: No edema Skin: Warm dry Psychiatry: Judgement and insight appear normal. Mood & affect appropriate.     Data Reviewed: I have personally reviewed following labs and imaging studies  CBC: Recent Labs  Lab 12/14/20 1931 12/15/20 0841 12/16/20 0514  WBC 8.8 6.4 5.7  HGB 12.2 12.2 11.5*  HCT 37.8 37.4 35.3*  MCV 90.4 92.1 91.5  PLT 208 175 315   Basic Metabolic Panel: Recent Labs  Lab 12/14/20 1931 12/15/20 0841 12/16/20 0514  NA 138 138 136  K 4.4 4.0 4.3  CL 102 99 99  CO2 _1 GLUCOSE 94 91 116*  BUN _2 CREATININE 0.96 1.13* 0.87  CALCIUM 9.3 9.0 9.0   GFR: Estimated Creatinine Clearance: 60 mL/min (by C-G formula based on  SCr of 0.87 mg/dL). Liver Function Tests: Recent Labs  Lab 12/14/20 1931 12/16/20 0514  AST 84* 65*  ALT 70* 52*  ALKPHOS 344* 298*  BILITOT 1.1 0.6  PROT 6.9 6.2*  ALBUMIN 3.8 3.1*   Recent Labs  Lab 12/14/20 1931  LIPASE 39   No results for input(s): AMMONIA in the last 168 hours. Coagulation Profile: No results for input(s): INR, PROTIME in the last 168 hours. Cardiac Enzymes: No results for input(s): CKTOTAL, CKMB, CKMBINDEX, TROPONINI in the last 168 hours. BNP (last 3  results) No results for input(s): PROBNP in the last 8760 hours. HbA1C: Recent Labs    12/15/20 0521  HGBA1C 6.6*   CBG: Recent Labs  Lab 12/15/20 1148 12/15/20 1616 12/15/20 2103 12/16/20 0741 12/16/20 1159  GLUCAP 102* 121* 231* 112* 185*   Lipid Profile: No results for input(s): CHOL, HDL, LDLCALC, TRIG, CHOLHDL, LDLDIRECT in the last 72 hours. Thyroid Function Tests: No results for input(s): TSH, T4TOTAL, FREET4, T3FREE, THYROIDAB in the last 72 hours. Anemia Panel: No results for input(s): VITAMINB12, FOLATE, FERRITIN, TIBC, IRON, RETICCTPCT in the last 72 hours. Sepsis Labs: No results for input(s): PROCALCITON, LATICACIDVEN in the last 168 hours.  Recent Results (from the past 240 hour(s))  SARS CORONAVIRUS 2 (TAT 6-24 HRS) Nasopharyngeal Nasopharyngeal Swab     Status: None   Collection Time: 12/14/20 11:01 PM   Specimen: Nasopharyngeal Swab  Result Value Ref Range Status   SARS Coronavirus 2 NEGATIVE NEGATIVE Final    Comment: (NOTE) SARS-CoV-2 target nucleic acids are NOT DETECTED.  The SARS-CoV-2 RNA is generally detectable in upper and lower respiratory specimens during the acute phase of infection. Negative results do not preclude SARS-CoV-2 infection, do not rule out co-infections with other pathogens, and should not be used as the sole basis for treatment or other patient management decisions. Negative results must be combined with clinical observations, patient history, and epidemiological information. The expected result is Negative.  Fact Sheet for Patients: SugarRoll.be  Fact Sheet for Healthcare Providers: https://www.woods-mathews.com/  This test is not yet approved or cleared by the [place] FDA and  has been authorized for detection and/or diagnosis of SARS-CoV-2 by FDA under an Emergency Use Authorization (EUA). This EUA will remain  in effect (meaning this test can be used) for the duration of  the COVID-19 declaration under Se ction 564(b)(1) of the Act, 21 U.S.C. section 360bbb-3(b)(1), unless the authorization is terminated or revoked sooner.  Performed at [facility], [place] [address], [place], [place] [address]   Urine Culture     Status: Abnormal (Preliminary result)   Collection Time: 12/14/20 11:01 PM   Specimen: Urine, Random  Result Value Ref Range Status   Specimen Description   Final    URINE, RANDOM Performed at [facility], [address], [place], [place] [address]    Special Requests   Final    NONE Performed at [facility], [address] [place], [place] [address]    Culture (A)  Final    >=100,000 COLONIES/mL GRAM NEGATIVE RODS CULTURE REINCUBATED FOR BETTER GROWTH SUSCEPTIBILITIES TO FOLLOW Performed at [facility], [place] [address], [place], [place] [address]    Report Status PENDING  Incomplete         Radiology Studies: CT CHEST WO CONTRAST  Result Date: 12/15/2020 CLINICAL DATA:  Abdominal pain, yesterday CT abdomen innumerable new hepatic metastatic lesions. Staging workup, cancer of unknown primary. Patient does have a history  of breast cancer and lumpectomy in 2015. EXAM: CT CHEST WITHOUT CONTRAST TECHNIQUE: Multidetector CT imaging of the chest was performed following the standard protocol without IV contrast. COMPARISON:  CT abdomen 12/14/2020 FINDINGS: Cardiovascular: Coronary, aortic arch, and branch vessel atherosclerotic vascular disease. Mediastinum/Nodes: There is pathologic prevascular and right paratracheal adenopathy. Index right paratracheal node 2.3 cm in short axis on image 50 series 2. Lungs/Pleura: 5.0 by 3.3 by 3.9 cm right apical lung mass with peribronchovascular nodularity extending from this mass down towards the right hilum. Interstitial irregularity and thickening adjacent to the mass suspicious for lymphangitic tumor spread. Small satellite lesions including a 0.6 by 0.6 cm right upper  lobe lesion on image 39 series 3. I not see well-defined chest wall invasion or rib destruction at this time. Prior radiation port anteriorly in the right upper lobe and right middle lobe. 3 mm likely calcified right middle lobe pulmonary nodule on image 94 series 3. Centrilobular emphysema. 3 by 4 mm lingular nodule. Bandlike scarring or atelectasis in the left lower lobe posterior basal segment. Upper Abdomen: Nodular liver contour from cirrhosis or pseudo cirrhosis. Musculoskeletal: Thoracic spondylosis. Other: 2.6 by 1.6 cm focus of architectural distortion medially in the right breast on image 67 series 2. While the patient had a previous lumpectomy in the medial breast, this seems to me to be substantially more medial than the reported lumpectomy tumor, and also closer to the pectoralis muscle. IMPRESSION: 1. 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 Pathologic mediastinal adenopathy. 2. Nodular liver, shown to be due to multiple masses on recent abdominal CT. 3. Soft tissue mass far medially in the right breast anterior to the right edge of the sternum. Although the patient does have a history of lumpectomy and lumpectomy scar sites can appear similar to this lesion, the lesion seems to me to be substantially further medial to the site of prior breast mass and as such could represent new tumor or recurrence. Once no longer acutely ill and on an outpatient basis, nuclear medicine PET-CT may be helpful in further assessment and further staging workup. 4. Other imaging findings of potential clinical significance: Aortic Atherosclerosis (ICD10-I70.0) and Emphysema (ICD10-J43.9). Coronary atherosclerosis. Electronically Signed   By: Van Clines M.D.   On: 12/15/2020 16:07   CT ABDOMEN PELVIS W CONTRAST  Result Date: 12/14/2020 CLINICAL DATA:  75 year old female with abdominal pain. EXAM: CT ABDOMEN AND PELVIS WITH CONTRAST TECHNIQUE:  Multidetector CT imaging of the abdomen and pelvis was performed using the standard protocol following bolus administration of intravenous contrast. CONTRAST:  137m OMNIPAQUE IOHEXOL 300 MG/ML  SOLN COMPARISON:  CT abdomen pelvis dated 08/16/2020. FINDINGS: Lower chest: The visualized lung bases are clear. Coronary vascular calcifications noted. No intra-abdominal free air or free fluid. Hepatobiliary: Innumerable hypoenhancing lesions in the liver measuring up to 5 cm, new since the prior CT consistent with metastatic disease. There is irregularity and nodularity of the liver contour which may represent cirrhosis or pseudo cirrhosis. No intrahepatic biliary ductal dilatation. Cholecystectomy. No retained calcified stone noted in the central CBD. Pancreas: Unremarkable. No pancreatic ductal dilatation or surrounding inflammatory changes. Spleen: Normal in size without focal abnormality. Adrenals/Urinary Tract: The adrenal glands unremarkable. There is no hydronephrosis on either side. There is apparent faint heterogeneous nephrogram in the inferior pole of the right kidney. Correlation with urinalysis recommended to exclude pyelonephritis. No drainable fluid collection or abscess. The visualized ureters and urinary bladder appear unremarkable.  Stomach/Bowel: There is sigmoid diverticulosis and scattered colonic diverticula without active inflammatory changes. There is no bowel obstruction or active inflammation. The appendix is normal. Vascular/Lymphatic: Advanced aortoiliac atherosclerotic disease. The IVC is unremarkable. No portal venous gas. There is no adenopathy. Reproductive: Hysterectomy.  No adnexal masses. Other: None Musculoskeletal: Degenerative changes of the spine. No acute osseous pathology. IMPRESSION: 1. Innumerable hepatic metastatic disease, new since the prior CT. 2. Colonic diverticulosis. No bowel obstruction. Normal appendix. 3. Faint heterogeneous nephrogram in the inferior pole of the right  kidney. Correlation with urinalysis recommended to exclude pyelonephritis. No drainable fluid collection or abscess. 4. Aortic Atherosclerosis (ICD10-I70.0). Electronically Signed   By: Anner Crete M.D.   On: 12/14/2020 22:57   DG Chest Portable 1 View  Result Date: 12/14/2020 CLINICAL DATA:  Shortness of breath EXAM: PORTABLE CHEST 1 VIEW COMPARISON:  None. FINDINGS: The heart size and mediastinal contours are within normal limits. Aortic knob calcifications are seen. Both lungs are clear. The visualized skeletal structures are unremarkable. IMPRESSION: No active disease. Electronically Signed   By: Prudencio Pair M.D.   On: 12/14/2020 22:25        Scheduled Meds: . apixaban  5 mg Oral BID  . insulin aspart  0-15 Units Subcutaneous TID WC  . insulin aspart  0-5 Units Subcutaneous QHS  . losartan  50 mg Oral Daily  . metoprolol succinate  50 mg Oral Daily  . nystatin   Topical BID  . PARoxetine  20 mg Oral Daily   Continuous Infusions: . cefTRIAXone (ROCEPHIN)  IV 1 g (12/15/20 2329)    Assessment & Plan:   Active Problems:   COPD (chronic obstructive pulmonary disease) (HCC)   HTN (hypertension)   Diabetes (HCC)   Chronic anticoagulation   Hepatic metastases (HCC)   Abdominal pain   History of breast cancer   Acute pyelonephritis   Acute pyelonephritis: positive UA and possible pyelonephritis seen on CT 4/8-gram-negative rods  Susceptibility pending  Continue IV Rocephin     Hepatic metastases: secondary to breast cancer. S/p lumpectomy and XRT in 2015, currently on anastrozole, followed yearly, last seen May 2021 CT chest completed, see result.  Oncology consulted and input pending  CAD: Continue metoprolol and losartan    Depression: Severity unknown.  Continue paroxetine   DM2: With hyperglycemia Hemoglobin A1c 6.6 BG's overall stable Continue R-ISS   Transminitis: likely secondary to liver mets. Will continue to monitor      DVT  prophylaxis: Eliquis Code Status: Full Family Communication: None at bedside Disposition Plan:  Status is: Observation  The patient remains OBS appropriate and will d/c before 2 midnights.  Dispo: The patient is from: Home              Anticipated d/c is to: Home              Patient currently is not medically stable to d/c.   Difficult to place patient No            LOS: 0 days   Time spent: 35 minutes with more than 50% on [place], MD [facility] Pager [phone]  If 7PM-7AM, please contact night-coverage 12/16/2020, 1:15 PM

## 2020-12-17 DIAGNOSIS — C801 Malignant (primary) neoplasm, unspecified: Secondary | ICD-10-CM

## 2020-12-17 DIAGNOSIS — C787 Secondary malignant neoplasm of liver and intrahepatic bile duct: Secondary | ICD-10-CM

## 2020-12-17 DIAGNOSIS — R109 Unspecified abdominal pain: Secondary | ICD-10-CM

## 2020-12-17 DIAGNOSIS — J449 Chronic obstructive pulmonary disease, unspecified: Secondary | ICD-10-CM | POA: Diagnosis not present

## 2020-12-17 DIAGNOSIS — Z87891 Personal history of nicotine dependence: Secondary | ICD-10-CM | POA: Diagnosis not present

## 2020-12-17 LAB — GLUCOSE, CAPILLARY
Glucose-Capillary: 151 mg/dL — ABNORMAL HIGH (ref 70–99)
Glucose-Capillary: 183 mg/dL — ABNORMAL HIGH (ref 70–99)

## 2020-12-17 LAB — URINE CULTURE: Culture: >=100000 — AB

## 2020-12-17 MED ORDER — SODIUM CHLORIDE 0.9 % IV SOLN
INTRAVENOUS | Status: DC | PRN
  Administered 2020-12-17: 500 mL via INTRAVENOUS

## 2020-12-17 MED ORDER — BACID PO TABS: 2.0000 | ORAL_TABLET | Freq: Two times a day (BID) | ORAL | Status: DC

## 2020-12-17 MED ORDER — RISAQUAD PO CAPS: 1.0000 | ORAL_CAPSULE | Freq: Two times a day (BID) | ORAL | Status: DC

## 2020-12-17 MED ORDER — CEPHALEXIN 500 MG PO CAPS: 500.0000 mg | ORAL_CAPSULE | Freq: Four times a day (QID) | ORAL | 0 refills | Status: AC

## 2020-12-17 MED ORDER — RISAQUAD PO CAPS: 1.0000 | ORAL_CAPSULE | Freq: Two times a day (BID) | ORAL | 0 refills | Status: AC

## 2020-12-17 MED ORDER — NYSTATIN 100000 UNIT/GM EX POWD: Freq: Two times a day (BID) | CUTANEOUS | 0 refills | Status: DC

## 2020-12-17 NOTE — Progress Notes (Addendum)
Patient discharging home today. Per MD and RN, oxygen no longer needed. Called [employer] Representative Valeta Harms who reported she will put a pick up ticket for Monday (earliest they are available to pick up) and asked that oxygen be left on 2C until Monday when picked up by [employer]. Notified RN and MD.  Oleh Genin, LCSW [phone]

## 2020-12-17 NOTE — Progress Notes (Signed)
SATURATION QUALIFICATIONS: (This note is used to comply with regulatory documentation for home oxygen)  Patient Saturations on Room Air at Rest = 98%  Patient Saturations on Room Air while Ambulating = 92%  Patient Saturations on 0 Liters of oxygen while Ambulating = N/A

## 2020-12-17 NOTE — Discharge Summary (Addendum)
[name]:[id number] DOB: May 29, 1946 DOA: 12/14/2020  PCP: Baxter Hire, MD  Admit date: 12/14/2020 Discharge date: 12/17/2020  Admitted From: home Disposition:  home  Recommendations for Outpatient Follow-up:  1. Follow up with PCP in 1 week 2. Please obtain BMP/CBC in one week 3. Dr. Grayland Ormond oncology in one week    Discharge Condition:Stable CODE STATUS:full  Diet recommendation: Heart Healthy  Brief/Interim Summary: Per HPI: Tammy Hatfield is a 75 y.o. female with medical history significant for HTN, insulin-dependent type 2 diabetes, depression, COPD history of breast cancer status post lumpectomy and XRT in 2015 on anastrozole, followed yearly by oncology, DVT on Eliquis, CAD, who presents to the ED for evaluation of generalized abdominal pain in the setting of prior hysterectomy and cholecystectomy.  Pain started on awakening on the day of arrival and is of 10 out of 10 intensity, generalized but mostly to the lower abdomen and epigastrium.On Arrival: Blood work including CBC, CMP and lipase unremarkable except for elevated liver enzymes with AST/ALT of 84/70 and alk phos of 344.  Urinalysis with positive nitrites and trace leukocyte esterase.Chest x-ray no active disease CT abdomen and pelvis: Innumerable hepatic metastatic disease new since prior CT December 2021.  Mild abnormality inferior pole of the right kidney that might suggest pyelonephritis.Patient started on Rocephin and admitted to the hospital for further management.  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  Of note, patient had a CT of the abdomen and pelvis on August 19, 2020 did not reveal any metastatic lesions.  CEA and CA 19-9 are elevated, but this is likely secondary to liver metastasis and not a second primary.  Oncology will arrange for ultrasound-guided liver biopsy as outpatient to confirm diagnosis.  Patient will also require PET scan and MRI of the brain to complete the staging work-up. Patient was instructed not to resume her aspirin which was discontinued in the hospital. Per cardiology she will need to stop Eliquis 3 days before her biopsy date but she will follow up with oncology about the timing of biopsy. I told patient to not take aspirin for now .   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 She completed XRT in approximately January 2015. Patient could not tolerate letrozole, anastrozole, or Aromasin.  Her most recent mammogram on July 13, 2020 reported as BI-RADS 1. S/plumpectomy and XRT in 2015, currently on anastrozole, followed yearly, last seen May 2021     CAD: Continue metoprolol and losartan    Depression: Severity unknown.  Continue paroxetine   DM2: With hyperglycemia Hemoglobin A1c 6.6 Follow up with pcp   Transminitis: likely secondary to liver mets.   Discharge Diagnoses:  Active Problems:   COPD (chronic obstructive pulmonary disease) ([place])  HTN (hypertension)   Diabetes ([place])   Chronic anticoagulation   Hepatic metastases (HCC)   Abdominal pain   History of breast cancer   Acute  pyelonephritis    Discharge Instructions  Discharge Instructions    Call MD for:  temperature >100.4   Complete by: As directed    Diet - low sodium heart healthy   Complete by: As directed    Increase activity slowly   Complete by: As directed      Allergies as of 12/17/2020      Reactions   Avelox [moxifloxacin] Other (See Comments)   debilitating muscle pain   Oxycodone    Other reaction(s): Hallucination   Donepezil Other (See Comments)   Patient has severe nightmares    Amoxicillin Other (See Comments)   Yeast infection - tolerated cefazolin before DID THE REACTION INVOLVE: Swelling of the face/tongue/throat, SOB, or low BP? No Sudden or severe rash/hives, skin peeling, or the inside of the mouth or nose? No Did it require medical treatment? No When did it last happen?within the past 10 years If all above answers are "NO", may proceed with cephalosporin use.   Benazepril Cough   Bupropion    Dizziness, Headache   Byetta 10 Mcg Pen [exenatide] Diarrhea   Isosorbide Swelling   Headaches, issues with balance   Metformin Diarrhea   Neurontin [gabapentin] Other (See Comments)   Mouth blisters, joint pain, depression   Pantoprazole     Abdominal Pain   Sulfa Antibiotics Other (See Comments)   Headache   Ceclor [cefaclor] Rash   Tolerated cefazolin before.      Medication List    STOP taking these medications   aspirin EC 81 MG tablet   ibuprofen 200 MG tablet Commonly known as: ADVIL   ondansetron 4 MG tablet Commonly known as: ZOFRAN     TAKE these medications   acidophilus Caps capsule Take 1 capsule by mouth 2 (two) times daily for 14 days.   calcium carbonate 500 MG chewable tablet Commonly known as: TUMS - dosed in mg elemental calcium Chew 1,500 mg by mouth daily as needed for indigestion or heartburn.   cephALEXin 500 MG capsule Commonly known as: KEFLEX Take 1 capsule (500 mg total) by mouth 4 (four) times daily for 12 days.    cyanocobalamin 1000 MCG tablet Take 1,000 mcg by mouth daily.   Eliquis 5 MG Tabs tablet Generic drug: apixaban Take 5 mg by mouth 2 (two) times daily.   fexofenadine 180 MG tablet Commonly known as: ALLEGRA Take 180 mg by mouth daily as needed for allergies.   insulin aspart 100 UNIT/ML injection Commonly known as: novoLOG Inject 3 Units into the skin 3 (three) times daily with meals.   isosorbide mononitrate 30 MG 24 hr tablet Commonly known as: IMDUR Take 30 mg by mouth every morning.   losartan 50 MG tablet Commonly known as: COZAAR Take 50 mg by mouth every morning.   LUBRICATING EYE DROPS OP Place 1 drop into both eyes daily as needed (dry eyes).   metoprolol succinate 50 MG 24 hr tablet Commonly known as: TOPROL-XL Take 50 mg by mouth every morning. Take with or immediately following a meal.   nitroGLYCERIN 0.4 MG SL tablet Commonly known as: NITROSTAT Place 1 tablet (0.4 mg total) under the tongue every 5 (five) minutes as needed for chest pain.   nystatin powder Commonly known as: MYCOSTATIN/NYSTOP Apply topically 2 (two) times daily.   ondansetron 4 MG disintegrating tablet  Commonly known as: Zofran ODT Take 1 tablet (4 mg total) by mouth every 8 (eight) hours as needed for nausea or vomiting.   OneTouch Verio test strip Generic drug: glucose blood 1 each 3 (three) times daily.   oxyCODONE 5 MG immediate release tablet Commonly known as: Oxy IR/ROXICODONE Take 1-2 tablets (5-10 mg total) by mouth every 4 (four) hours as needed for moderate pain.   pantoprazole 20 MG tablet Commonly known as: PROTONIX Take 20 mg by mouth every morning.   PARoxetine 20 MG tablet Commonly known as: PAXIL Take 20 mg by mouth every morning.   pregabalin 75 MG capsule Commonly known as: LYRICA Take 1 capsule (75 mg total) by mouth 2 (two) times daily.   Semaglutide(0.25 or 0.5MG/DOS) 2 MG/1.5ML Sopn Inject 1 mg as directed every Sunday.   traMADol 50 MG  tablet Commonly known as: ULTRAM Take 1 tablet (50 mg total) by mouth every 6 (six) hours as needed for moderate pain.   Tresiba FlexTouch 200 UNIT/ML FlexTouch Pen Generic drug: insulin degludec Inject 66 Units into the skin at bedtime.   valACYclovir 500 MG tablet Commonly known as: VALTREX Take 500 mg by mouth 2 (two) times daily as needed (fever blisters).   Vitamin D3 25 MCG (1000 UT) Caps Take 1,000 Units by mouth daily.            Durable Medical Equipment  (From admission, onward)         Start     Ordered   12/16/20 1528  For home use only DME oxygen  Once       Question Answer Comment  Length of Need 12 Months   Mode or (Route) Nasal cannula   Liters per Minute 2   Frequency Continuous (stationary and portable oxygen unit needed)   Oxygen conserving device Yes      04 /08/22 1527          Follow-up Information    Baxter Hire, MD Follow up.   Specialty: Internal Medicine Contact information: [place] [address] [phone]        Lloyd Huger, MD Follow up in 1 week(s).   Specialty: Oncology Contact information: [place] [address] [phone]              Allergies  Allergen Reactions  . Avelox [Moxifloxacin] Other (See Comments)    debilitating muscle pain  . Oxycodone     Other reaction(s): Hallucination  . Donepezil Other (See Comments)    Patient has severe nightmares   . Amoxicillin Other (See Comments)    Yeast infection - tolerated cefazolin before DID THE REACTION INVOLVE: Swelling of the face/tongue/throat, SOB, or low BP? No Sudden or severe rash/hives, skin peeling, or the inside of the mouth or nose? No Did it require medical treatment? No When did it last happen?within the past 10 years If all above answers are "NO", may proceed with cephalosporin use.  . Benazepril Cough  . Bupropion     Dizziness, Headache  . Byetta 10 Mcg Pen [Exenatide] Diarrhea   . Isosorbide Swelling    Headaches, issues with balance  . Metformin Diarrhea  . Neurontin [Gabapentin] Other (See Comments)    Mouth blisters, joint pain, depression   . Pantoprazole      Abdominal Pain  . Sulfa Antibiotics Other (See Comments)    Headache  . Ceclor [Cefaclor] Rash    Tolerated cefazolin before.    Consultations:  oncology   Procedures/Studies: CT CHEST WO CONTRAST  Result Date: 12/15/2020 CLINICAL DATA:  Abdominal pain, yesterday CT abdomen innumerable new hepatic metastatic lesions. Staging workup, cancer of unknown primary. Patient does have a history of breast cancer and lumpectomy in 2015. EXAM: CT CHEST WITHOUT CONTRAST TECHNIQUE: Multidetector CT imaging of the chest was performed following the standard protocol without IV contrast. COMPARISON:  CT abdomen 12/14/2020 FINDINGS: Cardiovascular: Coronary, aortic arch, and branch vessel atherosclerotic vascular disease. Mediastinum/Nodes: There is pathologic prevascular and right paratracheal adenopathy. Index right paratracheal node 2.3 cm in short axis on image 50 series 2. Lungs/Pleura: 5.0 by 3.3 by 3.9 cm right apical lung mass with peribronchovascular nodularity extending from this mass down towards the right hilum. Interstitial irregularity and thickening adjacent to the mass suspicious for lymphangitic tumor spread. Small satellite lesions including a 0.6 by 0.6 cm right upper lobe lesion on image 39 series 3. I not see well-defined chest wall invasion or rib destruction at this time. Prior radiation port anteriorly in the right upper lobe and right middle lobe. 3 mm likely calcified right middle lobe pulmonary nodule on image 94 series 3. Centrilobular emphysema. 3 by 4 mm lingular nodule. Bandlike scarring or atelectasis in the left lower lobe posterior basal segment. Upper Abdomen: Nodular liver contour from cirrhosis or pseudo cirrhosis. Musculoskeletal: Thoracic spondylosis. Other: 2.6 by 1.6 cm focus of  architectural distortion medially in the right breast on image 67 series 2. While the patient had a previous lumpectomy in the medial breast, this seems to me to be substantially more medial than the reported lumpectomy tumor, and also closer to the pectoralis muscle. IMPRESSION: 1. 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 Pathologic mediastinal adenopathy. 2. Nodular liver, shown to be due to multiple masses on recent abdominal CT. 3. Soft tissue mass far medially in the right breast anterior to the right edge of the sternum. Although the patient does have a history of lumpectomy and lumpectomy scar sites can appear similar to this lesion, the lesion seems to me to be substantially further medial to the site of prior breast mass and as such could represent new tumor or recurrence. Once no longer acutely ill and on an outpatient basis, nuclear medicine PET-CT may be helpful in further assessment and further staging workup. 4. Other imaging findings of potential clinical significance: Aortic Atherosclerosis (ICD10-I70.0) and Emphysema (ICD10-J43.9). Coronary atherosclerosis. Electronically Signed   By: Van Clines M.D.   On: 12/15/2020 16:07   CT ABDOMEN PELVIS W CONTRAST  Result Date: 12/14/2020 CLINICAL DATA:  75 year old female with abdominal pain. EXAM: CT ABDOMEN AND PELVIS WITH CONTRAST TECHNIQUE: Multidetector CT imaging of the abdomen and pelvis was performed using the standard protocol following bolus administration of intravenous contrast. CONTRAST:  154mL OMNIPAQUE IOHEXOL 300 MG/ML  SOLN COMPARISON:  CT abdomen pelvis dated 08/16/2020. FINDINGS: Lower chest: The visualized lung bases are clear. Coronary vascular calcifications noted. No intra-abdominal free air or free fluid. Hepatobiliary: Innumerable hypoenhancing lesions in the liver measuring up to 5 cm, new since the prior CT consistent with metastatic disease. There is  irregularity and nodularity of the liver contour which may represent cirrhosis or pseudo cirrhosis. No intrahepatic biliary ductal dilatation. Cholecystectomy. No retained calcified stone noted in the central CBD. Pancreas: Unremarkable. No pancreatic ductal dilatation or surrounding inflammatory changes. Spleen: Normal in size without focal abnormality. Adrenals/Urinary Tract: The adrenal glands unremarkable. There is no hydronephrosis on either  side. There is apparent faint heterogeneous nephrogram in the inferior pole of the right kidney. Correlation with urinalysis recommended to exclude pyelonephritis. No drainable fluid collection or abscess. The visualized ureters and urinary bladder appear unremarkable. Stomach/Bowel: There is sigmoid diverticulosis and scattered colonic diverticula without active inflammatory changes. There is no bowel obstruction or active inflammation. The appendix is normal. Vascular/Lymphatic: Advanced aortoiliac atherosclerotic disease. The IVC is unremarkable. No portal venous gas. There is no adenopathy. Reproductive: Hysterectomy.  No adnexal masses. Other: None Musculoskeletal: Degenerative changes of the spine. No acute osseous pathology. IMPRESSION: 1. Innumerable hepatic metastatic disease, new since the prior CT. 2. Colonic diverticulosis. No bowel obstruction. Normal appendix. 3. Faint heterogeneous nephrogram in the inferior pole of the right kidney. Correlation with urinalysis recommended to exclude pyelonephritis. No drainable fluid collection or abscess. 4. Aortic Atherosclerosis (ICD10-I70.0). Electronically Signed   By: Anner Crete M.D.   On: 12/14/2020 22:57   DG Chest Portable 1 View  Result Date: 12/14/2020 CLINICAL DATA:  Shortness of breath EXAM: PORTABLE CHEST 1 VIEW COMPARISON:  None. FINDINGS: The heart size and mediastinal contours are within normal limits. Aortic knob calcifications are seen. Both lungs are clear. The visualized skeletal structures are  unremarkable. IMPRESSION: No active disease. Electronically Signed   By: Prudencio Pair M.D.   On: 12/14/2020 22:25      Subjective: Mild abdominal pain. Reports has pain meds at home   Discharge Exam: Vitals:   12/17/20 0819 12/17/20 1413  BP: (!) 127/101 (!) 156/63  Pulse: 90 82  Resp: 17 18  Temp: 97.9 F (36.6 C) 97.7 F (36.5 C)  SpO2: 94% 95%   Vitals:   12/16/20 2328 12/17/20 0408 12/17/20 0819 12/17/20 1413  BP: (!) 158/65 (!) 154/61 (!) 127/101 (!) 156/63  Pulse: 80 84 90 82  Resp: 20 20 17 18   Temp: 98.6 F (37 C) 98.4 F (36.9 C) 97.9 F (36.6 C) 97.7 F (36.5 C)  TempSrc: Oral Oral  Oral  SpO2: 93% 93% 94% 95%  Weight:      Height:        General: Pt is alert, awake, not in acute distress Cardiovascular: RRR, S1/S2 +, no rubs, no gallops Respiratory: CTA bilaterally, no wheezing, no rhonchi Abdominal: Soft, NT, ND, bowel sounds + Extremities: no edema    The results of significant diagnostics from this hospitalization (including imaging, microbiology, ancillary and laboratory) are listed below for reference.     Microbiology: Recent Results (from the past 240 hour(s))  SARS CORONAVIRUS 2 (TAT 6-24 HRS) Nasopharyngeal Nasopharyngeal Swab     Status: None   Collection Time: 12/14/20 11:01 PM   Specimen: Nasopharyngeal Swab  Result Value Ref Range Status   SARS Coronavirus 2 NEGATIVE NEGATIVE Final    Comment: (NOTE) SARS-CoV-2 target nucleic acids are NOT DETECTED.  The SARS-CoV-2 RNA is generally detectable in upper and lower respiratory specimens during the acute phase of infection. Negative results do not preclude SARS-CoV-2 infection, do not rule out co-infections with other pathogens, and should not be used as the sole basis for treatment or other patient management decisions. Negative results must be combined with clinical observations, patient history, and epidemiological information. The expected result is Negative.  Fact Sheet for  Patients: SugarRoll.be  Fact Sheet for Healthcare Providers: https://www.woods-mathews.com/  This test is not yet approved or cleared by the [place] FDA and  has been authorized for detection and/or diagnosis of SARS-CoV-2 by FDA under an Emergency Use Authorization (EUA). This  EUA will remain  in effect (meaning this test can be used) for the duration of the COVID-19 declaration under Se ction 564(b)(1) of the Act, 21 U.S.C. section 360bbb-3(b)(1), unless the authorization is terminated or revoked sooner.  Performed at [facility], [place] [address], [place], [place] [address]   Urine Culture     Status: Abnormal   Collection Time: 12/14/20 11:01 PM   Specimen: Urine, Random  Result Value Ref Range Status   Specimen Description   Final    URINE, RANDOM Performed at [facility], [address] [place], [place] [address]    Special Requests   Final    NONE Performed at [facility], [place], [place] [address]    Culture >=100,000 COLONIES/mL ESCHERICHIA COLI (A)  Final   Report Status 12/17/2020 FINAL  Final   Organism ID, Bacteria ESCHERICHIA COLI (A)  Final      Susceptibility   Escherichia coli - MIC*    AMPICILLIN 8 SENSITIVE Sensitive     CEFAZOLIN <=4 SENSITIVE Sensitive     CEFEPIME <=0.12 SENSITIVE Sensitive     CEFTRIAXONE <=0.25 SENSITIVE Sensitive     CIPROFLOXACIN >=4 RESISTANT Resistant     GENTAMICIN <=1 SENSITIVE Sensitive     IMIPENEM <=0.25 SENSITIVE Sensitive     NITROFURANTOIN 64 INTERMEDIATE Intermediate     TRIMETH/SULFA >=320 RESISTANT Resistant     AMPICILLIN/SULBACTAM 4 SENSITIVE Sensitive     PIP/TAZO <=4 SENSITIVE Sensitive     * >=100,000 COLONIES/mL ESCHERICHIA COLI     Labs: BNP (last 3 results) No results for input(s): BNP in the last 8760 hours. Basic Metabolic Panel: Recent Labs  Lab 12/14/20 1931 12/15/20 0841 12/16/20 0514  NA 138 138 136   K 4.4 4.0 4.3  CL 102 99 99  CO2 26 27 28   GLUCOSE 94 91 116*  BUN 15 18 18   CREATININE 0.96 1.13* 0.87  CALCIUM 9.3 9.0 9.0   Liver Function Tests: Recent Labs  Lab 12/14/20 1931 12/16/20 0514  AST 84* 65*  ALT 70* 52*  ALKPHOS 344* 298*  BILITOT 1.1 0.6  PROT 6.9 6.2*  ALBUMIN 3.8 3.1*   Recent Labs  Lab 12/14/20 1931  LIPASE 39   No results for input(s): AMMONIA in the last 168 hours. CBC: Recent Labs  Lab 12/14/20 1931 12/15/20 0841 12/16/20 0514  WBC 8.8 6.4 5.7  HGB 12.2 12.2 11.5*  HCT 37.8 37.4 35.3*  MCV 90.4 92.1 91.5  PLT 208 175 171   Cardiac Enzymes: No results for input(s): CKTOTAL, CKMB, CKMBINDEX, TROPONINI in the last 168 hours. BNP: Invalid input(s): POCBNP CBG: Recent Labs  Lab 12/16/20 1159 12/16/20 1643 12/16/20 2054 12/17/20 0751 12/17/20 1133  GLUCAP 185* 139* 165* 151* 183*   D-Dimer No results for input(s): DDIMER in the last 72 hours. Hgb A1c Recent Labs    12/15/20 0521  HGBA1C 6.6*   Lipid Profile No results for input(s): CHOL, HDL, LDLCALC, TRIG, CHOLHDL, LDLDIRECT in the last 72 hours. Thyroid function studies No results for input(s): TSH, T4TOTAL, T3FREE, THYROIDAB in the last 72 hours.  Invalid input(s): FREET3 Anemia work up No results for input(s): VITAMINB12, FOLATE, FERRITIN, TIBC, IRON, RETICCTPCT in the last 72 hours. Urinalysis    Component Value Date/Time   COLORURINE YELLOW (A) 12/14/2020 2301   APPEARANCEUR CLEAR (A) 12/14/2020 2301   APPEARANCEUR Clear 09/09/2012 1640   LABSPEC 1.032 (H) 12/14/2020 2301   LABSPEC 1.009 09/09/2012 1640  PHURINE 6.0 12/14/2020 2301   GLUCOSEU NEGATIVE 12/14/2020 2301   GLUCOSEU Negative 09/09/2012 1640   HGBUR NEGATIVE 12/14/2020 2301   BILIRUBINUR NEGATIVE 12/14/2020 2301   BILIRUBINUR Negative 09/09/2012 1640   KETONESUR NEGATIVE 12/14/2020 2301   PROTEINUR NEGATIVE 12/14/2020 2301   NITRITE POSITIVE (A) 12/14/2020 2301   LEUKOCYTESUR TRACE (A)  12/14/2020 2301   LEUKOCYTESUR Negative 09/09/2012 1640   Sepsis Labs Invalid input(s): PROCALCITONIN,  WBC,  LACTICIDVEN Microbiology Recent Results (from the past 240 hour(s))  SARS CORONAVIRUS 2 (TAT 6-24 HRS) Nasopharyngeal Nasopharyngeal Swab     Status: None   Collection Time: 12/14/20 11:01 PM   Specimen: Nasopharyngeal Swab  Result Value Ref Range Status   SARS Coronavirus 2 NEGATIVE NEGATIVE Final    Comment: (NOTE) SARS-CoV-2 target nucleic acids are NOT DETECTED.  The SARS-CoV-2 RNA is generally detectable in upper and lower respiratory specimens during the acute phase of infection. Negative results do not preclude SARS-CoV-2 infection, do not rule out co-infections with other pathogens, and should not be used as the sole basis for treatment or other patient management decisions. Negative results must be combined with clinical observations, patient history, and epidemiological information. The expected result is Negative.  Fact Sheet for Patients: SugarRoll.be  Fact Sheet for Healthcare Providers: https://www.woods-mathews.com/  This test is not yet approved or cleared by the [place] FDA and  has been authorized for detection and/or diagnosis of SARS-CoV-2 by FDA under an Emergency Use Authorization (EUA). This EUA will remain  in effect (meaning this test can be used) for the duration of the COVID-19 declaration under Se ction 564(b)(1) of the Act, 21 U.S.C. section 360bbb-3(b)(1), unless the authorization is terminated or revoked sooner.  Performed at [facility], [place] [address], [place], [place] [address]   Urine Culture     Status: Abnormal   Collection Time: 12/14/20 11:01 PM   Specimen: Urine, Random  Result Value Ref Range Status   Specimen Description   Final    URINE, RANDOM Performed at [facility], [address] [place], [place] [address]    Special Requests   Final     NONE Performed at [facility], [place], [place] [address]    Culture >=100,000 COLONIES/mL ESCHERICHIA COLI (A)  Final   Report Status 12/17/2020 FINAL  Final   Organism ID, Bacteria ESCHERICHIA COLI (A)  Final      Susceptibility   Escherichia coli - MIC*    AMPICILLIN 8 SENSITIVE Sensitive     CEFAZOLIN <=4 SENSITIVE Sensitive     CEFEPIME <=0.12 SENSITIVE Sensitive     CEFTRIAXONE <=0.25 SENSITIVE Sensitive     CIPROFLOXACIN >=4 RESISTANT Resistant     GENTAMICIN <=1 SENSITIVE Sensitive     IMIPENEM <=0.25 SENSITIVE Sensitive     NITROFURANTOIN 64 INTERMEDIATE Intermediate     TRIMETH/SULFA >=320 RESISTANT Resistant     AMPICILLIN/SULBACTAM 4 SENSITIVE Sensitive     PIP/TAZO <=4 SENSITIVE Sensitive     * >=100,000 COLONIES/mL ESCHERICHIA COLI     Time coordinating discharge: Over 30 minutes  SIGNED:   Nolberto Hanlon, MD  [facility] 12/17/2020, 5:03 PM Pager   If 7PM-7AM, please contact night-coverage [email or web address] Password [email or web address]

## 2020-12-17 NOTE — Consult Note (Signed)
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   INTERVAL HISTORY: Patient is a 75 year ol female with a history of a stage I breat cancer how recently presented to the ED with increasing abdominal pain.  Subsequent image revealed innumerable liver metastasis of unknown origin.  She currently feels well and her abdominal pain has improved with her current narcotic regimen.  She has no neurologic complaints.  She denies any recent fevers or illnesses.  She has a good appetite and denies weight loss.  She has no chest pain, shortness of breath, cough, or hemoptysis.  She denies any nausea, vomiting, constipation, or diarrhea.  She has no melena or hematochezia.  She has no urinary complaints.  Patient offers no further specific complaints today.  REVIEW OF SYSTEMS:   Review of Systems  Constitutional: Negative.  Negative for fever, malaise/fatigue and weight loss.  Respiratory: Negative.  Negative for cough, hemoptysis and shortness of breath.   Cardiovascular: Negative.  Negative for chest pain and leg swelling.  Gastrointestinal: Positive for abdominal pain. Negative for blood in stool and melena.  Genitourinary: Negative.  Negative for dysuria.  Musculoskeletal: Negative.  Negative for back pain.  Skin: Negative.  Negative for rash.  Neurological: Negative.  Negative for dizziness, focal weakness, weakness and headaches.  Psychiatric/Behavioral: Negative.  The patient is not nervous/anxious.     As per HPI. Otherwise, a complete review of systems is negative.  PAST MEDICAL HISTORY: Past Medical History:  Diagnosis Date  . Anginal pain ([place])   . Anxiety   . Arthritis    knees  . Barrett's  esophagus   . Bleeds easily ([place])   . Breast cancer ([place]) 2014   RT LUMPECTOMY  . Bruises easily   . Cancer [facility]) 2014   right breast - s\p Radiation and lumpectomy and LN dissection  . Chronic sinusitis   . Complication of anesthesia    hard to wake after Fentanyl  . COPD (chronic obstructive pulmonary disease) ([place]) 2012  . Coronary artery disease    4 STENTS  . Degenerative lumbar disc   . Diabetes mellitus without complication ([place])   . Dysphagia   . Dyspnea    WITH EXERTION  . Esophageal reflux   . Fatty liver   . History of Helicobacter pylori infection   . History of hiatal hernia   . Hypercholesteremia   . Hyperlipidemia   . Hypertension   . Personal history of radiation therapy   . Pneumonia   . Radiation 2014   RT BREAST CA  . Sleep apnea    NO CPAP-HAD A UPPP  . Ulcer, stomach peptic    history of  . Vertigo    last episode 2017(approx)    PAST SURGICAL HISTORY: Past Surgical History:  Procedure Laterality Date  . ABDOMINAL HYSTERECTOMY    . BREAST BIOPSY Right 1985   NEG  . BREAST EXCISIONAL BIOPSY Right 2014   [facility] and DCIS  . BREAST LUMPECTOMY Right 2014  . BUNIONECTOMY Bilateral 1980  . CATARACT EXTRACTION W/PHACO Left 04/01/2018   Procedure: CATARACT EXTRACTION PHACO AND INTRAOCULAR LENS PLACEMENT ([place]) LEFT DIABETES IVA TOPICAL;  Surgeon: [name], MD;  Location: [place];  Service: Ophthalmology;  Laterality: Left;  Diabetic - insulin and oral meds sleep apnea  . CATARACT EXTRACTION W/PHACO Right 04/30/2018   Procedure: CATARACT EXTRACTION PHACO AND INTRAOCULAR LENS PLACEMENT ([place]) RIGHT DIABETIC;  Surgeon: [name], MD;  Location: [place];  Service: Ophthalmology;  Laterality: Right;  Diabetic - insulin sleep apnea  . CHOLECYSTECTOMY    . COLONOSCOPY N/A 07/12/2020   Procedure: COLONOSCOPY;  Surgeon: [name], MD;  Location: [facility] ENDOSCOPY;  Service: Endoscopy;  Laterality: N/A;  .  CORONARY ANGIOPLASTY    . CORONARY STENT INTERVENTION N/A 07/05/2017   Procedure: CORONARY STENT INTERVENTION;  Surgeon: [name], MD;  Location: [place] CV LAB;  Service: Cardiovascular;  Laterality: N/A;  . ESOPHAGOGASTRODUODENOSCOPY N/A 07/12/2020   Procedure: ESOPHAGOGASTRODUODENOSCOPY (EGD);  Surgeon: [name], MD;  Location: [facility] ENDOSCOPY;  Service: Endoscopy;  Laterality: N/A;  . LAPAROSCOPIC TUBAL LIGATION Bilateral 1967  . LEFT HEART CATH AND CORONARY ANGIOGRAPHY N/A 07/05/2017   Procedure: LEFT HEART CATH AND CORONARY ANGIOGRAPHY;  Surgeon: [name], MD;  Location: [place] CV LAB;  Service: Cardiovascular;  Laterality: N/A;  . LEFT HEART CATH AND CORONARY ANGIOGRAPHY Left 09/18/2018   Procedure: Left Heart Cath w/ Coronary Angiography;  Surgeon: [name], MD;  Location: [place] CV LAB;  Service: Cardiovascular;  Laterality: Left;  . LEFT HEART CATH AND CORONARY ANGIOGRAPHY Left 02/25/2019   Procedure: LEFT HEART CATH AND CORONARY ANGIOGRAPHY;  Surgeon: Isaias Cowman, MD;  Location: [place] CV LAB;  Service: Cardiovascular;  Laterality: Left;  . SHOULDER ARTHROSCOPY WITH ROTATOR CUFF REPAIR Right 02/09/2015   Procedure: SHOULDER ARTHROSCOPY WITH ROTATOR CUFF REPAIR,release long head biceps tendon,subacromial decompression.;  Surgeon: Leanor Kail, MD;  Location: [facility] ORS;  Service: Orthopedics;  Laterality: Right;  . TOTAL KNEE ARTHROPLASTY Left 07/21/2020   Procedure: TOTAL KNEE ARTHROPLASTY;  Surgeon: Corky Mull, MD;  Location: [facility] ORS;  Service: Orthopedics;  Laterality: Left;  . UMBILICAL HERNIA REPAIR N/A   . UVULOPALATOPHARYNGOPLASTY      FAMILY HISTORY: Family History  Problem Relation Age of Onset  . Breast cancer Neg Hx     ADVANCED DIRECTIVES (Y/N):  @ADVDIR @  HEALTH MAINTENANCE: Social History   Tobacco Use  . Smoking status: Former Smoker    Packs/day: 2.00    Years: 30.00    Pack years: 60.00     Quit date: 02/01/2000    Years since quitting: 20.8  . Smokeless tobacco: Never Used  . Tobacco comment: 07/18/2017 former smoker quit 1999  Vaping Use  . Vaping Use: Never used  Substance Use Topics  . Alcohol use: No    Alcohol/week: 0.0 standard drinks    Comment: may have drink on Holidays  . Drug use: No     Colonoscopy:  PAP:  Bone density:  Lipid panel:  Allergies  Allergen Reactions  . Avelox [Moxifloxacin] Other (See Comments)    debilitating muscle pain  . Oxycodone     Other reaction(s): Hallucination  . Donepezil Other (See Comments)    Patient has severe nightmares   . Amoxicillin Other (See Comments)    Yeast infection - tolerated cefazolin before DID THE REACTION INVOLVE: Swelling of the face/tongue/throat, SOB, or low BP? No Sudden or severe rash/hives, skin peeling, or the inside of the mouth or nose? No Did it require medical treatment? No When did it last happen?within the past 10 years If all above answers are "NO", may proceed with cephalosporin use.  . Benazepril Cough  . Bupropion  Dizziness, Headache  . Byetta 10 Mcg Pen [Exenatide] Diarrhea  . Isosorbide Swelling    Headaches, issues with balance  . Metformin Diarrhea  . Neurontin [Gabapentin] Other (See Comments)    Mouth blisters, joint pain, depression   . Pantoprazole      Abdominal Pain  . Sulfa Antibiotics Other (See Comments)    Headache  . Ceclor [Cefaclor] Rash    Tolerated cefazolin before.    Current Facility-Administered Medications  Medication Dose Route Frequency Provider Last Rate Last Admin  . 0.9 %  sodium chloride infusion   Intravenous PRN Nolberto Hanlon, MD 10 mL/hr at 12/17/20 0311 Infusion Verify at 12/17/20 0311  . acetaminophen (TYLENOL) tablet 650 mg  650 mg Oral Q6H PRN Athena Masse, MD       Or  . acetaminophen (TYLENOL) suppository 650 mg  650 mg Rectal Q6H PRN Athena Masse, MD      . apixaban Arne [place]) tablet 5 mg  5 mg Oral BID Wyvonnia Dusky, MD   5 mg at 12/16/20 2111  . cefTRIAXone (ROCEPHIN) 1 g in sodium chloride 0.9 % 100 mL IVPB  1 g Intravenous Q24H Athena Masse, MD   Stopped at 12/17/20 0144  . HYDROcodone-acetaminophen (NORCO/VICODIN) 5-325 MG per tablet 1-2 tablet  1-2 tablet Oral Q4H PRN Athena Masse, MD   2 tablet at 12/16/20 1511  . insulin aspart (novoLOG) injection 0-15 Units  0-15 Units Subcutaneous TID WC Athena Masse, MD   3 Units at 12/16/20 1724  . insulin aspart (novoLOG) injection 0-5 Units  0-5 Units Subcutaneous QHS Athena Masse, MD   2 Units at 12/15/20 2108  . losartan (COZAAR) tablet 50 mg  50 mg Oral Daily Wyvonnia Dusky, MD   50 mg at 12/16/20 0944  . metoprolol succinate (TOPROL-XL) 24 hr tablet 50 mg  50 mg Oral Daily Wyvonnia Dusky, MD   50 mg at 12/16/20 7253  . morphine 2 MG/ML injection 2 mg  2 mg Intravenous Q2H PRN Athena Masse, MD      . nystatin (MYCOSTATIN/NYSTOP) topical powder   Topical BID Wyvonnia Dusky, MD   Given at 12/16/20 2111  . ondansetron (ZOFRAN) tablet 4 mg  4 mg Oral Q6H PRN Athena Masse, MD       Or  . ondansetron [facility]) injection 4 mg  4 mg Intravenous Q6H PRN Athena Masse, MD      . PARoxetine (PAXIL) tablet 20 mg  20 mg Oral Daily Wyvonnia Dusky, MD   20 mg at 12/16/20 0943    OBJECTIVE: Vitals:   12/16/20 2328 12/17/20 0408  BP: (!) 158/65 (!) 154/61  Pulse: 80 84  Resp: 20 20  Temp: 98.6 F (37 C) 98.4 F (36.9 C)  SpO2: 93% 93%     Body mass index is 32.37 kg/m.    ECOG FS:1 - Symptomatic but completely ambulatory  General: Well-developed, well-nourished, no acute distress. Eyes: Pink conjunctiva, anicteric sclera. HEENT: Normocephalic, moist mucous membranes. Lungs: No audible wheezing or coughing. Heart: Regular rate and rhythm. Abdomen: Soft, nontender, no obvious distention. Musculoskeletal: No edema, cyanosis, or clubbing. Neuro: Alert, answering all questions appropriately. Cranial nerves grossly  intact. Skin: No rashes or petechiae noted. Psych: Normal affect. Lymphatics: No cervical, calvicular, axillary or inguinal LAD.   LAB RESULTS:  Lab Results  Component Value Date   NA 136 12/16/2020   K 4.3 12/16/2020   CL 99 12/16/2020  CO2 28 12/16/2020   GLUCOSE 116 (H) 12/16/2020   BUN 18 12/16/2020   CREATININE 0.87 12/16/2020   CALCIUM 9.0 12/16/2020   PROT 6.2 (L) 12/16/2020   ALBUMIN 3.1 (L) 12/16/2020   AST 65 (H) 12/16/2020   ALT 52 (H) 12/16/2020   ALKPHOS 298 (H) 12/16/2020   BILITOT 0.6 12/16/2020   GFRNONAA >60 12/16/2020   GFRAA >60 07/06/2017    Lab Results  Component Value Date   WBC 5.7 12/16/2020   NEUTROABS 4.0 07/13/2020   HGB 11.5 (L) 12/16/2020   HCT 35.3 (L) 12/16/2020   MCV 91.5 12/16/2020   PLT 171 12/16/2020     STUDIES: CT CHEST WO CONTRAST  Result Date: 12/15/2020 CLINICAL DATA:  Abdominal pain, yesterday CT abdomen innumerable new hepatic metastatic lesions. Staging workup, cancer of unknown primary. Patient does have a history of breast cancer and lumpectomy in 2015. EXAM: CT CHEST WITHOUT CONTRAST TECHNIQUE: Multidetector CT imaging of the chest was performed following the standard protocol without IV contrast. COMPARISON:  CT abdomen 12/14/2020 FINDINGS: Cardiovascular: Coronary, aortic arch, and branch vessel atherosclerotic vascular disease. Mediastinum/Nodes: There is pathologic prevascular and right paratracheal adenopathy. Index right paratracheal node 2.3 cm in short axis on image 50 series 2. Lungs/Pleura: 5.0 by 3.3 by 3.9 cm right apical lung mass with peribronchovascular nodularity extending from this mass down towards the right hilum. Interstitial irregularity and thickening adjacent to the mass suspicious for lymphangitic tumor spread. Small satellite lesions including a 0.6 by 0.6 cm right upper lobe lesion on image 39 series 3. I not see well-defined chest wall invasion or rib destruction at this time. Prior radiation port  anteriorly in the right upper lobe and right middle lobe. 3 mm likely calcified right middle lobe pulmonary nodule on image 94 series 3. Centrilobular emphysema. 3 by 4 mm lingular nodule. Bandlike scarring or atelectasis in the left lower lobe posterior basal segment. Upper Abdomen: Nodular liver contour from cirrhosis or pseudo cirrhosis. Musculoskeletal: Thoracic spondylosis. Other: 2.6 by 1.6 cm focus of architectural distortion medially in the right breast on image 67 series 2. While the patient had a previous lumpectomy in the medial breast, this seems to me to be substantially more medial than the reported lumpectomy tumor, and also closer to the pectoralis muscle. IMPRESSION: 1. 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 Pathologic mediastinal adenopathy. 2. Nodular liver, shown to be due to multiple masses on recent abdominal CT. 3. Soft tissue mass far medially in the right breast anterior to the right edge of the sternum. Although the patient does have a history of lumpectomy and lumpectomy scar sites can appear similar to this lesion, the lesion seems to me to be substantially further medial to the site of prior breast mass and as such could represent new tumor or recurrence. Once no longer acutely ill and on an outpatient basis, nuclear medicine PET-CT may be helpful in further assessment and further staging workup. 4. Other imaging findings of potential clinical significance: Aortic Atherosclerosis (ICD10-I70.0) and Emphysema (ICD10-J43.9). Coronary atherosclerosis. Electronically Signed   By: Van Clines M.D.   On: 12/15/2020 16:07   CT ABDOMEN PELVIS W CONTRAST  Result Date: 12/14/2020 CLINICAL DATA:  75 year old female with abdominal pain. EXAM: CT ABDOMEN AND PELVIS WITH CONTRAST TECHNIQUE: Multidetector CT imaging of the abdomen and pelvis was performed using the standard protocol following bolus administration of  intravenous contrast. CONTRAST:  124mL OMNIPAQUE IOHEXOL 300  MG/ML  SOLN COMPARISON:  CT abdomen pelvis dated 08/16/2020. FINDINGS: Lower chest: The visualized lung bases are clear. Coronary vascular calcifications noted. No intra-abdominal free air or free fluid. Hepatobiliary: Innumerable hypoenhancing lesions in the liver measuring up to 5 cm, new since the prior CT consistent with metastatic disease. There is irregularity and nodularity of the liver contour which may represent cirrhosis or pseudo cirrhosis. No intrahepatic biliary ductal dilatation. Cholecystectomy. No retained calcified stone noted in the central CBD. Pancreas: Unremarkable. No pancreatic ductal dilatation or surrounding inflammatory changes. Spleen: Normal in size without focal abnormality. Adrenals/Urinary Tract: The adrenal glands unremarkable. There is no hydronephrosis on either side. There is apparent faint heterogeneous nephrogram in the inferior pole of the right kidney. Correlation with urinalysis recommended to exclude pyelonephritis. No drainable fluid collection or abscess. The visualized ureters and urinary bladder appear unremarkable. Stomach/Bowel: There is sigmoid diverticulosis and scattered colonic diverticula without active inflammatory changes. There is no bowel obstruction or active inflammation. The appendix is normal. Vascular/Lymphatic: Advanced aortoiliac atherosclerotic disease. The IVC is unremarkable. No portal venous gas. There is no adenopathy. Reproductive: Hysterectomy.  No adnexal masses. Other: None Musculoskeletal: Degenerative changes of the spine. No acute osseous pathology. IMPRESSION: 1. Innumerable hepatic metastatic disease, new since the prior CT. 2. Colonic diverticulosis. No bowel obstruction. Normal appendix. 3. Faint heterogeneous nephrogram in the inferior pole of the right kidney. Correlation with urinalysis recommended to exclude pyelonephritis. No drainable fluid collection or abscess. 4. Aortic  Atherosclerosis (ICD10-I70.0). Electronically Signed   By: Anner Crete M.D.   On: 12/14/2020 22:57   DG Chest Portable 1 View  Result Date: 12/14/2020 CLINICAL DATA:  Shortness of breath EXAM: PORTABLE CHEST 1 VIEW COMPARISON:  None. FINDINGS: The heart size and mediastinal contours are within normal limits. Aortic knob calcifications are seen. Both lungs are clear. The visualized skeletal structures are unremarkable. IMPRESSION: No active disease. Electronically Signed   By: Prudencio Pair M.D.   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  PLAN:    1.  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  Of note, patient had a CT of the abdomen and pelvis on August 19, 2020 did not reveal any metastatic lesions.  CEA and CA 19-9 are elevated, but this is likely secondary to liver metastasis and not a second primary.  Will arrange for ultrasound-guided liver biopsy as outpatient to confirm diagnosis.  Patient will also require PET scan and MRI of the brain to complete the staging work-up. 2. 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 She completed XRT in approximately January 2015. Patient could not tolerate letrozole, anastrozole, or Aromasin.  Her most recent mammogram on July 13, 2020 reported as BI-RADS 1. 3.  Abdominal pain: Continue Norco as prescribed. 4.  Anticoagulation: Aspirin has been discontinued.  Eliquis will need to be held 3 days prior to biopsy and then can be reinitiated once biopsy is completed.  Appreciate cardiology input.   Appreciate consult, will follow.  Lloyd Huger,  MD   12/17/2020 7:05 AM

## 2020-12-17 NOTE — Progress Notes (Signed)
[place] to be D/C'd Home per MD order.  Discussed prescriptions and follow up appointments with the patient. Prescriptions given to patient, medication list explained in detail. Pt verbalized understanding.  Allergies as of 12/17/2020      Reactions   Avelox [moxifloxacin] Other (See Comments)   debilitating muscle pain   Oxycodone    Other reaction(s): Hallucination   Donepezil Other (See Comments)   Patient has severe nightmares    Amoxicillin Other (See Comments)   Yeast infection - tolerated cefazolin before DID THE REACTION INVOLVE: Swelling of the face/tongue/throat, SOB, or low BP? No Sudden or severe rash/hives, skin peeling, or the inside of the mouth or nose? No Did it require medical treatment? No When did it last happen?within the past 10 years If all above answers are "NO", may proceed with cephalosporin use.   Benazepril Cough   Bupropion    Dizziness, Headache   Byetta 10 Mcg Pen [exenatide] Diarrhea   Isosorbide Swelling   Headaches, issues with balance   Metformin Diarrhea   Neurontin [gabapentin] Other (See Comments)   Mouth blisters, joint pain, depression   Pantoprazole     Abdominal Pain   Sulfa Antibiotics Other (See Comments)   Headache   Ceclor [cefaclor] Rash   Tolerated cefazolin before.      Medication List    STOP taking these medications   aspirin EC 81 MG tablet   ibuprofen 200 MG tablet Commonly known as: ADVIL   ondansetron 4 MG tablet Commonly known as: ZOFRAN     TAKE these medications   acidophilus Caps capsule Take 1 capsule by mouth 2 (two) times daily for 14 days.   calcium carbonate 500 MG chewable tablet Commonly known as: TUMS - dosed in mg elemental calcium Chew 1,500 mg by mouth daily as needed for indigestion or heartburn.   cephALEXin 500 MG capsule Commonly known as: KEFLEX Take 1 capsule (500 mg total) by mouth 4 (four) times daily for 12 days.   cyanocobalamin 1000 MCG tablet Take 1,000 mcg by  mouth daily.   Eliquis 5 MG Tabs tablet Generic drug: apixaban Take 5 mg by mouth 2 (two) times daily.   fexofenadine 180 MG tablet Commonly known as: ALLEGRA Take 180 mg by mouth daily as needed for allergies.   insulin aspart 100 UNIT/ML injection Commonly known as: novoLOG Inject 3 Units into the skin 3 (three) times daily with meals.   isosorbide mononitrate 30 MG 24 hr tablet Commonly known as: IMDUR Take 30 mg by mouth every morning.   losartan 50 MG tablet Commonly known as: COZAAR Take 50 mg by mouth every morning.   LUBRICATING EYE DROPS OP Place 1 drop into both eyes daily as needed (dry eyes).   metoprolol succinate 50 MG 24 hr tablet Commonly known as: TOPROL-XL Take 50 mg by mouth every morning. Take with or immediately following a meal.   nitroGLYCERIN 0.4 MG SL tablet Commonly known as: NITROSTAT Place 1 tablet (0.4 mg total) under the tongue every 5 (five) minutes as needed for chest pain.   nystatin powder Commonly known as: MYCOSTATIN/NYSTOP Apply topically 2 (two) times daily.   ondansetron 4 MG disintegrating tablet Commonly known as: Zofran ODT Take 1 tablet (4 mg total) by mouth every 8 (eight) hours as needed for nausea or vomiting.   OneTouch Verio test strip Generic drug: glucose blood 1 each 3 (three) times daily.   oxyCODONE 5 MG immediate release tablet Commonly known as: Oxy IR/ROXICODONE  Take 1-2 tablets (5-10 mg total) by mouth every 4 (four) hours as needed for moderate pain.   pantoprazole 20 MG tablet Commonly known as: PROTONIX Take 20 mg by mouth every morning.   PARoxetine 20 MG tablet Commonly known as: PAXIL Take 20 mg by mouth every morning.   pregabalin 75 MG capsule Commonly known as: LYRICA Take 1 capsule (75 mg total) by mouth 2 (two) times daily.   Semaglutide(0.25 or 0.5MG /DOS) 2 MG/1.5ML Sopn Inject 1 mg as directed every Sunday.   traMADol 50 MG tablet Commonly known as: ULTRAM Take 1 tablet (50 mg  total) by mouth every 6 (six) hours as needed for moderate pain.   Tresiba FlexTouch 200 UNIT/ML FlexTouch Pen Generic drug: insulin degludec Inject 66 Units into the skin at bedtime.   valACYclovir 500 MG tablet Commonly known as: VALTREX Take 500 mg by mouth 2 (two) times daily as needed (fever blisters).   Vitamin D3 25 MCG (1000 UT) Caps Take 1,000 Units by mouth daily.            Durable Medical Equipment  (From admission, onward)         Start     Ordered   12/16/20 1528  For home use only DME oxygen  Once       Question Answer Comment  Length of Need 12 Months   Mode or (Route) Nasal cannula   Liters per Minute 2   Frequency Continuous (stationary and portable oxygen unit needed)   Oxygen conserving device Yes      04 /08/22 1527          Vitals:   12/17/20 0819 12/17/20 1413  BP: (!) 127/101 (!) 156/63  Pulse: 90 82  Resp: 17 18  Temp: 97.9 F (36.6 C) 97.7 F (36.5 C)  SpO2: 94% 95%    Skin clean, dry and intact without evidence of skin break down, no evidence of skin tears noted. IV catheter discontinued intact. Site without signs and symptoms of complications. Dressing and pressure applied. Pt denies pain at this time. No complaints noted.  An After Visit Summary was printed and given to the patient. Patient escorted via [place], and D/C home via private auto.  Fuller Mandril, RN

## 2020-12-17 NOTE — Plan of Care (Signed)
  Problem: Clinical Measurements: Goal: Respiratory complications will improve Outcome: Progressing   Problem: Activity: Goal: Risk for activity intolerance will decrease Outcome: Progressing   Problem: Nutrition: Goal: Adequate nutrition will be maintained Outcome: Progressing   Problem: Pain Managment: Goal: General experience of comfort will improve Outcome: Progressing   Problem: Safety: Goal: Ability to remain free from injury will improve Outcome: Progressing

## 2020-12-19 ENCOUNTER — Telehealth: Payer: Self-pay

## 2020-12-19 ENCOUNTER — Other Ambulatory Visit: Payer: Self-pay

## 2020-12-19 DIAGNOSIS — C787 Secondary malignant neoplasm of liver and intrahepatic bile duct: Secondary | ICD-10-CM

## 2020-12-19 DIAGNOSIS — R918 Other nonspecific abnormal finding of lung field: Secondary | ICD-10-CM

## 2020-12-19 DIAGNOSIS — Z853 Personal history of malignant neoplasm of breast: Secondary | ICD-10-CM

## 2020-12-19 NOTE — Telephone Encounter (Signed)
Called and spoke with Tammy Hatfield regarding her upcoming biopsy and follow up. Liver biopsy is scheduled for 4/18 at 1030. She [place]30 [place] [facility] medical mall entrance and go to registration desk. Instructed to take her Eliquis on 4/15 then hold it until instructed by radiology to resume. Instructed to have a driver the day of biopsy. She will be called by specials RN prior to procedure for any further instructions. Reviewed follow up appointment with Dr. Grayland Ormond for results on 4/26 at 1015. She was discharged on oxygen and had questions regarding when and length of time to wear it. She will see her PCP tomorrow and they should advise regarding oxygen. They inquired regarding additional imaging. They were told she would likely get additional imaging and asked if this could be done prior to her follow up on the 26th. Message sent to Dr. Shari Heritage and his team regarding.

## 2020-12-20 NOTE — Progress Notes (Signed)
Reviewed noted dated 12/19/2020 from RN with [facility].  Attempted to call patient at home/mobile number [phone], and no answer but went to voice mail and left brief message with name/department calling Specials/and phone number for her to return call.

## 2020-12-20 NOTE — Progress Notes (Signed)
Attempted to call patient at [phone] and while leaving voice message patient called back from different number and when started to go over the instructions, call disconnected and was unable to get her back on the telephone.  I was able to instruct patient to stop Eliquis after does on Friday and to not take it Saturday/Sunday/Monday for Monday procedure and patient stated she thought she was to stop it Sunday and I stated to stop it after does on Friday and then call disconnected.  Still need to call her and go through the patient instructions list.  Will discuss with team in AM.

## 2020-12-22 ENCOUNTER — Encounter: Payer: Self-pay | Admitting: *Deleted

## 2020-12-22 NOTE — Progress Notes (Signed)
Informed patient of her appointment for her PET scan at 8:30 on 01/02/21.  She was informed to eat a low carb diet the evening before and nofood 4 hours prior and no sugars.

## 2020-12-23 ENCOUNTER — Other Ambulatory Visit: Payer: Self-pay | Admitting: Student

## 2020-12-26 ENCOUNTER — Ambulatory Visit
Admission: RE | Admit: 2020-12-26 | Discharge: 2020-12-26 | Disposition: A | Discharge status: Home / Self Care | Payer: Medicare HMO | Source: Ambulatory Visit | Attending: Oncology | Admitting: Oncology

## 2020-12-26 ENCOUNTER — Other Ambulatory Visit: Payer: Self-pay

## 2020-12-26 DIAGNOSIS — Z87891 Personal history of nicotine dependence: Secondary | ICD-10-CM | POA: Diagnosis not present

## 2020-12-26 DIAGNOSIS — Z794 Long term (current) use of insulin: Secondary | ICD-10-CM | POA: Insufficient documentation

## 2020-12-26 DIAGNOSIS — Z923 Personal history of irradiation: Secondary | ICD-10-CM | POA: Insufficient documentation

## 2020-12-26 DIAGNOSIS — Z853 Personal history of malignant neoplasm of breast: Secondary | ICD-10-CM | POA: Insufficient documentation

## 2020-12-26 DIAGNOSIS — C7A1 Malignant poorly differentiated neuroendocrine tumors: Secondary | ICD-10-CM | POA: Insufficient documentation

## 2020-12-26 DIAGNOSIS — E119 Type 2 diabetes mellitus without complications: Secondary | ICD-10-CM | POA: Insufficient documentation

## 2020-12-26 DIAGNOSIS — C787 Secondary malignant neoplasm of liver and intrahepatic bile duct: Secondary | ICD-10-CM | POA: Diagnosis not present

## 2020-12-26 LAB — CBC
HCT: 39.6 % (ref 36.0–46.0)
Hemoglobin: 12.8 g/dL (ref 12.0–15.0)
MCH: 29.4 pg (ref 26.0–34.0)
MCHC: 32.3 g/dL (ref 30.0–36.0)
MCV: 91 fL (ref 80.0–100.0)
Platelets: 197 10*3/uL (ref 150–400)
RBC: 4.35 MIL/uL (ref 3.87–5.11)
RDW: 13.8 % (ref 11.5–15.5)
WBC: 7.9 10*3/uL (ref 4.0–10.5)
nRBC: 0 % (ref 0.0–0.2)

## 2020-12-26 LAB — GLUCOSE, CAPILLARY
Glucose-Capillary: 102 mg/dL — ABNORMAL HIGH (ref 70–99)
Glucose-Capillary: 100 mg/dL — ABNORMAL HIGH (ref 70–99)

## 2020-12-26 LAB — PROTIME-INR
INR: 1 (ref 0.8–1.2)
Prothrombin Time: 13.2 seconds (ref 11.4–15.2)

## 2020-12-26 MED ORDER — FENTANYL CITRATE (PF) 100 MCG/2ML IJ SOLN
INTRAMUSCULAR | Status: AC | PRN
  Administered 2020-12-26 (×2): 50 ug via INTRAVENOUS

## 2020-12-26 MED ORDER — MIDAZOLAM HCL 2 MG/2ML IJ SOLN
INTRAMUSCULAR | Status: AC | PRN
  Administered 2020-12-26: 1 mg via INTRAVENOUS

## 2020-12-26 MED ORDER — SODIUM CHLORIDE 0.9 % IV SOLN: INTRAVENOUS | Status: DC

## 2020-12-26 MED ORDER — FENTANYL CITRATE (PF) 100 MCG/2ML IJ SOLN
INTRAMUSCULAR | Status: AC
  Filled 2020-12-26: qty 2

## 2020-12-26 MED ORDER — MIDAZOLAM HCL 2 MG/2ML IJ SOLN
INTRAMUSCULAR | Status: AC
  Filled 2020-12-26: qty 2

## 2020-12-26 NOTE — Procedures (Signed)
Interventional Radiology Procedure Note  Procedure: [place] LEFT LIVER MET CORE BX    Complications: None  Estimated Blood Loss:  MIN  Findings: 18 G CORE X 3    M. Daryll Brod, MD

## 2020-12-26 NOTE — H&P (Signed)
Chief Complaint:  Liver metastases by imaging, here for biopsy  Referring Physician(s): Finnegan,Timothy J   History of Present Illness: Tammy Hatfield is a 75 y.o. female with a remote history of stage I breast cancer now presenting to the emergency department with abdominal pain.  Imaging revealed innumerable liver metastases without clear primary in the abdomen or pelvis.  Chest CT imaging demonstrates a right upper lobe mass with right hilar adenopathy concerning for lung primary.  Patient presents today for ultrasound liver metastasis biopsy.  Overall she is at her usual state of health.  No recent illness or fevers.  Stable weight and appetite.  No chest pain or abdominal pain.  No shortness of breath or hemoptysis.  Negative for cough or upper respiratory symptoms.  Past Medical History:  Diagnosis Date  . Anginal pain ([place])   . Anxiety   . Arthritis    knees  . Barrett's esophagus   . Bleeds easily ([place])   . Breast cancer ([place]) 2014   RT LUMPECTOMY  . Bruises easily   . Cancer [facility]) 2014   right breast - s\p Radiation and lumpectomy and LN dissection  . Chronic sinusitis   . Complication of anesthesia    hard to wake after Fentanyl  . COPD (chronic obstructive pulmonary disease) ([place]) 2012  . Coronary artery disease    4 STENTS  . Degenerative lumbar disc   . Diabetes mellitus without complication ([place])   . Dysphagia   . Dyspnea    WITH EXERTION  . Esophageal reflux   . Fatty liver   . History of Helicobacter pylori infection   . History of hiatal hernia   . Hypercholesteremia   . Hyperlipidemia   . Hypertension   . Personal history of radiation therapy   . Pneumonia   . Radiation 2014   RT BREAST CA  . Sleep apnea    NO CPAP-HAD A UPPP  . Ulcer, stomach peptic    history of  . Vertigo    last episode 2017(approx)    Past Surgical History:  Procedure Laterality Date  . ABDOMINAL HYSTERECTOMY    . BREAST BIOPSY Right 1985   NEG  . BREAST  EXCISIONAL BIOPSY Right 2014   [facility] and [facility]  . BREAST LUMPECTOMY Right 2014  . BUNIONECTOMY Bilateral 1980  . CATARACT EXTRACTION W/PHACO Left 04/01/2018   Procedure: CATARACT EXTRACTION PHACO AND INTRAOCULAR LENS PLACEMENT ([place]) LEFT DIABETES IVA TOPICAL;  Surgeon: [name], MD;  Location: [place];  Service: Ophthalmology;  Laterality: Left;  Diabetic - insulin and oral meds sleep apnea  . CATARACT EXTRACTION W/PHACO Right 04/30/2018   Procedure: CATARACT EXTRACTION PHACO AND INTRAOCULAR LENS PLACEMENT ([place]) RIGHT DIABETIC;  Surgeon: [name], MD;  Location: [place];  Service: Ophthalmology;  Laterality: Right;  Diabetic - insulin sleep apnea  . CHOLECYSTECTOMY    . COLONOSCOPY N/A 07/12/2020   Procedure: COLONOSCOPY;  Surgeon: [name], MD;  Location: [facility] ENDOSCOPY;  Service: Endoscopy;  Laterality: N/A;  . CORONARY ANGIOPLASTY    . CORONARY STENT INTERVENTION N/A 07/05/2017   Procedure: CORONARY STENT INTERVENTION;  Surgeon: [name], MD;  Location: [place] CV LAB;  Service: Cardiovascular;  Laterality: N/A;  . ESOPHAGOGASTRODUODENOSCOPY N/A 07/12/2020   Procedure: ESOPHAGOGASTRODUODENOSCOPY (EGD);  Surgeon: [name], MD;  Location: [facility] ENDOSCOPY;  Service: Endoscopy;  Laterality: N/A;  . LAPAROSCOPIC TUBAL LIGATION Bilateral 1967  . LEFT HEART CATH AND CORONARY ANGIOGRAPHY N/A 07/05/2017  Procedure: LEFT HEART CATH AND CORONARY ANGIOGRAPHY;  Surgeon: [name], MD;  Location: [place] CV LAB;  Service: Cardiovascular;  Laterality: N/A;  . LEFT HEART CATH AND CORONARY ANGIOGRAPHY Left 09/18/2018   Procedure: Left Heart Cath w/ Coronary Angiography;  Surgeon: [name], MD;  Location: [place] CV LAB;  Service: Cardiovascular;  Laterality: Left;  . LEFT HEART CATH AND CORONARY ANGIOGRAPHY Left 02/25/2019   Procedure: LEFT HEART CATH AND CORONARY ANGIOGRAPHY;  Surgeon: Isaias Cowman, MD;  Location: [place] CV LAB;  Service: Cardiovascular;  Laterality: Left;  . SHOULDER ARTHROSCOPY WITH ROTATOR CUFF REPAIR Right 02/09/2015   Procedure: SHOULDER ARTHROSCOPY WITH ROTATOR CUFF REPAIR,release long head biceps tendon,subacromial decompression.;  Surgeon: Leanor Kail, MD;  Location: [facility] ORS;  Service: Orthopedics;  Laterality: Right;  . TOTAL KNEE ARTHROPLASTY Left 07/21/2020   Procedure: TOTAL KNEE ARTHROPLASTY;  Surgeon: Corky Mull, MD;  Location: [facility] ORS;  Service: Orthopedics;  Laterality: Left;  . UMBILICAL HERNIA REPAIR N/A   . UVULOPALATOPHARYNGOPLASTY      Allergies: Avelox [moxifloxacin], Oxycodone, Donepezil, Amoxicillin, Benazepril, Bupropion, Byetta 10 mcg pen [exenatide], Isosorbide, Metformin, Neurontin [gabapentin], Pantoprazole, Sulfa antibiotics, and Ceclor [cefaclor]  Medications: Prior to Admission medications   Medication Sig Start Date End Date Taking? Authorizing Provider  calcium carbonate (TUMS - DOSED IN MG ELEMENTAL CALCIUM) 500 MG chewable tablet Chew 1,500 mg by mouth daily as needed for indigestion or heartburn.   Yes [provider]  Carboxymethylcellul-Glycerin (LUBRICATING EYE DROPS OP) Place 1 drop into both eyes daily as needed (dry eyes).   Yes [provider]  cephALEXin (KEFLEX) 500 MG capsule Take 1 capsule (500 mg total) by mouth 4 (four) times daily for 12 days. 12/17/20 12/29/20 Yes Nolberto Hanlon, MD  Cholecalciferol (VITAMIN D3) 25 MCG (1000 UT) CAPS Take 1,000 Units by mouth daily.   Yes [provider]  cyanocobalamin 1000 MCG tablet Take 1,000 mcg by mouth daily.   Yes [provider]  fexofenadine (ALLEGRA) 180 MG tablet Take 180 mg by mouth daily as needed for allergies.   Yes [provider]  insulin degludec (TRESIBA FLEXTOUCH) 200 UNIT/ML FlexTouch Pen Inject 66 Units into the skin at bedtime.  01/26/20  Yes [provider]  losartan (COZAAR) 50 MG tablet  Take 50 mg by mouth every morning.    Yes [provider]  metoprolol succinate (TOPROL-XL) 50 MG 24 hr tablet Take 50 mg by mouth every morning. Take with or immediately following a meal.   Yes [provider]  nystatin (MYCOSTATIN/NYSTOP) powder Apply topically 2 (two) times daily. 12/17/20  Yes Nolberto Hanlon, MD  pantoprazole (PROTONIX) 20 MG tablet Take 20 mg by mouth every morning.    Yes [provider]  PARoxetine (PAXIL) 20 MG tablet Take 20 mg by mouth every morning.    Yes [provider]  pregabalin (LYRICA) 75 MG capsule Take 1 capsule (75 mg total) by mouth 2 (two) times daily. 07/25/20  Yes Lattie Corns, PA-C  Semaglutide,0.25 or 0.5MG /DOS, 2 MG/1.5ML SOPN Inject 1 mg as directed every Sunday.  01/26/20  Yes [provider]  traMADol (ULTRAM) 50 MG tablet Take 1 tablet (50 mg total) by mouth every 6 (six) hours as needed for moderate pain. 07/25/20  Yes Lattie Corns, PA-C  acidophilus (RISAQUAD) CAPS capsule Take 1 capsule by mouth 2 (two) times daily for 14 days. Patient not taking: Reported on 12/26/2020 12/17/20 12/31/20  Nolberto Hanlon, MD  ELIQUIS 5 MG TABS tablet Take 5 mg by mouth 2 (two) times daily. 09/13/20   [provider]  insulin aspart (NOVOLOG) 100 UNIT/ML injection Inject 3 Units into the skin 3 (three) times daily with meals. Patient not taking: No sig reported 07/26/20   Lattie Corns, PA-C  nitroGLYCERIN (NITROSTAT) 0.4 MG SL tablet Place 1 tablet (0.4 mg total) under the tongue every 5 (five) minutes as needed for chest pain. 07/06/17   Bettey Costa, MD  ondansetron (ZOFRAN ODT) 4 MG disintegrating tablet Take 1 tablet (4 mg total) by mouth every 8 (eight) hours as needed for nausea or vomiting. Patient not taking: No sig reported 08/19/20   Carrie Mew, MD  [facility] VERIO test strip 1 each 3 (three) times daily. 12/22/19   [provider]  oxyCODONE (OXY IR/ROXICODONE) 5 MG immediate  release tablet Take 1-2 tablets (5-10 mg total) by mouth every 4 (four) hours as needed for moderate pain. Patient not taking: No sig reported 07/25/20   Lattie Corns, PA-C  valACYclovir (VALTREX) 500 MG tablet Take 500 mg by mouth 2 (two) times daily as needed (fever blisters).  Patient not taking: Reported on 12/26/2020 02/22/17   [provider]     Family History  Problem Relation Age of Onset  . Breast cancer Neg Hx     Social History   Socioeconomic History  . Marital status: Widowed    Spouse name: Not on file  . Number of children: Not on file  . Years of education: Not on file  . Highest education level: Not on file  Occupational History  . Not on file  Tobacco Use  . Smoking status: Former Smoker    Packs/day: 2.00    Years: 30.00    Pack years: 60.00    Quit date: 02/01/2000    Years since quitting: 20.9  . Smokeless tobacco: Never Used  . Tobacco comment: 07/18/2017 former smoker quit 1999  Vaping Use  . Vaping Use: Never used  Substance and Sexual Activity  . Alcohol use: No    Alcohol/week: 0.0 standard drinks    Comment: may have drink on Holidays  . Drug use: No  . Sexual activity: Yes    Birth control/protection: Post-menopausal  Other Topics Concern  . Not on file  Social History Narrative  . Not on file   Social Determinants of Health   Financial Resource Strain: Not on file  Food Insecurity: Not on file  Transportation Needs: Not on file  Physical Activity: Not on file  Stress: Not on file  Social Connections: Not on file     Review of Systems: A 12 point ROS discussed and pertinent positives are indicated in the HPI above.  All other systems are negative.  Review of Systems  Vital Signs: BP (!) 135/52   Pulse 88   Temp 98.9 F (37.2 C) (Oral)   Resp (!) 22   Ht 5\' 4"  (1.626 m)   Wt 83 kg   SpO2 94%   BMI 31.41 kg/m   Physical Exam Constitutional:      General: She is not in acute distress.    Appearance: She is  not ill-appearing.     Comments: Elderly overweight female in bed  Eyes:     General: No scleral icterus.    Conjunctiva/sclera: Conjunctivae normal.  Cardiovascular:     Rate and Rhythm: Normal rate and regular rhythm.     Heart sounds: Normal heart sounds.  Pulmonary:  Effort: Pulmonary effort is normal.     Breath sounds: Normal breath sounds.  Abdominal:     General: Bowel sounds are normal.     Palpations: Abdomen is soft.     Tenderness: There is no abdominal tenderness.  Skin:    Coloration: Skin is not jaundiced.  Neurological:     General: No focal deficit present.     Mental Status: Mental status is at baseline.  Psychiatric:        Mood and Affect: Mood normal.     Imaging: CT CHEST WO CONTRAST  Result Date: 12/15/2020 CLINICAL DATA:  Abdominal pain, yesterday CT abdomen innumerable new hepatic metastatic lesions. Staging workup, cancer of unknown primary. Patient does have a history of breast cancer and lumpectomy in 2015. EXAM: CT CHEST WITHOUT CONTRAST TECHNIQUE: Multidetector CT imaging of the chest was performed following the standard protocol without IV contrast. COMPARISON:  CT abdomen 12/14/2020 FINDINGS: Cardiovascular: Coronary, aortic arch, and branch vessel atherosclerotic vascular disease. Mediastinum/Nodes: There is pathologic prevascular and right paratracheal adenopathy. Index right paratracheal node 2.3 cm in short axis on image 50 series 2. Lungs/Pleura: 5.0 by 3.3 by 3.9 cm right apical lung mass with peribronchovascular nodularity extending from this mass down towards the right hilum. Interstitial irregularity and thickening adjacent to the mass suspicious for lymphangitic tumor spread. Small satellite lesions including a 0.6 by 0.6 cm right upper lobe lesion on image 39 series 3. I not see well-defined chest wall invasion or rib destruction at this time. Prior radiation port anteriorly in the right upper lobe and right middle lobe. 3 mm likely calcified  right middle lobe pulmonary nodule on image 94 series 3. Centrilobular emphysema. 3 by 4 mm lingular nodule. Bandlike scarring or atelectasis in the left lower lobe posterior basal segment. Upper Abdomen: Nodular liver contour from cirrhosis or pseudo cirrhosis. Musculoskeletal: Thoracic spondylosis. Other: 2.6 by 1.6 cm focus of architectural distortion medially in the right breast on image 67 series 2. While the patient had a previous lumpectomy in the medial breast, this seems to me to be substantially more medial than the reported lumpectomy tumor, and also closer to the pectoralis muscle. IMPRESSION: 1. 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 Pathologic mediastinal adenopathy. 2. Nodular liver, shown to be due to multiple masses on recent abdominal CT. 3. Soft tissue mass far medially in the right breast anterior to the right edge of the sternum. Although the patient does have a history of lumpectomy and lumpectomy scar sites can appear similar to this lesion, the lesion seems to me to be substantially further medial to the site of prior breast mass and as such could represent new tumor or recurrence. Once no longer acutely ill and on an outpatient basis, nuclear medicine PET-CT may be helpful in further assessment and further staging workup. 4. Other imaging findings of potential clinical significance: Aortic Atherosclerosis (ICD10-I70.0) and Emphysema (ICD10-J43.9). Coronary atherosclerosis. Electronically Signed   By: Van Clines M.D.   On: 12/15/2020 16:07   CT ABDOMEN PELVIS W CONTRAST  Result Date: 12/14/2020 CLINICAL DATA:  76 year old female with abdominal pain. EXAM: CT ABDOMEN AND PELVIS WITH CONTRAST TECHNIQUE: Multidetector CT imaging of the abdomen and pelvis was performed using the standard protocol following bolus administration of intravenous contrast. CONTRAST:  134mL OMNIPAQUE IOHEXOL 300 MG/ML  SOLN COMPARISON:   CT abdomen pelvis dated 08/16/2020. FINDINGS: Lower chest: The visualized lung bases are clear. Coronary vascular  calcifications noted. No intra-abdominal free air or free fluid. Hepatobiliary: Innumerable hypoenhancing lesions in the liver measuring up to 5 cm, new since the prior CT consistent with metastatic disease. There is irregularity and nodularity of the liver contour which may represent cirrhosis or pseudo cirrhosis. No intrahepatic biliary ductal dilatation. Cholecystectomy. No retained calcified stone noted in the central CBD. Pancreas: Unremarkable. No pancreatic ductal dilatation or surrounding inflammatory changes. Spleen: Normal in size without focal abnormality. Adrenals/Urinary Tract: The adrenal glands unremarkable. There is no hydronephrosis on either side. There is apparent faint heterogeneous nephrogram in the inferior pole of the right kidney. Correlation with urinalysis recommended to exclude pyelonephritis. No drainable fluid collection or abscess. The visualized ureters and urinary bladder appear unremarkable. Stomach/Bowel: There is sigmoid diverticulosis and scattered colonic diverticula without active inflammatory changes. There is no bowel obstruction or active inflammation. The appendix is normal. Vascular/Lymphatic: Advanced aortoiliac atherosclerotic disease. The IVC is unremarkable. No portal venous gas. There is no adenopathy. Reproductive: Hysterectomy.  No adnexal masses. Other: None Musculoskeletal: Degenerative changes of the spine. No acute osseous pathology. IMPRESSION: 1. Innumerable hepatic metastatic disease, new since the prior CT. 2. Colonic diverticulosis. No bowel obstruction. Normal appendix. 3. Faint heterogeneous nephrogram in the inferior pole of the right kidney. Correlation with urinalysis recommended to exclude pyelonephritis. No drainable fluid collection or abscess. 4. Aortic Atherosclerosis (ICD10-I70.0). Electronically Signed   By: Anner Crete M.D.    On: 12/14/2020 22:57   DG Chest Portable 1 View  Result Date: 12/14/2020 CLINICAL DATA:  Shortness of breath EXAM: PORTABLE CHEST 1 VIEW COMPARISON:  None. FINDINGS: The heart size and mediastinal contours are within normal limits. Aortic knob calcifications are seen. Both lungs are clear. The visualized skeletal structures are unremarkable. IMPRESSION: No active disease. Electronically Signed   By: Prudencio Pair M.D.   On: 12/14/2020 22:25    Labs:  CBC: Recent Labs    08/19/20 1230 12/14/20 1931 12/15/20 0841 12/16/20 0514  WBC 7.6 8.8 6.4 5.7  HGB 13.4 12.2 12.2 11.5*  HCT 40.5 37.8 37.4 35.3*  PLT 294 208 175 171    COAGS: No results for input(s): INR, APTT in the last 8760 hours.  BMP: Recent Labs    08/19/20 1230 12/14/20 1931 12/15/20 0841 12/16/20 0514  NA 134* 138 138 136  K 5.0 4.4 4.0 4.3  CL 96* 102 99 99  CO2 27 26 27 28   GLUCOSE 210* 94 91 116*  BUN 20 15 18 18   CALCIUM 9.7 9.3 9.0 9.0  CREATININE 1.05* 0.96 1.13* 0.87  GFRNONAA 56* >60 51* >60    LIVER FUNCTION TESTS: Recent Labs    07/13/20 1434 08/19/20 1230 12/14/20 1931 12/16/20 0514  BILITOT 0.6 0.8 1.1 0.6  AST 17 14* 84* 65*  ALT 12 10 70* 52*  ALKPHOS 84 113 344* 298*  PROT 6.8 7.3 6.9 6.2*  ALBUMIN 3.9 3.8 3.8 3.1*    TUMOR MARKERS: No results for input(s): AFPTM, CEA, CA199, CHROMGRNA in the last 8760 hours.  Assessment and Plan:  Liver metastases by imaging, lung primary suspected.  Plan for US liver metastasis biopsy today.  Risks and benefits of [place] BX was discussed with the patient and/or patient's family including, but not limited to bleeding, infection, damage to adjacent structures or low yield requiring additional tests.  All of the questions were answered and there is agreement to proceed.  Consent signed and in chart.    Thank you for this interesting consult.  I  greatly enjoyed meeting Tammy Hatfield and look forward to participating in their care.  A copy  of this report was sent to the requesting provider on this date.  Electronically Signed: Greggory Keen, MD 12/26/2020, 10:07 AM   I spent a total of  30 Minutes   in face to face in clinical consultation, greater than 50% of which was counseling/coordinating care for WITH NEW LIVER METS.

## 2020-12-26 NOTE — Progress Notes (Signed)
Called phone number patient gave for friend pick-up and was wrong friend.  Patient called her son and he provided correct phone number.  Tammy Hatfield at [phone] and left voice message and he promptly returned call and I spoke with him via telephone and gave instructions to pick-up patient at 2:00pm at  [facility] patient drop-off and he stated he knew where this location was as he dropped her off this morning.  Updated team.

## 2020-12-26 NOTE — Progress Notes (Signed)
Neighbor Jenny Reichmann Fl[place] will be picking patient up when ready for discharge-[phone].  Patient states for care instructions to call daughter-in-law Amazin Pincock at [phone].

## 2020-12-26 NOTE — Progress Notes (Signed)
Patient clinically stable post Liver Biopsy per Dr Brown Human well. Denies complaints post procedure. Received Versed1 mg along with Fentanyl 100 mcg IV for procedure. Vitals stable pre and post procedure. Awake/alert and oriented post procedure. bandade dry/intact post procedure. Report given to [facility] post procedure/specials.

## 2020-12-28 ENCOUNTER — Other Ambulatory Visit: Payer: Self-pay | Admitting: Anatomic Pathology & Clinical Pathology

## 2020-12-28 LAB — SURGICAL PATHOLOGY

## 2020-12-30 ENCOUNTER — Other Ambulatory Visit: Payer: Self-pay | Admitting: Oncology

## 2020-12-30 DIAGNOSIS — C7A8 Other malignant neuroendocrine tumors: Secondary | ICD-10-CM | POA: Insufficient documentation

## 2020-12-30 DIAGNOSIS — C3490 Malignant neoplasm of unspecified part of unspecified bronchus or lung: Secondary | ICD-10-CM

## 2020-12-30 NOTE — Progress Notes (Signed)
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  INTERVAL HISTORY: Patient returns to clinic today for further evaluation and discussion of her imaging and biopsy results.  She reports a significant decline in her performance status over the past 4 to 6 weeks.  She has increased back pain as well as abdominal bloating.  She also has significant weakness and fatigue.  She has no neurologic complaints. She denies any recent fevers or illnesses.  S she has a poor appetite.  She denies any chest pain, shortness of breath, cough, or hemoptysis.  She denies any nausea, vomiting, constipation, or diarrhea.  She has no urinary complaints.  Patient feels generally terrible, but offers no further specific complaints today.  REVIEW OF SYSTEMS:   Review of Systems  Constitutional: Positive for malaise/fatigue. Negative for fever and weight loss.  Respiratory: Negative.  Negative for sputum production.   Cardiovascular: Negative.  Negative for chest pain and leg swelling.  Gastrointestinal: Positive for abdominal pain and nausea.  Genitourinary: Negative.  Negative for dysuria.  Musculoskeletal: Positive for back pain.  Skin: Negative.  Negative for rash.  Neurological: Positive for weakness. Negative for sensory change and focal weakness.  Psychiatric/Behavioral: Negative.  The patient is not nervous/anxious.     As per HPI. Otherwise, a  complete review of systems is negative.  PAST MEDICAL HISTORY: Past Medical History:  Diagnosis Date  . Anginal pain ([place])   . Anxiety   . Arthritis    knees  . Barrett's esophagus   . Bleeds easily ([place])   . Breast cancer ([place]) 2014   RT LUMPECTOMY  . Bruises easily   . Cancer [facility]) 2014   right breast - s\p Radiation and lumpectomy and LN dissection  . Chronic sinusitis   . Complication of anesthesia    hard to wake after Fentanyl  . COPD (chronic obstructive pulmonary disease) ([place]) 2012  . Coronary artery disease    4 STENTS  . Degenerative lumbar disc   . Diabetes mellitus without complication ([place])   . Dysphagia   . Dyspnea    WITH EXERTION  . Esophageal reflux   . Fatty liver   . History of Helicobacter pylori infection   . History of hiatal hernia   . Hypercholesteremia   . Hyperlipidemia   . Hypertension   . Personal history of radiation therapy   . Pneumonia   . Radiation 2014   RT BREAST CA  . Sleep apnea    NO CPAP-HAD A UPPP  . Ulcer, stomach peptic    history of  . Vertigo    last episode 2017(approx)    PAST SURGICAL HISTORY: Past Surgical History:  Procedure Laterality Date  . ABDOMINAL HYSTERECTOMY    . BREAST BIOPSY Right 1985   NEG  . BREAST EXCISIONAL BIOPSY Right 2014   [facility] and DCIS  . BREAST LUMPECTOMY Right 2014  . BUNIONECTOMY Bilateral 1980  . CATARACT EXTRACTION W/PHACO Left 04/01/2018   Procedure: CATARACT EXTRACTION PHACO AND INTRAOCULAR  LENS PLACEMENT (IOC) LEFT DIABETES IVA TOPICAL;  Surgeon: [name], MD;  Location: [place];  Service: Ophthalmology;  Laterality: Left;  Diabetic - insulin and oral meds sleep apnea  . CATARACT EXTRACTION W/PHACO Right 04/30/2018   Procedure: CATARACT EXTRACTION PHACO AND INTRAOCULAR LENS PLACEMENT ([place]) RIGHT DIABETIC;  Surgeon: [name], MD;  Location: [place];  Service: Ophthalmology;  Laterality: Right;  Diabetic - insulin sleep apnea  .  CHOLECYSTECTOMY    . COLONOSCOPY N/A 07/12/2020   Procedure: COLONOSCOPY;  Surgeon: [name], MD;  Location: [facility] ENDOSCOPY;  Service: Endoscopy;  Laterality: N/A;  . CORONARY ANGIOPLASTY    . CORONARY STENT INTERVENTION N/A 07/05/2017   Procedure: CORONARY STENT INTERVENTION;  Surgeon: [name], MD;  Location: [place] CV LAB;  Service: Cardiovascular;  Laterality: N/A;  . ESOPHAGOGASTRODUODENOSCOPY N/A 07/12/2020   Procedure: ESOPHAGOGASTRODUODENOSCOPY (EGD);  Surgeon: [name], MD;  Location: [facility] ENDOSCOPY;  Service: Endoscopy;  Laterality: N/A;  . LAPAROSCOPIC TUBAL LIGATION Bilateral 1967  . LEFT HEART CATH AND CORONARY ANGIOGRAPHY N/A 07/05/2017   Procedure: LEFT HEART CATH AND CORONARY ANGIOGRAPHY;  Surgeon: [name], MD;  Location: [place] CV LAB;  Service: Cardiovascular;  Laterality: N/A;  . LEFT HEART CATH AND CORONARY ANGIOGRAPHY Left 09/18/2018   Procedure: Left Heart Cath w/ Coronary Angiography;  Surgeon: [name], MD;  Location: [place] CV LAB;  Service: Cardiovascular;  Laterality: Left;  . LEFT HEART CATH AND CORONARY ANGIOGRAPHY Left 02/25/2019   Procedure: LEFT HEART CATH AND CORONARY ANGIOGRAPHY;  Surgeon: Isaias Cowman, MD;  Location: [place] CV LAB;  Service: Cardiovascular;  Laterality: Left;  . SHOULDER ARTHROSCOPY WITH ROTATOR CUFF REPAIR Right 02/09/2015   Procedure: SHOULDER ARTHROSCOPY WITH ROTATOR CUFF REPAIR,release long head biceps tendon,subacromial decompression.;  Surgeon: Leanor Kail, MD;  Location: [facility]S;  Service: Orthopedics;  Laterality: Right;  . TOTAL KNEE ARTHROPLASTY Left 07/21/2020   Procedure: TOTAL KNEE ARTHROPLASTY;  Surgeon: Corky Mull, MD;  Location: [facility] ORS;  Service: Orthopedics;  Laterality: Left;  . UMBILICAL HERNIA REPAIR N/A   . UVULOPALATOPHARYNGOPLASTY      FAMILY HISTORY Family History  Problem Relation Age of Onset  . Breast cancer Neg Hx         ADVANCED DIRECTIVES:    HEALTH MAINTENANCE: Social History   Tobacco Use  . Smoking status: Former Smoker    Packs/day: 2.00    Years: 30.00    Pack years: 60.00    Quit date: 02/01/2000    Years since quitting: 20.9  . Smokeless tobacco: Never Used  . Tobacco comment: 07/18/2017 former smoker quit 1999  Vaping Use  . Vaping Use: Never used  Substance Use Topics  . Alcohol use: No    Alcohol/week: 0.0 standard drinks    Comment: may have drink on Holidays  . Drug use: No     Colonoscopy:  PAP:  Bone density:  Lipid panel:  Allergies  Allergen Reactions  . Avelox [Moxifloxacin] Other (See Comments)    debilitating muscle pain  . Oxycodone     Other reaction(s): Hallucination  . Donepezil Other (See Comments)    Patient has severe nightmares   . Amoxicillin Other (See Comments)    Yeast infection - tolerated cefazolin before DID THE REACTION INVOLVE: Swelling of the face/tongue/throat, SOB, or low BP? No Sudden or severe rash/hives, skin peeling, or the inside of the mouth or nose? No Did it require medical treatment? No When did  it last happen?within the past 10 years If all above answers are "NO", may proceed with cephalosporin use.  . Benazepril Cough  . Bupropion     Dizziness, Headache  . Byetta 10 Mcg Pen [Exenatide] Diarrhea  . Isosorbide Swelling    Headaches, issues with balance  . Metformin Diarrhea  . Neurontin [Gabapentin] Other (See Comments)    Mouth blisters, joint pain, depression   . Pantoprazole      Abdominal Pain  . Sulfa Antibiotics Other (See Comments)    Headache  . Ceclor [Cefaclor] Rash    Tolerated cefazolin before.    Current Outpatient Medications  Medication Sig Dispense Refill  . calcium carbonate (TUMS - DOSED IN MG ELEMENTAL CALCIUM) 500 MG chewable tablet Chew 1,500 mg by mouth daily as needed for indigestion or heartburn.    . Carboxymethylcellul-Glycerin (LUBRICATING EYE DROPS OP) Place 1 drop into both  eyes daily as needed (dry eyes).    Marland Kitchen ELIQUIS 5 MG TABS tablet Take 5 mg by mouth 2 (two) times daily.    . fexofenadine (ALLEGRA) 180 MG tablet Take 180 mg by mouth daily as needed for allergies.    Marland Kitchen insulin degludec (TRESIBA FLEXTOUCH) 200 UNIT/ML FlexTouch Pen Inject 66 Units into the skin at bedtime.     Marland Kitchen losartan (COZAAR) 50 MG tablet Take 50 mg by mouth every morning.     . metoprolol succinate (TOPROL-XL) 50 MG 24 hr tablet Take 50 mg by mouth every morning. Take with or immediately following a meal.    . nitroGLYCERIN (NITROSTAT) 0.4 MG SL tablet Place 1 tablet (0.4 mg total) under the tongue every 5 (five) minutes as needed for chest pain. 30 tablet 0  . nystatin (MYCOSTATIN/NYSTOP) powder Apply topically 2 (two) times daily. 15 g 0  . ONETOUCH VERIO test strip 1 each 3 (three) times daily.    . pantoprazole (PROTONIX) 20 MG tablet Take 20 mg by mouth every morning.     Marland Kitchen PARoxetine (PAXIL) 20 MG tablet Take 20 mg by mouth every morning.     . pregabalin (LYRICA) 75 MG capsule Take 1 capsule (75 mg total) by mouth 2 (two) times daily. 20 capsule 0  . Semaglutide,0.25 or 0.5MG/DOS, 2 MG/1.5ML SOPN Inject 1 mg as directed every Sunday.     . traMADol (ULTRAM) 50 MG tablet Take 1 tablet (50 mg total) by mouth every 6 (six) hours as needed for moderate pain. 30 tablet 0  . valACYclovir (VALTREX) 500 MG tablet Take 500 mg by mouth 2 (two) times daily as needed (fever blisters).     No current facility-administered medications for this visit.    OBJECTIVE: Vitals:   01/03/21 1010  BP: (!) 120/49  Pulse: 100  Temp: 98 F (36.7 C)  SpO2: 98%     Body mass index is 30.31 kg/m.    ECOG FS:0 - Asymptomatic  General: Ill-appearing, no acute distress.  No acute distress. Eyes: Pink conjunctiva, anicteric sclera. HEENT: Normocephalic, moist mucous membranes. Lungs: No audible wheezing or coughing. Heart: Regular rate and rhythm. Abdomen: Distended, nontender. Musculoskeletal: No  edema, cyanosis, or clubbing. Neuro: Alert, answering all questions appropriately. Cranial nerves grossly intact. Skin: No rashes or petechiae noted. Psych: Flat affect.  LAB RESULTS:  Lab Results  Component Value Date   NA 136 12/16/2020   K 4.3 12/16/2020   CL 99 12/16/2020   CO2 28 12/16/2020   GLUCOSE 116 (H) 12/16/2020   BUN 18 12/16/2020   CREATININE 0.87  12/16/2020   CALCIUM 9.0 12/16/2020   PROT 6.2 (L) 12/16/2020   ALBUMIN 3.1 (L) 12/16/2020   AST 65 (H) 12/16/2020   ALT 52 (H) 12/16/2020   ALKPHOS 298 (H) 12/16/2020   BILITOT 0.6 12/16/2020   GFRNONAA >60 12/16/2020   GFRAA >60 07/06/2017    Lab Results  Component Value Date   WBC 7.9 12/26/2020   NEUTROABS 4.0 07/13/2020   HGB 12.8 12/26/2020   HCT 39.6 12/26/2020   MCV 91.0 12/26/2020   PLT 197 12/26/2020     STUDIES: CT CHEST WO CONTRAST  Result Date: 12/15/2020 CLINICAL DATA:  Abdominal pain, yesterday CT abdomen innumerable new hepatic metastatic lesions. Staging workup, cancer of unknown primary. Patient does have a history of breast cancer and lumpectomy in 2015. EXAM: CT CHEST WITHOUT CONTRAST TECHNIQUE: Multidetector CT imaging of the chest was performed following the standard protocol without IV contrast. COMPARISON:  CT abdomen 12/14/2020 FINDINGS: Cardiovascular: Coronary, aortic arch, and branch vessel atherosclerotic vascular disease. Mediastinum/Nodes: There is pathologic prevascular and right paratracheal adenopathy. Index right paratracheal node 2.3 cm in short axis on image 50 series 2. Lungs/Pleura: 5.0 by 3.3 by 3.9 cm right apical lung mass with peribronchovascular nodularity extending from this mass down towards the right hilum. Interstitial irregularity and thickening adjacent to the mass suspicious for lymphangitic tumor spread. Small satellite lesions including a 0.6 by 0.6 cm right upper lobe lesion on image 39 series 3. I not see well-defined chest wall invasion or rib destruction at this  time. Prior radiation port anteriorly in the right upper lobe and right middle lobe. 3 mm likely calcified right middle lobe pulmonary nodule on image 94 series 3. Centrilobular emphysema. 3 by 4 mm lingular nodule. Bandlike scarring or atelectasis in the left lower lobe posterior basal segment. Upper Abdomen: Nodular liver contour from cirrhosis or pseudo cirrhosis. Musculoskeletal: Thoracic spondylosis. Other: 2.6 by 1.6 cm focus of architectural distortion medially in the right breast on image 67 series 2. While the patient had a previous lumpectomy in the medial breast, this seems to me to be substantially more medial than the reported lumpectomy tumor, and also closer to the pectoralis muscle. IMPRESSION: 1. 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 Pathologic mediastinal adenopathy. 2. Nodular liver, shown to be due to multiple masses on recent abdominal CT. 3. Soft tissue mass far medially in the right breast anterior to the right edge of the sternum. Although the patient does have a history of lumpectomy and lumpectomy scar sites can appear similar to this lesion, the lesion seems to me to be substantially further medial to the site of prior breast mass and as such could represent new tumor or recurrence. Once no longer acutely ill and on an outpatient basis, nuclear medicine PET-CT may be helpful in further assessment and further staging workup. 4. Other imaging findings of potential clinical significance: Aortic Atherosclerosis (ICD10-I70.0) and Emphysema (ICD10-J43.9). Coronary atherosclerosis. Electronically Signed   By: Van Clines M.D.   On: 12/15/2020 16:07   MR Brain W Wo Contrast  Result Date: 01/02/2021 CLINICAL DATA:  Metastatic disease evaluation EXAM: MRI HEAD WITHOUT AND WITH CONTRAST TECHNIQUE: Multiplanar, multiecho pulse sequences of the brain and surrounding structures were obtained without and with intravenous  contrast. CONTRAST:  62m GADAVIST GADOBUTROL 1 MMOL/ML IV SOLN COMPARISON:  MRI of the brain Jan 09, 2020 FINDINGS: Brain: No acute infarction, hemorrhage, hydrocephalus, extra-axial collection or mass lesion. 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 Vascular: Normal flow voids. Skull and upper cervical spine: Decrease of the T1 signal within the visualized upper cervical spine and clivus, new from prior MRI. Secondary involvement cannot be excluded. Sinuses/Orbits: Negative. Other: Minimal right mastoid effusion IMPRESSION: 1. No evidence of intracranial metastatic disease. 2. 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 3. Decrease of the T1 signal within the visualized upper cervical spine and clivus, new from prior MRI. Secondary involvement cannot be excluded. Electronically Signed   By: Pedro Earls M.D.   On: 01/02/2021 14:39   CT ABDOMEN PELVIS W CONTRAST  Result Date: 12/14/2020 CLINICAL DATA:  75 year old female with abdominal pain. EXAM: CT ABDOMEN AND PELVIS WITH CONTRAST TECHNIQUE: Multidetector CT imaging of the abdomen and pelvis was performed using the standard protocol following bolus administration of intravenous contrast. CONTRAST:  182m OMNIPAQUE IOHEXOL 300 MG/ML  SOLN COMPARISON:  CT abdomen pelvis dated 08/16/2020. FINDINGS: Lower chest: The visualized lung bases are clear. Coronary vascular calcifications noted. No intra-abdominal free air or free fluid. Hepatobiliary: Innumerable hypoenhancing lesions in the liver measuring up to 5 cm, new since the prior CT consistent with metastatic disease. There is irregularity and nodularity of the liver contour which may represent cirrhosis or pseudo cirrhosis. No intrahepatic biliary ductal dilatation. Cholecystectomy. No retained calcified stone noted in the central CBD. Pancreas: Unremarkable. No pancreatic ductal dilatation or surrounding inflammatory changes. Spleen: Normal in size  without focal abnormality. Adrenals/Urinary Tract: The adrenal glands unremarkable. There is no hydronephrosis on either side. There is apparent faint heterogeneous nephrogram in the inferior pole of the right kidney. Correlation with urinalysis recommended to exclude pyelonephritis. No drainable fluid collection or abscess. The visualized ureters and urinary bladder appear unremarkable. Stomach/Bowel: There is sigmoid diverticulosis and scattered colonic diverticula without active inflammatory changes. There is no bowel obstruction or active inflammation. The appendix is normal. Vascular/Lymphatic: Advanced aortoiliac atherosclerotic disease. The IVC is unremarkable. No portal venous gas. There is no adenopathy. Reproductive: Hysterectomy.  No adnexal masses. Other: None Musculoskeletal: Degenerative changes of the spine. No acute osseous pathology. IMPRESSION: 1. Innumerable hepatic metastatic disease, new since the prior CT. 2. Colonic diverticulosis. No bowel obstruction. Normal appendix. 3. Faint heterogeneous nephrogram in the inferior pole of the right kidney. Correlation with urinalysis recommended to exclude pyelonephritis. No drainable fluid collection or abscess. 4. Aortic Atherosclerosis (ICD10-I70.0). Electronically Signed   By: AAnner CreteM.D.   On: 12/14/2020 22:57   NM PET Image Initial (PI) Skull Base To Thigh  Result Date: 01/02/2021 CLINICAL DATA:  Initial treatment strategy for lung mass. EXAM: NUCLEAR MEDICINE PET SKULL BASE TO THIGH TECHNIQUE: 9.2 mCi F-18 FDG was injected intravenously. Full-ring PET imaging was performed from the skull base to thigh after the radiotracer. CT data was obtained and used for attenuation correction and anatomic localization. Fasting blood glucose: 120 mg/dl COMPARISON:  Chest CT 12/15/2020.  Abdomen/pelvis CT 12/14/2020. FINDINGS: Mediastinal blood pool activity: SUV max 1.9 Liver activity: SUV max NA NECK: No hypermetabolic lymph nodes in the neck.  Incidental CT findings: none CHEST: Hypermetabolic mediastinal lymphadenopathy noted. 2.3 cm short axis index right paratracheal node on 84/3 demonstrates SUV max = 10.7. Right apical lung lesion is also markedly hypermetabolic with SUV max = 116.1 1.5 cm suprahilar right upper lobe nodule (809/6 is hypermetabolic with SUV max = 5.2. The irregular soft tissue lesion in the medial right parasternal breast shows only low level FDG  uptake with SUV max = 1.4. Incidental CT findings: Coronary artery calcification is evident. Atherosclerotic calcification is noted in the wall of the thoracic aorta. Centrilobular emphsyema noted. ABDOMEN/PELVIS: The liver shows diffuse hypermetabolism which is relatively confluent in no discrete hypermetabolic lesions are evident. Index region in the right liver demonstrates SUV max = 8.6. Features are consistent with the diffuse metastatic disease seen on the recent diagnostic CT scan. Hepatoduodenal ligament lymphadenopathy is hypermetabolic with SUV max = 8.5. No other suspicious hypermetabolic soft tissue disease in the abdomen or pelvis. Incidental CT findings: Gallbladder surgically absent. There is abdominal aortic atherosclerosis without aneurysm. Left colonic diverticulosis noted without diverticulitis. SKELETON: Widespread skeletal hypermetabolic metastases are noted in the thoracolumbar spine, sternum, medial left clavicle. Humeral heads, and bony pelvis. Subtle sclerosis is seen in some of these hypermetabolic regions, but largely, the hypermetabolic bone disease shows no underlying lytic or sclerotic abnormality. Index hypermetabolic lesion posterior left iliac bone adjacent to the SI joint demonstrates SUV max = 6.6. Hypermetabolic lesion in the L5 vertebral body demonstrates SUV max = none. Incidental CT findings: none IMPRESSION: 1. Markedly hypermetabolic right apical lung lesion with probable hypermetabolic satellite nodule in the right suprahilar lung. 2. Marked  hypermetabolism and mediastinal lymphadenopathy is consistent with metastatic disease. No hypermetabolic right hilar lymph nodes evident. 3. Widespread hypermetabolic liver metastases. 4. Hypermetabolic metastatic lymphadenopathy in the hepatoduodenal ligament/porta hepatis. 5. Widespread hypermetabolic bony metastases. 6. Medial right breast lesion identified on previous chest CT shows FDG uptake below blood pool activity, not suspicious for malignancy and likely represents scar/granulation. 7.  Aortic Atherosclerois (ICD10-170.0) Emphysema. ([id number]-U04.9) Electronically Signed   By: Misty Stanley M.D.   On: 01/02/2021 14:42   US BIOPSY (LIVER)  Result Date: 12/26/2020 INDICATION: Hepatic metastases, suspect lung primary EXAM: ULTRASOUND GUIDED CORE BIOPSY OF LEFT HEPATIC METASTASIS MEDICATIONS: 1% LIDOCAINE LOCAL ANESTHESIA/SEDATION: Versed 1.28m IV; Fentanyl 5106m IV; Moderate Sedation Time:  10 MINUTES The patient was continuously monitored during the procedure by the interventional radiology nurse under my direct supervision. FLUOROSCOPY TIME:  Fluoroscopy Time: NONE. COMPLICATIONS: None immediate. PROCEDURE: The procedure, risks, benefits, and alternatives were explained to the patient. Questions regarding the procedure were encouraged and answered. The patient understands and consents to the procedure. Previous imaging reviewed. Preliminary ultrasound performed. A left hepatic lesion in the subxiphoid area was localized and marked. Under sterile conditions and local anesthesia, a 17 gauge coaxial guide was advanced to the lesion. Needle position confirmed with ultrasound. Images obtained for documentation. 3 18 gauge core biopsies obtained under direct ultrasound. Samples were intact and non fragmented. These were placed in formalin. Needle tract occluded with Gel-Foam. Postprocedure imaging demonstrates no hemorrhage or hematoma. Patient tolerated biopsy well. FINDINGS: Imaging confirms needle placement  in a left hepatic lesion for core biopsy IMPRESSION: Successful ultrasound left hepatic metastasis 18 gauge core biopsy Electronically Signed   By: M.Jerilynn Mages Shick M.D.   On: 12/26/2020 11:56   DG Chest Portable 1 View  Result Date: 12/14/2020 CLINICAL DATA:  Shortness of breath EXAM: PORTABLE CHEST 1 VIEW COMPARISON:  None. FINDINGS: The heart size and mediastinal contours are within normal limits. Aortic knob calcifications are seen. Both lungs are clear. The visualized skeletal structures are unremarkable. IMPRESSION: No active disease. Electronically Signed   By: BiPrudencio Pair.D.   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   PLAN:    1. 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  MRI brain is negative.  Patient states she wishes to pursue treatment with combination immunotherapy and chemotherapy, but plans to leave for [place] in the next 1 to 2 days to live with her daughter and receive treatment there.  Recommended Tecentriq, carboplatinum, etoposide on day 1 with etoposide only on days 2 and 3 every 3 weeks for 4 cycles.  Patient will then receive maintenance Tecentriq every 3 weeks until progression of disease.  Once patient has oncology follow-up in [place], will forward imaging, laboratory results, and anything else needed to initiate treatment.  Patient was also instructed she will require port placement prior to treatment. 2.  Bony metastasis: Include Zometa with treatment as above. 3.  Abdominal distention: Unclear if related to ascites.  Recommended ultrasound with consideration of paracentesis, which patient declined. 4.  Back pain: Continue  Tylenol as needed.  Patient may require XRT if related to malignancy. 5.  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  She completed XRT in approximately January 2015. Patient could not tolerate letrozole, anastrozole, or Aromasin. She elected to discontinue all aromatase inhibitors despite knowing her risk of recurrence increases.  Patient's most recent mammogram on July 13, 2020 was reported as BI-RADS 1. 6.  Disposition: Patient moving to [place] in the next 24 to 48 hours.  If she is not unable to complete this trip, she will call clinic to arrange follow-up.   Patient expressed understanding and was in agreement with this plan. She also understands that She can call clinic at any time with any questions, concerns, or complaints.    Lloyd Huger, MD   01/03/2021 1:51 PM

## 2021-01-02 ENCOUNTER — Other Ambulatory Visit: Payer: Self-pay

## 2021-01-02 ENCOUNTER — Encounter
Admission: RE | Admit: 2021-01-02 | Discharge: 2021-01-02 | Disposition: A | Discharge status: Home / Self Care | Payer: Medicare HMO | Source: Ambulatory Visit | Attending: Oncology | Admitting: Oncology

## 2021-01-02 ENCOUNTER — Ambulatory Visit
Admission: RE | Admit: 2021-01-02 | Discharge: 2021-01-02 | Disposition: A | Discharge status: Home / Self Care | Payer: Medicare HMO | Source: Ambulatory Visit | Attending: Oncology | Admitting: Oncology

## 2021-01-02 DIAGNOSIS — C7951 Secondary malignant neoplasm of bone: Secondary | ICD-10-CM | POA: Diagnosis not present

## 2021-01-02 DIAGNOSIS — Z853 Personal history of malignant neoplasm of breast: Secondary | ICD-10-CM

## 2021-01-02 DIAGNOSIS — C787 Secondary malignant neoplasm of liver and intrahepatic bile duct: Secondary | ICD-10-CM | POA: Diagnosis not present

## 2021-01-02 DIAGNOSIS — R918 Other nonspecific abnormal finding of lung field: Secondary | ICD-10-CM | POA: Insufficient documentation

## 2021-01-02 DIAGNOSIS — I700 Atherosclerosis of aorta: Secondary | ICD-10-CM | POA: Insufficient documentation

## 2021-01-02 DIAGNOSIS — J439 Emphysema, unspecified: Secondary | ICD-10-CM | POA: Diagnosis not present

## 2021-01-02 DIAGNOSIS — R590 Localized enlarged lymph nodes: Secondary | ICD-10-CM | POA: Diagnosis not present

## 2021-01-02 LAB — GLUCOSE, CAPILLARY: Glucose-Capillary: 120 mg/dL — ABNORMAL HIGH (ref 70–99)

## 2021-01-02 MED ORDER — GADOBUTROL 1 MMOL/ML IV SOLN
8.0000 mL | Freq: Once | INTRAVENOUS | Status: AC | PRN
  Administered 2021-01-02: 8 mL via INTRAVENOUS

## 2021-01-02 MED ORDER — FLUDEOXYGLUCOSE F - 18 (FDG) INJECTION
9.1600 | Freq: Once | INTRAVENOUS | Status: AC | PRN
  Administered 2021-01-02: 9.16 via INTRAVENOUS

## 2021-01-03 ENCOUNTER — Inpatient Hospital Stay: Payer: Medicare HMO | Attending: Oncology | Admitting: Oncology

## 2021-01-03 ENCOUNTER — Encounter: Payer: Self-pay | Admitting: *Deleted

## 2021-01-03 ENCOUNTER — Encounter: Payer: Self-pay | Admitting: Oncology

## 2021-01-03 VITALS — BP 120/49 | HR 100 | Temp 98.0°F | Ht 64.0 in | Wt 176.6 lb

## 2021-01-03 DIAGNOSIS — C7A1 Malignant poorly differentiated neuroendocrine tumors: Secondary | ICD-10-CM | POA: Insufficient documentation

## 2021-01-03 DIAGNOSIS — Z853 Personal history of malignant neoplasm of breast: Secondary | ICD-10-CM | POA: Diagnosis not present

## 2021-01-03 DIAGNOSIS — C787 Secondary malignant neoplasm of liver and intrahepatic bile duct: Secondary | ICD-10-CM | POA: Diagnosis not present

## 2021-01-03 DIAGNOSIS — C7A8 Other malignant neuroendocrine tumors: Secondary | ICD-10-CM | POA: Diagnosis not present

## 2021-01-03 DIAGNOSIS — C7951 Secondary malignant neoplasm of bone: Secondary | ICD-10-CM | POA: Diagnosis not present

## 2021-01-03 DIAGNOSIS — Z923 Personal history of irradiation: Secondary | ICD-10-CM | POA: Diagnosis not present

## 2021-01-03 DIAGNOSIS — Z87891 Personal history of nicotine dependence: Secondary | ICD-10-CM | POA: Insufficient documentation

## 2021-01-03 NOTE — Progress Notes (Signed)
START ON PATHWAY REGIMEN - Small Cell Lung     Cycles 1 through 4, every 21 days:     Atezolizumab      Carboplatin      Etoposide    Cycles 5 and beyond, every 21 days:     Atezolizumab   **Always confirm dose/schedule in your pharmacy ordering system**  Patient Characteristics: Newly Diagnosed, Preoperative or Nonsurgical Candidate (Clinical Staging), First Line, Extensive Stage Therapeutic Status: Newly Diagnosed, Preoperative or Nonsurgical Candidate (Clinical Staging) AJCC T Category: cT2b AJCC N Category: cN1 AJCC M Category: pM1c AJCC 8 Stage Grouping: IVB Stage Classification: Extensive  Intent of Therapy: Non-Curative / Palliative Intent, Discussed with Patient

## 2021-01-03 NOTE — Progress Notes (Signed)
  Oncology Nurse Navigator Documentation  Navigator Location: [facility] (01/03/21 1100)   )Navigator Encounter Type: Follow-up Appt (01/03/21 1100)   Abnormal Finding Date: 12/14/20 (01/03/21 1100) Confirmed Diagnosis Date: 12/28/20 (01/03/21 1100)               Patient Visit Type: MedOnc (01/03/21 1100) Treatment Phase: Pre-Tx/Tx Discussion (01/03/21 1100) Barriers/Navigation Needs: Coordination of Care (01/03/21 1100)   Interventions: Coordination of Care (01/03/21 1100)   Coordination of Care: Appts (01/03/21 1100)        Acuity: Level 2-Minimal Needs (1-2 Barriers Identified) (01/03/21 1100)    met with patient and her son during follow up visit with Dr. Grayland Ormond to discuss results and treatment options. All questions answered during visit. Pt given resources regarding diagnosis and supportive services available. Pt and her son wish to transfer her care to [place] for pt to have better support system during treatment. Contact info given and instructed to call if needs any further assistance with transfer of care. Pt and her son verbalized understanding. Nothing further needed at this time.     Time Spent with Patient: 60 (01/03/21 1100)

## 2021-01-04 ENCOUNTER — Encounter: Payer: Self-pay | Admitting: *Deleted

## 2021-01-04 NOTE — Progress Notes (Signed)
  Oncology Nurse Navigator Documentation  Navigator Location: [facility] (01/04/21 1400)   )Navigator Encounter Type: Letter/Fax/Email (01/04/21 1400) Telephone: Incoming Call (01/04/21 1400)                       Barriers/Navigation Needs: Coordination of Care (01/04/21 1400)   Interventions: Coordination of Care;Referrals (01/04/21 1400) Referrals: Other (transfer of care to [facility]) (01/04/21 1400) Coordination of Care: Appts (01/04/21 1400)         received call from pt's son asking for records be faxed over to [facility] in [place]. Records faxed to [phone] for urgent referral to [facility] location. Informed son that will have hard copy of records plus imaging disc that she can take with her when she travels to [facility] on Friday. Son stated will pick up tomorrow.          Time Spent with Patient: 45 (01/04/21 1400)

## 2021-01-05 ENCOUNTER — Telehealth: Payer: Self-pay | Admitting: *Deleted

## 2021-01-05 NOTE — Telephone Encounter (Signed)
Lovena Le with [facility] called requesting a return call to discuss the referral process for this patient

## 2021-01-05 NOTE — Telephone Encounter (Signed)
Call returned.

## 2021-01-09 ENCOUNTER — Encounter (INDEPENDENT_AMBULATORY_CARE_PROVIDER_SITE_OTHER): Payer: Medicare HMO

## 2021-01-09 ENCOUNTER — Ambulatory Visit (INDEPENDENT_AMBULATORY_CARE_PROVIDER_SITE_OTHER): Payer: Medicare HMO | Admitting: Vascular Surgery

## 2021-01-09 ENCOUNTER — Encounter: Payer: Self-pay | Admitting: *Deleted

## 2021-01-26 ENCOUNTER — Ambulatory Visit: Payer: Medicare HMO | Admitting: Oncology

## 2021-07-14 ENCOUNTER — Encounter: Payer: Self-pay | Admitting: Oncology

## 2021-12-09 DEATH — deceased

## 2022-07-09 ENCOUNTER — Encounter (INDEPENDENT_AMBULATORY_CARE_PROVIDER_SITE_OTHER): Payer: Self-pay

## 2022-09-24 IMAGING — MG DIGITAL SCREENING BILAT W/ TOMO W/ CAD
5 series · 6 of 13 positions shown · non-contrast
Comparison: Previous exam(s).

CLINICAL DATA: Screening.

EXAM:
DIGITAL SCREENING BILATERAL MAMMOGRAM WITH TOMO AND CAD

[R CC synth-2D]
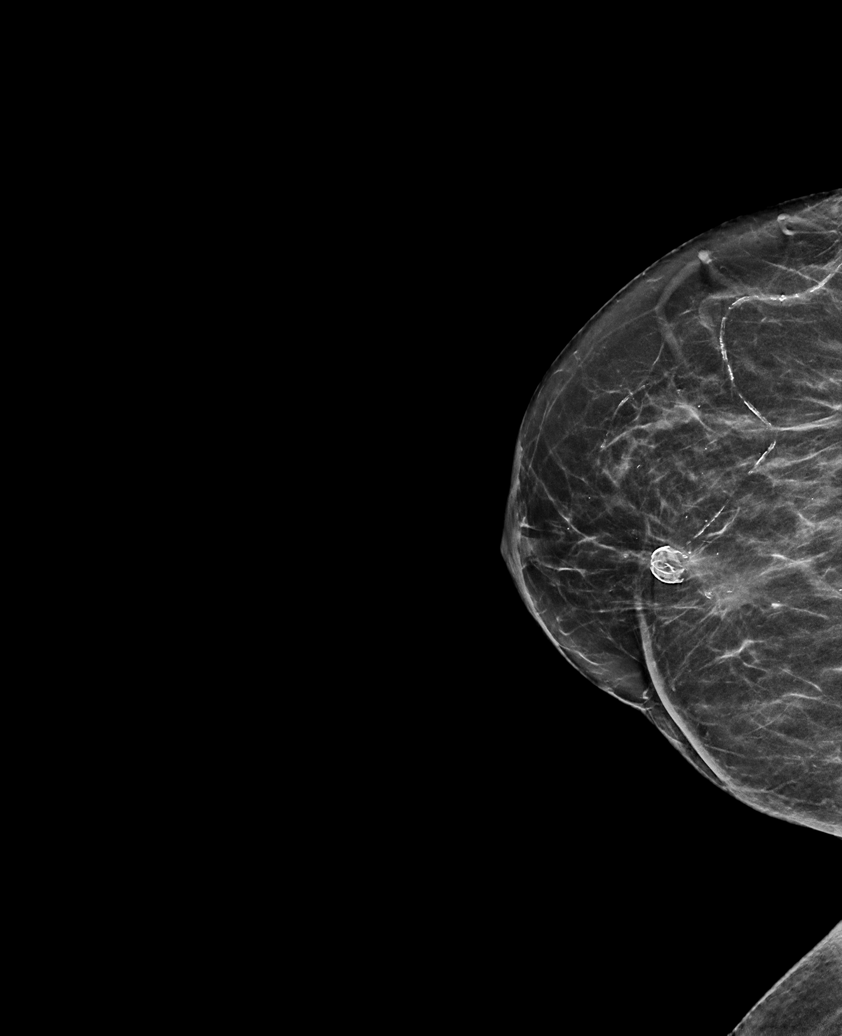

[L MLO synth-2D]
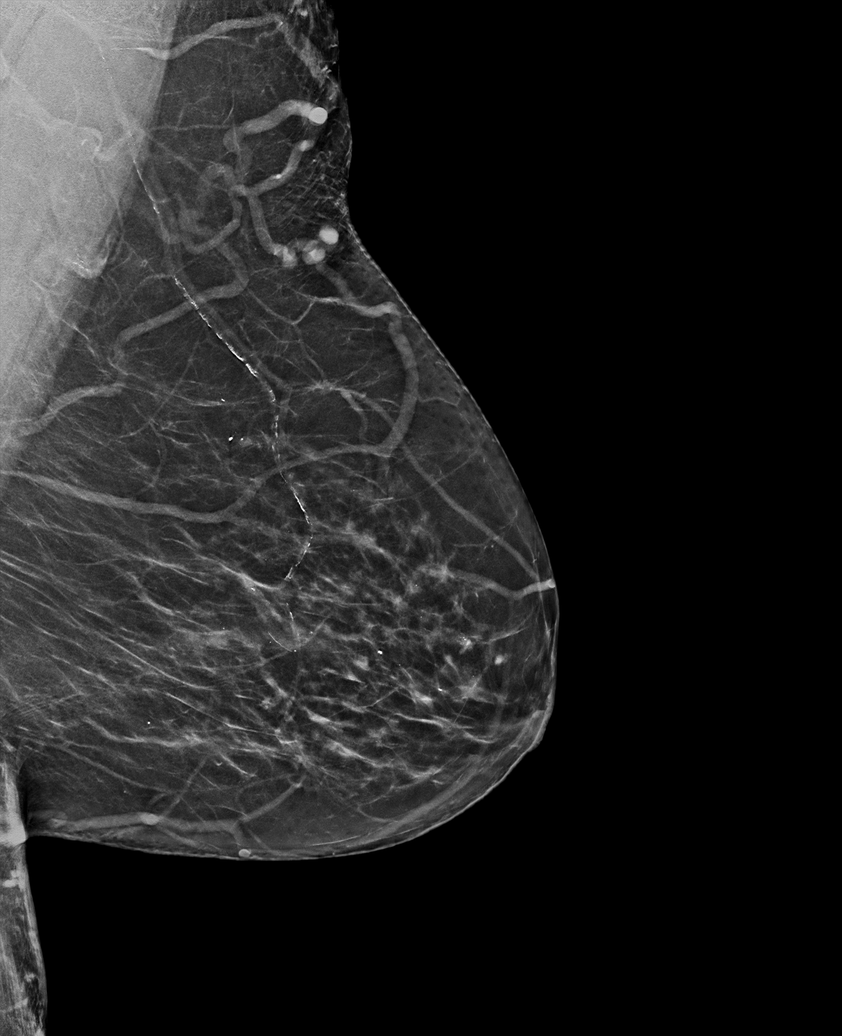

[L CC synth-2D]
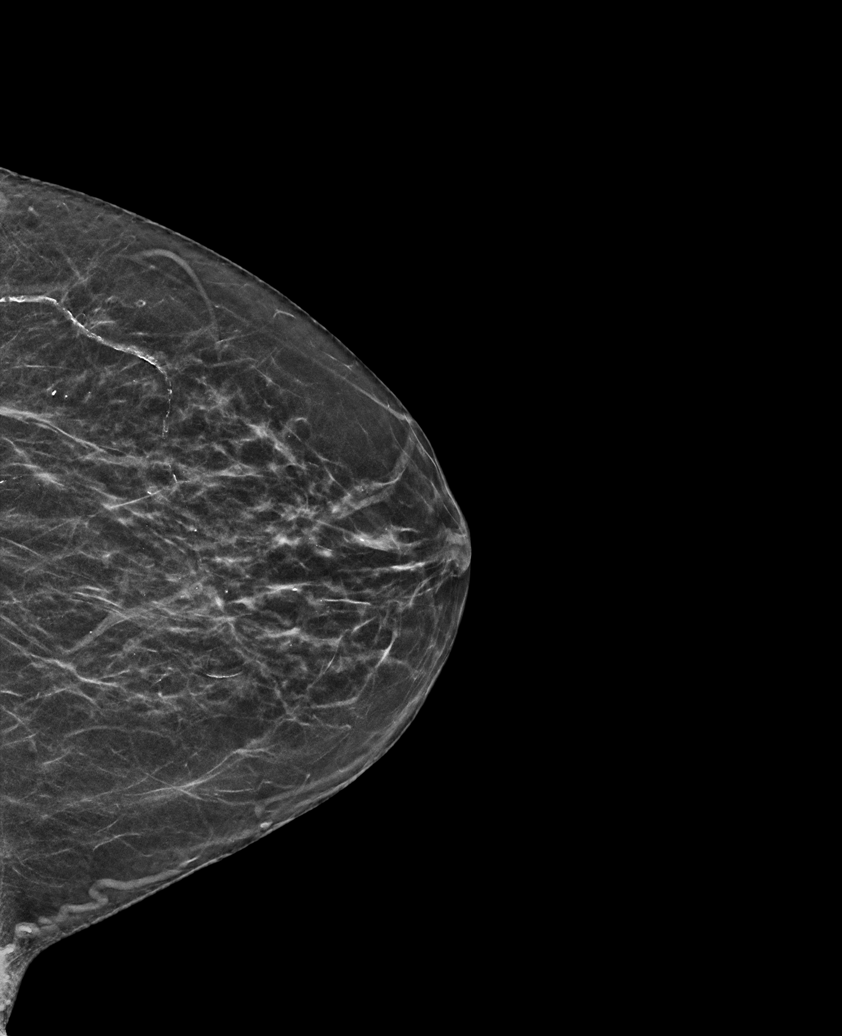

[R MLO tomo · 2 of 59 frames shown]
[frame 20/59]
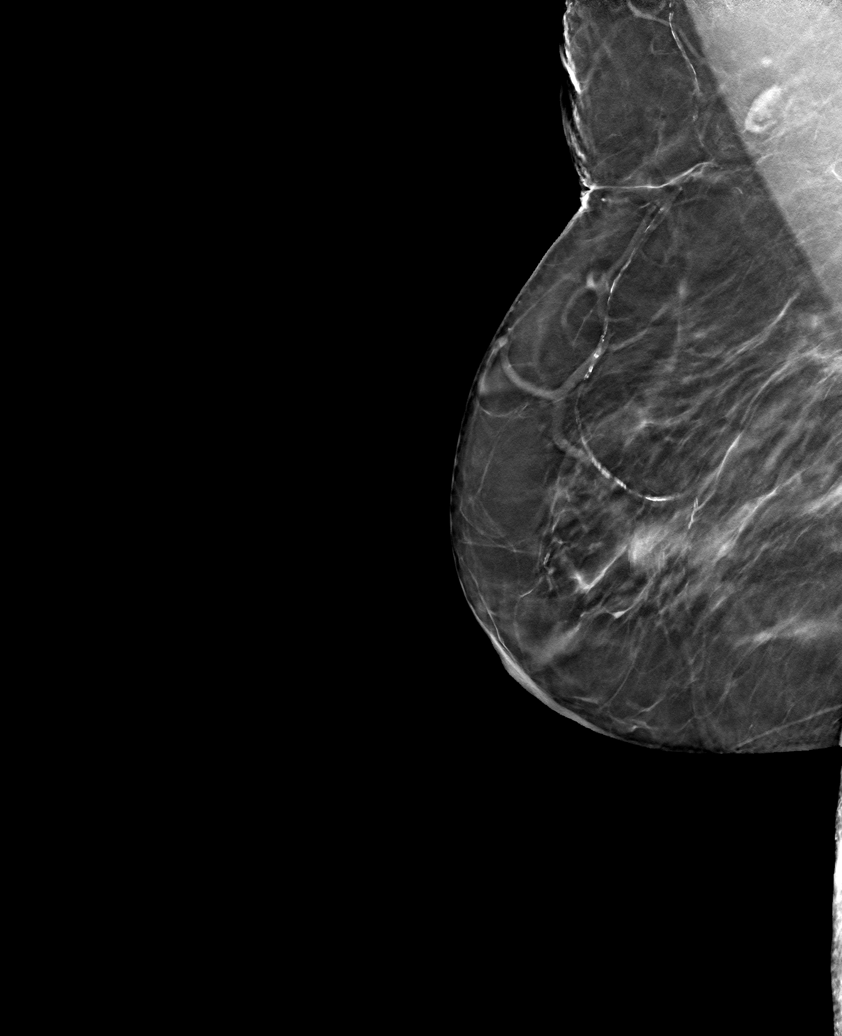
[frame 30/59]
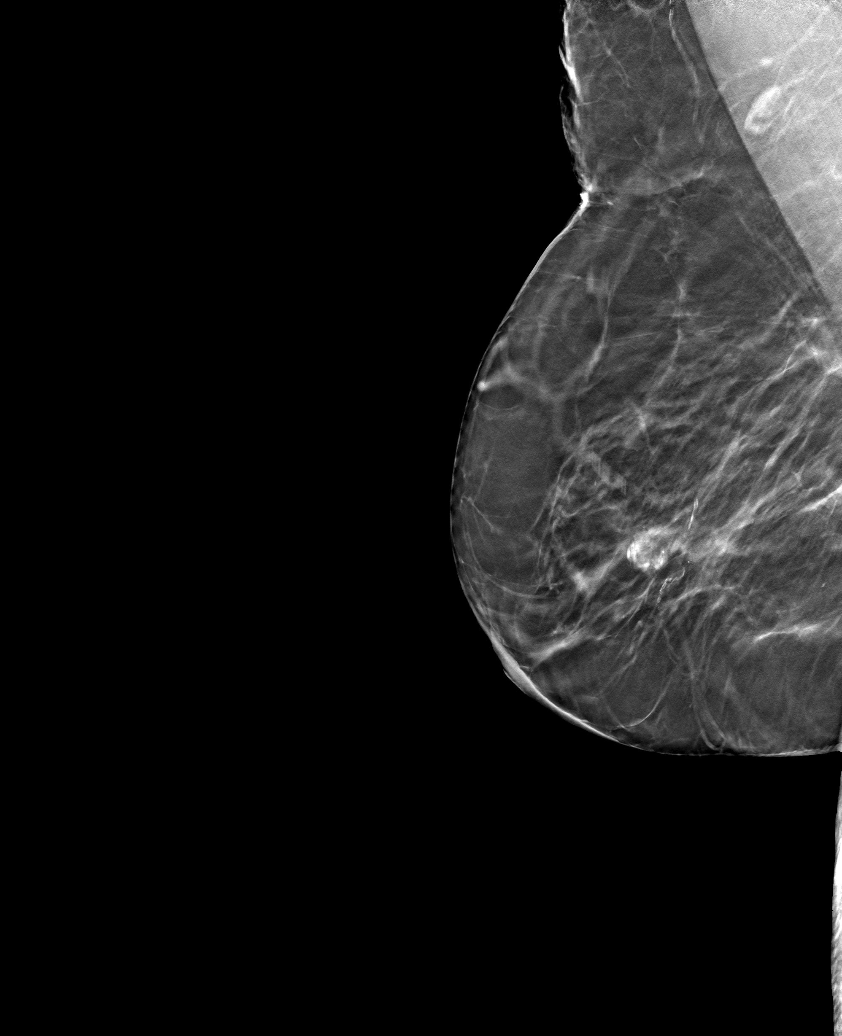

[L MLO tomo · tomo slice 31/62.0]
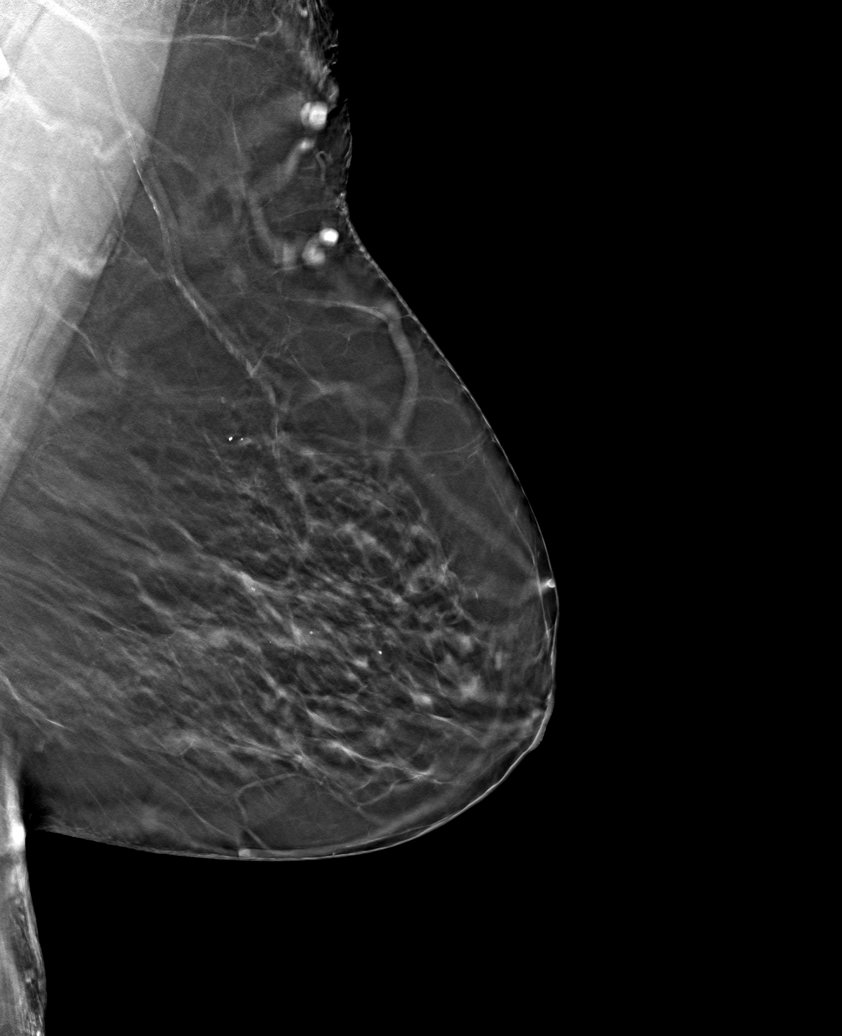

[6 of 13 positions shown; findings below may reference images not displayed]

ACR Breast Density Category b: There are scattered areas of
fibroglandular density.
FINDINGS: There are no findings suspicious for malignancy. Images were
processed with CAD.
IMPRESSION: No mammographic evidence of malignancy. A result letter of this
screening mammogram will be mailed directly to the patient.

RECOMMENDATION:
Screening mammogram in one year. (Code:[id number])

BI-RADS CATEGORY  1: Negative.
